# Patient Record
Sex: Female | Born: 1970 | Race: White | Hispanic: No | Marital: Married | Smoking: Former smoker
Health system: Southern US, Community
[De-identification: ages and names within clinical notes are randomized; demographics above are authoritative.]

## PROBLEM LIST (undated history)

## (undated) DIAGNOSIS — F419 Anxiety disorder, unspecified: Secondary | ICD-10-CM

## (undated) DIAGNOSIS — F32A Depression, unspecified: Secondary | ICD-10-CM

## (undated) DIAGNOSIS — I10 Essential (primary) hypertension: Secondary | ICD-10-CM

## (undated) DIAGNOSIS — D649 Anemia, unspecified: Secondary | ICD-10-CM

## (undated) DIAGNOSIS — F329 Major depressive disorder, single episode, unspecified: Secondary | ICD-10-CM

## (undated) DIAGNOSIS — F1010 Alcohol abuse, uncomplicated: Secondary | ICD-10-CM

## (undated) DIAGNOSIS — J45909 Unspecified asthma, uncomplicated: Secondary | ICD-10-CM

## (undated) DIAGNOSIS — B159 Hepatitis A without hepatic coma: Secondary | ICD-10-CM

## (undated) HISTORY — DX: Essential (primary) hypertension: I10

## (undated) HISTORY — DX: Depression, unspecified: F32.A

## (undated) HISTORY — DX: Anxiety disorder, unspecified: F41.9

## (undated) HISTORY — PX: UPPER GASTROINTESTINAL ENDOSCOPY: SHX188

## (undated) HISTORY — PX: TONSILLECTOMY AND ADENOIDECTOMY: SUR1326

## (undated) HISTORY — DX: Major depressive disorder, single episode, unspecified: F32.9

## (undated) HISTORY — PX: BREAST LUMPECTOMY: SHX2

## (undated) HISTORY — DX: Alcohol abuse, uncomplicated: F10.10

---

## 2004-12-10 ENCOUNTER — Other Ambulatory Visit: Admission: RE | Admit: 2004-12-10 | Discharge: 2004-12-10 | Payer: Self-pay | Admitting: Obstetrics and Gynecology

## 2005-11-10 ENCOUNTER — Encounter: Admission: RE | Admit: 2005-11-10 | Discharge: 2005-11-10 | Payer: Self-pay | Admitting: Obstetrics and Gynecology

## 2005-11-23 ENCOUNTER — Encounter: Admission: RE | Admit: 2005-11-23 | Discharge: 2005-11-23 | Payer: Self-pay | Admitting: General Surgery

## 2005-11-23 ENCOUNTER — Encounter (INDEPENDENT_AMBULATORY_CARE_PROVIDER_SITE_OTHER): Payer: Self-pay | Admitting: *Deleted

## 2005-12-21 ENCOUNTER — Other Ambulatory Visit: Admission: RE | Admit: 2005-12-21 | Discharge: 2005-12-21 | Payer: Self-pay | Admitting: Obstetrics and Gynecology

## 2006-01-07 ENCOUNTER — Ambulatory Visit: Payer: Self-pay | Admitting: Internal Medicine

## 2006-01-14 ENCOUNTER — Ambulatory Visit (HOSPITAL_BASED_OUTPATIENT_CLINIC_OR_DEPARTMENT_OTHER): Admission: RE | Admit: 2006-01-14 | Discharge: 2006-01-14 | Payer: Self-pay | Admitting: General Surgery

## 2006-01-14 ENCOUNTER — Encounter: Admission: RE | Admit: 2006-01-14 | Discharge: 2006-01-14 | Payer: Self-pay | Admitting: General Surgery

## 2006-01-14 ENCOUNTER — Encounter (INDEPENDENT_AMBULATORY_CARE_PROVIDER_SITE_OTHER): Payer: Self-pay | Admitting: Specialist

## 2006-02-19 ENCOUNTER — Ambulatory Visit: Payer: Self-pay | Admitting: Internal Medicine

## 2007-06-28 ENCOUNTER — Encounter: Payer: Self-pay | Admitting: *Deleted

## 2007-06-28 DIAGNOSIS — I10 Essential (primary) hypertension: Secondary | ICD-10-CM | POA: Insufficient documentation

## 2008-05-22 ENCOUNTER — Emergency Department (HOSPITAL_COMMUNITY): Admission: EM | Admit: 2008-05-22 | Discharge: 2008-05-22 | Payer: Self-pay | Admitting: Emergency Medicine

## 2009-06-20 ENCOUNTER — Encounter: Payer: Self-pay | Admitting: Family Medicine

## 2009-06-26 ENCOUNTER — Ambulatory Visit: Payer: Self-pay | Admitting: Family Medicine

## 2009-06-26 DIAGNOSIS — J309 Allergic rhinitis, unspecified: Secondary | ICD-10-CM | POA: Insufficient documentation

## 2009-06-26 DIAGNOSIS — F1010 Alcohol abuse, uncomplicated: Secondary | ICD-10-CM | POA: Insufficient documentation

## 2009-06-26 DIAGNOSIS — R5383 Other fatigue: Secondary | ICD-10-CM | POA: Insufficient documentation

## 2009-07-03 ENCOUNTER — Encounter: Payer: Self-pay | Admitting: Family Medicine

## 2009-07-25 ENCOUNTER — Ambulatory Visit: Payer: Self-pay | Admitting: Family Medicine

## 2009-07-25 DIAGNOSIS — R319 Hematuria, unspecified: Secondary | ICD-10-CM | POA: Insufficient documentation

## 2009-07-25 LAB — CONVERTED CEMR LAB
Bilirubin Urine: NEGATIVE
Glucose, Urine, Semiquant: NEGATIVE
Ketones, urine, test strip: NEGATIVE
Nitrite: NEGATIVE
Specific Gravity, Urine: <1.005
Urobilinogen, UA: 0.2
pH: 7

## 2009-07-26 ENCOUNTER — Encounter: Payer: Self-pay | Admitting: Family Medicine

## 2009-08-20 ENCOUNTER — Encounter (INDEPENDENT_AMBULATORY_CARE_PROVIDER_SITE_OTHER): Payer: Self-pay | Admitting: *Deleted

## 2009-08-23 ENCOUNTER — Encounter (INDEPENDENT_AMBULATORY_CARE_PROVIDER_SITE_OTHER): Payer: Self-pay | Admitting: *Deleted

## 2009-08-27 ENCOUNTER — Telehealth: Payer: Self-pay | Admitting: Family Medicine

## 2010-01-01 ENCOUNTER — Ambulatory Visit: Payer: Self-pay | Admitting: Family Medicine

## 2010-01-01 DIAGNOSIS — R6883 Chills (without fever): Secondary | ICD-10-CM | POA: Insufficient documentation

## 2010-01-01 LAB — CONVERTED CEMR LAB
Bilirubin Urine: NEGATIVE
Blood in Urine, dipstick: NEGATIVE
Glucose, Urine, Semiquant: NEGATIVE
Nitrite: NEGATIVE
Specific Gravity, Urine: 1.01
Urobilinogen, UA: 0.2
WBC Urine, dipstick: NEGATIVE
pH: 8.5

## 2010-01-23 ENCOUNTER — Encounter: Admission: RE | Admit: 2010-01-23 | Discharge: 2010-01-23 | Payer: Self-pay | Admitting: Obstetrics and Gynecology

## 2010-01-30 ENCOUNTER — Encounter: Admission: RE | Admit: 2010-01-30 | Discharge: 2010-01-30 | Payer: Self-pay | Admitting: Obstetrics and Gynecology

## 2010-04-05 LAB — HM MAMMOGRAPHY: HM Mammogram: NEGATIVE

## 2010-05-12 ENCOUNTER — Telehealth: Payer: Self-pay | Admitting: Family Medicine

## 2010-09-05 LAB — HM PAP SMEAR: HM Pap smear: NEGATIVE

## 2010-11-04 NOTE — Assessment & Plan Note (Signed)
Summary: BLOOD IN URINE//SLM   Vital Signs:  Patient profile:   40 year old female Menstrual status:  regular Temp:     97.5 degrees F oral BP sitting:   150 / 90  (left arm) Cuff size:   regular  Vitals Entered By: Sid Falcon LPN (July 25, 2009 10:07 AM) CC: Blood in urine today   History of Present Illness: Chief complaint is gross hematuria which she noticed couple days ago. She's had some nonspecific symptoms of achiness and malaise. Similar symptoms in the past with urinary infection. She has not had any urine frequency or burning with urination and denies any nausea, vomiting, fever, or back pain. No suprapubic pain. No history of kidney stones. No history of bruising or other bleeding complications.  Recently started on several allergy medications. These are reviewed. History of allergy to sulfa and Macrobid per patient.  Allergies: 1)  ! Sulfa  Past History:  Past Medical History: Last updated: 06/26/2009 Anemia Alcohol/Drug problem Hay fever, allergies Hepatitis/Jaundice  (infant)  Review of Systems      See HPI  Physical Exam  General:  Well-developed,well-nourished,in no acute distress; alert,appropriate and cooperative throughout examination Mouth:  Oral mucosa and oropharynx without lesions or exudates.  Teeth in good repair. Lungs:  Normal respiratory effort, chest expands symmetrically. Lungs are clear to auscultation, no crackles or wheezes. Heart:  Normal rate and regular rhythm. S1 and S2 normal without gallop, murmur, click, rub or other extra sounds. Msk:  no CVA tenderness   Impression & Recommendations:  Problem # 1:  HEMATURIA UNSPECIFIED (ICD-599.70)  obtain urine culture. Empirically start antibiotic though not clear that this is urinary infection related. If culture negative will need further urologic workup. Her updated medication list for this problem includes:    Metronidazole 500 Mg Tabs (Metronidazole) .Marland Kitchen..Marland Kitchen Two times a day  Ciprofloxacin Hcl 250 Mg Tabs (Ciprofloxacin hcl) ..... One by mouth two times a day for 5 days  Orders: UA Dipstick w/o Micro (manual) (16109) T-Urine Culture (Spectrum Order) [id number])  Complete Medication List: 1)  Benazepril-hydrochlorothiazide 20-25 Mg Tabs (Benazepril-hydrochlorothiazide) .... Once daily 2)  Lexapro 10 Mg Tabs (Escitalopram oxalate) .... 1/2  as needed 3)  Clonazepam 1 Mg Tabs (Clonazepam) .Marland Kitchen.. 1 to 1/2 as needed 4)  Metronidazole 500 Mg Tabs (Metronidazole) .... Two times a day 5)  Advair Hfa 115-21 Mcg/act Aero (Fluticasone-salmeterol) .... 2 sprays to each nostril daily 6)  Mucinex 600 Mg Xr12h-tab (Guaifenesin) .... As needed 7)  Claritin 10 Mg Tabs (Loratadine) .... Once daily 8)  Fluticasone Propionate 50 Mcg/act Susp (Fluticasone propionate) .... As needed 9)  Ciprofloxacin Hcl 250 Mg Tabs (Ciprofloxacin hcl) .... One by mouth two times a day for 5 days  Patient Instructions: 1)  Drink lots of fluids. 2)  We will call regarding urine culture results by Monday. If culture negative we will consider urology referral. Prescriptions: CIPROFLOXACIN HCL 250 MG TABS (CIPROFLOXACIN HCL) one by mouth two times a day for 5 days  #10 x 0   Entered and Authorized by:   Evelena Peat MD   Signed by:   Evelena Peat MD on 07/25/2009   Method used:   [facility] [address] [phone]* (retail)       [address] [place] [address]       [place], [place]  [address]       Ph: [phone] or [phone]  Fax: [phone]   RxID:   [id number]   Laboratory Results   Urine Tests    Routine Urinalysis   Color: yellow Appearance: Clear Glucose: negative   (Normal Range: Negative) Bilirubin: negative   (Normal Range: Negative) Ketone: negative   (Normal Range: Negative) Spec. Gravity: <1.005   (Normal Range: 1.003-1.035) Blood: large   (Normal Range: Negative) pH: 7.0   (Normal Range: 5.0-8.0) Protein: trace   (Normal  Range: Negative) Urobilinogen: 0.2   (Normal Range: 0-1) Nitrite: negative   (Normal Range: Negative) Leukocyte Esterace: trace   (Normal Range: Negative)

## 2010-11-04 NOTE — Assessment & Plan Note (Signed)
Summary: NEW PT/REFERRED/RCD   Vital Signs:  Patient profile:   40 year old female Menstrual status:  regular LMP:     06/05/2009 Height:      65.5 inches Weight:      136 pounds BMI:     22.37 Temp:     98.6 degrees F oral Pulse rate:   100 / minute Pulse rhythm:   regular Resp:     12 per minute BP sitting:   120 / 70  (left arm) Cuff size:   regular  Vitals Entered By: Sid Falcon LPN (June 26, 2009 3:52 PM) CC: Referral from Dr Huntley Dec, STD questions, not feeling well X 2 years LMP (date): 06/05/2009     Menstrual Status regular Enter LMP: 06/05/2009   History of Present Illness: New patient to establish care. Previously seen a local urgent care. Gives a two-year history of multiple progressive symptoms including watery itchy eyes, sore throat, mostly nonproductive cough, nasal congestion, fatigue, and recurrent sinus infections. 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 She suspects she may be allergic to something in the building. She has never had any allergy testing. She has taken multiple antihistamines and nasal steroids without much improvement.  Other medical problems include history of hypertension, excessive alcohol use, remote history of anemia, history of mild depression. She had lumpectomy 2007 for benign mass. Allergy to sulfa and Macrobid.  Patient smokes one pack of cigarettes per day. Excessive alcohol use with frequently consuming wine, beer, and bourbon in the same day. She desires to scale back and hopefully quit.  Preventive Screening-Counseling & Management  Alcohol-Tobacco     Smoking Status: current  Allergies: 1)  ! Sulfa  Past History:  Family History: Last updated: 06/26/2009 Family History Depression Family History of Alcoholism/Addiction Family History of Arthritis Family History of Stroke M 1st degree relative <50 Family History Emotional/Mental Illness  Social History: Last updated:  06/26/2009 Occupation:  Paralegal Married Current Smoker Alcohol use-yes  Risk Factors: Smoking Status: current (06/26/2009)  Past Medical History: Anemia Alcohol/Drug problem Hay fever, allergies Hepatitis/Jaundice  (infant)  Past Surgical History: Lumpectomy 2007  Family History: Family History Depression Family History of Alcoholism/Addiction Family History of Arthritis Family History of Stroke M 1st degree relative <50 Family History Emotional/Mental Illness  Social History: Occupation:  IT consultant Married Current Smoker Alcohol use-yes Occupation:  employed  Review of Systems  The patient denies anorexia, fever, weight loss, weight gain, hoarseness, chest pain, syncope, peripheral edema, hemoptysis, abdominal pain, melena, and hematochezia.    Physical Exam  General:  Well-developed,well-nourished,in no acute distress; alert,appropriate and cooperative throughout examination Eyes:  No corneal or conjunctival inflammation noted. EOMI. Perrla. Funduscopic exam benign, without hemorrhages, exudates or papilledema. Vision grossly normal. Ears:  External ear exam shows no significant lesions or deformities.  Otoscopic examination reveals clear canals, tympanic membranes are intact bilaterally without bulging, retraction, inflammation or discharge. Hearing is grossly normal bilaterally. Mouth:  poor dentition.  Nicotine staining of teeth.  Prominant papillae tongue from tobacco use but no lesions. Neck:  No deformities, masses, or tenderness noted. Lungs:  Normal respiratory effort, chest expands symmetrically. Lungs are clear to auscultation, no crackles or wheezes. Heart:  Normal rate and regular rhythm. S1 and S2 normal without gallop, murmur, click, rub or other extra sounds. Abdomen:  soft, non-tender, no distention, no masses, no guarding, no rebound tenderness, no hepatomegaly, and no splenomegaly.   Extremities:  no edema. Neurologic:  alert & oriented  X3, cranial  nerves II-XII intact, strength normal in all extremities, and gait normal.   Psych:  Oriented X3, normally interactive, not depressed appearing, and not agitated.     Impression & Recommendations:  Problem # 1:  ALLERGIC RHINITIS (ICD-477.9) possible occupational exposure. Recommend referral to allergist to help sort out possible allergens. She has multiple ongoing symptoms in spite of medication. Orders: Allergy Referral  (Allergy)  Her updated medication list for this problem includes:    Claritin 10 Mg Tabs (Loratadine) ..... Once daily    Fluticasone Propionate 50 Mcg/act Susp (Fluticasone propionate) .Marland Kitchen... As needed  Problem # 2:  FATIGUE (ICD-780.79) Per patient recent lab work done through urgent care all with chest x-ray and EKG we will send for those old records.  Problem # 3:  ALCOHOL ABUSE (ICD-305.00) long discussion with the husband and patient regarding adverse effects of excessive alcohol consumption. She is aware that she must discontinue use altogether. We've offered any support.  Complete Medication List: 1)  Benazepril-hydrochlorothiazide 20-25 Mg Tabs (Benazepril-hydrochlorothiazide) .... Once daily 2)  Lexapro 10 Mg Tabs (Escitalopram oxalate) .... 1/2  as needed 3)  Clonazepam 1 Mg Tabs (Clonazepam) .Marland Kitchen.. 1 to 1/2 as needed 4)  Metronidazole 500 Mg Tabs (Metronidazole) .... Two times a day 5)  Advair Hfa 115-21 Mcg/act Aero (Fluticasone-salmeterol) .... 2 sprays to each nostril daily 6)  Mucinex 600 Mg Xr12h-tab (Guaifenesin) .... As needed 7)  Claritin 10 Mg Tabs (Loratadine) .... Once daily 8)  Fluticasone Propionate 50 Mcg/act Susp (Fluticasone propionate) .... As needed  Patient Instructions: 1)  You need to stop alcohol consumption altogether. 2)  Please schedule a follow-up appointment in 1 month.   Preventive Care Screening     Mammogram 2007

## 2010-11-04 NOTE — Assessment & Plan Note (Signed)
Summary: UTI/dm   Vital Signs:  Patient profile:   40 year old female Menstrual status:  regular Temp:     97.7 degrees F oral BP sitting:   130 / 90  (left arm) Cuff size:   regular  Vitals Entered By: Sid Falcon LPN (January 01, 2010 2:00 PM) CC: UTI symptoms X 2 days   History of Present Illness: Patient seen with nonspecific symptoms of possible chills and low-grade fever last night. Generalized malaise. Concern for possible UTI though she has not had any urinary symptoms of frequency or burning. Denies any sore throat, sinus congestion, earache, abdominal pain, rash, nausea, vomiting, or diarrhea. Rare cough. No dyspnea.  Scalling back on nicotine.  Has greatly reduced ETOH recently but still drinking 2 alcohol beverages/day.  Allergies: 1)  ! Sulfa 2)  Macrobid (Nitrofurantoin Monohyd Macro)  Past History:  Past Medical History: Last updated: 06/26/2009 Anemia Alcohol/Drug problem Hay fever, allergies Hepatitis/Jaundice  (infant)  Review of Systems  The patient denies anorexia, weight loss, weight gain, prolonged cough, headaches, hemoptysis, and abdominal pain.    Physical Exam  General:  Well-developed,well-nourished,in no acute distress; alert,appropriate and cooperative throughout examination Ears:  External ear exam shows no significant lesions or deformities.  Otoscopic examination reveals clear canals, tympanic membranes are intact bilaterally without bulging, retraction, inflammation or discharge. Hearing is grossly normal bilaterally. Nose:  External nasal examination shows no deformity or inflammation. Nasal mucosa are pink and moist without lesions or exudates. Mouth:  Oral mucosa and oropharynx without lesions or exudates.  Teeth in good repair. Neck:  No deformities, masses, or tenderness noted. Lungs:  Normal respiratory effort, chest expands symmetrically. Lungs are clear to auscultation, no crackles or wheezes. Heart:  Normal rate and regular  rhythm. S1 and S2 normal without gallop, murmur, click, rub or other extra sounds. Abdomen:  soft, non-tender, normal bowel sounds, no distention, no masses, no guarding, no rigidity, no hepatomegaly, and no splenomegaly.   Skin:  Intact without suspicious lesions or rashes Cervical Nodes:  No lymphadenopathy noted Psych:  slightly anxious.     Impression & Recommendations:  Problem # 1:  CHILLS WITHOUT FEVER (ICD-780.64)  no evidence for urinary tract infection. No obvious respiratory symptoms. Observe for now.  Monitor temps and prompt f/u if any fever or worsening symptoms.  Orders: UA Dipstick w/o Micro (manual) (09811)  Problem # 2:  ALCOHOL ABUSE (ICD-305.00) discussed importance of total abstinence if she has difficulty with control and she expresses understanding.  Complete Medication List: 1)  Benazepril-hydrochlorothiazide 20-25 Mg Tabs (Benazepril-hydrochlorothiazide) .... Once daily 2)  Lexapro 10 Mg Tabs (Escitalopram oxalate) .... 1/2  tab once daily 3)  Clonazepam 1 Mg Tabs (Clonazepam) .Marland Kitchen.. 1 to 1/2 as needed 4)  Metronidazole 500 Mg Tabs (Metronidazole) .... Two times a day 5)  Advair Hfa 115-21 Mcg/act Aero (Fluticasone-salmeterol) .... 2 sprays to each nostril daily 6)  Mucinex 600 Mg Xr12h-tab (Guaifenesin) .... As needed 7)  Zyrtec Allergy 10 Mg Caps (Cetirizine hcl) .... Once daily 8)  Fluticasone Propionate 50 Mcg/act Susp (Fluticasone propionate) .... As needed 9)  Ciprofloxacin Hcl 250 Mg Tabs (Ciprofloxacin hcl) .... One by mouth two times a day for 5 days  Patient Instructions: 1)  Continue to scale back alcohol use. 2)  Monitor temperature over the next few days and followup promptly if you develop fever of 101 or higher or if worsening symptoms  Laboratory Results   Urine Tests    Routine Urinalysis   Color:  yellow Appearance: Clear Glucose: negative   (Normal Range: Negative) Bilirubin: negative   (Normal Range: Negative) Ketone: trace (5)    (Normal Range: Negative) Spec. Gravity: 1.010   (Normal Range: 1.003-1.035) Blood: negative   (Normal Range: Negative) pH: 8.5   (Normal Range: 5.0-8.0) Protein: 1+   (Normal Range: Negative) Urobilinogen: 0.2   (Normal Range: 0-1) Nitrite: negative   (Normal Range: Negative) Leukocyte Esterace: negative   (Normal Range: Negative)    Comments: Rita Ohara  January 01, 2010 1:58 PM

## 2010-11-04 NOTE — Letter (Signed)
Summary: Out of Work  Adult nurse at [employer]  [address]   [place], [place] [address]   Phone: [phone]  Fax: [phone]    January 01, 2010   Employee:  ODA PLACKE    To Whom It May Concern:   For Medical reasons, please excuse the above named employee from work for the following dates:  Start:   01-01-10  End:   01-03-10  If you need additional information, please feel free to contact our office.         Sincerely,    Evelena Peat MD

## 2010-11-04 NOTE — Letter (Signed)
Summary: [facility]  [facility]   Imported By: Maryln Gottron 07/25/2009 17:06:00  _____________________________________________________________________  External Attachment:    Type:   Image     Comment:   External Document

## 2010-11-04 NOTE — Letter (Signed)
Summary: Out of Work  Adult nurse at [employer]  [address]   [place], [place] [address]   Phone: [phone]  Fax: [phone]    July 25, 2009   Employee:  Natalie Morrison    To Whom It May Concern:   For Medical reasons, please excuse the above named employee from work for the following dates:  Start:   07/25/09  End:   07/25/09  If you need additional information, please feel free to contact our office.         Sincerely,        Evelena Peat, MD

## 2010-11-04 NOTE — Progress Notes (Signed)
Summary: Pt complete out of Benazepril HCTZ. Pls call in  Phone Note Refill Request   Refills Requested: Medication #1:  BENAZEPRIL-HYDROCHLOROTHIAZIDE 20-25 MG TABS once daily   Dosage confirmed as above?Dosage Confirmed [facility]   Method Requested: Telephone to Pharmacy Initial call taken by: Lucy Antigua,  May 12, 2010 3:03 PM    Prescriptions: BENAZEPRIL-HYDROCHLOROTHIAZIDE 20-25 MG TABS (BENAZEPRIL-HYDROCHLOROTHIAZIDE) once daily  #30 x 3   Entered by:   Sid Falcon LPN   Authorized by:   Evelena Peat MD   Signed by:   Sid Falcon LPN on 16/07/9603   Method used:   [facility]ronically [facility]        [facility] [address] [phone]* (retail)       [address]       [place], [place]  [address]       Ph: [phone] or [phone]       Fax: [phone]   RxID:   [id number]

## 2010-11-04 NOTE — Letter (Signed)
Summary: [facility]  [facility] Tyler Cubit: This Alvetta Hidrogo was preselected by the workflow.  Signature: The signature status of this document was preset by the workflow  Processed by [employer] Local Indexer Client @ Wednesday, August 28, 2009 2:25:21 PM using version:2010.1.2.11(2.4)   Manually Indexed By: [id number]  idlBatchDetail: [id number]   _____________________________________________________________________  External Attachment:    Type:   Image     Comment:   External Document

## 2010-11-04 NOTE — Consult Note (Signed)
Summary: [facility]  [facility] Provider: This provider was preselected by the workflow.  Signature: The signature status of this document was preset by the workflow  Processed by [employer] Local Indexer Client @ Monday, August 26, 2009 1:05:58 PM using version:2010.1.2.11(2.4)   Manually Indexed By: [id number]  idlBatchDetail: [id number]   _____________________________________________________________________  External Attachment:    Type:   Image     Comment:   External Document

## 2010-11-04 NOTE — Progress Notes (Signed)
Summary: NEW RX NEEDED Lexapro  Phone Note Call from Patient Call back at Work Phone [phone]   Caller: Patient-LIVE CALL Summary of Call: ON SAMPLES OF LEXAPRO 10MG  1/2QD. PLEASE CALL IN A NEW RX TO [facility]. Initial call taken by: Warnell Forester,  August 27, 2009 1:08 PM  Follow-up for Phone Call        OK  to refill for 6 months. Follow-up by: Evelena Peat MD,  August 27, 2009 1:13 PM  Additional Follow-up for Phone Call Additional follow up Details #1::        Pt informed Additional Follow-up by: Sid Falcon LPN,  August 28, 2009 8:35 AM    New/Updated Medications: LEXAPRO 10 MG TABS (ESCITALOPRAM OXALATE) 1/2  tab once daily Prescriptions: LEXAPRO 10 MG TABS (ESCITALOPRAM OXALATE) 1/2  tab once daily  #30 x 6   Entered by:   Sid Falcon LPN   Authorized by:   Evelena Peat MD   Signed by:   Sid Falcon LPN on 09/81/1914   Method used:   Electronically to        [facility] [address] [phone]* (retail)       [address]       [place], [place]  [address]       Ph: [phone] or [phone]       Fax: [phone]   RxID:   [id number]

## 2010-11-25 ENCOUNTER — Encounter: Payer: Self-pay | Admitting: Family Medicine

## 2010-11-25 ENCOUNTER — Ambulatory Visit (INDEPENDENT_AMBULATORY_CARE_PROVIDER_SITE_OTHER): Payer: Self-pay | Admitting: Family Medicine

## 2010-11-25 VITALS — BP 140/90 | Temp 98.0°F | Ht 66.0 in | Wt 141.0 lb

## 2010-11-25 DIAGNOSIS — M7060 Trochanteric bursitis, unspecified hip: Secondary | ICD-10-CM

## 2010-11-25 DIAGNOSIS — R300 Dysuria: Secondary | ICD-10-CM

## 2010-11-25 DIAGNOSIS — M549 Dorsalgia, unspecified: Secondary | ICD-10-CM

## 2010-11-25 DIAGNOSIS — M76899 Other specified enthesopathies of unspecified lower limb, excluding foot: Secondary | ICD-10-CM

## 2010-11-25 LAB — POCT URINALYSIS DIPSTICK
Bilirubin, UA: NEGATIVE
Glucose, UA: NEGATIVE
Ketones, UA: NEGATIVE
Leukocytes, UA: NEGATIVE
Nitrite, UA: NEGATIVE
Protein, UA: NEGATIVE
Spec Grav, UA: 1.01
Urobilinogen, UA: 0.2
pH, UA: 6

## 2010-11-25 NOTE — Patient Instructions (Signed)
Ice R lateral hip 20-30 minutes two to three times daily.

## 2010-11-25 NOTE — Progress Notes (Signed)
  Subjective:    Patient ID: Natalie Morrison, female    DOB: 27-Apr-1971, 40 y.o.   MRN: [id number]  HPI  Patient seen with onset Sunday about 3 days ago of some right lateral hip pain. No low back pain. No injury. Pain has worsened since onset. Pain is deep achy quality located over the greater trochanteric region. No visible ecchymosis or erythema. Patient denies any fever but has felt somewhat warm at times and possibly some mild chills.  Decrease frequency of urination but no burning with urination. Denies any vomiting or diarrhea. Prior history of gross hematuria fully evaluated by  Urology with no clear etiology. Consideration that she might have passed a kidney stone previously.   Review of Systems As above.  Denies any abdominal pain, chest pain, cough.    Objective:   Physical Exam     Patient is alert and in no distress. She is afebrile Oropharynx is moist and clear Neck supple no adenopathy Chest clear to auscultation Heart regular rhythm and rate Abdomen soft nontender  Back no CVA tenderness Extremities full range of motion right hip with internal and external rotation. No leg edema. Tender over greater trochanteric bursa region. No erythema or warmth.  Neuro exam strength is full right lower extremity. Deep tendon reflexes are symmetric.    Assessment & Plan:  R greater trochanteric bursitis.  Discussed options.  Not a good candidate for NSAID use secondary to ETOH use. Discussed risks and benefits of steroid injection into bursa and pt consented.  Prepped skin with betadine and using sterile technique, injected 2 cc plain xylocaine and 40 mg depomedrol into bursa sack (greater trochanteric bursa).  Pt tolerated well.

## 2010-12-18 ENCOUNTER — Other Ambulatory Visit: Payer: Self-pay | Admitting: Family Medicine

## 2011-01-03 ENCOUNTER — Inpatient Hospital Stay (INDEPENDENT_AMBULATORY_CARE_PROVIDER_SITE_OTHER)
Admission: RE | Admit: 2011-01-03 | Discharge: 2011-01-03 | Disposition: A | Discharge status: Home / Self Care | Payer: Self-pay | Source: Ambulatory Visit | Attending: Emergency Medicine | Admitting: Emergency Medicine

## 2011-01-03 DIAGNOSIS — M545 Low back pain, unspecified: Secondary | ICD-10-CM

## 2011-02-16 ENCOUNTER — Other Ambulatory Visit: Payer: Self-pay | Admitting: Obstetrics and Gynecology

## 2011-02-20 NOTE — Op Note (Signed)
Natalie Morrison, Natalie Morrison                ACCOUNT NO.:  [id number]   MEDICAL RECORD NO.:  [id number]          PATIENT TYPE:  AMB   LOCATION:  [facility]                          FACILITY:  [facility]   PHYSICIAN:  Ollen Gross. Vernell Morgans, M.D. DATE OF BIRTH:  [birth year]   DATE OF PROCEDURE:  01/14/2006  DATE OF DISCHARGE:                                 OPERATIVE REPORT   PREOPERATIVE DIAGNOSIS:  Right breast mass.   POSTOPERATIVE DIAGNOSIS:  Right breast mass.   PROCEDURE:  Right breast needle-localized lumpectomy.   SURGEON:  Ollen Gross. Carolynne Edouard, M.D.   ANESTHESIA:  General via LMA.   PROCEDURE:  After informed consent was obtained, the patient was brought to  the operating and placed in the supine position on the operating table.  After adequate induction of general anesthesia, the patient's right breast  was prepped with Betadine and draped in the usual sterile manner.  The  patient, earlier in the day, had undergone a needle localization procedure.  The mass was in the lateral aspect of the breast and the needle was entering  the breast inferiorly.  The skin overlying the mass was marked with an X.  A  transverse incision was made with a 15 blade knife overlying this area on  the lateral aspect of the breast.  This incision was carried down through  the skin and subcutaneous tissue sharply with the electrocautery.  The area  around the end of the wire was excised sharply in a circumferential manner  with electrocautery. Once this was done circumferentially, then the mass  with the wire was removed from the patient.  The specimen was sent to  radiology where an x-ray showed the mass to be within the specimen.  The  wound was irrigated with copious amounts of saline.  Hemostasis was achieved  using the Bovie electrocautery.  The wound was then closed with a deep layer  of interrupted 3-0 Vicryl stitches and the skin was closed a running 4-0  Monocryl subcuticular stitch.  Benzoin, Steri-Strips, and sterile  dressings  were applied.  The patient tolerated well.  At the end of the case, all  needle, sponge, and instrument counts were correct.  The patient was then  awakened and taken to the recovery room in stable condition.      Ollen Gross. Vernell Morgans, M.D.  Electronically Signed     PST/MEDQ  D:  01/15/2006  T:  01/15/2006  Job:  [id number]

## 2011-03-15 ENCOUNTER — Other Ambulatory Visit: Payer: Self-pay | Admitting: Family Medicine

## 2011-04-07 ENCOUNTER — Other Ambulatory Visit (HOSPITAL_COMMUNITY): Payer: Self-pay | Admitting: Obstetrics and Gynecology

## 2011-04-07 ENCOUNTER — Ambulatory Visit (INDEPENDENT_AMBULATORY_CARE_PROVIDER_SITE_OTHER): Payer: Self-pay | Admitting: Internal Medicine

## 2011-04-07 ENCOUNTER — Encounter: Payer: Self-pay | Admitting: Internal Medicine

## 2011-04-07 DIAGNOSIS — J45909 Unspecified asthma, uncomplicated: Secondary | ICD-10-CM

## 2011-04-07 DIAGNOSIS — Z1231 Encounter for screening mammogram for malignant neoplasm of breast: Secondary | ICD-10-CM

## 2011-04-07 DIAGNOSIS — J45901 Unspecified asthma with (acute) exacerbation: Secondary | ICD-10-CM | POA: Insufficient documentation

## 2011-04-07 MED ORDER — METHYLPREDNISOLONE ACETATE 40 MG/ML IJ SUSP
40.0000 mg | Freq: Once | INTRAMUSCULAR | Status: AC
  Administered 2011-04-07: 40 mg via INTRAMUSCULAR

## 2011-04-07 MED ORDER — ALBUTEROL SULFATE (2.5 MG/3ML) 0.083% IN NEBU: 2.5000 mg | INHALATION_SOLUTION | RESPIRATORY_TRACT | Status: DC

## 2011-04-07 MED ORDER — AMOXICILLIN 875 MG PO TABS: 875.0000 mg | ORAL_TABLET | Freq: Two times a day (BID) | ORAL | Status: AC

## 2011-04-07 NOTE — Assessment & Plan Note (Signed)
Likely contribution from URI. Begin amoxicillin x 7days. Given depomedrol IM injxn and albuterol neb x one. Continue albuterol mdi prn. Work note provided 7/3-7/5. Followup if no improvement or worsening.

## 2011-04-07 NOTE — Progress Notes (Signed)
  Subjective:    Patient ID: Natalie Morrison, female    DOB: 1971-02-02, 40 y.o.   MRN: [id number]  HPI Pt presents to clinic for evaluation of cough and possible asthma flare. Notes 3 day h/o productive cough without hemoptysis, chills without fever and mild dyspnea/wheezing. Has used albuterol mdi ~3-4 times a day for the last two days. Notes + sick exposure. No other exacerbating or alleviating factors. No other complaints.  Reviewed pmh, medications and allergies.    Review of Systems see hpi     Objective:   Physical Exam  Nursing note and vitals reviewed. Constitutional: She appears well-developed and well-nourished. No distress.  HENT:  Head: Normocephalic and atraumatic.  Right Ear: Tympanic membrane, external ear and ear canal normal.  Left Ear: Tympanic membrane, external ear and ear canal normal.  Nose: Nose normal.  Mouth/Throat: Oropharynx is clear and moist. No oropharyngeal exudate.  Eyes: Conjunctivae are normal. No scleral icterus.  Neck: Neck supple. No JVD present.  Pulmonary/Chest: Effort normal. No accessory muscle usage. Not tachypneic. No respiratory distress. She has wheezes. She has no rales.       Slight transient bilateral mid lung field wheeze  Lymphadenopathy:    She has no cervical adenopathy.  Neurological: She is alert.  Skin: Skin is warm and dry. No rash noted. She is not diaphoretic. No erythema.  Psychiatric: She has a normal mood and affect.          Assessment & Plan:

## 2011-04-10 ENCOUNTER — Telehealth: Payer: Self-pay | Admitting: *Deleted

## 2011-04-10 NOTE — Telephone Encounter (Signed)
Was placed on 7d course of amox.

## 2011-04-10 NOTE — Telephone Encounter (Signed)
Per Dr. Rodena Medin, pt should give amox more time to take effect.She does note that she feels better today. Advised pt that interaction check shows a high interaction between z-pak and pt's rx'ed celexa, therefore Dr. Rodena Medin refuses to rx z-pak for pt. Pt states that she will monitor progress over the weekend and call Monday if she doesn't feel better

## 2011-04-10 NOTE — Telephone Encounter (Signed)
Pt seen on Tuesday for uri, taking antibiotic and not feeling any better.  Was told to call if not any better.  Pt states she normally gets a zpac and is requesting a rx for it.

## 2011-04-30 ENCOUNTER — Ambulatory Visit (HOSPITAL_COMMUNITY)
Admission: RE | Admit: 2011-04-30 | Discharge: 2011-04-30 | Disposition: A | Discharge status: Home / Self Care | Payer: Self-pay | Source: Ambulatory Visit | Attending: Obstetrics and Gynecology | Admitting: Obstetrics and Gynecology

## 2011-04-30 DIAGNOSIS — Z1231 Encounter for screening mammogram for malignant neoplasm of breast: Secondary | ICD-10-CM

## 2011-07-20 ENCOUNTER — Other Ambulatory Visit: Payer: Self-pay | Admitting: Family Medicine

## 2011-09-15 DIAGNOSIS — R296 Repeated falls: Secondary | ICD-10-CM | POA: Insufficient documentation

## 2011-09-15 DIAGNOSIS — S0990XA Unspecified injury of head, initial encounter: Secondary | ICD-10-CM | POA: Insufficient documentation

## 2011-09-16 ENCOUNTER — Emergency Department (HOSPITAL_COMMUNITY)
Admission: EM | Admit: 2011-09-16 | Discharge: 2011-09-16 | Discharge status: Against Medical Advice | Payer: Self-pay | Attending: Emergency Medicine | Admitting: Emergency Medicine

## 2011-09-16 NOTE — ED Notes (Signed)
PATIENT LEFT AMA 

## 2011-10-21 ENCOUNTER — Other Ambulatory Visit: Payer: Self-pay | Admitting: Family Medicine

## 2011-11-29 ENCOUNTER — Other Ambulatory Visit: Payer: Self-pay | Admitting: Family Medicine

## 2012-01-02 ENCOUNTER — Other Ambulatory Visit: Payer: Self-pay | Admitting: Family Medicine

## 2012-05-04 ENCOUNTER — Other Ambulatory Visit: Payer: Self-pay | Admitting: Family Medicine

## 2012-05-18 ENCOUNTER — Other Ambulatory Visit: Payer: Self-pay | Admitting: Family Medicine

## 2012-06-14 ENCOUNTER — Other Ambulatory Visit: Payer: Self-pay | Admitting: Family Medicine

## 2012-06-16 ENCOUNTER — Encounter (HOSPITAL_COMMUNITY): Payer: Self-pay | Admitting: Emergency Medicine

## 2012-06-16 ENCOUNTER — Emergency Department (INDEPENDENT_AMBULATORY_CARE_PROVIDER_SITE_OTHER)
Admission: EM | Admit: 2012-06-16 | Discharge: 2012-06-16 | Disposition: A | Discharge status: Home / Self Care | Payer: Self-pay | Source: Home / Self Care | Attending: Family Medicine | Admitting: Family Medicine

## 2012-06-16 DIAGNOSIS — N390 Urinary tract infection, site not specified: Secondary | ICD-10-CM

## 2012-06-16 DIAGNOSIS — E860 Dehydration: Secondary | ICD-10-CM

## 2012-06-16 DIAGNOSIS — H81399 Other peripheral vertigo, unspecified ear: Secondary | ICD-10-CM

## 2012-06-16 HISTORY — DX: Unspecified asthma, uncomplicated: J45.909

## 2012-06-16 LAB — POCT URINALYSIS DIP (DEVICE)
Bilirubin Urine: NEGATIVE
Glucose, UA: NEGATIVE mg/dL
Hgb urine dipstick: NEGATIVE
Ketones, ur: 80 mg/dL — AB
Nitrite: NEGATIVE
Protein, ur: NEGATIVE mg/dL
Specific Gravity, Urine: 1.015 (ref 1.005–1.030)
Urobilinogen, UA: 0.2 mg/dL (ref 0.0–1.0)
pH: 7 (ref 5.0–8.0)

## 2012-06-16 LAB — POCT I-STAT, CHEM 8
BUN: 5 mg/dL — ABNORMAL LOW (ref 6–23)
Calcium, Ion: 1.14 mmol/L (ref 1.12–1.23)
Chloride: 91 mEq/L — ABNORMAL LOW (ref 96–112)
Creatinine, Ser: 0.9 mg/dL (ref 0.50–1.10)
Glucose, Bld: 126 mg/dL — ABNORMAL HIGH (ref 70–99)
HCT: 35 % — ABNORMAL LOW (ref 36.0–46.0)
Hemoglobin: 11.9 g/dL — ABNORMAL LOW (ref 12.0–15.0)
Potassium: 3.5 mEq/L (ref 3.5–5.1)
Sodium: 129 mEq/L — ABNORMAL LOW (ref 135–145)
TCO2: 24 mmol/L (ref 0–100)

## 2012-06-16 LAB — CBC WITH DIFFERENTIAL/PLATELET
Basophils Absolute: 0 10*3/uL (ref 0.0–0.1)
Basophils Relative: 0 % (ref 0–1)
Eosinophils Absolute: 0 10*3/uL (ref 0.0–0.7)
Eosinophils Relative: 0 % (ref 0–5)
HCT: 31.8 % — ABNORMAL LOW (ref 36.0–46.0)
Hemoglobin: 11.5 g/dL — ABNORMAL LOW (ref 12.0–15.0)
Lymphocytes Relative: 15 % (ref 12–46)
Lymphs Abs: 1.5 10*3/uL (ref 0.7–4.0)
MCH: 36.4 pg — ABNORMAL HIGH (ref 26.0–34.0)
MCHC: 36.2 g/dL — ABNORMAL HIGH (ref 30.0–36.0)
MCV: 100.6 fL — ABNORMAL HIGH (ref 78.0–100.0)
Monocytes Absolute: 1.4 10*3/uL — ABNORMAL HIGH (ref 0.1–1.0)
Monocytes Relative: 14 % — ABNORMAL HIGH (ref 3–12)
Neutro Abs: 7.4 10*3/uL (ref 1.7–7.7)
Neutrophils Relative %: 72 % (ref 43–77)
Platelets: 273 10*3/uL (ref 150–400)
RBC: 3.16 MIL/uL — ABNORMAL LOW (ref 3.87–5.11)
RDW: 12.4 % (ref 11.5–15.5)
WBC: 10.4 10*3/uL (ref 4.0–10.5)

## 2012-06-16 LAB — POCT PREGNANCY, URINE: Preg Test, Ur: NEGATIVE

## 2012-06-16 MED ORDER — ONDANSETRON HCL 4 MG PO TABS: 4.0000 mg | ORAL_TABLET | Freq: Four times a day (QID) | ORAL | Status: AC

## 2012-06-16 MED ORDER — ONDANSETRON 4 MG PO TBDP
ORAL_TABLET | ORAL | Status: AC
  Filled 2012-06-16: qty 2

## 2012-06-16 MED ORDER — PREDNISONE 20 MG PO TABS: ORAL_TABLET | ORAL | Status: AC

## 2012-06-16 MED ORDER — CEPHALEXIN 500 MG PO CAPS: 500.0000 mg | ORAL_CAPSULE | Freq: Four times a day (QID) | ORAL | Status: AC

## 2012-06-16 MED ORDER — ONDANSETRON 4 MG PO TBDP
8.0000 mg | ORAL_TABLET | Freq: Once | ORAL | Status: AC
  Administered 2012-06-16: 8 mg via ORAL

## 2012-06-16 NOTE — ED Notes (Signed)
Pt tolerating fluid trial ginger ale 8oz without n/v.also reports of Zofran 8mg  ODT effective

## 2012-06-16 NOTE — ED Notes (Signed)
Here with suden onset of nausea that started at 330pm while at work with x6 emesis.states she started a new job with poss viral.denies fever,chills,diarrhea or pain.pt also vomited with ambulating to restroom.

## 2012-06-20 LAB — URINE CULTURE: Colony Count: >=100000

## 2012-06-21 ENCOUNTER — Telehealth (HOSPITAL_COMMUNITY): Payer: Self-pay | Admitting: *Deleted

## 2012-06-21 MED ORDER — CIPROFLOXACIN HCL 500 MG PO TABS: 500.0000 mg | ORAL_TABLET | Freq: Two times a day (BID) | ORAL | Status: DC

## 2012-06-21 NOTE — ED Notes (Signed)
Urine culture: >100,000 colonies Enterobacter Aerogenes.  Pt. treated with Keflex- resistant.  Order changed by Dr. Ladon Applebaum to Cipro 500 mg. 1 tab po BID x 7 days. I called pt. and left a message to call.

## 2012-06-21 NOTE — ED Provider Notes (Signed)
History     CSN: [id number]  Arrival date & time 06/16/12  1627   First MD Initiated Contact with Patient 06/16/12 1628      Chief Complaint  Patient presents with  . Nausea  . Emesis    (Consider location/radiation/quality/duration/timing/severity/associated sxs/prior treatment) HPI Comments: 41 y/o female h/o asthma. Here c/o nausea and dizziness that started about 3 hours ago while she was at work. States she has had about 6 episodes of small foot content emesis. Reports she has had urinary frequency but denies burning on urination. Also reports h/o chronic rhinitis. States she has experienced a "sound" on her right ear for several days and was treated recently for a right side ear infection with ear drops as per her report. Was also treated in July for URI with amoxicillin as per reccords. Denies headache, fever or chills. Denies abdominal pain, or diarrhea. No cough or congestion.     Past Medical History  Diagnosis Date  . Asthma     History reviewed. No pertinent past surgical history.  No family history on file.  History  Substance Use Topics  . Smoking status: Current Every Day Smoker -- 0.5 packs/day for 25 years    Types: Cigarettes  . Smokeless tobacco: Not on file  . Alcohol Use: Not on file    OB History    Grav Para Term Preterm Abortions TAB SAB Ect Mult Living                  Review of Systems  Constitutional: Negative for fever, chills and diaphoresis.  HENT: Positive for tinnitus. Negative for sore throat.   Respiratory: Negative for cough and shortness of breath.   Gastrointestinal: Positive for nausea and vomiting. Negative for diarrhea.  Genitourinary: Positive for frequency. Negative for dysuria, hematuria, flank pain, vaginal bleeding, vaginal discharge and pelvic pain.  Musculoskeletal: Negative for back pain.  Skin: Negative for rash.  Neurological: Positive for dizziness. Negative for headaches.    Allergies  Nitrofurantoin and  Sulfonamide derivatives  Home Medications   Current Outpatient Rx  Name Route Sig Dispense Refill  . ALBUTEROL 90 MCG/ACT IN AERS Inhalation Inhale 2 puffs into the lungs every 6 (six) hours as needed.      Marland Kitchen BENAZEPRIL-HYDROCHLOROTHIAZIDE 20-25 MG PO TABS  TAKE 1 TABLET EVERY DAY 30 tablet 0    Pt will need return OV before any more refills, pl ...  . CEPHALEXIN 500 MG PO CAPS Oral Take 1 capsule (500 mg total) by mouth 4 (four) times daily. 20 capsule 0  . CETIRIZINE HCL 10 MG PO CHEW Oral Chew 10 mg by mouth daily.      Marland Kitchen CIPROFLOXACIN HCL 500 MG PO TABS Oral Take 1 tablet (500 mg total) by mouth every 12 (twelve) hours. 10 tablet 0  . CITALOPRAM HYDROBROMIDE 20 MG PO TABS  TAKE 1/2 TABLET BY MOUTH EVERY DAY 30 tablet 2  . ESCITALOPRAM OXALATE 10 MG PO TABS Oral Take 10 mg by mouth. 1/2 tab once daily     . FLUTICASONE PROPIONATE 50 MCG/ACT NA SUSP Nasal 1 spray by Nasal route daily. As needed     . ONDANSETRON HCL 4 MG PO TABS Oral Take 1 tablet (4 mg total) by mouth every 6 (six) hours. 12 tablet 0  . PREDNISONE 20 MG PO TABS  2 tab po daily for 5 days 10 tablet 0    BP 111/73  Pulse 84  Temp 98.4 F (36.9 C) (Oral)  Resp 18  SpO2 99%  Physical Exam  Nursing note and vitals reviewed. Constitutional: She is oriented to person, place, and time. She appears well-developed and well-nourished. No distress.  HENT:  Head: Normocephalic and atraumatic.  Right Ear: External ear normal.  Left Ear: External ear normal.  Mouth/Throat: No oropharyngeal exudate.       Dry lips. Nasal Congestion with erythema and swelling of nasal turbinates. No pharyngeal erythema no exudates. No uvula deviation. No trismus. TM's right TM with some dullness. Otherwise no significant erythema or swelling.  Left TM normal. Bilateral normal external ear canal.   Eyes: Conjunctivae normal and EOM are normal. Pupils are equal, round, and reactive to light. Right eye exhibits no discharge. Left eye exhibits  no discharge. No scleral icterus.  Neck: Neck supple. No thyromegaly present.  Cardiovascular: Normal rate, regular rhythm and normal heart sounds.   Pulmonary/Chest: Effort normal and breath sounds normal. She has no wheezes. She has no rales.  Abdominal: Soft. Bowel sounds are normal. She exhibits no distension and no mass. There is no tenderness. There is no rebound and no guarding.       No CVT  Lymphadenopathy:    She has no cervical adenopathy.  Neurological: She is alert and oriented to person, place, and time.  Skin: No rash noted.    ED Course  Procedures (including critical care time)  Labs Reviewed  POCT URINALYSIS DIP (DEVICE) - Abnormal; Notable for the following:    Ketones, ur 80 (*)     Leukocytes, UA TRACE (*)  Biochemical Testing Only. Please order routine urinalysis from main lab if confirmatory testing is needed.   All other components within normal limits  CBC WITH DIFFERENTIAL - Abnormal; Notable for the following:    RBC 3.16 (*)     Hemoglobin 11.5 (*)     HCT 31.8 (*)     MCV 100.6 (*)     MCH 36.4 (*)     MCHC 36.2 (*)     Monocytes Relative 14 (*)     Monocytes Absolute 1.4 (*)     All other components within normal limits  POCT I-STAT, CHEM 8 - Abnormal; Notable for the following:    Sodium 129 (*)     Chloride 91 (*)     BUN 5 (*)     Glucose, Bld 126 (*)     Hemoglobin 11.9 (*)     HCT 35.0 (*)     All other components within normal limits  POCT PREGNANCY, URINE  URINE CULTURE  LAB REPORT - SCANNED   No results found.   1. Dehydration   2. Peripheral vertigo   3. UTI (lower urinary tract infection)       MDM  Impress periferal vertigo possible labyrinthitis as per the presence of tinnitus vs right otitis media (falopian tub dysfunction) although no significant findings on ear exam. Urinary frequency and trace LE concerning for UTI although no other urinary symptoms like dysuria, fever or chills. Decided to treat empirically with  keflex to cover for respiratory and urinary patogens.  Urine culture pending. Patient with mild to moderate dehydration with ketones in the urine and low sodium and chloride from vomiting, was given ondansetron SL 8 mg here and was tolerating oral fluids well. Afebrile. Normal white cell count. Reported felling much better prior discharge. Asked to follow up with PCP or return to the ED if recurrent vomiting despite following treatment.  Sharin Grave, MD 06/21/12 1727

## 2012-06-21 NOTE — ED Notes (Signed)
Pt. called back @ 1458.  Pt. verified x 2 and given results.  Pt. told to stop the Keflex and start Cipro twice/day for 7 days. Pt. wants Rx. called to [facility] on [address].  1642  Rx. called to pharmacist @ [phone].

## 2012-09-05 ENCOUNTER — Ambulatory Visit (INDEPENDENT_AMBULATORY_CARE_PROVIDER_SITE_OTHER): Payer: BC Managed Care – PPO | Admitting: Family Medicine

## 2012-09-05 ENCOUNTER — Encounter: Payer: Self-pay | Admitting: Family Medicine

## 2012-09-05 VITALS — BP 110/84 | HR 124 | Temp 98.6°F | Resp 12 | Ht 65.0 in | Wt 131.0 lb

## 2012-09-05 DIAGNOSIS — Z23 Encounter for immunization: Secondary | ICD-10-CM

## 2012-09-05 DIAGNOSIS — Z0000 Encounter for general adult medical examination without abnormal findings: Secondary | ICD-10-CM

## 2012-09-05 LAB — LIPID PANEL
Cholesterol: 283 mg/dL — ABNORMAL HIGH (ref 0–200)
HDL: 82.4 mg/dL (ref >39.00)
Total CHOL/HDL Ratio: 3
Triglycerides: 155 mg/dL — ABNORMAL HIGH (ref 0.0–149.0)
VLDL: 31 mg/dL (ref 0.0–40.0)

## 2012-09-05 LAB — POCT URINALYSIS DIPSTICK
Bilirubin, UA: NEGATIVE
Glucose, UA: NEGATIVE
Leukocytes, UA: NEGATIVE
Nitrite, UA: NEGATIVE
Spec Grav, UA: >=1.03
Urobilinogen, UA: 1
pH, UA: 5.5

## 2012-09-05 LAB — CBC WITH DIFFERENTIAL/PLATELET
Basophils Absolute: 0 10*3/uL (ref 0.0–0.1)
Basophils Relative: 0.3 % (ref 0.0–3.0)
Eosinophils Absolute: 0.1 10*3/uL (ref 0.0–0.7)
Eosinophils Relative: 0.9 % (ref 0.0–5.0)
HCT: 45.4 % (ref 36.0–46.0)
Hemoglobin: 15.6 g/dL — ABNORMAL HIGH (ref 12.0–15.0)
Lymphocytes Relative: 24.1 % (ref 12.0–46.0)
Lymphs Abs: 2.3 10*3/uL (ref 0.7–4.0)
MCHC: 34.2 g/dL (ref 30.0–36.0)
MCV: 107.3 fl — ABNORMAL HIGH (ref 78.0–100.0)
Monocytes Absolute: 1.7 10*3/uL — ABNORMAL HIGH (ref 0.1–1.0)
Monocytes Relative: 17.8 % — ABNORMAL HIGH (ref 3.0–12.0)
Neutro Abs: 5.4 10*3/uL (ref 1.4–7.7)
Neutrophils Relative %: 56.9 % (ref 43.0–77.0)
Platelets: 221 10*3/uL (ref 150.0–400.0)
RBC: 4.23 Mil/uL (ref 3.87–5.11)
RDW: 12.4 % (ref 11.5–14.6)
WBC: 9.5 10*3/uL (ref 4.5–10.5)

## 2012-09-05 LAB — HEPATIC FUNCTION PANEL
ALT: 111 U/L — ABNORMAL HIGH (ref 0–35)
AST: 119 U/L — ABNORMAL HIGH (ref 0–37)
Albumin: 5.2 g/dL (ref 3.5–5.2)
Alkaline Phosphatase: 41 U/L (ref 39–117)
Bilirubin, Direct: 0.2 mg/dL (ref 0.0–0.3)
Total Bilirubin: 1.7 mg/dL — ABNORMAL HIGH (ref 0.3–1.2)
Total Protein: 8.3 g/dL (ref 6.0–8.3)

## 2012-09-05 LAB — BASIC METABOLIC PANEL
BUN: 18 mg/dL (ref 6–23)
CO2: 22 mEq/L (ref 19–32)
Calcium: 10.5 mg/dL (ref 8.4–10.5)
Chloride: 92 mEq/L — ABNORMAL LOW (ref 96–112)
Creatinine, Ser: 0.8 mg/dL (ref 0.4–1.2)
GFR: 82.17 mL/min (ref >60.00)
Glucose, Bld: 105 mg/dL — ABNORMAL HIGH (ref 70–99)
Potassium: 3.8 mEq/L (ref 3.5–5.1)
Sodium: 133 mEq/L — ABNORMAL LOW (ref 135–145)

## 2012-09-05 LAB — TSH: TSH: 2.39 u[IU]/mL (ref 0.35–5.50)

## 2012-09-05 LAB — LDL CHOLESTEROL, DIRECT: Direct LDL: 183 mg/dL

## 2012-09-05 MED ORDER — TETANUS-DIPHTH-ACELL PERTUSSIS 5-2.5-18.5 LF-MCG/0.5 IM SUSP: 0.5000 mL | Freq: Once | INTRAMUSCULAR | Status: DC

## 2012-09-05 NOTE — Progress Notes (Signed)
  Subjective:    Patient ID: Natalie Morrison, female    DOB: Apr 06, 1971, 41 y.o.   MRN: [id number]  HPI  Patient seen for complete physical. She plans to schedule with gynecologist soon for breast exam and GYN checkup. Past medical history significant for hypertension, asthma, history of alcohol abuse, allergic rhinitis. Last tetanus unknown but probably over 10 years ago. No history of Pneumovax. She has scaled back her smoking greatly. Still drinks wine about 16 ounces about 4-5 times per week. No recent labs. She had mammogram July 2012. No consistent exercise.  Past Medical History  Diagnosis Date  . Asthma    No past surgical history on file.  reports that she has been smoking Cigarettes.  She has a 12.5 pack-year smoking history. She does not have any smokeless tobacco history on file. Her alcohol and drug histories not on file. family history is not on file. Allergies  Allergen Reactions  . Nitrofurantoin     REACTION: hives  . Sulfonamide Derivatives      Review of Systems  Constitutional: Negative for fever, activity change, appetite change, fatigue and unexpected weight change.  HENT: Negative for hearing loss, ear pain, sore throat and trouble swallowing.   Eyes: Negative for visual disturbance.  Respiratory: Positive for cough (Occasional). Negative for shortness of breath.   Cardiovascular: Negative for chest pain and palpitations.  Gastrointestinal: Negative for abdominal pain, diarrhea, constipation and blood in stool.  Genitourinary: Negative for dysuria and hematuria.  Musculoskeletal: Negative for myalgias, back pain and arthralgias.  Skin: Negative for rash.  Neurological: Negative for dizziness, syncope and headaches.  Hematological: Negative for adenopathy.  Psychiatric/Behavioral: Negative for confusion and dysphoric mood.       Objective:   Physical Exam  Constitutional: She is oriented to person, place, and time. She appears well-developed and  well-nourished.  HENT:  Head: Normocephalic and atraumatic.  Eyes: EOM are normal. Pupils are equal, round, and reactive to light.  Neck: Normal range of motion. Neck supple. No thyromegaly present.  Cardiovascular: Normal rate, regular rhythm and normal heart sounds.   No murmur heard. Pulmonary/Chest: Breath sounds normal. No respiratory distress. She has no wheezes. She has no rales.  Abdominal: Soft. Bowel sounds are normal. She exhibits no distension and no mass. There is no tenderness. There is no rebound and no guarding.  Musculoskeletal: Normal range of motion. She exhibits no edema.  Lymphadenopathy:    She has no cervical adenopathy.  Neurological: She is alert and oriented to person, place, and time. She displays normal reflexes. No cranial nerve deficit.  Skin: No rash noted.  Psychiatric: She has a normal mood and affect. Her behavior is normal. Judgment and thought content normal.          Assessment & Plan:  Complete physical. Patient declines Pap smear. She plans to set up with GYN soon. Already had flu vaccine. Pneumovax and tetanus boosters given. Obtain screening lab work. We again discussed importance of scaling back and discontinuing alcohol altogether if difficulties controlling amount. She is encouraged to stop smoking altogether-her motivation is low.

## 2012-09-05 NOTE — Patient Instructions (Addendum)
Smoking Cessation Quitting smoking is important to your health and has many advantages. However, it is not always easy to quit since nicotine is a very addictive drug. Often times, people try 3 times or more before being able to quit. This document explains the best ways for you to prepare to quit smoking. Quitting takes hard work and a lot of effort, but you can do it. ADVANTAGES OF QUITTING SMOKING  You will live longer, feel better, and live better.  Your body will feel the impact of quitting smoking almost immediately.  Within 20 minutes, blood pressure decreases. Your pulse returns to its normal level.  After 8 hours, carbon monoxide levels in the blood return to normal. Your oxygen level increases.  After 24 hours, the chance of having a heart attack starts to decrease. Your breath, hair, and body stop smelling like smoke.  After 48 hours, damaged nerve endings begin to recover. Your sense of taste and smell improve.  After 72 hours, the body is virtually free of nicotine. Your bronchial tubes relax and breathing becomes easier.  After 2 to 12 weeks, lungs can hold more air. Exercise becomes easier and circulation improves.  The risk of having a heart attack, stroke, cancer, or lung disease is greatly reduced.  After 1 year, the risk of coronary heart disease is cut in half.  After 5 years, the risk of stroke falls to the same as a nonsmoker.  After 10 years, the risk of lung cancer is cut in half and the risk of other cancers decreases significantly.  After 15 years, the risk of coronary heart disease drops, usually to the level of a nonsmoker.  If you are pregnant, quitting smoking will improve your chances of having a healthy baby.  The people you live with, especially any children, will be healthier.  You will have extra money to spend on things other than cigarettes. QUESTIONS TO THINK ABOUT BEFORE ATTEMPTING TO QUIT You may want to talk about your answers with your  caregiver.  Why do you want to quit?  If you tried to quit in the past, what helped and what did not?  What will be the most difficult situations for you after you quit? How will you plan to handle them?  Who can help you through the tough times? Your family? Friends? A caregiver?  What pleasures do you get from smoking? What ways can you still get pleasure if you quit? Here are some questions to ask your caregiver:  How can you help me to be successful at quitting?  What medicine do you think would be best for me and how should I take it?  What should I do if I need more help?  What is smoking withdrawal like? How can I get information on withdrawal? GET READY  Set a quit date.  Change your environment by getting rid of all cigarettes, ashtrays, matches, and lighters in your home, car, or work. Do not let people smoke in your home.  Review your past attempts to quit. Think about what worked and what did not. GET SUPPORT AND ENCOURAGEMENT You have a better chance of being successful if you have help. You can get support in many ways.  Tell your family, friends, and co-workers that you are going to quit and need their support. Ask them not to smoke around you.  Get individual, group, or telephone counseling and support. Programs are available at local hospitals and health centers. Call your local health department for   information about programs in your area.  Spiritual beliefs and practices may help some smokers quit.  Download a "quit meter" on your computer to keep track of quit statistics, such as how long you have gone without smoking, cigarettes not smoked, and money saved.  Get a self-help book about quitting smoking and staying off of tobacco. LEARN NEW SKILLS AND BEHAVIORS  Distract yourself from urges to smoke. Talk to someone, go for a walk, or occupy your time with a task.  Change your normal routine. Take a different route to work. Drink tea instead of coffee.  Eat breakfast in a different place.  Reduce your stress. Take a hot bath, exercise, or read a book.  Plan something enjoyable to do every day. Reward yourself for not smoking.  Explore interactive web-based programs that specialize in helping you quit. GET MEDICINE AND USE IT CORRECTLY Medicines can help you stop smoking and decrease the urge to smoke. Combining medicine with the above behavioral methods and support can greatly increase your chances of successfully quitting smoking.  Nicotine replacement therapy helps deliver nicotine to your body without the negative effects and risks of smoking. Nicotine replacement therapy includes nicotine gum, lozenges, inhalers, nasal sprays, and skin patches. Some may be available over-the-counter and others require a prescription.  Antidepressant medicine helps people abstain from smoking, but how this works is unknown. This medicine is available by prescription.  Nicotinic receptor partial agonist medicine simulates the effect of nicotine in your brain. This medicine is available by prescription. Ask your caregiver for advice about which medicines to use and how to use them based on your health history. Your caregiver will tell you what side effects to look out for if you choose to be on a medicine or therapy. Carefully read the information on the package. Do not use any other product containing nicotine while using a nicotine replacement product.  RELAPSE OR DIFFICULT SITUATIONS Most relapses occur within the first 3 months after quitting. Do not be discouraged if you start smoking again. Remember, most people try several times before finally quitting. You may have symptoms of withdrawal because your body is used to nicotine. You may crave cigarettes, be irritable, feel very hungry, cough often, get headaches, or have difficulty concentrating. The withdrawal symptoms are only temporary. They are strongest when you first quit, but they will go away within  10 14 days. To reduce the chances of relapse, try to:  Avoid drinking alcohol. Drinking lowers your chances of successfully quitting.  Reduce the amount of caffeine you consume. Once you quit smoking, the amount of caffeine in your body increases and can give you symptoms, such as a rapid heartbeat, sweating, and anxiety.  Avoid smokers because they can make you want to smoke.  Do not let weight gain distract you. Many smokers will gain weight when they quit, usually less than 10 pounds. Eat a healthy diet and stay active. You can always lose the weight gained after you quit.  Find ways to improve your mood other than smoking. FOR MORE INFORMATION  www.smokefree.gov  Document Released: 09/15/2001 Document Revised: 03/22/2012 Document Reviewed: 12/31/2011 [facility] Patient Information 2013 [place], [place].  Schedule to see gyn soon.

## 2012-09-09 ENCOUNTER — Other Ambulatory Visit: Payer: Self-pay | Admitting: Family Medicine

## 2012-09-13 ENCOUNTER — Other Ambulatory Visit (INDEPENDENT_AMBULATORY_CARE_PROVIDER_SITE_OTHER): Payer: BC Managed Care – PPO

## 2012-09-13 DIAGNOSIS — R319 Hematuria, unspecified: Secondary | ICD-10-CM

## 2012-09-13 LAB — POCT URINALYSIS DIPSTICK
Bilirubin, UA: NEGATIVE
Blood, UA: NEGATIVE
Glucose, UA: NEGATIVE
Leukocytes, UA: NEGATIVE
Nitrite, UA: NEGATIVE
Protein, UA: NEGATIVE
Spec Grav, UA: 1.02
Urobilinogen, UA: 0.2
pH, UA: 7

## 2012-09-14 NOTE — Progress Notes (Signed)
Quick Note:  Pt informed ______ 

## 2012-10-31 ENCOUNTER — Other Ambulatory Visit: Payer: Self-pay | Admitting: Family Medicine

## 2012-11-23 ENCOUNTER — Other Ambulatory Visit: Payer: Self-pay | Admitting: Family Medicine

## 2013-05-06 ENCOUNTER — Encounter (HOSPITAL_COMMUNITY): Payer: Self-pay | Admitting: Emergency Medicine

## 2013-05-06 ENCOUNTER — Emergency Department (HOSPITAL_COMMUNITY)
Admission: EM | Admit: 2013-05-06 | Discharge: 2013-05-06 | Disposition: A | Discharge status: Home / Self Care | Payer: BC Managed Care – PPO | Source: Home / Self Care | Attending: Emergency Medicine | Admitting: Emergency Medicine

## 2013-05-06 DIAGNOSIS — N390 Urinary tract infection, site not specified: Secondary | ICD-10-CM

## 2013-05-06 LAB — POCT URINALYSIS DIP (DEVICE)
Bilirubin Urine: NEGATIVE
Glucose, UA: NEGATIVE mg/dL
Ketones, ur: NEGATIVE mg/dL
Leukocytes, UA: NEGATIVE
Nitrite: NEGATIVE
Protein, ur: NEGATIVE mg/dL
Specific Gravity, Urine: 1.025 (ref 1.005–1.030)
Urobilinogen, UA: 0.2 mg/dL (ref 0.0–1.0)
pH: 6 (ref 5.0–8.0)

## 2013-05-06 LAB — POCT PREGNANCY, URINE: Preg Test, Ur: NEGATIVE

## 2013-05-06 MED ORDER — CIPROFLOXACIN HCL 500 MG PO TABS: 500.0000 mg | ORAL_TABLET | Freq: Two times a day (BID) | ORAL | Status: DC

## 2013-05-06 NOTE — ED Provider Notes (Signed)
Chief Complaint:   Chief Complaint  Patient presents with  . Urinary Tract Infection    History of Present Illness:   Natalie Morrison is a 42 year old female who 6 days ago had some fever and vomiting. This resolved, but for the past 5 days she's noted blood in her urine. She denies any dysuria, frequency, urgency, or malodorous urine. She has slight suprapubic pain. She denies any lower back pain or GYN symptoms. She's had no fever, chills, or further episodes of nausea or vomiting. She has had urinary tract infections before, but her last one was about a year ago. She cannot think of anything that might trigger this one.  Review of Systems:  Other than noted above, the patient denies any of the following symptoms: General:  No fevers, chills, sweats, aches, or fatigue. GI:  No abdominal pain, back pain, nausea, vomiting, diarrhea, or constipation. GU:  No dysuria, frequency, urgency, hematuria, or incontinence. GYN:  No discharge, itching, vulvar pain or lesions, pelvic pain, or abnormal vaginal bleeding.  PMFSH:  Past medical history, family history, social history, meds, and allergies were reviewed.    Physical Exam:   Vital signs:  BP 130/85  Pulse 85  Temp(Src) 98.4 F (36.9 C) (Oral)  Resp 16  SpO2 100%  LMP 04/18/2013 Gen:  Alert, oriented, in no distress. Lungs:  Clear to auscultation, no wheezes, rales or rhonchi. Heart:  Regular rhythm, no gallop or murmer. Abdomen:  Flat and soft. There was slight suprapubic pain to palpation.  No guarding, or rebound.  No hepato-splenomegaly or mass.  Bowel sounds were normally active.  No hernia. Back:  No CVA tenderness.  Skin:  Clear, warm and dry.  Labs:    Results for orders placed during the hospital encounter of 05/06/13  POCT URINALYSIS DIP (DEVICE)      Result Value Range   Glucose, UA NEGATIVE  NEGATIVE mg/dL   Bilirubin Urine NEGATIVE  NEGATIVE   Ketones, ur NEGATIVE  NEGATIVE mg/dL   Specific Gravity, Urine 1.025  1.005 -  1.030   Hgb urine dipstick LARGE (*) NEGATIVE   pH 6.0  5.0 - 8.0   Protein, ur NEGATIVE  NEGATIVE mg/dL   Urobilinogen, UA 0.2  0.0 - 1.0 mg/dL   Nitrite NEGATIVE  NEGATIVE   Leukocytes, UA NEGATIVE  NEGATIVE  POCT PREGNANCY, URINE      Result Value Range   Preg Test, Ur NEGATIVE  NEGATIVE     A urine culture was obtained.  Results are pending at this time and we will call about any positive results.  Assessment: The encounter diagnosis was UTI (lower urinary tract infection).   No evidence of pyelonephritis.  Plan:   1.  The following meds were prescribed:   Discharge Medication List as of 05/06/2013  3:12 PM    START taking these medications   Details  ciprofloxacin (CIPRO) 500 MG tablet Take 1 tablet (500 mg total) by mouth every 12 (twelve) hours., Starting 05/06/2013, Until Discontinued, Normal       2.  The patient was instructed in symptomatic care and handouts were given. 3.  The patient was told to return if becoming worse in any way, if no better in 3 or 4 days, and given some red flag symptoms such as fever, vomiting, or worsening pain that would indicate earlier return. 4.  The patient was told to avoid intercourse for 10 days, get extra fluids, and return for a follow up with her primary care  doctor at the completion of treatment for a repeat UA and culture.     Reuben Likes, MD 05/06/13 2122

## 2013-05-06 NOTE — ED Notes (Signed)
States she has blood in her urine and abd. Pain with tenderness since Thursday.  No medications taken.  Denies urine odor and frequency.

## 2013-05-07 LAB — URINE CULTURE
Colony Count: 3000
Special Requests: NORMAL

## 2013-07-17 ENCOUNTER — Other Ambulatory Visit: Payer: Self-pay | Admitting: Allergy and Immunology

## 2013-07-17 ENCOUNTER — Ambulatory Visit
Admission: RE | Admit: 2013-07-17 | Discharge: 2013-07-17 | Disposition: A | Discharge status: Home / Self Care | Payer: BC Managed Care – PPO | Source: Ambulatory Visit | Attending: Allergy and Immunology | Admitting: Allergy and Immunology

## 2013-07-17 DIAGNOSIS — J019 Acute sinusitis, unspecified: Secondary | ICD-10-CM

## 2013-07-17 DIAGNOSIS — J45909 Unspecified asthma, uncomplicated: Secondary | ICD-10-CM

## 2013-12-01 ENCOUNTER — Emergency Department (HOSPITAL_COMMUNITY)
Admission: EM | Admit: 2013-12-01 | Discharge: 2013-12-01 | Disposition: A | Discharge status: Home / Self Care | Payer: BC Managed Care – PPO | Source: Home / Self Care | Attending: Emergency Medicine | Admitting: Emergency Medicine

## 2013-12-01 ENCOUNTER — Emergency Department (INDEPENDENT_AMBULATORY_CARE_PROVIDER_SITE_OTHER): Payer: BC Managed Care – PPO

## 2013-12-01 ENCOUNTER — Encounter (HOSPITAL_COMMUNITY): Payer: Self-pay | Admitting: Emergency Medicine

## 2013-12-01 ENCOUNTER — Emergency Department (HOSPITAL_COMMUNITY): Payer: BC Managed Care – PPO

## 2013-12-01 DIAGNOSIS — J069 Acute upper respiratory infection, unspecified: Secondary | ICD-10-CM

## 2013-12-01 MED ORDER — BENZONATATE 100 MG PO CAPS: 100.0000 mg | ORAL_CAPSULE | Freq: Three times a day (TID) | ORAL | Status: DC | PRN

## 2013-12-01 NOTE — ED Notes (Signed)
Patient reports onset of symptoms one week ago (2/22).  Patient reports she vomited last Saturday.  Since then has had headache, sinus congestion, chest congestion, blood tinged secretions being blown from nose, otherwise clear phlegm.  Hot and cold episodes

## 2013-12-01 NOTE — Discharge Instructions (Signed)
Your blood pressure was elevated at today's visit. I would recommend having this re-checked by your primary care physician in 7-10 days. Your chest xray was normal. Exam does not indication need for antibiotics. Will provide Rx for cough (tessalon) and advise patient discontinue smoking. Oral hydration and tylenol or ibuprofen as directed on packaging for myalgias. If no improvement over the next 3-5 days, will advise follow up with your PCP. Antibiotic Nonuse  Your caregiver felt that the infection or problem was not one that would be helped with an antibiotic. Infections may be caused by viruses or bacteria. Only a caregiver can tell which one of these is the likely cause of an illness. A cold is the most common cause of infection in both adults and children. A cold is a virus. Antibiotic treatment will have no effect on a viral infection. Viruses can lead to many lost days of work caring for sick children and many missed days of school. Children may catch as many as 10 "colds" or "flus" per year during which they can be tearful, cranky, and uncomfortable. The goal of treating a virus is aimed at keeping the ill person comfortable. Antibiotics are medications used to help the body fight bacterial infections. There are relatively few types of bacteria that cause infections but there are hundreds of viruses. While both viruses and bacteria cause infection they are very different types of germs. A viral infection will typically go away by itself within 7 to 10 days. Bacterial infections may spread or get worse without antibiotic treatment. Examples of bacterial infections are:  Sore throats (like strep throat or tonsillitis).  Infection in the lung (pneumonia).  Ear and skin infections. Examples of viral infections are:  Colds or flus.  Most coughs and bronchitis.  Sore throats not caused by Strep.  Runny noses. It is often best not to take an antibiotic when a viral infection is the cause of the  problem. Antibiotics can kill off the helpful bacteria that we have inside our body and allow harmful bacteria to start growing. Antibiotics can cause side effects such as allergies, nausea, and diarrhea without helping to improve the symptoms of the viral infection. Additionally, repeated uses of antibiotics can cause bacteria inside of our body to become resistant. That resistance can be passed onto harmful bacterial. The next time you have an infection it may be harder to treat if antibiotics are used when they are not needed. Not treating with antibiotics allows our own immune system to develop and take care of infections more efficiently. Also, antibiotics will work better for [place] when they are prescribed for bacterial infections. Treatments for a child that is ill may include:  Give extra fluids throughout the day to stay hydrated.  Get plenty of rest.  Only give your child over-the-counter or prescription medicines for pain, discomfort, or fever as directed by your caregiver.  The use of a cool mist humidifier may help stuffy noses.  Cold medications if suggested by your caregiver. Your caregiver may decide to start you on an antibiotic if:  The problem you were seen for today continues for a longer length of time than expected.  You develop a secondary bacterial infection. SEEK MEDICAL CARE IF:  Fever lasts longer than 5 days.  Symptoms continue to get worse after 5 to 7 days or become severe.  Difficulty in breathing develops.  Signs of dehydration develop (poor drinking, rare urinating, dark colored urine).  Changes in behavior or worsening tiredness (listlessness or  lethargy). Document Released: 11/30/2001 Document Revised: 12/14/2011 Document Reviewed: 05/29/2009 [facility] Patient Information 2014 [place], [place].

## 2013-12-01 NOTE — ED Provider Notes (Signed)
Medical screening examination/treatment/procedure(s) were performed by non-physician practitioner and as supervising physician I was immediately available for consultation/collaboration.  Philipp Deputy, M.D.   Harden Mo, MD 12/01/13 [phone]

## 2013-12-01 NOTE — ED Provider Notes (Signed)
CSN: [id number]     Arrival date & time 12/01/13  1301 History   First MD Initiated Contact with Patient 12/01/13 1354     Chief Complaint  Patient presents with  . URI   (Consider location/radiation/quality/duration/timing/severity/associated sxs/prior Treatment) HPI Comments: Did receive 2014-2015 flu shot  Patient is a 43 y.o. female presenting with URI. The history is provided by the patient.  URI Presenting symptoms: congestion, cough, fatigue, fever and rhinorrhea   Presenting symptoms: no sore throat   Severity:  Moderate Onset quality:  Gradual Duration:  6 days Timing:  Constant Progression:  Unchanged Chronicity:  New Associated symptoms: myalgias   Risk factors comment:  +chronic smoker   Past Medical History  Diagnosis Date  . Asthma    Past Surgical History  Procedure Laterality Date  . Breast lumpectomy     No family history on file. History  Substance Use Topics  . Smoking status: Current Every Day Smoker -- 0.50 packs/day for 25 years    Types: Cigarettes  . Smokeless tobacco: Not on file  . Alcohol Use: 1.8 oz/week    3 Shots of liquor per week   OB History   Grav Para Term Preterm Abortions TAB SAB Ect Mult Living                 Review of Systems  Constitutional: Positive for fever and fatigue.  HENT: Positive for congestion, rhinorrhea and sinus pressure. Negative for sore throat.   Eyes: Negative.   Respiratory: Positive for cough.   Cardiovascular: Negative.   Gastrointestinal: Negative.   Genitourinary: Negative.   Musculoskeletal: Positive for myalgias.  Skin: Negative.     Allergies  Nitrofurantoin and Sulfonamide derivatives  Home Medications   Current Outpatient Rx  Name  Route  Sig  Dispense  Refill  . aspirin EC 81 MG tablet   Oral   Take 81 mg by mouth daily.         . Chlorphen-Pseudoephed-APAP (THERAFLU FLU/COLD PO)   Oral   Take by mouth.         Marland Kitchen albuterol (PROVENTIL,VENTOLIN) 90 MCG/ACT inhaler  Inhalation   Inhale 2 puffs into the lungs every 6 (six) hours as needed. Per Dr Velora Heckler, rescue inhaler         . benazepril-hydrochlorthiazide (LOTENSIN HCT) 20-25 MG per tablet      TAKE 1 TABLET EVERY DAY   90 tablet   3   . benzonatate (TESSALON) 100 MG capsule   Oral   Take 1 capsule (100 mg total) by mouth 3 (three) times daily as needed for cough.   21 capsule   0   . cetirizine (ZYRTEC) 10 MG chewable tablet   Oral   Chew 10 mg by mouth daily.           . ciclesonide (ALVESCO) 80 MCG/ACT inhaler   Inhalation   Inhale 1 puff into the lungs 2 (two) times daily. Per Dr Velora Heckler, Allergy doctor         . ciprofloxacin (CIPRO) 500 MG tablet   Oral   Take 1 tablet (500 mg total) by mouth every 12 (twelve) hours.   20 tablet   0   . citalopram (CELEXA) 20 MG tablet      TAKE 1/2 TABLET BY MOUTH EVERY DAY   30 tablet   11   . citalopram (CELEXA) 20 MG tablet      TAKE 1/2 TABLET BY MOUTH EVERY DAY   45 tablet  3    BP 161/97  Pulse 81  Temp(Src) 99.1 F (37.3 C) (Oral)  Resp 16  SpO2 97%  LMP 11/17/2013 Physical Exam  Nursing note and vitals reviewed. Constitutional: She is oriented to person, place, and time. She appears well-developed and well-nourished. No distress.  HENT:  Head: Normocephalic and atraumatic.  Right Ear: Hearing, tympanic membrane, external ear and ear canal normal.  Left Ear: Hearing, tympanic membrane, external ear and ear canal normal.  Nose: Nose normal.  Mouth/Throat: Uvula is midline, oropharynx is clear and moist and mucous membranes are normal.  Eyes: Conjunctivae are normal.  Neck: Normal range of motion. Neck supple.  Cardiovascular: Normal rate, regular rhythm and normal heart sounds.   Pulmonary/Chest: Effort normal and breath sounds normal. No respiratory distress. She has no wheezes.  Abdominal: Soft. Bowel sounds are normal. She exhibits no distension. There is no tenderness.  Musculoskeletal: Normal range of  motion. She exhibits no edema and no tenderness.  Lymphadenopathy:    She has no cervical adenopathy.  Neurological: She is alert and oriented to person, place, and time.  Skin: Skin is warm and dry. No rash noted.  Psychiatric: She has a normal mood and affect. Her behavior is normal.    ED Course  Procedures (including critical care time) Labs Review Labs Reviewed - No data to display Imaging Review Dg Chest 2 View  12/01/2013   CLINICAL DATA:  Low grade fever.  EXAM: CHEST  2 VIEW  COMPARISON:  PA and lateral chest 07/17/2013.  FINDINGS: The lungs are clear. Heart size is normal. No pneumothorax or pleural effusion. No focal bony abnormality is identified.  IMPRESSION: No acute disease.   Electronically Signed   By: Inge Rise M.D.   On: 12/01/2013 14:33     MDM   1. URI (upper respiratory infection)    URI: CXR without acute finding. Resolving URI. Exam does not indicate need for antibiotics. Will provide Rx for cough (tessalon) and advise patient discontinue smoking. No increased work of breathing or wheezing on exam to indicate bronchitis or need for oral steroids for pulmonary inflammation. Oral hydration and tylenol or ibuprofen as directed on packaging for myalgias. If no improvement over the next 3-5 days, will advise follow up with her PCP. Will not provide narcotic cough suppressant as patient has a history of alcohol abuse.    [place], [place] 12/01/13 [phone]

## 2014-01-25 ENCOUNTER — Ambulatory Visit (INDEPENDENT_AMBULATORY_CARE_PROVIDER_SITE_OTHER): Payer: BC Managed Care – PPO | Admitting: Family Medicine

## 2014-01-25 ENCOUNTER — Encounter: Payer: Self-pay | Admitting: Family Medicine

## 2014-01-25 VITALS — BP 134/84 | HR 110 | Temp 98.2°F | Wt 134.0 lb

## 2014-01-25 DIAGNOSIS — J019 Acute sinusitis, unspecified: Secondary | ICD-10-CM

## 2014-01-25 NOTE — Progress Notes (Signed)
   Subjective:    Patient ID: Natalie Morrison, female    DOB: 03/03/1971, 43 y.o.   MRN: [id number]  Sinusitis Associated symptoms include congestion, coughing, headaches and sinus pressure. Pertinent negatives include no chills.   Patient is seen for acute visit. Sinus congestion for the past week. She's had some left facial pain and bloody nasal discharge off and on. Went to work clinic. This past Monday she was placed on Augmentin. She feels slightly better today. She'll some cough which is mostly nonproductive. Possible fever at home but she has not taken her temperature. No nausea or vomiting. Patient does smoke but has scaled back recently.  Past Medical History  Diagnosis Date  . Asthma    Past Surgical History  Procedure Laterality Date  . Breast lumpectomy      reports that she has been smoking Cigarettes.  She has a 12.5 pack-year smoking history. She does not have any smokeless tobacco history on file. She reports that she drinks about 1.8 ounces of alcohol per week. She reports that she does not use illicit drugs. family history is not on file. Allergies  Allergen Reactions  . Nitrofurantoin     REACTION: hives  . Sulfonamide Derivatives       Review of Systems  Constitutional: Negative for fever, chills, appetite change and unexpected weight change.  HENT: Positive for congestion and sinus pressure.   Respiratory: Positive for cough. Negative for wheezing.   Neurological: Positive for headaches.       Objective:   Physical Exam  Constitutional: She appears well-developed and well-nourished. No distress.  HENT:  Right Ear: External ear normal.  Left Ear: External ear normal.  Mouth/Throat: Oropharynx is clear and moist.  Neck: Neck supple.  Cardiovascular: Normal rate.   Pulmonary/Chest: Effort normal and breath sounds normal. No respiratory distress. She has no wheezes. She has no rales.  Lymphadenopathy:    She has no cervical adenopathy.            Assessment & Plan:  Acute sinusitis/bronchitis. Finish out Augmentin. We do not see need to change antibiotic at this time. Nonfocal exam. She is encouraged to stop smoking. Followup when necessary.

## 2014-01-25 NOTE — Progress Notes (Signed)
Pre visit review using our clinic review tool, if applicable. No additional management support is needed unless otherwise documented below in the visit note. 

## 2014-01-25 NOTE — Patient Instructions (Signed)

## 2014-01-26 ENCOUNTER — Telehealth: Payer: Self-pay | Admitting: Family Medicine

## 2014-01-26 NOTE — Telephone Encounter (Signed)
Relevant patient education mailed to patient.  

## 2014-05-16 ENCOUNTER — Telehealth: Payer: Self-pay | Admitting: Family Medicine

## 2014-05-16 MED ORDER — BENAZEPRIL-HYDROCHLOROTHIAZIDE 20-25 MG PO TABS: ORAL_TABLET | ORAL | Status: DC

## 2014-05-16 NOTE — Telephone Encounter (Signed)
[id number] - [place], [place] [address] is requesting re-fill on benazepril-hydrochlorthiazide (LOTENSIN HCT) 20-25 MG per tablet

## 2014-05-16 NOTE — Telephone Encounter (Signed)
Rx sent to pharmacy   

## 2014-06-06 ENCOUNTER — Other Ambulatory Visit (INDEPENDENT_AMBULATORY_CARE_PROVIDER_SITE_OTHER): Payer: Self-pay | Admitting: Otolaryngology

## 2014-06-06 DIAGNOSIS — J328 Other chronic sinusitis: Secondary | ICD-10-CM

## 2015-03-14 ENCOUNTER — Encounter (HOSPITAL_COMMUNITY): Payer: Self-pay | Admitting: Emergency Medicine

## 2015-03-14 ENCOUNTER — Emergency Department (HOSPITAL_COMMUNITY)
Admission: EM | Admit: 2015-03-14 | Discharge: 2015-03-14 | Disposition: A | Discharge status: Home / Self Care | Payer: BLUE CROSS/BLUE SHIELD | Source: Home / Self Care | Attending: Family Medicine | Admitting: Family Medicine

## 2015-03-14 DIAGNOSIS — A084 Viral intestinal infection, unspecified: Secondary | ICD-10-CM

## 2015-03-14 DIAGNOSIS — E860 Dehydration: Secondary | ICD-10-CM

## 2015-03-14 DIAGNOSIS — R011 Cardiac murmur, unspecified: Secondary | ICD-10-CM

## 2015-03-14 MED ORDER — ONDANSETRON 4 MG PO TBDP
8.0000 mg | ORAL_TABLET | Freq: Once | ORAL | Status: AC
  Administered 2015-03-14: 8 mg via ORAL

## 2015-03-14 MED ORDER — SODIUM CHLORIDE 0.9 % IV BOLUS (SEPSIS)
1000.0000 mL | Freq: Once | INTRAVENOUS | Status: AC
  Administered 2015-03-14: 1000 mL via INTRAVENOUS

## 2015-03-14 MED ORDER — ONDANSETRON HCL 8 MG PO TABS: 8.0000 mg | ORAL_TABLET | Freq: Three times a day (TID) | ORAL | Status: DC | PRN

## 2015-03-14 MED ORDER — ONDANSETRON 4 MG PO TBDP
ORAL_TABLET | ORAL | Status: AC
  Filled 2015-03-14: qty 2

## 2015-03-14 MED ORDER — ONDANSETRON HCL 4 MG/2ML IJ SOLN
4.0000 mg | Freq: Once | INTRAMUSCULAR | Status: DC
Start: 2015-03-14 — End: 2015-03-14

## 2015-03-14 NOTE — Discharge Instructions (Signed)
Thank you for coming in today. If your belly pain worsens, or you have high fever, bad vomiting, blood in your stool or black tarry stool go to the Emergency Room.  Follow up with your doctor.  Viral Gastroenteritis Viral gastroenteritis is also known as stomach flu. This condition affects the stomach and intestinal tract. It can cause sudden diarrhea and vomiting. The illness typically lasts 3 to 8 days. Most people develop an immune response that eventually gets rid of the virus. While this natural response develops, the virus can make you quite ill. CAUSES  Many different viruses can cause gastroenteritis, such as rotavirus or noroviruses. You can catch one of these viruses by consuming contaminated food or water. You may also catch a virus by sharing utensils or other personal items with an infected person or by touching a contaminated surface. SYMPTOMS  The most common symptoms are diarrhea and vomiting. These problems can cause a severe loss of body fluids (dehydration) and a body salt (electrolyte) imbalance. Other symptoms may include:  Fever.  Headache.  Fatigue.  Abdominal pain. DIAGNOSIS  Your caregiver can usually diagnose viral gastroenteritis based on your symptoms and a physical exam. A stool sample may also be taken to test for the presence of viruses or other infections. TREATMENT  This illness typically goes away on its own. Treatments are aimed at rehydration. The most serious cases of viral gastroenteritis involve vomiting so severely that you are not able to keep fluids down. In these cases, fluids must be given through an intravenous line (IV). HOME CARE INSTRUCTIONS   Drink enough fluids to keep your urine clear or pale yellow. Drink small amounts of fluids frequently and increase the amounts as tolerated.  Ask your caregiver for specific rehydration instructions.  Avoid:  Foods high in sugar.  Alcohol.  Carbonated drinks.  Tobacco.  Juice.  Caffeine  drinks.  Extremely hot or cold fluids.  Fatty, greasy foods.  Too much intake of anything at one time.  Dairy products until 24 to 48 hours after diarrhea stops.  You may consume probiotics. Probiotics are active cultures of beneficial bacteria. They may lessen the amount and number of diarrheal stools in adults. Probiotics can be found in yogurt with active cultures and in supplements.  Wash your hands well to avoid spreading the virus.  Only take over-the-counter or prescription medicines for pain, discomfort, or fever as directed by your caregiver. Do not give aspirin to children. Antidiarrheal medicines are not recommended.  Ask your caregiver if you should continue to take your regular prescribed and over-the-counter medicines.  Keep all follow-up appointments as directed by your caregiver. SEEK IMMEDIATE MEDICAL CARE IF:   You are unable to keep fluids down.  You do not urinate at least once every 6 to 8 hours.  You develop shortness of breath.  You notice blood in your stool or vomit. This may look like coffee grounds.  You have abdominal pain that increases or is concentrated in one small area (localized).  You have persistent vomiting or diarrhea.  You have a fever.  The patient is a child younger than 3 months, and he or she has a fever.  The patient is a child older than 3 months, and he or she has a fever and persistent symptoms.  The patient is a child older than 3 months, and he or she has a fever and symptoms suddenly get worse.  The patient is a baby, and he or she has no tears when  crying. MAKE SURE YOU:   Understand these instructions.  Will watch your condition.  Will get help right away if you are not doing well or get worse. Document Released: 09/21/2005 Document Revised: 12/14/2011 Document Reviewed: 07/08/2011 [facility] Patient Information 2015 [place], [place]. This information is not intended to replace advice given to you by your health care  provider. Make sure you discuss any questions you have with your health care provider.   Heart Murmur A heart murmur is an extra sound heard by your health care provider when listening to your heart with a device called a stethoscope. The sound comes from turbulence when blood flows through the heart and may be a "hum" or "whoosh" sound heard when the heart beats. There are two types of heart murmurs:  Innocent murmurs. Most people with this type of heart murmur do not have a heart problem. Many children have innocent heart murmurs. Your health care provider may suggest some basic testing to know whether your murmur is an innocent murmur. If an innocent heart murmur is found, there is no need for further tests or treatment and no need to restrict activities or stop playing sports.  Abnormal murmurs. These types of murmurs can occur in children and adults. In children, abnormal heart murmurs are typically caused from heart defects that are present at birth (congenital). In adults, abnormal murmurs are usually from heart valve problems caused by disease, infection, or aging. CAUSES  All heart murmurs are a result of an issue with your heart valves. Normally, these valves open to let blood flow through or out of your heart and then shut to keep it from flowing backward. If they do not work properly, you could have:  Regurgitation--When blood leaks back through the valve in the wrong direction.  Mitral valve prolapse--When the mitral valve of the heart has a loose flap and does not close tightly.  Stenosis--When the valve does not open enough and blocks blood flow. SIGNS AND SYMPTOMS  Innocent murmurs do not cause symptoms, and many people with abnormal murmurs may or may not have symptoms. If symptoms do develop, they may include:  Shortness of breath.  Blue coloring of the skin, especially on the fingertips.  Chest pain.  Palpitations, or feeling a fluttering or skipped  heartbeat.  Fainting.  Persistent cough.  Getting tired much faster than expected. DIAGNOSIS  A heart murmur might be heard during a sports physical or during any type of examination. When a murmur is heard, it may suggest a possible problem. When this happens, your health care provider may ask you to see a heart specialist (cardiologist). You may also be asked to have one or more heart tests. In these cases, testing may vary depending on what your health care provider heard. Tests for a heart murmur may include:  Electrocardiogram.  Echocardiogram.  MRI. For children and adults who have an abnormal heart murmur and want to play sports, it is important to complete testing, review test results, and receive recommendations from your health care provider. If heart disease is present, it may not be safe to play. TREATMENT  Innocent murmurs require no treatment or activity restriction. If an abnormal murmur represents a problem with the heart, treatment will depend on the exact nature of the problem. In these cases, medicine or surgery may be needed to treat the problem. HOME CARE INSTRUCTIONS If you want to participate in sports or other types of strenuous physical activity, it is important to discuss this first with  your health care provider. If the murmur represents a problem with the heart and you choose to participate in sports, there is a small chance that a serious problem (including sudden death) could result.  SEEK MEDICAL CARE IF:   You feel that your symptoms are slowly worsening.  You develop any new symptoms that cause concern.  You feel that you are having side effects from any medicines prescribed. SEEK IMMEDIATE MEDICAL CARE IF:   You develop chest pain.  You have shortness of breath.  You notice that your heart beats irregularly often enough to cause you to worry.  You have fainting spells.  Your symptoms suddenly get worse. Document Released: 10/29/2004 Document  Revised: 09/26/2013 Document Reviewed: 05/29/2013 [facility] Patient Information 2015 [place], [place]. This information is not intended to replace advice given to you by your health care provider. Make sure you discuss any questions you have with your health care provider.

## 2015-03-14 NOTE — ED Provider Notes (Signed)
Natalie Morrison is a 44 y.o. female who presents to Urgent Care today for nausea and vomiting present for the last 2 days. Patient feels slightly lightheaded. No abdominal pain or blood in the vomiting or stool. No fevers or chills. She's tried some leftover antinausea medicines in the past which helped a little. She feels well otherwise.   Past Medical History  Diagnosis Date  . Asthma    Past Surgical History  Procedure Laterality Date  . Breast lumpectomy     History  Substance Use Topics  . Smoking status: Current Every Day Smoker -- 0.50 packs/day for 25 years    Types: Cigarettes  . Smokeless tobacco: Not on file  . Alcohol Use: 1.8 oz/week    3 Shots of liquor per week   ROS as above Medications: Current Facility-Administered Medications  Medication Dose Route Frequency Provider Last Rate Last Dose  . albuterol (PROVENTIL) (2.5 MG/3ML) 0.083% nebulizer solution 2.5 mg  2.5 mg Nebulization 1 day or 1 dose Burnice Logan, MD      . ondansetron [facility]) injection 4 mg  4 mg Intravenous Once Gregor Hams, MD      . sodium chloride 0.9 % bolus 1,000 mL  1,000 mL Intravenous Once Gregor Hams, MD       Current Outpatient Prescriptions  Medication Sig Dispense Refill  . albuterol (PROVENTIL,VENTOLIN) 90 MCG/ACT inhaler Inhale 2 puffs into the lungs every 6 (six) hours as needed. Per Dr Velora Heckler, rescue inhaler    . aspirin EC 81 MG tablet Take 81 mg by mouth daily.    . benazepril-hydrochlorthiazide (LOTENSIN HCT) 20-25 MG per tablet TAKE 1 TABLET EVERY DAY 90 tablet 1  . cetirizine (ZYRTEC) 10 MG chewable tablet Chew 10 mg by mouth daily.      . ciclesonide (ALVESCO) 80 MCG/ACT inhaler Inhale 1 puff into the lungs 2 (two) times daily. Per Dr Velora Heckler, Allergy doctor    . citalopram (CELEXA) 20 MG tablet TAKE 1/2 TABLET BY MOUTH EVERY DAY 30 tablet 11   Allergies  Allergen Reactions  . Nitrofurantoin     REACTION: hives  . Sulfonamide Derivatives      Exam:  BP 117/86 mmHg   Pulse 118  Temp(Src) 98.7 F (37.1 C) (Oral)  Resp 16  SpO2 99% Gen: Well NAD, nontoxic appearing HEENT: EOMI,  MMM Lungs: Normal work of breathing. CTABL Heart: Tachycardia present. Loud systolic murmur at apex. Does not radiate.  Abd: NABS, Soft. Nondistended, Nontender Exts: Brisk capillary refill, warm and well perfused.   Patient was given 1 L normal saline bolus, and 8 mg of ODT Zofran. She felt much better and her heart rate decreased to 80 bpm  No results found for this or any previous visit (from the past 24 hour(s)). No results found.  Assessment and Plan: 44 y.o. female with  1) viral gastroenteritis. Treat with Zofran 2) dehydration improved with IV fluids. Recommend oral hydration at home.  3) murmur. Follow-up with PCP  Discussed warning signs or symptoms. Please see discharge instructions. Patient expresses understanding.     Gregor Hams, MD 03/14/15 [phone]

## 2015-03-14 NOTE — ED Notes (Signed)
C/o feeling nauseas associated w/vomiting; also reports left ear pain Alert, no signs of acute distress.

## 2015-06-14 ENCOUNTER — Encounter: Payer: Self-pay | Admitting: Family Medicine

## 2015-06-14 ENCOUNTER — Ambulatory Visit (INDEPENDENT_AMBULATORY_CARE_PROVIDER_SITE_OTHER): Payer: BLUE CROSS/BLUE SHIELD | Admitting: Family Medicine

## 2015-06-14 VITALS — BP 110/78 | HR 103 | Temp 98.6°F | Wt 125.0 lb

## 2015-06-14 DIAGNOSIS — Z8659 Personal history of other mental and behavioral disorders: Secondary | ICD-10-CM | POA: Diagnosis not present

## 2015-06-14 DIAGNOSIS — F1010 Alcohol abuse, uncomplicated: Secondary | ICD-10-CM | POA: Diagnosis not present

## 2015-06-14 DIAGNOSIS — F418 Other specified anxiety disorders: Secondary | ICD-10-CM | POA: Diagnosis not present

## 2015-06-14 DIAGNOSIS — F32A Depression, unspecified: Secondary | ICD-10-CM

## 2015-06-14 DIAGNOSIS — F329 Major depressive disorder, single episode, unspecified: Secondary | ICD-10-CM

## 2015-06-14 DIAGNOSIS — I10 Essential (primary) hypertension: Secondary | ICD-10-CM

## 2015-06-14 DIAGNOSIS — F419 Anxiety disorder, unspecified: Secondary | ICD-10-CM

## 2015-06-14 LAB — CBC WITH DIFFERENTIAL/PLATELET
Basophils Absolute: 0 10*3/uL (ref 0.0–0.1)
Basophils Relative: 0.5 % (ref 0.0–3.0)
Eosinophils Absolute: 0.1 10*3/uL (ref 0.0–0.7)
Eosinophils Relative: 1 % (ref 0.0–5.0)
HCT: 40.8 % (ref 36.0–46.0)
Hemoglobin: 13.9 g/dL (ref 12.0–15.0)
Lymphocytes Relative: 26.8 % (ref 12.0–46.0)
Lymphs Abs: 2.5 10*3/uL (ref 0.7–4.0)
MCHC: 34 g/dL (ref 30.0–36.0)
MCV: 112.2 fl — ABNORMAL HIGH (ref 78.0–100.0)
Monocytes Absolute: 1.2 10*3/uL — ABNORMAL HIGH (ref 0.1–1.0)
Monocytes Relative: 13.2 % — ABNORMAL HIGH (ref 3.0–12.0)
Neutro Abs: 5.5 10*3/uL (ref 1.4–7.7)
Neutrophils Relative %: 58.5 % (ref 43.0–77.0)
Platelets: 215 10*3/uL (ref 150.0–400.0)
RBC: 3.64 Mil/uL — ABNORMAL LOW (ref 3.87–5.11)
RDW: 13.8 % (ref 11.5–15.5)
WBC: 9.3 10*3/uL (ref 4.0–10.5)

## 2015-06-14 LAB — HEPATIC FUNCTION PANEL
ALT: 45 U/L — ABNORMAL HIGH (ref 0–35)
AST: 47 U/L — ABNORMAL HIGH (ref 0–37)
Albumin: 4.7 g/dL (ref 3.5–5.2)
Alkaline Phosphatase: 38 U/L — ABNORMAL LOW (ref 39–117)
Bilirubin, Direct: 0.2 mg/dL (ref 0.0–0.3)
Total Bilirubin: 0.5 mg/dL (ref 0.2–1.2)
Total Protein: 7.3 g/dL (ref 6.0–8.3)

## 2015-06-14 LAB — BASIC METABOLIC PANEL
BUN: 19 mg/dL (ref 6–23)
CO2: 30 mEq/L (ref 19–32)
Calcium: 10.4 mg/dL (ref 8.4–10.5)
Chloride: 96 mEq/L (ref 96–112)
Creatinine, Ser: 1.08 mg/dL (ref 0.40–1.20)
GFR: 58.21 mL/min — ABNORMAL LOW (ref >60.00)
Glucose, Bld: 104 mg/dL — ABNORMAL HIGH (ref 70–99)
Potassium: 4.8 mEq/L (ref 3.5–5.1)
Sodium: 137 mEq/L (ref 135–145)

## 2015-06-14 LAB — LIPID PANEL
Cholesterol: 228 mg/dL — ABNORMAL HIGH (ref 0–200)
HDL: 78 mg/dL (ref >39.00)
LDL Cholesterol: 129 mg/dL — ABNORMAL HIGH (ref 0–99)
NonHDL: 150.34
Total CHOL/HDL Ratio: 3
Triglycerides: 109 mg/dL (ref 0.0–149.0)
VLDL: 21.8 mg/dL (ref 0.0–40.0)

## 2015-06-14 MED ORDER — CITALOPRAM HYDROBROMIDE 20 MG PO TABS: 10.0000 mg | ORAL_TABLET | Freq: Every day | ORAL | Status: DC

## 2015-06-14 MED ORDER — BENAZEPRIL-HYDROCHLOROTHIAZIDE 20-25 MG PO TABS: ORAL_TABLET | ORAL | Status: DC

## 2015-06-14 NOTE — Progress Notes (Signed)
Pre visit review using our clinic review tool, if applicable. No additional management support is needed unless otherwise documented below in the visit note. 

## 2015-06-14 NOTE — Progress Notes (Signed)
   Subjective:    Patient ID: Natalie Morrison, female    DOB: [birth year], 44 y.o.   MRN: [id number]  HPI Patient here for follow-up regarding several items  She went to emergency department - or actually, urgent care back in June with gastroneuritis. She was dehydrated and improved following IV fluids. It was mentioned to her at that time that she had a heart murmur though she was not aware of any previous. She's not had any dyspnea or chest pains or dizziness. She does have history of alcohol abuse and she has recently reduced this greatly.  She has history of depression and anxiety and previously was on citalopram low dosage 10 mg daily and requesting going back on this. She is on benazepril HCTZ one half tablet daily for hypertension and blood pressures have been stable. She continues to see allergist and is on steroid inhaler. She plans to get flu vaccine through work.  Past Medical History  Diagnosis Date  . Asthma    Past Surgical History  Procedure Laterality Date  . Breast lumpectomy      reports that she has been smoking Cigarettes.  She has a 12.5 pack-year smoking history. She does not have any smokeless tobacco history on file. She reports that she drinks about 1.8 oz of alcohol per week. She reports that she does not use illicit drugs. family history is not on file. Allergies  Allergen Reactions  . Nitrofurantoin     REACTION: hives  . Sulfonamide Derivatives       Review of Systems  Constitutional: Negative for fatigue.  Eyes: Negative for visual disturbance.  Respiratory: Negative for cough, chest tightness, shortness of breath and wheezing.   Cardiovascular: Negative for chest pain, palpitations and leg swelling.  Neurological: Negative for dizziness, seizures, syncope, weakness, light-headedness and headaches.  Psychiatric/Behavioral: Positive for dysphoric mood. Negative for suicidal ideas and agitation. The patient is nervous/anxious.        Objective:   Physical Exam  Constitutional: She is oriented to person, place, and time. She appears well-developed and well-nourished.  Neck: Neck supple. No thyromegaly present.  Cardiovascular: Normal rate and regular rhythm.  Exam reveals no gallop.   No murmur heard. Pulmonary/Chest: Effort normal and breath sounds normal. No respiratory distress. She has no wheezes. She has no rales.  Musculoskeletal: She exhibits no edema.  Neurological: She is alert and oriented to person, place, and time. No cranial nerve deficit.  Psychiatric: Her behavior is normal. Thought content normal.  Slightly anxious but appropriate affect otherwise          Assessment & Plan:  #1 recent reported heart murmur. None noted today on exam whatsoever. Reassurance 2 hypertension stable and at goal. Continue current medication. Check basic metabolic panel  #3 alcohol abuse history. She is strongly advised to abstain from alcohol altogether. Current motivation appears to be low. Check further labs with CBC, hepatic panel, chemistries Continue daily multivitamin #4 history of depression and anxiety. Refill citalopram 20 mg one half tablet daily and after 2-3 weeks consider further titration to 20 mg daily but she previously noted benefit with 10 mg

## 2015-06-18 ENCOUNTER — Other Ambulatory Visit: Payer: Self-pay | Admitting: Family Medicine

## 2015-06-18 DIAGNOSIS — I10 Essential (primary) hypertension: Secondary | ICD-10-CM

## 2015-08-11 ENCOUNTER — Emergency Department (HOSPITAL_COMMUNITY)
Admission: EM | Admit: 2015-08-11 | Discharge: 2015-08-11 | Disposition: A | Discharge status: Home / Self Care | Payer: BLUE CROSS/BLUE SHIELD | Source: Home / Self Care

## 2015-08-11 ENCOUNTER — Emergency Department (INDEPENDENT_AMBULATORY_CARE_PROVIDER_SITE_OTHER): Payer: BLUE CROSS/BLUE SHIELD

## 2015-08-11 ENCOUNTER — Encounter (HOSPITAL_COMMUNITY): Payer: Self-pay | Admitting: Emergency Medicine

## 2015-08-11 DIAGNOSIS — J45909 Unspecified asthma, uncomplicated: Secondary | ICD-10-CM

## 2015-08-11 MED ORDER — IPRATROPIUM-ALBUTEROL 0.5-2.5 (3) MG/3ML IN SOLN
3.0000 mL | Freq: Once | RESPIRATORY_TRACT | Status: AC
  Administered 2015-08-11: 3 mL via RESPIRATORY_TRACT

## 2015-08-11 MED ORDER — ALBUTEROL SULFATE (2.5 MG/3ML) 0.083% IN NEBU
INHALATION_SOLUTION | RESPIRATORY_TRACT | Status: AC
  Filled 2015-08-11: qty 3

## 2015-08-11 MED ORDER — IPRATROPIUM-ALBUTEROL 0.5-2.5 (3) MG/3ML IN SOLN
RESPIRATORY_TRACT | Status: AC
  Filled 2015-08-11: qty 3

## 2015-08-11 MED ORDER — ALBUTEROL SULFATE (2.5 MG/3ML) 0.083% IN NEBU
2.5000 mg | INHALATION_SOLUTION | Freq: Once | RESPIRATORY_TRACT | Status: AC
Start: 2015-08-11 — End: 2015-08-11
  Administered 2015-08-11: 2.5 mg via RESPIRATORY_TRACT

## 2015-08-11 MED ORDER — PREDNISONE 10 MG PO TABS: ORAL_TABLET | ORAL | Status: DC

## 2015-08-11 NOTE — ED Provider Notes (Signed)
CSN: [id number]     Arrival date & time 08/11/15  1553 History   None    Chief Complaint  Patient presents with  . Bronchitis  . Asthma   (Consider location/radiation/quality/duration/timing/severity/associated sxs/prior Treatment) HPI History obtained from patient:   LOCATION:chest SEVERITY: DURATION:over 1 week CONTEXT:diagnosed with bronchitis by PCP, steroids, antibx, albuterol. Not helping. Also recent trip to [place] QUALITY: MODIFYING FACTORS:as above ASSOCIATED SYMPTOMS:more congestion, wheezing, fever TIMING:constant  Past Medical History  Diagnosis Date  . Asthma    Past Surgical History  Procedure Laterality Date  . Breast lumpectomy     History reviewed. No pertinent family history. Social History  Substance Use Topics  . Smoking status: Current Every Day Smoker -- 0.50 packs/day for 25 years    Types: Cigarettes  . Smokeless tobacco: None  . Alcohol Use: 1.8 oz/week    3 Shots of liquor per week   OB History    No data available     Review of Systems ROS +'ve cough wheezing  Denies: HEADACHE, NAUSEA, ABDOMINAL PAIN, CHEST PAIN, CONGESTION, DYSURIA,    Allergies  Nitrofurantoin and Sulfonamide derivatives  Home Medications   Prior to Admission medications   Medication Sig Start Date End Date Taking? Authorizing Provider  albuterol (PROVENTIL,VENTOLIN) 90 MCG/ACT inhaler Inhale 2 puffs into the lungs every 6 (six) hours as needed. Per Dr Velora Heckler, rescue inhaler    Historical Provider, MD  aspirin EC 81 MG tablet Take 81 mg by mouth daily.    Historical Provider, MD  beclomethasone (QVAR) 40 MCG/ACT inhaler Inhale 2 puffs into the lungs 2 (two) times daily.    Historical Provider, MD  benazepril-hydrochlorthiazide (LOTENSIN HCT) 20-25 MG per tablet TAKE 1 TABLET EVERY DAY 06/14/15   Eulas Post, MD  cetirizine (ZYRTEC) 10 MG chewable tablet Chew 10 mg by mouth daily.      Historical Provider, MD  citalopram (CELEXA) 20 MG tablet Take 0.5  tablets (10 mg total) by mouth daily. 06/14/15   Eulas Post, MD   Meds Ordered and Administered this Visit   Medications  albuterol (PROVENTIL) (2.5 MG/3ML) 0.083% nebulizer solution 2.5 mg (not administered)  ipratropium-albuterol (DUONEB) 0.5-2.5 (3) MG/3ML nebulizer solution 3 mL (not administered)    BP 162/110 mmHg  Pulse 67  Temp(Src) 99.4 F (37.4 C) (Oral)  Resp 18  SpO2 97% No data found.   Physical Exam  Constitutional: She is oriented to person, place, and time. She appears well-developed and well-nourished. No distress.  HENT:  Head: Normocephalic and atraumatic.  Eyes: Conjunctivae are normal.  Neck: Normal range of motion. Neck supple.  Cardiovascular: Normal rate, regular rhythm and normal heart sounds.   Pulmonary/Chest: Effort normal. She has wheezes. She has no rales. She exhibits no tenderness.  Abdominal: Soft.  Musculoskeletal: Normal range of motion.  Neurological: She is alert and oriented to person, place, and time.  Skin: Skin is warm and dry.  Psychiatric: She has a normal mood and affect. Her behavior is normal. Judgment and thought content normal.    ED Course  Procedures (including critical care time)  Labs Review Labs Reviewed - No data to display  Imaging Review Dg Chest 2 View  08/11/2015  CLINICAL DATA:  Wheezing. EXAM: CHEST  2 VIEW COMPARISON:  12/01/2013 FINDINGS: The heart size and mediastinal contours are within normal limits. Both lungs are clear. The visualized skeletal structures are unremarkable. Extensive gas in the colon. IMPRESSION: No active cardiopulmonary disease. Electronically Signed   By:  Lorriane Shire M.D.   On: 08/11/2015 17:15     Visual Acuity Review  Right Eye Distance:   Left Eye Distance:   Bilateral Distance:    Right Eye Near:   Left Eye Near:    Bilateral Near:         MDM  No diagnosis found. Pt is feeling better after duo neb. States in the past she has been on longer course of  steroids I have advised that at this time no new antibx needed, as she just completed zpak. She has plenty of albuterol at home Request return to work note for Tuesday Blood pressure rechecked prior to discharge and is improved    Konrad Felix, [place] 08/11/15 1835

## 2015-08-11 NOTE — Discharge Instructions (Signed)

## 2015-08-11 NOTE — ED Notes (Signed)
The patient presented to the [facility] with a complaint of a possible URI. She stated that 7 days ago she had an astham attack and went to see her regualr DR who prescribed a z-pack and steroids. The patient stated that she has not gotten any better.

## 2015-09-16 ENCOUNTER — Encounter: Payer: Self-pay | Admitting: Family Medicine

## 2015-09-16 ENCOUNTER — Ambulatory Visit (INDEPENDENT_AMBULATORY_CARE_PROVIDER_SITE_OTHER): Payer: BLUE CROSS/BLUE SHIELD | Admitting: Family Medicine

## 2015-09-16 VITALS — BP 90/60 | HR 104 | Temp 98.3°F | Resp 16 | Ht 65.0 in | Wt 123.0 lb

## 2015-09-16 DIAGNOSIS — Z111 Encounter for screening for respiratory tuberculosis: Secondary | ICD-10-CM | POA: Diagnosis not present

## 2015-09-16 DIAGNOSIS — F1010 Alcohol abuse, uncomplicated: Secondary | ICD-10-CM | POA: Diagnosis not present

## 2015-09-16 DIAGNOSIS — R053 Chronic cough: Secondary | ICD-10-CM

## 2015-09-16 DIAGNOSIS — R05 Cough: Secondary | ICD-10-CM

## 2015-09-16 MED ORDER — LEVOFLOXACIN 500 MG PO TABS: 500.0000 mg | ORAL_TABLET | Freq: Every day | ORAL | Status: DC

## 2015-09-16 NOTE — Progress Notes (Signed)
   Subjective:    Patient ID: Natalie Morrison, female    DOB: 10/10/1970, 44 y.o.   MRN: [id number]  HPI Persistent cough. Patient states she's had at least 6-8 weeks of cough. She was in the [place] back in October. When she returned she developed some wheezing Saw her allergist and received "cortisone shot" and Zithromax She did not feel much better and on November 6 went to urgent care and had chest x-ray which was unremarkable. Was given more steroids.  Went back to allergist and received antibiotic last week She thinks this was clarithromycin. She has bilateral earache left greater than right. Question low-grade fever past few weeks but not documented. Smokes about 5 cigarettes per day. Cough productive of clear sputum. Minimal postnasal drip. No GERD. Does take lisinopril for hypertension. Denies any hemoptysis or dyspnea. She does complain of some persistent pressure left maxillary sinus region.  Hx of ETOH abuse.  Still drinks Bourbon- about 6 ounces per day.  Past Medical History  Diagnosis Date  . Asthma    Past Surgical History  Procedure Laterality Date  . Breast lumpectomy      reports that she has been smoking Cigarettes.  She has a 12.5 pack-year smoking history. She does not have any smokeless tobacco history on file. She reports that she drinks about 1.8 oz of alcohol per week. She reports that she does not use illicit drugs. family history is not on file. Allergies  Allergen Reactions  . Nitrofurantoin     REACTION: hives  . Sulfonamide Derivatives       Review of Systems  Constitutional: Positive for fever. Negative for chills.  HENT: Positive for ear pain. Negative for congestion and hearing loss.   Respiratory: Positive for cough. Negative for shortness of breath and wheezing.   Cardiovascular: Negative for chest pain, palpitations and leg swelling.  Gastrointestinal: Negative for abdominal pain.  Genitourinary: Negative for dysuria.    Neurological: Negative for dizziness and seizures.  Hematological: Negative for adenopathy.       Objective:   Physical Exam  Constitutional: She is oriented to person, place, and time. She appears well-developed and well-nourished.  HENT:  Right Ear: External ear normal.  Left Ear: External ear normal.  Poor dentition.  Neck: Neck supple.  Cardiovascular: Normal rate and regular rhythm.   Pulmonary/Chest: Effort normal and breath sounds normal. No respiratory distress. She has no wheezes. She has no rales.  Musculoskeletal: She exhibits no edema.  Lymphadenopathy:    She has no cervical adenopathy.  Neurological: She is alert and oriented to person, place, and time.  Psychiatric: She has a normal mood and affect. Her behavior is normal.          Assessment & Plan:  Persistent/chronic cough in a long-term smoker. She has history of perennial allergies and maintained on Zyrtec. No recent GERD symptoms. She reports question of low-grade fever. Recent chest x-ray unremarkable. Differential is ACE inhibitor related cough, silent GERD, persistent sinusitis, post viral cough.  Check PPD-with recent travels Stop clarithromycin. Start Levaquin 500 milligrams once daily for 7 days for better H influenza coverage.  Continue Zyrtec Stop lisinopril. Recheck here 2 weeks. Consider chest x-ray repeat and move up pulmonary referral if not improved at that time  ETOH abuse.  She is strongly advised to quit.  We have offered support programs in past and motivation is low.

## 2015-09-16 NOTE — Progress Notes (Signed)
Pre visit review using our clinic review tool, if applicable. No additional management support is needed unless otherwise documented below in the visit note. 

## 2015-09-16 NOTE — Patient Instructions (Signed)
HOLD Lisinopril for now- this could be worsening your cough Hold Clarithromycin Start the Levaquin. Return in 2-3 days for PPD to be read.

## 2015-09-18 LAB — TB SKIN TEST
Induration: 48 mm
TB Skin Test: NEGATIVE

## 2015-09-21 ENCOUNTER — Encounter (HOSPITAL_COMMUNITY): Payer: Self-pay | Admitting: Emergency Medicine

## 2015-09-21 ENCOUNTER — Inpatient Hospital Stay (HOSPITAL_COMMUNITY)
Admission: EM | Admit: 2015-09-21 | Discharge: 2015-09-24 | DRG: 871 | Disposition: A | Discharge status: Home / Self Care | Payer: BLUE CROSS/BLUE SHIELD | Attending: Internal Medicine | Admitting: Internal Medicine

## 2015-09-21 ENCOUNTER — Emergency Department (HOSPITAL_COMMUNITY): Payer: BLUE CROSS/BLUE SHIELD

## 2015-09-21 ENCOUNTER — Inpatient Hospital Stay (HOSPITAL_COMMUNITY): Payer: BLUE CROSS/BLUE SHIELD

## 2015-09-21 DIAGNOSIS — E162 Hypoglycemia, unspecified: Secondary | ICD-10-CM | POA: Diagnosis present

## 2015-09-21 DIAGNOSIS — A419 Sepsis, unspecified organism: Principal | ICD-10-CM | POA: Diagnosis present

## 2015-09-21 DIAGNOSIS — Z6820 Body mass index (BMI) 20.0-20.9, adult: Secondary | ICD-10-CM | POA: Diagnosis not present

## 2015-09-21 DIAGNOSIS — E876 Hypokalemia: Secondary | ICD-10-CM | POA: Diagnosis present

## 2015-09-21 DIAGNOSIS — F419 Anxiety disorder, unspecified: Secondary | ICD-10-CM | POA: Diagnosis present

## 2015-09-21 DIAGNOSIS — E872 Acidosis: Secondary | ICD-10-CM | POA: Diagnosis present

## 2015-09-21 DIAGNOSIS — Z9181 History of falling: Secondary | ICD-10-CM | POA: Diagnosis not present

## 2015-09-21 DIAGNOSIS — K7200 Acute and subacute hepatic failure without coma: Secondary | ICD-10-CM | POA: Diagnosis present

## 2015-09-21 DIAGNOSIS — D6959 Other secondary thrombocytopenia: Secondary | ICD-10-CM | POA: Diagnosis present

## 2015-09-21 DIAGNOSIS — R6521 Severe sepsis with septic shock: Secondary | ICD-10-CM | POA: Diagnosis present

## 2015-09-21 DIAGNOSIS — F1010 Alcohol abuse, uncomplicated: Secondary | ICD-10-CM | POA: Diagnosis present

## 2015-09-21 DIAGNOSIS — F17210 Nicotine dependence, cigarettes, uncomplicated: Secondary | ICD-10-CM | POA: Diagnosis present

## 2015-09-21 DIAGNOSIS — J45909 Unspecified asthma, uncomplicated: Secondary | ICD-10-CM | POA: Diagnosis present

## 2015-09-21 DIAGNOSIS — N179 Acute kidney failure, unspecified: Secondary | ICD-10-CM | POA: Diagnosis present

## 2015-09-21 DIAGNOSIS — Z882 Allergy status to sulfonamides status: Secondary | ICD-10-CM | POA: Diagnosis not present

## 2015-09-21 DIAGNOSIS — T68XXXA Hypothermia, initial encounter: Secondary | ICD-10-CM | POA: Diagnosis not present

## 2015-09-21 DIAGNOSIS — Z888 Allergy status to other drugs, medicaments and biological substances status: Secondary | ICD-10-CM | POA: Diagnosis not present

## 2015-09-21 DIAGNOSIS — I4581 Long QT syndrome: Secondary | ICD-10-CM | POA: Diagnosis present

## 2015-09-21 DIAGNOSIS — E46 Unspecified protein-calorie malnutrition: Secondary | ICD-10-CM | POA: Diagnosis present

## 2015-09-21 DIAGNOSIS — J45901 Unspecified asthma with (acute) exacerbation: Secondary | ICD-10-CM | POA: Diagnosis not present

## 2015-09-21 DIAGNOSIS — I959 Hypotension, unspecified: Secondary | ICD-10-CM | POA: Diagnosis present

## 2015-09-21 DIAGNOSIS — R579 Shock, unspecified: Secondary | ICD-10-CM | POA: Diagnosis not present

## 2015-09-21 LAB — CBC WITH DIFFERENTIAL/PLATELET
Basophils Absolute: 0 10*3/uL (ref 0.0–0.1)
Basophils Relative: 0 %
Eosinophils Absolute: 0 10*3/uL (ref 0.0–0.7)
Eosinophils Relative: 0 %
HCT: 32.7 % — ABNORMAL LOW (ref 36.0–46.0)
Hemoglobin: 11.2 g/dL — ABNORMAL LOW (ref 12.0–15.0)
Lymphocytes Relative: 16 %
Lymphs Abs: 1.8 10*3/uL (ref 0.7–4.0)
MCH: 38.2 pg — ABNORMAL HIGH (ref 26.0–34.0)
MCHC: 34.3 g/dL (ref 30.0–36.0)
MCV: 111.6 fL — ABNORMAL HIGH (ref 78.0–100.0)
Monocytes Absolute: 1.7 10*3/uL — ABNORMAL HIGH (ref 0.1–1.0)
Monocytes Relative: 15 %
Neutro Abs: 7.8 10*3/uL — ABNORMAL HIGH (ref 1.7–7.7)
Neutrophils Relative %: 69 %
Platelets: 203 10*3/uL (ref 150–400)
RBC: 2.93 MIL/uL — ABNORMAL LOW (ref 3.87–5.11)
RDW: 13.2 % (ref 11.5–15.5)
WBC: 11.4 10*3/uL — ABNORMAL HIGH (ref 4.0–10.5)

## 2015-09-21 LAB — URINE MICROSCOPIC-ADD ON

## 2015-09-21 LAB — OSMOLALITY, URINE: Osmolality, Ur: 338 mOsm/kg (ref 300–900)

## 2015-09-21 LAB — COMPREHENSIVE METABOLIC PANEL
ALT: 63 U/L — ABNORMAL HIGH (ref 14–54)
AST: 58 U/L — ABNORMAL HIGH (ref 15–41)
Albumin: 3.9 g/dL (ref 3.5–5.0)
Alkaline Phosphatase: 36 U/L — ABNORMAL LOW (ref 38–126)
Anion gap: 27 — ABNORMAL HIGH (ref 5–15)
BUN: 26 mg/dL — ABNORMAL HIGH (ref 6–20)
CO2: 14 mmol/L — ABNORMAL LOW (ref 22–32)
Calcium: 8.6 mg/dL — ABNORMAL LOW (ref 8.9–10.3)
Chloride: 95 mmol/L — ABNORMAL LOW (ref 101–111)
Creatinine, Ser: 3.12 mg/dL — ABNORMAL HIGH (ref 0.44–1.00)
GFR calc Af Amer: 20 mL/min — ABNORMAL LOW (ref >60)
GFR calc non Af Amer: 17 mL/min — ABNORMAL LOW (ref >60)
Glucose, Bld: 49 mg/dL — ABNORMAL LOW (ref 65–99)
Potassium: 2.8 mmol/L — ABNORMAL LOW (ref 3.5–5.1)
Sodium: 136 mmol/L (ref 135–145)
Total Bilirubin: 1.2 mg/dL (ref 0.3–1.2)
Total Protein: 6.3 g/dL — ABNORMAL LOW (ref 6.5–8.1)

## 2015-09-21 LAB — URINALYSIS, ROUTINE W REFLEX MICROSCOPIC
Bilirubin Urine: NEGATIVE
Glucose, UA: NEGATIVE mg/dL
Ketones, ur: 15 mg/dL — AB
Leukocytes, UA: NEGATIVE
Nitrite: NEGATIVE
Protein, ur: 30 mg/dL — AB
Specific Gravity, Urine: 1.012 (ref 1.005–1.030)
pH: 5.5 (ref 5.0–8.0)

## 2015-09-21 LAB — I-STAT ARTERIAL BLOOD GAS, ED
Acid-base deficit: 19 mmol/L — ABNORMAL HIGH (ref 0.0–2.0)
Bicarbonate: 8.6 mEq/L — ABNORMAL LOW (ref 20.0–24.0)
O2 Saturation: 97 %
Patient temperature: 92.9
TCO2: 9 mmol/L (ref 0–100)
pCO2 arterial: 22.3 mmHg — ABNORMAL LOW (ref 35.0–45.0)
pH, Arterial: 7.176 — CL (ref 7.350–7.450)
pO2, Arterial: 105 mmHg — ABNORMAL HIGH (ref 80.0–100.0)

## 2015-09-21 LAB — I-STAT CG4 LACTIC ACID, ED
Lactic Acid, Venous: 2.64 mmol/L (ref 0.5–2.0)
Lactic Acid, Venous: 1.87 mmol/L (ref 0.5–2.0)

## 2015-09-21 LAB — CBG MONITORING, ED
Glucose-Capillary: 37 mg/dL — CL (ref 65–99)
Glucose-Capillary: 146 mg/dL — ABNORMAL HIGH (ref 65–99)

## 2015-09-21 LAB — LACTIC ACID, PLASMA: Lactic Acid, Venous: 1.1 mmol/L (ref 0.5–2.0)

## 2015-09-21 LAB — TROPONIN I
Troponin I: 0.07 ng/mL — ABNORMAL HIGH (ref <0.031)
Troponin I: 0.24 ng/mL — ABNORMAL HIGH (ref <0.031)

## 2015-09-21 LAB — ACETAMINOPHEN LEVEL: Acetaminophen (Tylenol), Serum: <10 ug/mL — ABNORMAL LOW (ref 10–30)

## 2015-09-21 LAB — PROTIME-INR
INR: 1.09 (ref 0.00–1.49)
Prothrombin Time: 14.3 seconds (ref 11.6–15.2)

## 2015-09-21 LAB — MRSA PCR SCREENING: MRSA by PCR: NEGATIVE

## 2015-09-21 LAB — CORTISOL: Cortisol, Plasma: 14.3 ug/dL

## 2015-09-21 LAB — RAPID URINE DRUG SCREEN, HOSP PERFORMED
Amphetamines: NOT DETECTED
Barbiturates: NOT DETECTED
Benzodiazepines: NOT DETECTED
Cocaine: NOT DETECTED
Opiates: NOT DETECTED
Tetrahydrocannabinol: NOT DETECTED

## 2015-09-21 LAB — TYPE AND SCREEN
ABO/RH(D): O POS
Antibody Screen: NEGATIVE

## 2015-09-21 LAB — SALICYLATE LEVEL: Salicylate Lvl: <4 mg/dL (ref 2.8–30.0)

## 2015-09-21 LAB — OSMOLALITY: Osmolality: 309 mOsm/kg — ABNORMAL HIGH (ref 275–295)

## 2015-09-21 LAB — I-STAT BETA HCG BLOOD, ED (MC, WL, AP ONLY): I-stat hCG, quantitative: <5 m[IU]/mL (ref <5)

## 2015-09-21 LAB — APTT: aPTT: 29 seconds (ref 24–37)

## 2015-09-21 LAB — PROCALCITONIN: Procalcitonin: 0.22 ng/mL

## 2015-09-21 MED ORDER — NOREPINEPHRINE BITARTRATE 1 MG/ML IV SOLN
2.0000 ug/min | INTRAVENOUS | Status: DC
  Administered 2015-09-21: 18 ug/min via INTRAVENOUS
  Administered 2015-09-21: 2 ug/min via INTRAVENOUS
  Filled 2015-09-21 (×2): qty 4

## 2015-09-21 MED ORDER — HYDROCORTISONE NA SUCCINATE PF 100 MG IJ SOLR
50.0000 mg | Freq: Four times a day (QID) | INTRAMUSCULAR | Status: DC
  Administered 2015-09-21 – 2015-09-22 (×3): 50 mg via INTRAVENOUS
  Filled 2015-09-21 (×3): qty 1
  Filled 2015-09-21 (×2): qty 2
  Filled 2015-09-21: qty 1

## 2015-09-21 MED ORDER — PIPERACILLIN-TAZOBACTAM IN DEX 2-0.25 GM/50ML IV SOLN
2.2500 g | Freq: Four times a day (QID) | INTRAVENOUS | Status: DC
  Filled 2015-09-21: qty 50

## 2015-09-21 MED ORDER — PIPERACILLIN-TAZOBACTAM 3.375 G IVPB 30 MIN
3.3750 g | Freq: Once | INTRAVENOUS | Status: AC
  Administered 2015-09-21: 3.375 g via INTRAVENOUS
  Filled 2015-09-21: qty 50

## 2015-09-21 MED ORDER — SODIUM CHLORIDE 0.9 % IV BOLUS (SEPSIS): 1000.0000 mL | Freq: Once | INTRAVENOUS | Status: DC

## 2015-09-21 MED ORDER — CITALOPRAM HYDROBROMIDE 20 MG PO TABS
10.0000 mg | ORAL_TABLET | Freq: Every day | ORAL | Status: DC
  Administered 2015-09-21 – 2015-09-23 (×3): 10 mg via ORAL
  Filled 2015-09-21 (×3): qty 1

## 2015-09-21 MED ORDER — VANCOMYCIN HCL 10 G IV SOLR
1250.0000 mg | Freq: Once | INTRAVENOUS | Status: AC
  Administered 2015-09-21: 1250 mg via INTRAVENOUS
  Filled 2015-09-21: qty 1250

## 2015-09-21 MED ORDER — DEXTROSE-NACL 5-0.9 % IV SOLN
INTRAVENOUS | Status: DC
  Administered 2015-09-21: 22:00:00 via INTRAVENOUS

## 2015-09-21 MED ORDER — DEXTROSE 50 % IV SOLN
50.0000 mL | Freq: Once | INTRAVENOUS | Status: AC
Start: 2015-09-21 — End: 2015-09-21
  Administered 2015-09-21: 50 mL via INTRAVENOUS
  Filled 2015-09-21: qty 50

## 2015-09-21 MED ORDER — ONDANSETRON HCL 4 MG/2ML IJ SOLN
4.0000 mg | Freq: Once | INTRAMUSCULAR | Status: AC
  Administered 2015-09-21: 4 mg via INTRAVENOUS
  Filled 2015-09-21: qty 2

## 2015-09-21 MED ORDER — DEXTROSE-NACL 5-0.9 % IV SOLN: INTRAVENOUS | Status: DC

## 2015-09-21 MED ORDER — ONDANSETRON HCL 4 MG/2ML IJ SOLN
4.0000 mg | Freq: Once | INTRAMUSCULAR | Status: AC
Start: 2015-09-21 — End: 2015-09-21
  Administered 2015-09-21: 4 mg via INTRAVENOUS
  Filled 2015-09-21: qty 2

## 2015-09-21 MED ORDER — HYDROCORTISONE NA SUCCINATE PF 100 MG IJ SOLR
100.0000 mg | Freq: Once | INTRAMUSCULAR | Status: AC
  Administered 2015-09-21: 100 mg via INTRAVENOUS
  Filled 2015-09-21: qty 2

## 2015-09-21 MED ORDER — PANTOPRAZOLE SODIUM 40 MG IV SOLR: 40.0000 mg | INTRAVENOUS | Status: DC

## 2015-09-21 MED ORDER — ALBUTEROL SULFATE (2.5 MG/3ML) 0.083% IN NEBU: 2.5000 mg | INHALATION_SOLUTION | RESPIRATORY_TRACT | Status: DC | PRN

## 2015-09-21 MED ORDER — PANTOPRAZOLE SODIUM 40 MG IV SOLR
40.0000 mg | Freq: Every day | INTRAVENOUS | Status: DC
  Administered 2015-09-21: 40 mg via INTRAVENOUS
  Filled 2015-09-21 (×2): qty 40

## 2015-09-21 MED ORDER — BUDESONIDE 0.25 MG/2ML IN SUSP
0.2500 mg | Freq: Two times a day (BID) | RESPIRATORY_TRACT | Status: DC
  Administered 2015-09-21 – 2015-09-24 (×6): 0.25 mg via RESPIRATORY_TRACT
  Filled 2015-09-21 (×8): qty 2

## 2015-09-21 MED ORDER — HEPARIN SODIUM (PORCINE) 5000 UNIT/ML IJ SOLN
5000.0000 [IU] | Freq: Three times a day (TID) | INTRAMUSCULAR | Status: DC
  Administered 2015-09-21 – 2015-09-24 (×7): 5000 [IU] via SUBCUTANEOUS
  Filled 2015-09-21 (×9): qty 1

## 2015-09-21 MED ORDER — ARFORMOTEROL TARTRATE 15 MCG/2ML IN NEBU
15.0000 ug | INHALATION_SOLUTION | Freq: Two times a day (BID) | RESPIRATORY_TRACT | Status: DC
  Administered 2015-09-21 – 2015-09-24 (×6): 15 ug via RESPIRATORY_TRACT
  Filled 2015-09-21 (×8): qty 2

## 2015-09-21 MED ORDER — DEXTROSE 5 % IV SOLN
5.0000 ug/min | INTRAVENOUS | Status: DC
  Filled 2015-09-21: qty 4

## 2015-09-21 MED ORDER — ACETAMINOPHEN 500 MG PO TABS
500.0000 mg | ORAL_TABLET | Freq: Four times a day (QID) | ORAL | Status: DC | PRN
  Administered 2015-09-21: 500 mg via ORAL

## 2015-09-21 MED ORDER — SODIUM CHLORIDE 0.9 % IV SOLN: 250.0000 mL | INTRAVENOUS | Status: DC | PRN

## 2015-09-21 MED ORDER — SODIUM CHLORIDE 0.9 % IV BOLUS (SEPSIS)
2000.0000 mL | Freq: Once | INTRAVENOUS | Status: AC
  Administered 2015-09-21: 2000 mL via INTRAVENOUS

## 2015-09-21 MED ORDER — INSULIN ASPART 100 UNIT/ML ~~LOC~~ SOLN
0.0000 [IU] | SUBCUTANEOUS | Status: DC
  Administered 2015-09-22: 1 [IU] via SUBCUTANEOUS

## 2015-09-21 MED ORDER — SODIUM CHLORIDE 0.9 % IV BOLUS (SEPSIS)
1000.0000 mL | INTRAVENOUS | Status: AC
  Administered 2015-09-21 (×2): 1000 mL via INTRAVENOUS

## 2015-09-21 NOTE — ED Provider Notes (Signed)
5:08 PM Care assumed  from outgoing team.  currently the patient has persistent hypotension, 60/40, hypoglycemia, with a blood glucose of less than 50, and is actively vomiting. Patient is receiving IV fluids,  vancomycin, antiemetics and has a passive warming device in place. Patient has already received Zosyn. Patient is awaiting levo fed from pharmacy.   Patient states that she was in the [place] about 6 weeks ago, prior to the onset of this illness. She acknowledges drinking about 3 drinks daily, and continues to smoke cigarettes. She denies other recent environmental or medical changes.  BP slightly improved 70/40  5:38 PM I discussed patient's case with critical care again. We discussed additional evaluation, including serum osmolarity, toxic alcohols given the patient's anion gap. On repeat exam the patient remains awake and alert, no ongoing encephalopathy. She and her husband are aware of all results thus far.   6:32 PM BP improving. Again we discussed the patient's case with critical care. Given the persistent hypotension, after appropriate fluid resuscitation, patient is having a central line for initiation of vasopressor.  arterial blood gas notable for acidosis, 7.1.   Carmin Muskrat, MD 09/21/15 [phone]

## 2015-09-21 NOTE — ED Notes (Addendum)
CBG 37. RN AND DR FLOYD/LOCKWOOD NOTIFIED.

## 2015-09-21 NOTE — Progress Notes (Addendum)
[facility] Progress Note Patient Name: SHOSHANAH AMOAH DOB: 08-Jan-1971 MRN: [id number]   Date of Service  09/21/2015  HPI/Events of Note  Very confusing clinical picture. Profound metabolic acidosis with elevated anion gap. Etiology not entirely clear. Calculated Osm = 284 and measured serum osm = 309 yielding an osmolar gap = 25. However, no ETOH level was drawn at any time and the measured osmolarity and BMP from which the osmolarity was calculated were from different times. ASA is normal. The last lactic acid level = 1.87.  Ethylene Glycol, ETOH, Methanol, Ketones and Isopropyl alcohol levels are all pending. The patient is currently awake and alert and will follow commands at this time. She is oriented X 3.   eICU Interventions  Will await toxic alcohol levels, monitor clinical status and re-check BMP to determine if acidosis is getting better or worse.      Intervention Category Major Interventions: Acid-Base disturbance - evaluation and management  Kemiyah Tarazon Eugene 09/21/2015, 9:42 PM

## 2015-09-21 NOTE — ED Notes (Signed)
CBG 148. Dr. Ree Kida.

## 2015-09-21 NOTE — ED Notes (Signed)
Dr. Floyd at bedside. 

## 2015-09-21 NOTE — ED Notes (Signed)
Pt notified of need for urine. ?

## 2015-09-21 NOTE — Progress Notes (Signed)
ANTIBIOTIC CONSULT NOTE - INITIAL  Pharmacy Consult for zosyn/vancomycin Indication: sepsis  Allergies  Allergen Reactions  . Nitrofurantoin Hives  . Other Other (See Comments)    Allergies to crab meat, mold, dust per allergy test  . Sulfonamide Derivatives Other (See Comments)    Unknown allergic reaction per husband     Patient Measurements: Height: 5\' 5"  (165.1 cm) Weight: 134 lb (60.782 kg) IBW/kg (Calculated) : 57  Vital Signs: Temp: 94.8 F (34.9 C) (12/17 1813) Temp Source: Rectal (12/17 1813) BP: 115/74 mmHg (12/17 2145) Pulse Rate: 90 (12/17 2145) Intake/Output from previous day:   Intake/Output from this shift: Total I/O In: 2509.1 [I.V.:259.1; IV Piggyback:2250] Out: 60 [Urine:60]  Labs:  Recent Labs  09/21/15 1534  WBC 11.4*  HGB 11.2*  PLT 203  CREATININE 3.12*   Estimated Creatinine Clearance: 20.5 mL/min (by C-G formula based on Cr of 3.12). No results for input(s): VANCOTROUGH, VANCOPEAK, VANCORANDOM, GENTTROUGH, GENTPEAK, GENTRANDOM, TOBRATROUGH, TOBRAPEAK, TOBRARND, AMIKACINPEAK, AMIKACINTROU, AMIKACIN in the last 72 hours.   Microbiology: No results found for this or any previous visit (from the past 720 hour(s)).  Medical History: Past Medical History  Diagnosis Date  . Asthma     Assessment: 44 yo female here with fall w/ syncope and and also noted hypothermix. Pharmacy consulted to dose zosyn and vancomycin for r/o sepsis. In ED, Zosyn 3.375gm given at ~ 1630 and Vancomycin 1250mg  IV given at ~ 1700 -WBC= 11.4, SCr= 3.12 (SCr= 1.08 in 06/2015) , CrCl ~ 20, LA 2.64>1.87  12/17 zosyn 12/17 vanc  12/17 blood x2 12/17 urine  Goal of Therapy:  Vancomycin trough level 15-20 mcg/ml  Plan:  -Zosyn 2.25gm IV q6h -Will follow renal trend in am for further vancomycin dosing -Will follow renal function, cultures and clinical progress  Hildred Laser, Pharm D 09/21/2015 10:05 PM

## 2015-09-21 NOTE — ED Notes (Signed)
Critical Care MD at bedside placing Central line

## 2015-09-21 NOTE — ED Notes (Signed)
Spoke with lab regarding blood draws for Ethylene glycol, volatiles, & osmolality

## 2015-09-21 NOTE — H&P (Signed)
PULMONARY / CRITICAL CARE MEDICINE   Name: Natalie Morrison MRN: [id number] DOB: 1971/10/01    ADMISSION DATE:  09/21/2015 CONSULTATION DATE:  09/21/15  REFERRING MD:  Dr Tyrone Nine, ED  CHIEF COMPLAINT:  Fall, weakness  HISTORY OF PRESENT ILLNESS:   44 yo woman, hx tobacco use, EtOH abuse, asthma. Has been dealing with persistent resp sx including wheeze and dyspnea, cough for about 2 months. Treated for suspected bronchitis, AE-asthma in October and November. She has had a low grade fever as well, ? Viral; syndrome over the last several days. Was started on levofloxacin on 12/12. She has been progressive nauseated x 2 days, poor PO intake, weakness. She fell and almost had syncope when standing today. Was brought to ED for eval, found to be hypothermic, hypotensive.  Labs show new acute renal failure. CXR clear.   PAST MEDICAL HISTORY :  She  has a past medical history of Asthma.  PAST SURGICAL HISTORY: She  has past surgical history that includes Breast lumpectomy.  Allergies  Allergen Reactions  . Nitrofurantoin     REACTION: hives  . Sulfonamide Derivatives     No current facility-administered medications on file prior to encounter.   Current Outpatient Prescriptions on File Prior to Encounter  Medication Sig  . albuterol (PROVENTIL,VENTOLIN) 90 MCG/ACT inhaler Inhale 2 puffs into the lungs every 6 (six) hours as needed. Per Dr Velora Heckler, rescue inhaler  . aspirin EC 81 MG tablet Take 81 mg by mouth daily.  . cetirizine (ZYRTEC) 10 MG chewable tablet Chew 10 mg by mouth daily.    . citalopram (CELEXA) 20 MG tablet Take 0.5 tablets (10 mg total) by mouth daily.  . Fluticasone Furoate-Vilanterol (BREO ELLIPTA) 100-25 MCG/INH AEPB Inhale into the lungs. ONE PUFF ONCE PER DAY  . ibuprofen (ADVIL,MOTRIN) 800 MG tablet   . levofloxacin (LEVAQUIN) 500 MG tablet Take 1 tablet (500 mg total) by mouth daily.    FAMILY HISTORY:  Her has no family status information on file.   SOCIAL  HISTORY: She  reports that she has been smoking Cigarettes.  She has a 12.5 pack-year smoking history. She does not have any smokeless tobacco history on file. She reports that she drinks about 1.8 oz of alcohol per week. She reports that she does not use illicit drugs.  REVIEW OF SYSTEMS:   Cough, dyspnea, nausea without emesis, dizziness, near-syncope on standing, possible low grade fevers Denies CP, congestion, HA, myalgias, wheezing.   SUBJECTIVE:  Feels weak, nauseated  VITAL SIGNS: BP 63/38 mmHg  Pulse 84  Temp(Src) 92.5 F (33.6 C) (Rectal)  Resp 17  Ht 5\' 5"  (1.651 m)  Wt 56.7 kg (125 lb)  BMI 20.80 kg/m2  SpO2 97%  LMP 07/22/2015  HEMODYNAMICS:    VENTILATOR SETTINGS:    INTAKE / OUTPUT:    PHYSICAL EXAMINATION: General:  Ill appearing woman, no resp distress, but appears weak Neuro:  Awake and fully oriented, non-focal HEENT:  Hirsutism, PERRL, OP dry Cardiovascular:  Tachy, regular,  Lungs:  Clear B  Abdomen:  Soft, NT, + BS Musculoskeletal:  No deformities, no edema Skin:  No rash  LABS:  BMET  Recent Labs Lab 09/21/15 1534  NA 136  K 2.8*  CL 95*  CO2 14*  BUN 26*  CREATININE 3.12*  GLUCOSE 49*    Electrolytes  Recent Labs Lab 09/21/15 1534  CALCIUM 8.6*    CBC  Recent Labs Lab 09/21/15 1534  WBC 11.4*  HGB 11.2*  HCT 32.7*  PLT 203    Coag's No results for input(s): APTT, INR in the last 168 hours.  Sepsis Markers  Recent Labs Lab 09/21/15 1552  LATICACIDVEN 2.64*    ABG No results for input(s): PHART, PCO2ART, PO2ART in the last 168 hours.  Liver Enzymes  Recent Labs Lab 09/21/15 1534  AST 58*  ALT 63*  ALKPHOS 36*  BILITOT 1.2  ALBUMIN 3.9    Cardiac Enzymes No results for input(s): TROPONINI, PROBNP in the last 168 hours.  Glucose No results for input(s): GLUCAP in the last 168 hours.  Imaging Dg Chest Port 1 View  09/21/2015  CLINICAL DATA:  Weakness, body aches, dizziness, emesis, asthma,  smoker EXAM: PORTABLE CHEST 1 VIEW COMPARISON:  Portable exam 1609 hours compared to 08/11/2015 FINDINGS: Slight rotation to the RIGHT. Normal heart size, mediastinal contours and pulmonary vascularity. Lungs clear. No pleural effusion or pneumothorax. Gaseous distention of colon under diaphragm again noted. IMPRESSION: No acute cardiopulmonary abnormalities. Electronically Signed   By: Lavonia Dana M.D.   On: 09/21/2015 16:44     STUDIES:  CXR 12/17 >> clear  CULTURES: Blood 12/17 >>  Urine 12/17 >>    ANTIBIOTICS: levaquin 12/12 >> 12/17 vanco 12/17 >>  Pip/tazo 12/17 >>   SIGNIFICANT EVENTS: 12/17 near syncope  LINES/TUBES:  DISCUSSION: 44 yo woman, hx tobacco and EtOH abuse, asthma. Presents with shock and new renal failure, suspect hypovolemic but must consider also septic shock, cardiogenic component.   ASSESSMENT / PLAN:  PULMONARY A: Hx asthma, no evidence exacerbation P:   - prn albuterol - Brovana + pulmicort, hold home breo  CARDIOVASCULAR A:  Shock, etiology unclear but suspect hypovolemia due to poor intake vs septic. Consider also cardiogenic but no current evidence to support P:  - follow lactate - IVF resuscitation - empiric abx as below -  cx's drawn - TTE to eval LV fxn - empiric stress dose steroids - follow troponin  RENAL A:   Acute renal failure AG metabolic acidosis, non lactate. ? Related to renal failure vs volatile alcohols P:   - volume resuscitation - follow BMP and UOP (currently oliguric), may need foley - check volatile alcohols.   GASTROINTESTINAL A:   Nausea  Mild transaminitis P:   - PPI for stress prophylaxis  HEMATOLOGIC A:   Mild leukocytosis  P:  - follow CBC  INFECTIOUS A:   No clear source of infxn, but presentation consistent w sepsis P:   - empiric vanco and pip/tazo - cx's drawn, will narrow based on data - PCT drawn  ENDOCRINE A:   Hypoglycemia   P:   - change maintenance fluids to D5NS -  follow CBGs  NEUROLOGIC A:   Lethargy Hx EtOH abuse at some risk for withdrawal P:   RASS goal: 0 - follow MS   FAMILY  - Updates: updated patient and husband in full at bedside  - Inter-disciplinary family meet or Palliative Care meeting due by:  12/24   Independent CC time 80 minutes excluding procedure time.    Baltazar Apo, MD, PhD 09/21/2015, 6:52 PM [facility] [phone] or if no answer [phone]

## 2015-09-21 NOTE — ED Notes (Signed)
Critical Care @ bedside °

## 2015-09-21 NOTE — ED Notes (Signed)
Husband stated, shes been sick for 5 weeks, she has seen 5 different doctors and said she has bronchio-asthma.  She has ear ache which they say is normal, . Very little appetite.  Pt. Stated, right now Im dizzy, achy, very cold .  Husband stated, she went to bathroom and just got weak and fell.  No LOC.

## 2015-09-21 NOTE — ED Notes (Signed)
Pt. Vomited x2.

## 2015-09-21 NOTE — ED Provider Notes (Signed)
CSN: [id number]     Arrival date & time 09/21/15  1454 History   First MD Initiated Contact with Patient 09/21/15 1540     Chief Complaint  Patient presents with  . Dizziness     (Consider location/radiation/quality/duration/timing/severity/associated sxs/prior Treatment) Patient is a 44 y.o. female presenting with dizziness and general illness. The history is provided by the patient and the spouse.  Dizziness Associated symptoms: nausea and shortness of breath   Associated symptoms: no chest pain, no headaches, no palpitations and no vomiting   Illness Severity:  Severe Onset quality:  Gradual Duration:  2 weeks Timing:  Constant Progression:  Worsening Chronicity:  New Associated symptoms: cough, loss of consciousness (near syncope), nausea and shortness of breath   Associated symptoms: no chest pain, no congestion, no fever, no headaches, no myalgias, no rhinorrhea, no vomiting and no wheezing     44 yo F with a chief complaint of near-syncope. Patient has had an upper respiratory infection going on for the past 2 months. Has seen her PCP multiple times for the same. Was recently started on Levaquin about 3 or 4 days ago. States she's been feeling nauseated for the past couple days and has had decreased oral intake. Patient almost passed out today when she stood up to go to the bathroom.  Past Medical History  Diagnosis Date  . Asthma    Past Surgical History  Procedure Laterality Date  . Breast lumpectomy     No family history on file. Social History  Substance Use Topics  . Smoking status: Current Every Day Smoker -- 0.50 packs/day for 25 years    Types: Cigarettes  . Smokeless tobacco: None  . Alcohol Use: 1.8 oz/week    3 Shots of liquor per week   OB History    No data available     Review of Systems  Constitutional: Negative for fever and chills.  HENT: Negative for congestion and rhinorrhea.   Eyes: Negative for redness and visual disturbance.   Respiratory: Positive for cough and shortness of breath. Negative for wheezing.   Cardiovascular: Negative for chest pain and palpitations.  Gastrointestinal: Positive for nausea. Negative for vomiting.  Genitourinary: Negative for dysuria and urgency.  Musculoskeletal: Negative for myalgias and arthralgias.  Skin: Negative for pallor and wound.  Neurological: Positive for dizziness, loss of consciousness (near syncope) and syncope (near). Negative for headaches.      Allergies  Nitrofurantoin and Sulfonamide derivatives  Home Medications   Prior to Admission medications   Medication Sig Start Date End Date Taking? Authorizing Provider  albuterol (PROVENTIL,VENTOLIN) 90 MCG/ACT inhaler Inhale 2 puffs into the lungs every 6 (six) hours as needed. Per Dr Velora Heckler, rescue inhaler    Historical Provider, MD  aspirin EC 81 MG tablet Take 81 mg by mouth daily.    Historical Provider, MD  cetirizine (ZYRTEC) 10 MG chewable tablet Chew 10 mg by mouth daily.      Historical Provider, MD  citalopram (CELEXA) 20 MG tablet Take 0.5 tablets (10 mg total) by mouth daily. 06/14/15   Eulas Post, MD  Fluticasone Furoate-Vilanterol (BREO ELLIPTA) 100-25 MCG/INH AEPB Inhale into the lungs. ONE PUFF ONCE PER DAY    Historical Provider, MD  ibuprofen (ADVIL,MOTRIN) 800 MG tablet  09/12/15   Historical Provider, MD  levofloxacin (LEVAQUIN) 500 MG tablet Take 1 tablet (500 mg total) by mouth daily. 09/16/15   Eulas Post, MD   BP 63/38 mmHg  Pulse 84  Temp(Src) 92.5  F (33.6 C) (Rectal)  Resp 17  Ht 5\' 5"  (1.651 m)  Wt 125 lb (56.7 kg)  BMI 20.80 kg/m2  SpO2 97%  LMP 07/22/2015 Physical Exam  Constitutional: She is oriented to person, place, and time. She appears well-developed and well-nourished. No distress.  HENT:  Head: Normocephalic and atraumatic.  Facial hair  Eyes: EOM are normal. Pupils are equal, round, and reactive to light.  Neck: Normal range of motion. Neck supple.   Cardiovascular: Normal rate, regular rhythm and intact distal pulses.  Exam reveals no gallop and no friction rub.   Murmur (best heard in the pulmonic pole) heard. Pulmonary/Chest: Effort normal. She has no wheezes. She has rhonchi in the left lower field. She has no rales.  Abdominal: Soft. She exhibits distension (mild). There is no tenderness. There is no rebound and no guarding.  Musculoskeletal: She exhibits no edema or tenderness.  Neurological: She is alert and oriented to person, place, and time.  Skin: Skin is warm and dry. She is not diaphoretic.  Psychiatric: She has a normal mood and affect. Her behavior is normal.  Nursing note and vitals reviewed.   ED Course  Procedures (including critical care time) Labs Review Labs Reviewed  COMPREHENSIVE METABOLIC PANEL - Abnormal; Notable for the following:    Potassium 2.8 (*)    Chloride 95 (*)    CO2 14 (*)    Glucose, Bld 49 (*)    BUN 26 (*)    Creatinine, Ser 3.12 (*)    Calcium 8.6 (*)    Total Protein 6.3 (*)    AST 58 (*)    ALT 63 (*)    Alkaline Phosphatase 36 (*)    GFR calc non Af Amer 17 (*)    GFR calc Af Amer 20 (*)    Anion gap 27 (*)    All other components within normal limits  CBC WITH DIFFERENTIAL/PLATELET - Abnormal; Notable for the following:    WBC 11.4 (*)    RBC 2.93 (*)    Hemoglobin 11.2 (*)    HCT 32.7 (*)    MCV 111.6 (*)    MCH 38.2 (*)    Neutro Abs 7.8 (*)    Monocytes Absolute 1.7 (*)    All other components within normal limits  I-STAT CG4 LACTIC ACID, ED - Abnormal; Notable for the following:    Lactic Acid, Venous 2.64 (*)    All other components within normal limits  CULTURE, BLOOD (ROUTINE X 2)  CULTURE, BLOOD (ROUTINE X 2)  URINE CULTURE  URINALYSIS, ROUTINE W REFLEX MICROSCOPIC (NOT AT [facility])  SALICYLATE LEVEL  ACETAMINOPHEN LEVEL  CORTISOL  I-STAT BETA HCG BLOOD, ED ([facility], [facility], [facility] ONLY)    Imaging Review Dg Chest Port 1 View  09/21/2015  CLINICAL DATA:  Weakness, body  aches, dizziness, emesis, asthma, smoker EXAM: PORTABLE CHEST 1 VIEW COMPARISON:  Portable exam 1609 hours compared to 08/11/2015 FINDINGS: Slight rotation to the RIGHT. Normal heart size, mediastinal contours and pulmonary vascularity. Lungs clear. No pleural effusion or pneumothorax. Gaseous distention of colon under diaphragm again noted. IMPRESSION: No acute cardiopulmonary abnormalities. Electronically Signed   By: Lavonia Dana M.D.   On: 09/21/2015 16:44   I have personally reviewed and evaluated these images and lab results as part of my medical decision-making.   EKG Interpretation   Date/Time:  Saturday September 21 2015 15:48:02 EST Ventricular Rate:  73 PR Interval:  155 QRS Duration: 133 QT Interval:  468 QTC  Calculation: 516 R Axis:   26 Text Interpretation:  Sinus rhythm Nonspecific intraventricular conduction  delay No significant change since last tracing Confirmed by Nikeya Maxim MD,  DANIEL [phone]) on 09/21/2015 3:52:32 PM       EMERGENCY DEPARTMENT [place] CARDIAC EXAM "Study: Limited Ultrasound of the heart and pericardium"  INDICATIONS:Hypotension Multiple views of the heart and pericardium are obtained with a multi-frequency probe.  PERFORMED [id number]  IMAGES ARCHIVED?: Yes  FINDINGS: No pericardial effusion, Hyperdynamic contractility, IVC flat and Tamponade physiology absent  LIMITATIONS:  Body habitus  VIEWS USED: Parasternal long axis and Inferior Vena Cava  INTERPRETATION: Cardiac activity present, Pericardial effusioin absent, Cardiac tamponade absent, Probable low CVP and Increased contractility  COMMENT:  Done with hypotension post 30cc/kg of IV fluid, patient with IVC collapse, will give 2 more L of IV fluids  MDM   Final diagnoses:  None    44 yo F with a chief complaints of chronic cough and near-syncope. Patient found to be profoundly hypotensive on exam with pressure in the 70 systolic with maps in the 40s. Patient appears to be chronically  ill-appearing denies IV drug abuse denies suicidal ideation. Blunted affect. We'll assume possible sepsis give 30 mL/kg of IV fluids. Obtain chest x-ray urine blood culture.    CRITICAL CARE Performed by: Cecilio Asper   Total critical care time: 76 minutes  Critical care time was exclusive of separately billable procedures and treating other patients.  Critical care was necessary to treat or prevent imminent or life-threatening deterioration.  Critical care was time spent personally by me on the following activities: development of treatment plan with patient and/or surrogate as well as nursing, discussions with consultants, evaluation of patient's response to treatment, examination of patient, obtaining history from patient or surrogate, ordering and performing treatments and interventions, ordering and review of laboratory studies, ordering and review of radiographic studies, pulse oximetry and re-evaluation of patient's condition.  Discussed case with critical care will come and eval the patient. Care turned over to Dr. Vanita Panda.     Deno Etienne, DO 09/21/15 [phone]

## 2015-09-21 NOTE — ED Notes (Signed)
Temperature would not read. Abdomen extended. Pt. Is on Levaquin

## 2015-09-22 ENCOUNTER — Inpatient Hospital Stay (HOSPITAL_COMMUNITY): Payer: BLUE CROSS/BLUE SHIELD

## 2015-09-22 DIAGNOSIS — R579 Shock, unspecified: Secondary | ICD-10-CM

## 2015-09-22 DIAGNOSIS — F1010 Alcohol abuse, uncomplicated: Secondary | ICD-10-CM

## 2015-09-22 LAB — VOLATILES,BLD-ACETONE,ETHANOL,ISOPROP,METHANOL
Acetone, blood: 0.01 % (ref 0.000–0.010)
Ethanol, blood: 0.056 % — ABNORMAL HIGH (ref 0.000–0.010)
Isopropanol, blood: NOT DETECTED % (ref 0.000–0.010)
Methanol, blood: NOT DETECTED % (ref 0.000–0.010)

## 2015-09-22 LAB — PROCALCITONIN: Procalcitonin: 0.27 ng/mL

## 2015-09-22 LAB — BASIC METABOLIC PANEL
Anion gap: 18 — ABNORMAL HIGH (ref 5–15)
BUN: 23 mg/dL — ABNORMAL HIGH (ref 6–20)
CO2: 14 mmol/L — ABNORMAL LOW (ref 22–32)
Calcium: 6.9 mg/dL — ABNORMAL LOW (ref 8.9–10.3)
Chloride: 103 mmol/L (ref 101–111)
Creatinine, Ser: 2.39 mg/dL — ABNORMAL HIGH (ref 0.44–1.00)
GFR calc Af Amer: 27 mL/min — ABNORMAL LOW (ref >60)
GFR calc non Af Amer: 23 mL/min — ABNORMAL LOW (ref >60)
Glucose, Bld: 161 mg/dL — ABNORMAL HIGH (ref 65–99)
Potassium: 3.1 mmol/L — ABNORMAL LOW (ref 3.5–5.1)
Sodium: 135 mmol/L (ref 135–145)

## 2015-09-22 LAB — COMPREHENSIVE METABOLIC PANEL
ALT: 47 U/L (ref 14–54)
AST: 39 U/L (ref 15–41)
Albumin: 3 g/dL — ABNORMAL LOW (ref 3.5–5.0)
Alkaline Phosphatase: 26 U/L — ABNORMAL LOW (ref 38–126)
Anion gap: 15 (ref 5–15)
BUN: 21 mg/dL — ABNORMAL HIGH (ref 6–20)
CO2: 16 mmol/L — ABNORMAL LOW (ref 22–32)
Calcium: 6.9 mg/dL — ABNORMAL LOW (ref 8.9–10.3)
Chloride: 104 mmol/L (ref 101–111)
Creatinine, Ser: 2.23 mg/dL — ABNORMAL HIGH (ref 0.44–1.00)
GFR calc Af Amer: 29 mL/min — ABNORMAL LOW (ref >60)
GFR calc non Af Amer: 25 mL/min — ABNORMAL LOW (ref >60)
Glucose, Bld: 146 mg/dL — ABNORMAL HIGH (ref 65–99)
Potassium: 2.9 mmol/L — ABNORMAL LOW (ref 3.5–5.1)
Sodium: 135 mmol/L (ref 135–145)
Total Bilirubin: 1.4 mg/dL — ABNORMAL HIGH (ref 0.3–1.2)
Total Protein: 4.9 g/dL — ABNORMAL LOW (ref 6.5–8.1)

## 2015-09-22 LAB — CBC
HCT: 28.4 % — ABNORMAL LOW (ref 36.0–46.0)
Hemoglobin: 10.2 g/dL — ABNORMAL LOW (ref 12.0–15.0)
MCH: 39.1 pg — ABNORMAL HIGH (ref 26.0–34.0)
MCHC: 35.9 g/dL (ref 30.0–36.0)
MCV: 108.8 fL — ABNORMAL HIGH (ref 78.0–100.0)
Platelets: 110 10*3/uL — ABNORMAL LOW (ref 150–400)
RBC: 2.61 MIL/uL — ABNORMAL LOW (ref 3.87–5.11)
RDW: 13 % (ref 11.5–15.5)
WBC: 11 10*3/uL — ABNORMAL HIGH (ref 4.0–10.5)

## 2015-09-22 LAB — BLOOD GAS, ARTERIAL
Acid-base deficit: 9.4 mmol/L — ABNORMAL HIGH (ref 0.0–2.0)
Bicarbonate: 14.7 mEq/L — ABNORMAL LOW (ref 20.0–24.0)
Drawn by: 36277
FIO2: 0.21
O2 Saturation: 97.2 %
Patient temperature: 98.6
TCO2: 15.5 mmol/L (ref 0–100)
pCO2 arterial: 25.4 mmHg — ABNORMAL LOW (ref 35.0–45.0)
pH, Arterial: 7.38 (ref 7.350–7.450)
pO2, Arterial: 90.8 mmHg (ref 80.0–100.0)

## 2015-09-22 LAB — LACTIC ACID, PLASMA
Lactic Acid, Venous: 1.1 mmol/L (ref 0.5–2.0)
Lactic Acid, Venous: 1.3 mmol/L (ref 0.5–2.0)

## 2015-09-22 LAB — ETHYLENE GLYCOL: Ethylene Glycol Lvl: NOT DETECTED mg/dL

## 2015-09-22 LAB — GLUCOSE, CAPILLARY
Glucose-Capillary: 122 mg/dL — ABNORMAL HIGH (ref 65–99)
Glucose-Capillary: 144 mg/dL — ABNORMAL HIGH (ref 65–99)
Glucose-Capillary: 177 mg/dL — ABNORMAL HIGH (ref 65–99)
Glucose-Capillary: 98 mg/dL (ref 65–99)

## 2015-09-22 LAB — TROPONIN I
Troponin I: 0.31 ng/mL — ABNORMAL HIGH (ref <0.031)
Troponin I: 0.32 ng/mL — ABNORMAL HIGH (ref <0.031)

## 2015-09-22 LAB — MAGNESIUM: Magnesium: 1.2 mg/dL — ABNORMAL LOW (ref 1.7–2.4)

## 2015-09-22 LAB — ABO/RH: ABO/RH(D): O POS

## 2015-09-22 LAB — CORTISOL: Cortisol, Plasma: 38.1 ug/dL

## 2015-09-22 LAB — PHOSPHORUS: Phosphorus: 3.8 mg/dL (ref 2.5–4.6)

## 2015-09-22 MED ORDER — ONDANSETRON HCL 4 MG/2ML IJ SOLN
4.0000 mg | Freq: Once | INTRAMUSCULAR | Status: AC
  Administered 2015-09-22: 4 mg via INTRAVENOUS
  Filled 2015-09-22: qty 2

## 2015-09-22 MED ORDER — INFLUENZA VAC SPLIT QUAD 0.5 ML IM SUSY
0.5000 mL | PREFILLED_SYRINGE | INTRAMUSCULAR | Status: DC
  Filled 2015-09-22: qty 0.5

## 2015-09-22 MED ORDER — PIPERACILLIN-TAZOBACTAM 3.375 G IVPB
3.3750 g | Freq: Three times a day (TID) | INTRAVENOUS | Status: DC
  Administered 2015-09-22 – 2015-09-24 (×6): 3.375 g via INTRAVENOUS
  Filled 2015-09-22 (×9): qty 50

## 2015-09-22 MED ORDER — SODIUM CHLORIDE 0.9 % IV SOLN
INTRAVENOUS | Status: DC
  Administered 2015-09-22 – 2015-09-24 (×3): via INTRAVENOUS

## 2015-09-22 MED ORDER — MAGNESIUM OXIDE 400 (241.3 MG) MG PO TABS
400.0000 mg | ORAL_TABLET | Freq: Every day | ORAL | Status: AC
  Administered 2015-09-22 – 2015-09-23 (×2): 400 mg via ORAL
  Filled 2015-09-22 (×2): qty 1

## 2015-09-22 MED ORDER — POTASSIUM CHLORIDE CRYS ER 20 MEQ PO TBCR
40.0000 meq | EXTENDED_RELEASE_TABLET | Freq: Two times a day (BID) | ORAL | Status: AC
  Administered 2015-09-22 (×2): 40 meq via ORAL
  Filled 2015-09-22 (×2): qty 2

## 2015-09-22 MED ORDER — ALPRAZOLAM 0.5 MG PO TABS: 0.5000 mg | ORAL_TABLET | Freq: Two times a day (BID) | ORAL | Status: DC | PRN

## 2015-09-22 MED ORDER — VANCOMYCIN HCL IN DEXTROSE 750-5 MG/150ML-% IV SOLN
750.0000 mg | INTRAVENOUS | Status: DC
  Administered 2015-09-22 – 2015-09-23 (×2): 750 mg via INTRAVENOUS
  Filled 2015-09-22 (×3): qty 150

## 2015-09-22 MED ORDER — ALPRAZOLAM 0.25 MG PO TABS
0.2500 mg | ORAL_TABLET | Freq: Two times a day (BID) | ORAL | Status: DC | PRN
  Administered 2015-09-22 – 2015-09-23 (×2): 0.25 mg via ORAL
  Filled 2015-09-22 (×2): qty 1

## 2015-09-22 NOTE — Progress Notes (Signed)
[place] for zosyn/vancomycin Indication: sepsis  Allergies  Allergen Reactions  . Nitrofurantoin Hives  . Other Other (See Comments)    Allergies to crab meat, mold, dust per allergy test  . Sulfonamide Derivatives Other (See Comments)    Unknown allergic reaction per husband     Patient Measurements: Height: 5\' 5"  (165.1 cm) Weight: 134 lb 4.2 oz (60.9 kg) IBW/kg (Calculated) : 57  Vital Signs: Temp: 97.6 F (36.4 C) (12/18 1255) Temp Source: Oral (12/18 1255) BP: 105/63 mmHg (12/18 0700) Pulse Rate: 77 (12/18 0800) Intake/Output from previous day: 12/17 0701 - 12/18 0700 In: 3540.3 [I.V.:1290.3; IV Piggyback:2250] Out: 2110 [Urine:2110] Intake/Output from this shift: Total I/O In: 400 [I.V.:400] Out: 150 [Urine:150]  Labs:  Recent Labs  09/21/15 1534 09/22/15 0055 09/22/15 0335  WBC 11.4*  --  11.0*  HGB 11.2*  --  10.2*  PLT 203  --  110*  CREATININE 3.12* 2.39* 2.23*   Estimated Creatinine Clearance: 28.7 mL/min (by C-G formula based on Cr of 2.23). No results for input(s): VANCOTROUGH, VANCOPEAK, VANCORANDOM, GENTTROUGH, GENTPEAK, GENTRANDOM, TOBRATROUGH, TOBRAPEAK, TOBRARND, AMIKACINPEAK, AMIKACINTROU, AMIKACIN in the last 72 hours.   Microbiology: Recent Results (from the past 720 hour(s))  Blood Culture (routine x 2)     Status: None (Preliminary result)   Collection Time: 09/21/15  3:29 PM  Result Value Ref Range Status   Specimen Description RIGHT ANTECUBITAL  Final   Special Requests BOTTLES DRAWN AEROBIC AND ANAEROBIC 5CC  Final   Culture NO GROWTH < 24 HOURS  Final   Report Status PENDING  Incomplete  Blood Culture (routine x 2)     Status: None (Preliminary result)   Collection Time: 09/21/15  3:31 PM  Result Value Ref Range Status   Specimen Description LEFT ANTECUBITAL  Final   Special Requests BOTTLES DRAWN AEROBIC AND ANAEROBIC 5CC  Final   Culture NO GROWTH < 24 HOURS  Final   Report Status PENDING   Incomplete  Urine culture     Status: None (Preliminary result)   Collection Time: 09/21/15  8:06 PM  Result Value Ref Range Status   Specimen Description URINE, RANDOM  Final   Special Requests NONE  Final   Culture NO GROWTH < 24 HOURS  Final   Report Status PENDING  Incomplete  MRSA PCR Screening     Status: None   Collection Time: 09/21/15  8:58 PM  Result Value Ref Range Status   MRSA by PCR NEGATIVE NEGATIVE Final    Comment:        The GeneXpert MRSA Assay (FDA approved for NASAL specimens only), is one component of a comprehensive MRSA colonization surveillance program. It is not intended to diagnose MRSA infection nor to guide or monitor treatment for MRSA infections.     Medical History: Past Medical History  Diagnosis Date  . Asthma     Assessment: 44 yo female here with fall w/ syncope and and also noted hypothermix. Pharmacy consulted to dose zosyn and vancomycin for r/o sepsis. In ED, Zosyn 3.375gm given at ~ 1630 and Vancomycin 1250mg  IV given at ~ 1700.  Scr now improving, est CrCl ~ 28 ml/min currently.    12/17 zosyn > 12/17 vanc >  12/17 blood x 2 > ngtd 12/17 urine > ngtd Goal of Therapy:  Vancomycin trough level 15-20 mcg/ml  Plan:  -Increase Zosyn to 3.375g IV q 8 hrs (extended interval infusion) -Vancomycin 750 mg IV q 24 hrs  for now, will need close monitoring given changing renal function. -Continue to monitor cultures, clinical course.  Uvaldo Rising, BCPS  Clinical Pharmacist Pager [phone]  09/22/2015 1:18 PM

## 2015-09-22 NOTE — Progress Notes (Signed)
[place] Progress Note Patient Name: SHARAYA PUJOLS DOB: 05/01/1971 MRN: [id number]   Date of Service  09/22/2015  HPI/Events of Note  History of ETOH with mild anxiety and restlessness.    eICU Interventions  Xanax as needed     Intervention Category Minor Interventions: Agitation / anxiety - evaluation and management  Mauri Brooklyn, P 09/22/2015, 10:48 PM

## 2015-09-22 NOTE — Progress Notes (Signed)
  Echocardiogram 2D Echocardiogram has been performed.  Natalie Morrison 09/22/2015, 8:48 AM

## 2015-09-22 NOTE — Progress Notes (Signed)
Patient asking for something for anxiety or to help her rest.  She is slightly tremulous.  MD paged.

## 2015-09-22 NOTE — Progress Notes (Signed)
Utilization Review Completed.Natalie Morrison T12/18/2016  

## 2015-09-22 NOTE — Progress Notes (Signed)
PULMONARY / CRITICAL CARE MEDICINE   Name: Natalie Morrison MRN: [id number] DOB: 1971-06-18    ADMISSION DATE:  09/21/2015 CONSULTATION DATE:  09/21/15  REFERRING MD:  Dr Tyrone Nine, ED  CHIEF COMPLAINT:  Fall, weakness  HISTORY OF PRESENT ILLNESS:   44 yo woman, hx tobacco use, EtOH abuse, asthma. Has been dealing with persistent resp sx including wheeze and dyspnea, cough for about 2 months. Treated for suspected bronchitis, AE-asthma in October and November. She has had a low grade fever as well, ? Viral; syndrome over the last several days. Was started on levofloxacin on 12/12. She has been progressive nauseated x 2 days, poor PO intake, weakness. She fell and almost had syncope when standing today. Was brought to ED for eval, found to be hypothermic, hypotensive.  Labs show new acute renal failure. CXR clear.   SUBJECTIVE:  Significant turn-around overnight > pressors off at midnight Poor appetite Some improvement in renal fxn, making urine  VITAL SIGNS: BP 105/63 mmHg  Pulse 77  Temp(Src) 97.7 F (36.5 C) (Oral)  Resp 19  Ht 5\' 5"  (1.651 m)  Wt 60.9 kg (134 lb 4.2 oz)  BMI 22.34 kg/m2  SpO2 98%  LMP 07/22/2015  HEMODYNAMICS:    VENTILATOR SETTINGS:    INTAKE / OUTPUT: I/O last 3 completed shifts: In: 3540.3 [I.V.:1290.3; IV Piggyback:2250] Out: 2110 [Urine:2110]  PHYSICAL EXAMINATION: General:  Ill appearing woman, no resp distress, stronger  Neuro:  Awake and fully oriented, non-focal HEENT:  Hirsutism, PERRL, OP dry Cardiovascular:  Tachy, regular,  Lungs:  Clear B  Abdomen:  Soft, NT, + BS Musculoskeletal:  No deformities, no edema Skin:  No rash  LABS:  BMET  Recent Labs Lab 09/21/15 1534 09/22/15 0055 09/22/15 0335  NA 136 135 135  K 2.8* 3.1* 2.9*  CL 95* 103 104  CO2 14* 14* 16*  BUN 26* 23* 21*  CREATININE 3.12* 2.39* 2.23*  GLUCOSE 49* 161* 146*    Electrolytes  Recent Labs Lab 09/21/15 1534 09/22/15 0055 09/22/15 0335  CALCIUM  8.6* 6.9* 6.9*  MG  --   --  1.2*  PHOS  --   --  3.8    CBC  Recent Labs Lab 09/21/15 1534 09/22/15 0335  WBC 11.4* 11.0*  HGB 11.2* 10.2*  HCT 32.7* 28.4*  PLT 203 110*    Coag's  Recent Labs Lab 09/21/15 2240  APTT 29  INR 1.09    Sepsis Markers  Recent Labs Lab 09/21/15 2240 09/22/15 0123 09/22/15 0335  LATICACIDVEN 1.1 1.1 1.3  PROCALCITON 0.22  --  0.27    ABG  Recent Labs Lab 09/21/15 1810 09/22/15 0400  PHART 7.176* 7.380  PCO2ART 22.3* 25.4*  PO2ART 105.0* 90.8    Liver Enzymes  Recent Labs Lab 09/21/15 1534 09/22/15 0335  AST 58* 39  ALT 63* 47  ALKPHOS 36* 26*  BILITOT 1.2 1.4*  ALBUMIN 3.9 3.0*    Cardiac Enzymes  Recent Labs Lab 09/21/15 2240 09/22/15 0055 09/22/15 0609  TROPONINI 0.24* 0.31* 0.32*    Glucose  Recent Labs Lab 09/21/15 1703 09/21/15 1812 09/22/15 0008 09/22/15 0355 09/22/15 0832  GLUCAP 37* 146* 177* 144* 98    Imaging Dg Chest Port 1 View  09/22/2015  CLINICAL DATA:  44 year old female with sepsis. EXAM: PORTABLE CHEST 1 VIEW COMPARISON:  09/21/2015 and prior exams FINDINGS: The cardiomediastinal silhouette is unremarkable. A right IJ central venous catheter with tip overlying mid SVC again noted. There is no evidence of  focal airspace disease, pulmonary edema, suspicious pulmonary nodule/mass, pleural effusion, or pneumothorax. No acute bony abnormalities are identified. IMPRESSION: No evidence of acute cardiopulmonary disease. Right central venous catheter again noted. Electronically Signed   By: Margarette Canada M.D.   On: 09/22/2015 08:08   Dg Chest Portable 1 View  09/21/2015  CLINICAL DATA:  Central line placement. EXAM: PORTABLE CHEST 1 VIEW COMPARISON:  Chest x-rays from earlier same day and 08/11/2015. FINDINGS: Cardiomediastinal silhouette remains normal in size and configuration. Right sided internal jugular central line now in place with tip adequately positioned in the upper aspects of the  superior vena cava. No pneumothorax seen. Lungs remain clear (right costophrenic angle excluded). No pleural effusions seen. IMPRESSION: 1. A right internal jugular central line is now in place with tip in the upper aspects of the superior vena cava. No pneumothorax. 2. Lungs remain clear. Electronically Signed   By: Franki Cabot M.D.   On: 09/21/2015 18:56   Dg Chest Port 1 View  09/21/2015  CLINICAL DATA:  Weakness, body aches, dizziness, emesis, asthma, smoker EXAM: PORTABLE CHEST 1 VIEW COMPARISON:  Portable exam 1609 hours compared to 08/11/2015 FINDINGS: Slight rotation to the RIGHT. Normal heart size, mediastinal contours and pulmonary vascularity. Lungs clear. No pleural effusion or pneumothorax. Gaseous distention of colon under diaphragm again noted. IMPRESSION: No acute cardiopulmonary abnormalities. Electronically Signed   By: Lavonia Dana M.D.   On: 09/21/2015 16:44     STUDIES:  CXR 12/17 >> clear  CULTURES: Blood 12/17 >>  Urine 12/17 >>    ANTIBIOTICS: levaquin 12/12 >> 12/17 vanco 12/17 >>  Pip/tazo 12/17 >>   SIGNIFICANT EVENTS: 12/17 near syncope  LINES/TUBES: R IJ CVC 12/17 >> 12/18  DISCUSSION: 44 yo woman, hx tobacco and EtOH abuse, asthma. Presented with shock and new renal failure, suspect hypovolemic but must consider also septic shock, cardiogenic component.  Entire presentation could be explained by dehydration, starvation ketosis adn EtOH use / abuse.   ASSESSMENT / PLAN:  PULMONARY A: Hx asthma, no evidence exacerbation P:   - prn albuterol - Brovana + pulmicort, hold home breo  CARDIOVASCULAR A:  Shock, etiology unclear but suspect hypovolemia due to poor intake vs septic. Consider also cardiogenic but no current evidence to support Stress-NSTEMI P:  - IVF resuscitation, decrease IVF 12/18 and attempt POs - empiric abx as below - TTE to eval LV fxn - empiric stress dose steroids, d/c on 12/18 and follow - follow another trop on 12/19 am,  defer heparin for now as suspect demand related  RENAL A:   Acute renal failure AG metabolic acidosis, non lactate. ? Related to renal failure vs volatile alcohols P:   - volume resuscitation - follow BMP and UOP (currently oliguric), may need foley - volatile alcohols > positive for EtOH only   GASTROINTESTINAL A:   Nausea  Mild transaminitis, resolved likely shock liver P:   - PPI for stress prophylaxis  HEMATOLOGIC A:   Mild leukocytosis  P:  - follow CBC  INFECTIOUS A:   No clear source of infxn, but presentation consistent w sepsis P:   - empiric vanco and pip/tazo - cx's drawn, will narrow based on data  ENDOCRINE  Recent Labs Lab 09/21/15 1703 09/21/15 1812 09/22/15 0008 09/22/15 0355 09/22/15 0832  GLUCAP 37* 146* 177* 144* 98  A:   Hypoglycemia   P:   - change maintenance fluids to NS - follow CBGs  NEUROLOGIC A:   Lethargy, much improved  Hx EtOH abuse at some risk for withdrawal, note EtOH positive on presentation P:   RASS goal: 0 - follow MS - start CIWA scoring if she clinically changes   FAMILY  - Updates: updated patient and husband in full at bedside 12/17  - Inter-disciplinary family meet or Palliative Care meeting due by:  12/24     Baltazar Apo, MD, PhD 09/22/2015, 10:49 AM [facility] [phone] or if no answer [phone]

## 2015-09-23 DIAGNOSIS — I959 Hypotension, unspecified: Secondary | ICD-10-CM | POA: Diagnosis present

## 2015-09-23 DIAGNOSIS — E876 Hypokalemia: Secondary | ICD-10-CM

## 2015-09-23 DIAGNOSIS — N179 Acute kidney failure, unspecified: Secondary | ICD-10-CM | POA: Diagnosis present

## 2015-09-23 LAB — COMPREHENSIVE METABOLIC PANEL
ALT: 36 U/L (ref 14–54)
AST: 38 U/L (ref 15–41)
Albumin: 2.7 g/dL — ABNORMAL LOW (ref 3.5–5.0)
Alkaline Phosphatase: 24 U/L — ABNORMAL LOW (ref 38–126)
Anion gap: 7 (ref 5–15)
BUN: 8 mg/dL (ref 6–20)
CO2: 25 mmol/L (ref 22–32)
Calcium: 7.6 mg/dL — ABNORMAL LOW (ref 8.9–10.3)
Chloride: 109 mmol/L (ref 101–111)
Creatinine, Ser: 0.91 mg/dL (ref 0.44–1.00)
GFR calc Af Amer: >60 mL/min (ref >60)
GFR calc non Af Amer: >60 mL/min (ref >60)
Glucose, Bld: 92 mg/dL (ref 65–99)
Potassium: 2.6 mmol/L — CL (ref 3.5–5.1)
Sodium: 141 mmol/L (ref 135–145)
Total Bilirubin: 0.9 mg/dL (ref 0.3–1.2)
Total Protein: 4.6 g/dL — ABNORMAL LOW (ref 6.5–8.1)

## 2015-09-23 LAB — CBC
HCT: 26.6 % — ABNORMAL LOW (ref 36.0–46.0)
Hemoglobin: 9.6 g/dL — ABNORMAL LOW (ref 12.0–15.0)
MCH: 39.5 pg — ABNORMAL HIGH (ref 26.0–34.0)
MCHC: 36.1 g/dL — ABNORMAL HIGH (ref 30.0–36.0)
MCV: 109.5 fL — ABNORMAL HIGH (ref 78.0–100.0)
Platelets: 68 10*3/uL — ABNORMAL LOW (ref 150–400)
RBC: 2.43 MIL/uL — ABNORMAL LOW (ref 3.87–5.11)
RDW: 13.4 % (ref 11.5–15.5)
WBC: 6.7 10*3/uL (ref 4.0–10.5)

## 2015-09-23 LAB — GLUCOSE, CAPILLARY: Glucose-Capillary: 193 mg/dL — ABNORMAL HIGH (ref 65–99)

## 2015-09-23 LAB — URINE CULTURE: Culture: NO GROWTH

## 2015-09-23 LAB — POTASSIUM: Potassium: 3.5 mmol/L (ref 3.5–5.1)

## 2015-09-23 LAB — PROCALCITONIN: Procalcitonin: 0.18 ng/mL

## 2015-09-23 LAB — MAGNESIUM: Magnesium: 1.4 mg/dL — ABNORMAL LOW (ref 1.7–2.4)

## 2015-09-23 MED ORDER — LORAZEPAM 1 MG PO TABS: 1.0000 mg | ORAL_TABLET | Freq: Four times a day (QID) | ORAL | Status: DC | PRN

## 2015-09-23 MED ORDER — LORAZEPAM 2 MG/ML IJ SOLN: 1.0000 mg | Freq: Four times a day (QID) | INTRAMUSCULAR | Status: DC | PRN

## 2015-09-23 MED ORDER — ADULT MULTIVITAMIN W/MINERALS CH
1.0000 | ORAL_TABLET | Freq: Every day | ORAL | Status: DC
  Administered 2015-09-23 – 2015-09-24 (×2): 1 via ORAL
  Filled 2015-09-23 (×2): qty 1

## 2015-09-23 MED ORDER — THIAMINE HCL 100 MG/ML IJ SOLN: 100.0000 mg | Freq: Every day | INTRAMUSCULAR | Status: DC

## 2015-09-23 MED ORDER — ENSURE ENLIVE PO LIQD
237.0000 mL | Freq: Two times a day (BID) | ORAL | Status: DC
  Administered 2015-09-23: 237 mL via ORAL

## 2015-09-23 MED ORDER — POTASSIUM CHLORIDE CRYS ER 20 MEQ PO TBCR
40.0000 meq | EXTENDED_RELEASE_TABLET | ORAL | Status: AC
  Administered 2015-09-23 (×2): 40 meq via ORAL
  Filled 2015-09-23 (×2): qty 2

## 2015-09-23 MED ORDER — FOLIC ACID 1 MG PO TABS
1.0000 mg | ORAL_TABLET | Freq: Every day | ORAL | Status: DC
  Administered 2015-09-23 – 2015-09-24 (×2): 1 mg via ORAL
  Filled 2015-09-23 (×2): qty 1

## 2015-09-23 MED ORDER — POTASSIUM CHLORIDE 10 MEQ/100ML IV SOLN
10.0000 meq | INTRAVENOUS | Status: AC
  Administered 2015-09-23 (×4): 10 meq via INTRAVENOUS
  Filled 2015-09-23 (×4): qty 100

## 2015-09-23 MED ORDER — VITAMIN B-1 100 MG PO TABS
100.0000 mg | ORAL_TABLET | Freq: Every day | ORAL | Status: DC
  Administered 2015-09-23 – 2015-09-24 (×2): 100 mg via ORAL
  Filled 2015-09-23 (×2): qty 1

## 2015-09-23 NOTE — Progress Notes (Signed)
[facility]                                                                              Patient Demographics  Natalie Morrison, is a 44 y.o. female, DOB - [birth year], GR:[id number]  Admit date - 09/21/2015   Admitting Physician Collene Gobble, MD  Outpatient Primary MD for the patient is Eulas Post, MD  LOS - 2   Chief Complaint  Patient presents with  . Dizziness       Brief HPI   Patient was admitted by CCM on 12/17. Per admit note 44 yo woman, hx tobacco use, EtOH abuse, asthma. Has been dealing with persistent resp sx including wheeze and dyspnea, cough for about 2 months. Treated for suspected bronchitis, AE-asthma in October and November. She has had a low grade fever as well, ? Viral; syndrome over the last several days. Was started on levofloxacin on 12/12. She has been progressive nauseated x 2 days, poor PO intake, weakness. She fell and almost had syncope when standing on the day of admission. Was brought to ED for eval, found to be hypothermic, hypotensive. Labs show new acute renal failure. CXR clear.  Patient was transferred to hospitalist service on 09/23/15.  Assessment & Plan       Septic shock ([place]) unclear etiology, possibly hypovolemic shock due to poor by mouth intake VS septic versus cardiogenic - Patient was placed on aggressive IV fluid hydration, empiric stress dose steroids which were discontinued on 12/18 - Patient currently on empiric broad-spectrum antibiotics - 2-D echo Showed EF of 65-70%, normal wall motion - UA negative for UTI, chest x-ray negative for pneumonia   alcohol abuse:  - Somewhat tremulous today, Concern for acute alcohol withdrawals - Placed on ciwa protocol with ativan, thiamine, MVI, folate   acute kidney injury creatinine 3.12 at the time of admission with potassium 2.8, bicarbonate 14:  - Likely due to poor by mouth intake, also on benazepril/HCTZ, NSAIDs - Patient received aggressive IV fluid  hydration,  creatinine has improved 2.91  Hypokalemia - Obtain magnesium level, on 12/18 was 1.2, patient placed on oral magnesium supplementation - Placed on IV and oral potassium replacement, recheck later today  Protein calorie malnutrition, deconditioning - PT OT evaluation  Mild transaminitis - Likely due to shock liver from #1  Asthma Currently no wheezing or dyspnea, stable  Code Status:Full CODE STATUS  Family Communication: Discussed in detail with the patient, all imaging results, lab results explained to the patient   Disposition Plan:  DC next 24-48 hours  Time Spent in minutes   25 minutes  Procedures  None   Consults   CCM  DVT Prophylaxis  heparin   Medications  Scheduled Meds: . arformoterol  15 mcg Nebulization BID  . budesonide (PULMICORT) nebulizer solution  0.25 mg Nebulization BID  . citalopram  10 mg Oral Daily  . heparin  5,000 Units Subcutaneous 3 times per day  . Influenza vac split quadrivalent PF  0.5 mL Intramuscular Tomorrow-1000  . piperacillin-tazobactam (ZOSYN)  IV  3.375 g Intravenous 3 times per day  . potassium chloride  10 mEq Intravenous Q1  Hr x 4  . potassium chloride  40 mEq Oral Q4H  . vancomycin  750 mg Intravenous Q24H   Continuous Infusions: . sodium chloride 50 mL/hr at 09/23/15 0949   PRN Meds:.sodium chloride, acetaminophen, albuterol, ALPRAZolam   Antibiotics   Anti-infectives    Start     Dose/Rate Route Frequency Ordered Stop   09/22/15 2300  piperacillin-tazobactam (ZOSYN) IVPB 2.25 g  Status:  Discontinued     2.25 g 100 mL/hr over 30 Minutes Intravenous 4 times per day 09/21/15 2209 09/22/15 1316   09/22/15 1330  piperacillin-tazobactam (ZOSYN) IVPB 3.375 g     3.375 g 12.5 mL/hr over 240 Minutes Intravenous 3 times per day 09/22/15 1316     09/22/15 1330  vancomycin (VANCOCIN) IVPB 750 mg/150 ml premix     750 mg 150 mL/hr over 60 Minutes Intravenous Every 24 hours 09/22/15 1316     09/21/15 1700   vancomycin (VANCOCIN) 1,250 mg in sodium chloride 0.9 % 250 mL IVPB     1,250 mg 166.7 mL/hr over 90 Minutes Intravenous  Once 09/21/15 1621 09/21/15 1905   09/21/15 1630  piperacillin-tazobactam (ZOSYN) IVPB 3.375 g     3.375 g 100 mL/hr over 30 Minutes Intravenous  Once 09/21/15 1621 09/21/15 1702        Subjective:   Natalie Morrison was seen and examined today.   somewhat shaky and anxious today. BP still soft.  Patient denies dizziness, chest pain, shortness of breath, abdominal pain, N/V/D/C, new weakness, numbess, tingling. No acute events overnight.    Objective:   Blood pressure 99/57, pulse 66, temperature 97.7 F (36.5 C), temperature source Oral, resp. rate 16, height 5\' 5"  (1.651 m), weight 61.5 kg (135 lb 9.3 oz), last menstrual period 07/22/2015, SpO2 100 %.  Wt Readings from Last 3 Encounters:  09/23/15 61.5 kg (135 lb 9.3 oz)  09/16/15 55.792 kg (123 lb)  06/14/15 56.7 kg (125 lb)     Intake/Output Summary (Last 24 hours) at 09/23/15 1246 Last data filed at 09/23/15 0429  Gross per 24 hour  Intake    730 ml  Output   2100 ml  Net  -1370 ml    Exam  General: Alert and oriented x 3, NAD  HEENT:  PERRLA, EOMI, Anicteric Sclera, mucous membranes moist.   Neck: Supple, no JVD, no masses  CVS: S1 S2 auscultated, no rubs, murmurs or gallops. Regular rate and rhythm.  Respiratory: Clear to auscultation bilaterally, no wheezing, rales or rhonchi  Abdomen: Soft, nontender, nondistended, + bowel sounds  Ext: no cyanosis clubbing or edema, tremulous  Neuro: AAOx3, Cr N's II- XII. Strength 5/5 upper and lower extremities bilaterally  Skin: No rashes  Psych: Normal affect and demeanor, alert and oriented x3    Data Review   Micro Results Recent Results (from the past 240 hour(s))  Blood Culture (routine x 2)     Status: None (Preliminary result)   Collection Time: 09/21/15  3:29 PM  Result Value Ref Range Status   Specimen Description BLOOD RIGHT  ANTECUBITAL  Final   Special Requests BOTTLES DRAWN AEROBIC AND ANAEROBIC 5CC  Final   Culture NO GROWTH 2 DAYS  Final   Report Status PENDING  Incomplete  Blood Culture (routine x 2)     Status: None (Preliminary result)   Collection Time: 09/21/15  3:31 PM  Result Value Ref Range Status   Specimen Description BLOOD LEFT ANTECUBITAL  Final   Special Requests BOTTLES DRAWN AEROBIC AND  ANAEROBIC 5CC  Final   Culture NO GROWTH 2 DAYS  Final   Report Status PENDING  Incomplete  Urine culture     Status: None   Collection Time: 09/21/15  8:06 PM  Result Value Ref Range Status   Specimen Description URINE, RANDOM  Final   Special Requests NONE  Final   Culture NO GROWTH 2 DAYS  Final   Report Status 09/23/2015 FINAL  Final  MRSA PCR Screening     Status: None   Collection Time: 09/21/15  8:58 PM  Result Value Ref Range Status   MRSA by PCR NEGATIVE NEGATIVE Final    Comment:        The GeneXpert MRSA Assay (FDA approved for NASAL specimens only), is one component of a comprehensive MRSA colonization surveillance program. It is not intended to diagnose MRSA infection nor to guide or monitor treatment for MRSA infections.     Radiology Reports Dg Chest Port 1 View  09/22/2015  CLINICAL DATA:  44 year old female with sepsis. EXAM: PORTABLE CHEST 1 VIEW COMPARISON:  09/21/2015 and prior exams FINDINGS: The cardiomediastinal silhouette is unremarkable. A right IJ central venous catheter with tip overlying mid SVC again noted. There is no evidence of focal airspace disease, pulmonary edema, suspicious pulmonary nodule/mass, pleural effusion, or pneumothorax. No acute bony abnormalities are identified. IMPRESSION: No evidence of acute cardiopulmonary disease. Right central venous catheter again noted. Electronically Signed   By: Margarette Canada M.D.   On: 09/22/2015 08:08   Dg Chest Portable 1 View  09/21/2015  CLINICAL DATA:  Central line placement. EXAM: PORTABLE CHEST 1 VIEW COMPARISON:   Chest x-rays from earlier same day and 08/11/2015. FINDINGS: Cardiomediastinal silhouette remains normal in size and configuration. Right sided internal jugular central line now in place with tip adequately positioned in the upper aspects of the superior vena cava. No pneumothorax seen. Lungs remain clear (right costophrenic angle excluded). No pleural effusions seen. IMPRESSION: 1. A right internal jugular central line is now in place with tip in the upper aspects of the superior vena cava. No pneumothorax. 2. Lungs remain clear. Electronically Signed   By: Franki Cabot M.D.   On: 09/21/2015 18:56   Dg Chest Port 1 View  09/21/2015  CLINICAL DATA:  Weakness, body aches, dizziness, emesis, asthma, smoker EXAM: PORTABLE CHEST 1 VIEW COMPARISON:  Portable exam 1609 hours compared to 08/11/2015 FINDINGS: Slight rotation to the RIGHT. Normal heart size, mediastinal contours and pulmonary vascularity. Lungs clear. No pleural effusion or pneumothorax. Gaseous distention of colon under diaphragm again noted. IMPRESSION: No acute cardiopulmonary abnormalities. Electronically Signed   By: Lavonia Dana M.D.   On: 09/21/2015 16:44    CBC  Recent Labs Lab 09/21/15 1534 09/22/15 0335 09/23/15 1130  WBC 11.4* 11.0* 6.7  HGB 11.2* 10.2* 9.6*  HCT 32.7* 28.4* 26.6*  PLT 203 110* 68*  MCV 111.6* 108.8* 109.5*  MCH 38.2* 39.1* 39.5*  MCHC 34.3 35.9 36.1*  RDW 13.2 13.0 13.4  LYMPHSABS 1.8  --   --   MONOABS 1.7*  --   --   EOSABS 0.0  --   --   BASOSABS 0.0  --   --     Chemistries   Recent Labs Lab 09/21/15 1534 09/22/15 0055 09/22/15 0335 09/23/15 1130  NA 136 135 135 141  K 2.8* 3.1* 2.9* 2.6*  CL 95* 103 104 109  CO2 14* 14* 16* 25  GLUCOSE 49* 161* 146* 92  BUN 26* 23* 21*  8  CREATININE 3.12* 2.39* 2.23* 0.91  CALCIUM 8.6* 6.9* 6.9* 7.6*  MG  --   --  1.2*  --   AST 58*  --  39 38  ALT 63*  --  47 36  ALKPHOS 36*  --  26* 24*  BILITOT 1.2  --  1.4* 0.9    ------------------------------------------------------------------------------------------------------------------ estimated creatinine clearance is 70.2 mL/min (by C-G formula based on Cr of 0.91). ------------------------------------------------------------------------------------------------------------------ No results for input(s): HGBA1C in the last 72 hours. ------------------------------------------------------------------------------------------------------------------ No results for input(s): CHOL, HDL, LDLCALC, TRIG, CHOLHDL, LDLDIRECT in the last 72 hours. ------------------------------------------------------------------------------------------------------------------ No results for input(s): TSH, T4TOTAL, T3FREE, THYROIDAB in the last 72 hours.  Invalid input(s): FREET3 ------------------------------------------------------------------------------------------------------------------ No results for input(s): VITAMINB12, FOLATE, FERRITIN, TIBC, IRON, RETICCTPCT in the last 72 hours.  Coagulation profile  Recent Labs Lab 09/21/15 2240  INR 1.09    No results for input(s): DDIMER in the last 72 hours.  Cardiac Enzymes  Recent Labs Lab 09/21/15 2240 09/22/15 0055 09/22/15 0609  TROPONINI 0.24* 0.31* 0.32*   ------------------------------------------------------------------------------------------------------------------ Invalid input(s): POCBNP   Recent Labs  09/21/15 1812 09/21/15 2040 09/22/15 0008 09/22/15 0355 09/22/15 0832 09/22/15 Virgil M.D. [facility] 09/23/2015, 12:46 PM  Pager: [phone] Between 7am to 7pm - call Pager - [phone]  After 7pm go to [email or web address] - password [email or web address]  Call night coverage person covering after 7pm

## 2015-09-23 NOTE — Progress Notes (Signed)
CRITICAL VALUE ALERT  Critical value received:  Potassium 2.6  Date of notification:  09/23/15  Time of notification:  1222  Critical value read back: yes  Nurse who received alert:  Malva Limes  MD notified (1st page):  Dr. Tana Coast Time of first page:  1224  MD notified (2nd page):  Time of second page:  Responding MD:   Time MD responded:

## 2015-09-23 NOTE — Progress Notes (Signed)
Initial Nutrition Assessment  DOCUMENTATION CODES:   Non-severe (moderate) malnutrition in context of acute illness/injury  INTERVENTION:  -Ensure Enlive po BID, each supplement provides 350 kcal and 20 grams of protein   NUTRITION DIAGNOSIS:   Inadequate oral intake related to poor appetite, nausea, vomiting as evidenced by per patient/family report.  GOAL:   Patient will meet greater than or equal to 90% of their needs  MONITOR:   PO intake, Labs, I & O's, Supplement acceptance  REASON FOR ASSESSMENT:   Malnutrition Screening Tool    ASSESSMENT:   44 yo woman, hx tobacco use, EtOH abuse, asthma. Has been dealing with persistent resp sx including wheeze and dyspnea, cough for about 2 months. Treated for suspected bronchitis, AE-asthma in October and November. She has had a low grade fever as well, ? Viral; syndrome over the last several days. Was started on levofloxacin on 12/12. She has been progressive nauseated x 2 days, poor PO intake, weakness. She fell and almost had syncope when standing today. Was brought to ED for eval, found to be hypothermic, hypotensive. Labs show new acute renal failure. CXR clear.   Spoke with pt and husband at bedside. Pt reports having poor po for quite a few weeks. She has had multiple chest infections over the course of 6 weeks.   Reports good appetite when on corticosteroids for infections, but it would wane after coming off of them.  Nutrition-Focused physical exam completed. Findings are moderate fat depletion, mild muscle depletion, and no edema.   Pt would like Ensure Enlive BID. Currently on CLD. Will provide when advanced to full liquids.  Labs and Medications reviewed   Diet Order:  Diet clear liquid Room service appropriate?: Yes; Fluid consistency:: Thin  Skin:  Reviewed, no issues  Last BM:  09/19/2015  Height:   Ht Readings from Last 1 Encounters:  09/21/15 5\' 5"  (1.651 m)    Weight:   Wt Readings from Last 1  Encounters:  09/23/15 135 lb 9.3 oz (61.5 kg)    Ideal Body Weight:  56.81 kg  BMI:  Body mass index is 22.56 kg/(m^2).  Estimated Nutritional Needs:   Kcal:  1500-1850 calories  Protein:  60-70 grams  Fluid:  >/= 1.5L  EDUCATION NEEDS:   No education needs identified at this time  Satira Anis. Khristen Cheyney, MS, RD LDN After Hours/Weekend Pager [phone]

## 2015-09-24 ENCOUNTER — Telehealth: Payer: Self-pay | Admitting: Emergency Medicine

## 2015-09-24 DIAGNOSIS — I959 Hypotension, unspecified: Secondary | ICD-10-CM

## 2015-09-24 DIAGNOSIS — J45901 Unspecified asthma with (acute) exacerbation: Secondary | ICD-10-CM

## 2015-09-24 LAB — CBC
HCT: 27 % — ABNORMAL LOW (ref 36.0–46.0)
Hemoglobin: 9.6 g/dL — ABNORMAL LOW (ref 12.0–15.0)
MCH: 39.3 pg — ABNORMAL HIGH (ref 26.0–34.0)
MCHC: 35.6 g/dL (ref 30.0–36.0)
MCV: 110.7 fL — ABNORMAL HIGH (ref 78.0–100.0)
Platelets: 73 10*3/uL — ABNORMAL LOW (ref 150–400)
RBC: 2.44 MIL/uL — ABNORMAL LOW (ref 3.87–5.11)
RDW: 13.5 % (ref 11.5–15.5)
WBC: 6.5 10*3/uL (ref 4.0–10.5)

## 2015-09-24 LAB — COMPREHENSIVE METABOLIC PANEL
ALT: 49 U/L (ref 14–54)
AST: 64 U/L — ABNORMAL HIGH (ref 15–41)
Albumin: 2.6 g/dL — ABNORMAL LOW (ref 3.5–5.0)
Alkaline Phosphatase: 31 U/L — ABNORMAL LOW (ref 38–126)
Anion gap: 11 (ref 5–15)
BUN: 5 mg/dL — ABNORMAL LOW (ref 6–20)
CO2: 20 mmol/L — ABNORMAL LOW (ref 22–32)
Calcium: 8.1 mg/dL — ABNORMAL LOW (ref 8.9–10.3)
Chloride: 111 mmol/L (ref 101–111)
Creatinine, Ser: 0.66 mg/dL (ref 0.44–1.00)
GFR calc Af Amer: >60 mL/min (ref >60)
GFR calc non Af Amer: >60 mL/min (ref >60)
Glucose, Bld: 84 mg/dL (ref 65–99)
Potassium: 3.3 mmol/L — ABNORMAL LOW (ref 3.5–5.1)
Sodium: 142 mmol/L (ref 135–145)
Total Bilirubin: 0.6 mg/dL (ref 0.3–1.2)
Total Protein: 4.6 g/dL — ABNORMAL LOW (ref 6.5–8.1)

## 2015-09-24 MED ORDER — POTASSIUM CHLORIDE CRYS ER 20 MEQ PO TBCR: 20.0000 meq | EXTENDED_RELEASE_TABLET | Freq: Every day | ORAL | Status: DC

## 2015-09-24 MED ORDER — BUDESONIDE 0.25 MG/2ML IN SUSP: 0.2500 mg | Freq: Two times a day (BID) | RESPIRATORY_TRACT | Status: DC

## 2015-09-24 MED ORDER — PROMETHAZINE HCL 12.5 MG PO TABS: 12.5000 mg | ORAL_TABLET | Freq: Four times a day (QID) | ORAL | Status: DC | PRN

## 2015-09-24 MED ORDER — POTASSIUM CHLORIDE CRYS ER 20 MEQ PO TBCR: 40.0000 meq | EXTENDED_RELEASE_TABLET | Freq: Once | ORAL | Status: DC

## 2015-09-24 MED ORDER — MAGNESIUM SULFATE 50 % IJ SOLN
3.0000 g | Freq: Once | INTRAMUSCULAR | Status: AC
  Administered 2015-09-24: 3 g via INTRAVENOUS
  Filled 2015-09-24: qty 6

## 2015-09-24 MED ORDER — UNABLE TO FIND: Status: DC

## 2015-09-24 MED ORDER — THIAMINE HCL 100 MG PO TABS: 100.0000 mg | ORAL_TABLET | Freq: Every day | ORAL | Status: DC

## 2015-09-24 MED ORDER — POTASSIUM CHLORIDE CRYS ER 20 MEQ PO TBCR
40.0000 meq | EXTENDED_RELEASE_TABLET | Freq: Every day | ORAL | Status: DC
  Administered 2015-09-24: 40 meq via ORAL
  Filled 2015-09-24: qty 2

## 2015-09-24 MED ORDER — DEXTROSE 5 % IV SOLN: 3.0000 g | Freq: Once | INTRAVENOUS | Status: DC

## 2015-09-24 MED ORDER — AMOXICILLIN-POT CLAVULANATE 875-125 MG PO TABS: 1.0000 | ORAL_TABLET | Freq: Two times a day (BID) | ORAL | Status: DC

## 2015-09-24 MED ORDER — AMOXICILLIN-POT CLAVULANATE 875-125 MG PO TABS
1.0000 | ORAL_TABLET | Freq: Two times a day (BID) | ORAL | Status: DC
  Administered 2015-09-24: 1 via ORAL
  Filled 2015-09-24: qty 1

## 2015-09-24 MED ORDER — ARFORMOTEROL TARTRATE 15 MCG/2ML IN NEBU: 15.0000 ug | INHALATION_SOLUTION | Freq: Two times a day (BID) | RESPIRATORY_TRACT | Status: DC

## 2015-09-24 MED ORDER — ALPRAZOLAM 0.25 MG PO TABS: 0.2500 mg | ORAL_TABLET | Freq: Every day | ORAL | Status: DC | PRN

## 2015-09-24 NOTE — Discharge Summary (Signed)
Physician Discharge Summary   Patient ID: SHERRYE COLEGROVE MRN: [id number] DOB/AGE: Jan 30, 1971 44 y.o.  Admit date: 09/21/2015 Discharge date: 09/24/2015  Primary Care Physician:  Eulas Post, MD  Discharge Diagnoses:    . Septic shock ([place]) . Alcohol abuse . Asthma exacerbation . Hypotension . Acute kidney injury ([place])   Generalized debility   Anxiety    Prolonged QTC  Consults: Patient was admitted by critical care service   Recommendations for Outpatient Follow-up:  1. Outpatient PT was arranged 2. Please repeat CBC/BMET at next visit 3. Please follow QTC, Celexa was discontinued    DIET: Regular diet   Allergies:   Allergies  Allergen Reactions  . Nitrofurantoin Hives  . Other Other (See Comments)    Allergies to crab meat, mold, dust per allergy test  . Sulfonamide Derivatives Other (See Comments)    Unknown allergic reaction per husband      DISCHARGE MEDICATIONS: Current Discharge Medication List    START taking these medications   Details  ALPRAZolam (XANAX) 0.25 MG tablet Take 1 tablet (0.25 mg total) by mouth daily as needed for anxiety. Qty: 30 tablet, Refills: 0    amoxicillin-clavulanate (AUGMENTIN) 875-125 MG tablet Take 1 tablet by mouth 2 (two) times daily. X 7days Qty: 14 tablet, Refills: 0    arformoterol (BROVANA) 15 MCG/2ML NEBU Take 2 mLs (15 mcg total) by nebulization 2 (two) times daily. Qty: 120 mL, Refills: 6    budesonide (PULMICORT) 0.25 MG/2ML nebulizer solution Take 2 mLs (0.25 mg total) by nebulization 2 (two) times daily. Qty: 60 mL, Refills: 12    potassium chloride SA (K-DUR,KLOR-CON) 20 MEQ tablet Take 1 tablet (20 mEq total) by mouth daily. Qty: 7 tablet, Refills: 0    promethazine (PHENERGAN) 12.5 MG tablet Take 1 tablet (12.5 mg total) by mouth every 6 (six) hours as needed for nausea or vomiting. Qty: 30 tablet, Refills: 0    thiamine 100 MG tablet Take 1 tablet (100 mg total) by mouth daily. Qty: 30  tablet, Refills: 3    UNABLE TO FIND Outpatient physical therapy  Dx: generalized debility Qty: 1 Mutually Defined, Refills: 0      CONTINUE these medications which have NOT CHANGED   Details  albuterol (PROVENTIL,VENTOLIN) 90 MCG/ACT inhaler Inhale 2 puffs into the lungs every 6 (six) hours as needed for wheezing or shortness of breath. Per Dr Velora Heckler, rescue inhaler    cetirizine (ZYRTEC) 10 MG chewable tablet Chew 10 mg by mouth daily.      Cyanocobalamin (VITAMIN B-12 PO) Take 1 tablet by mouth daily.    GLUCOSAMINE-CHONDROITIN PO Take 1 tablet by mouth daily.    Multiple Vitamin (MULTIVITAMIN WITH MINERALS) TABS tablet Take 1 tablet by mouth daily. One a Day Women's    Multiple Vitamins-Minerals (AIRBORNE PO) Take 1 tablet by mouth daily.      STOP taking these medications     benazepril-hydrochlorthiazide (LOTENSIN HCT) 20-25 MG tablet      citalopram (CELEXA) 20 MG tablet      Fluticasone Furoate-Vilanterol (BREO ELLIPTA) 200-25 MCG/INH AEPB      ibuprofen (ADVIL,MOTRIN) 800 MG tablet      levofloxacin (LEVAQUIN) 500 MG tablet          Brief H and P: For complete details please refer to admission H and P, but in brief Patient was admitted by CCM on 12/17. Per admit note 44 yo woman, hx tobacco use, EtOH abuse, asthma. Has been dealing with persistent resp sx  including wheeze and dyspnea, cough for about 2 months. Treated for suspected bronchitis, AE-asthma in October and November. She has had a low grade fever as well, ? Viral; syndrome over the last several days. Was started on levofloxacin on 12/12. She has been progressive nauseated x 2 days, poor PO intake, weakness. She fell and almost had syncope when standing on the day of admission. Was brought to ED for eval, found to be hypothermic, hypotensive. Labs show new acute renal failure. CXR clear.  Patient was transferred to hospitalist service on 09/23/15.  Hospital Course:   Septic shock ([place]) unclear  etiology, possibly hypovolemic shock due to poor by mouth intake VS septic versus cardiogenic - Patient was placed on aggressive IV fluid hydration, and admitted to intensive care unit by critical care service. She was also placed on empiric stress dose steroids which were discontinued on 12/18 - Patient was placed on IV empiric broad-spectrum antibiotics, source unclear, transitioned to oral Augmentin for 7 days.  - 2-D echo Showed EF of 65-70%, normal wall motion - UA negative for UTI, chest x-ray negative for pneumonia however patient did have coughing noticed during the encounter.  Prolonged QTC Patient had recently finished Levaquin course, also on Celexa. Celexa was discontinued. His follow QTC outpatient.  alcohol abuse:  - Patient was placed on ciwa protocol with ativan, thiamine, MVI, folate. Currently stable and ambulating with physical therapy.  acute kidney injury creatinine 3.12 at the time of admission with potassium 2.8, bicarbonate 14:  - Likely due to poor by mouth intake, also on benazepril/HCTZ, NSAIDs. Patient received aggressive IV fluid hydration, creatinine has improved to 0.06 at the time of discharge.  Hypokalemia - Magnesium 1.4, patient was placed on oral and IV magnesium replacement. She was also given prescription for oral potassium, 7 days please check BMET at the time of follow-up appointment.   Protein calorie malnutrition, deconditioning - PT OT evaluation  Mild transaminitis - Likely due to shock liver from #1  Asthma Currently no wheezing or dyspnea, stable. However patient was recommended to stop Breo inhaler and was placed on Brovana and Pulmicort by pulmonology. I called the office to make a follow-up appointment however I was notified that patient will be called for appointment sooner.  Thrombocytopenia Likely due to alcohol abuse and septic shock, please follow CBC closely   Day of Discharge BP 111/77 mmHg  Pulse 69  Temp(Src) 97.7 F  (36.5 C) (Oral)  Resp 18  Ht 5\' 5"  (1.651 m)  Wt 63.1 kg (139 lb 1.8 oz)  BMI 23.15 kg/m2  SpO2 94%  LMP 07/22/2015  Physical Exam: General: Alert and awake oriented x3 not in any acute distress. HEENT: anicteric sclera, pupils reactive to light and accommodation CVS: S1-S2 clear no murmur rubs or gallops Chest: clear to auscultation bilaterally, no wheezing rales or rhonchi Abdomen: soft nontender, nondistended, normal bowel sounds Extremities: no cyanosis, clubbing or edema noted bilaterally Neuro: Cranial nerves II-XII intact, no focal neurological deficits   The results of significant diagnostics from this hospitalization (including imaging, microbiology, ancillary and laboratory) are listed below for reference.    LAB RESULTS: Basic Metabolic Panel:  Recent Labs Lab 09/22/15 0335 09/23/15 1130 09/23/15 1422 09/23/15 1819 09/24/15 0529  NA 135 141  --   --  142  K 2.9* 2.6*  --  3.5 3.3*  CL 104 109  --   --  111  CO2 16* 25  --   --  20*  GLUCOSE 146* 92  --   --  84  BUN 21* 8  --   --  5*  CREATININE 2.23* 0.91  --   --  0.66  CALCIUM 6.9* 7.6*  --   --  8.1*  MG 1.2*  --  1.4*  --   --   PHOS 3.8  --   --   --   --    Liver Function Tests:  Recent Labs Lab 09/23/15 1130 09/24/15 0529  AST 38 64*  ALT 36 49  ALKPHOS 24* 31*  BILITOT 0.9 0.6  PROT 4.6* 4.6*  ALBUMIN 2.7* 2.6*   No results for input(s): LIPASE, AMYLASE in the last 168 hours. No results for input(s): AMMONIA in the last 168 hours. CBC:  Recent Labs Lab 09/21/15 1534  09/23/15 1130 09/24/15 0529  WBC 11.4*  < > 6.7 6.5  NEUTROABS 7.8*  --   --   --   HGB 11.2*  < > 9.6* 9.6*  HCT 32.7*  < > 26.6* 27.0*  MCV 111.6*  < > 109.5* 110.7*  PLT 203  < > 68* 73*  < > = values in this interval not displayed. Cardiac Enzymes:  Recent Labs Lab 09/22/15 0055 09/22/15 0609  TROPONINI 0.31* 0.32*   BNP: Invalid input(s): POCBNP CBG:  Recent Labs Lab 09/22/15 0832  09/22/15 1256  GLUCAP 98 122*    Significant Diagnostic Studies:  Dg Chest Port 1 View  09/22/2015  CLINICAL DATA:  44 year old female with sepsis. EXAM: PORTABLE CHEST 1 VIEW COMPARISON:  09/21/2015 and prior exams FINDINGS: The cardiomediastinal silhouette is unremarkable. A right IJ central venous catheter with tip overlying mid SVC again noted. There is no evidence of focal airspace disease, pulmonary edema, suspicious pulmonary nodule/mass, pleural effusion, or pneumothorax. No acute bony abnormalities are identified. IMPRESSION: No evidence of acute cardiopulmonary disease. Right central venous catheter again noted. Electronically Signed   By: Margarette Canada M.D.   On: 09/22/2015 08:08   Dg Chest Portable 1 View  09/21/2015  CLINICAL DATA:  Central line placement. EXAM: PORTABLE CHEST 1 VIEW COMPARISON:  Chest x-rays from earlier same day and 08/11/2015. FINDINGS: Cardiomediastinal silhouette remains normal in size and configuration. Right sided internal jugular central line now in place with tip adequately positioned in the upper aspects of the superior vena cava. No pneumothorax seen. Lungs remain clear (right costophrenic angle excluded). No pleural effusions seen. IMPRESSION: 1. A right internal jugular central line is now in place with tip in the upper aspects of the superior vena cava. No pneumothorax. 2. Lungs remain clear. Electronically Signed   By: Franki Cabot M.D.   On: 09/21/2015 18:56   Dg Chest Port 1 View  09/21/2015  CLINICAL DATA:  Weakness, body aches, dizziness, emesis, asthma, smoker EXAM: PORTABLE CHEST 1 VIEW COMPARISON:  Portable exam 1609 hours compared to 08/11/2015 FINDINGS: Slight rotation to the RIGHT. Normal heart size, mediastinal contours and pulmonary vascularity. Lungs clear. No pleural effusion or pneumothorax. Gaseous distention of colon under diaphragm again noted. IMPRESSION: No acute cardiopulmonary abnormalities. Electronically Signed   By: Lavonia Dana M.D.    On: 09/21/2015 16:44    2D ECHO:   Disposition and Follow-up: Discharge Instructions    Ambulatory referral to Physical Therapy    Complete by:  As directed      Diet - low sodium heart healthy    Complete by:  As directed      Discharge instructions    Complete by:  As directed   Please STOP  celexa, can cause heart arrhthymias  Please use the new nebulizers recommended by the pulmonologist and follow outpatient. Use albuterol inhaler as rescue inhaler. HOLD your inhaler, Breo while on the new nebulizers.     Increase activity slowly    Complete by:  As directed             DISPOSITION: Home with outpatient physical therapy  DISCHARGE FOLLOW-UP Follow-up Information    Follow up with [facility], MD. Schedule an appointment as soon as possible for a visit in 10 days.   Specialty:  Pulmonary Disease   Why:  for hospital follow-up. Office will call you to work-in sooner appointment for hospital follow-up    Contact information:   [place] [address] [phone]       Follow up with Eulas Post, MD. Schedule an appointment as soon as possible for a visit in 10 days.   Specialty:  Family Medicine   Why:  for hospital follow-up   Contact information:   [place] [id number] [phone]       Follow up with [facility]   Specialty:  Rehabilitation   Why:  call if you do not hear them in 2 business days    Contact information:   [address] [id number] [place]       Time spent on Discharge: 40 minutes  Signed:   Roann Merk M.D. [facility] 09/24/2015, 10:13 AM Pager: [phone]

## 2015-09-24 NOTE — Progress Notes (Signed)
Patient discharged to home with instructions, sent nebulizer machine and tubings as ordered.

## 2015-09-24 NOTE — Care Management Note (Signed)
Case Management Note  Patient Details  Name: Natalie Morrison MRN: [id number] Date of Birth: [birth year]  Subjective/Objective:                    Action/Plan:  Referral for OP PT entered in EPIC  Expected Discharge Date:                  Expected Discharge Plan:  Home/Self Care  In-House Referral:     Discharge planning Services  CM Consult  Post Acute Care Choice:  Durable Medical Equipment Choice offered to:     DME Arranged:  Albertina Parr machine DME Agency:  [place]:    [facility] Agency:     Status of Service:  Completed, signed off  Medicare Important Message Given:    Date Medicare IM Given:    Medicare IM give by:    Date Additional Medicare IM Given:    Additional Medicare Important Message give by:     If discussed at [place] of Stay Meetings, dates discussed:    Additional Comments:  Marilu Favre, RN 09/24/2015, 9:35 AM

## 2015-09-24 NOTE — Evaluation (Signed)
Physical Therapy Evaluation Patient Details Name: Natalie Morrison MRN: [id number] DOB: 1971-09-08 Today's Date: 09/24/2015   History of Present Illness  Pt admitted with sepsis  Clinical Impression  Pt pleasant and moving well. Pt demonstrates 5/5 bil LE strength in all myotomes but with gait has wide based gait with impaired balance. Pt will benefit from acute therapy to maximize gait and balance for increased independence and decreased fall risk. Pt educated for HEp with encouragement to continue as well as daily ambulation.     Follow Up Recommendations Outpatient PT    Equipment Recommendations  None recommended by PT    Recommendations for Other Services       Precautions / Restrictions Precautions Precautions: Fall      Mobility  Bed Mobility               General bed mobility comments: standing in room on arrival  Transfers Overall transfer level: Modified independent                  Ambulation/Gait Ambulation/Gait assistance: Supervision Ambulation Distance (Feet): 500 Feet Assistive device: None Gait Pattern/deviations: Wide base of support;Shuffle   Gait velocity interpretation: Below normal speed for age/gender General Gait Details: pt with wide based unsteady gait. Able to maintain balance with shuffling gait and improved stride and stability when holding IV pole, recommend cane use for home  Stairs Stairs: Yes Stairs assistance: Modified independent (Device/Increase time) Stair Management: One rail Right;Step to pattern;Forwards Number of Stairs: 4    Wheelchair Mobility    Modified Rankin (Stroke Patients Only)       Balance Overall balance assessment: Needs assistance   Sitting balance-Leahy Scale: Normal       Standing balance-Leahy Scale: Good                               Pertinent Vitals/Pain Pain Assessment: No/denies pain    Home Living Family/patient expects to be discharged to:: Private  residence Living Arrangements: Spouse/significant other Available Help at Discharge: Available PRN/intermittently Type of Home: House Home Access: Stairs to enter Entrance Stairs-Rails: Right;Left;Can reach both Technical brewer of Steps: 4 Home Layout: One level Home Equipment: None      Prior Function Level of Independence: Independent               Hand Dominance        Extremity/Trunk Assessment   Upper Extremity Assessment: Overall WFL for tasks assessed           Lower Extremity Assessment: Overall WFL for tasks assessed      Cervical / Trunk Assessment: Normal  Communication   Communication: No difficulties  Cognition Arousal/Alertness: Awake/alert Behavior During Therapy: Flat affect Overall Cognitive Status: Within Functional Limits for tasks assessed                      General Comments      Exercises General Exercises - Lower Extremity Long Arc Quad: AROM;Seated;Both;10 reps Hip Flexion/Marching: AROM;Seated;Both;10 reps Toe Raises: AROM;Seated;Both;10 reps      Assessment/Plan    PT Assessment Patient needs continued PT services  PT Diagnosis Abnormality of gait   PT Problem List Decreased activity tolerance;Decreased balance  PT Treatment Interventions Gait training;Functional mobility training;Therapeutic activities;Balance training;Patient/family education   PT Goals (Current goals can be found in the Care Plan section) Acute Rehab PT Goals Patient Stated Goal: return to work PT Goal  Formulation: With patient Time For Goal Achievement: 10/01/15 Potential to Achieve Goals: Good    Frequency Min 3X/week   Barriers to discharge Decreased caregiver support      Co-evaluation               End of Session   Activity Tolerance: Patient tolerated treatment well Patient left: in chair;with call bell/phone within reach Nurse Communication: Mobility status         Time: [id number] PT Time Calculation (min)  (ACUTE ONLY): 16 min   Charges:   PT Evaluation $Initial PT Evaluation Tier I: 1 Procedure     PT G CodesMelford Aase 09/24/2015, 9:28 AM  Elwyn Reach, [place]

## 2015-09-24 NOTE — Telephone Encounter (Signed)
RB saw pt in the hospital on 09/22/15.  Called # provided. VM full and NA to leave [facility]

## 2015-09-25 NOTE — Telephone Encounter (Signed)
Spoke with pt. HFU has been scheduled for 10/18/15 at 9:45am. Nothing further was needed.

## 2015-09-25 NOTE — Telephone Encounter (Signed)
Spoke with patient - aware that she is needing a HFU scheduled for 10-14 days post discharge (between 12/30-1/3) There are no openings with any physician in this time frame. Pt seen by RB in hospital 09/21/15. Please advise Tammy is able to see this patient for HFU the first week of Jan 2017. Thanks.

## 2015-09-25 NOTE — Telephone Encounter (Signed)
Per TP Okay to place on my schedule for HFU the first week of 10/2015

## 2015-09-26 LAB — CULTURE, BLOOD (ROUTINE X 2)
Culture: NO GROWTH
Culture: NO GROWTH

## 2015-10-01 ENCOUNTER — Ambulatory Visit: Payer: BLUE CROSS/BLUE SHIELD | Admitting: Family Medicine

## 2015-10-03 ENCOUNTER — Ambulatory Visit: Payer: BLUE CROSS/BLUE SHIELD | Attending: Internal Medicine | Admitting: Physical Therapy

## 2015-10-03 DIAGNOSIS — R2681 Unsteadiness on feet: Secondary | ICD-10-CM

## 2015-10-03 DIAGNOSIS — R2689 Other abnormalities of gait and mobility: Secondary | ICD-10-CM | POA: Insufficient documentation

## 2015-10-03 DIAGNOSIS — R269 Unspecified abnormalities of gait and mobility: Secondary | ICD-10-CM

## 2015-10-03 NOTE — Therapy (Signed)
[facility] [address] [place], [place], [address] Phone: [phone]   Fax:  [phone]  Physical Therapy Evaluation  Patient Details  Name: Natalie Morrison MRN: [id number] Date of Birth: [birth year] Referring Provider: Tana Coast (Family physician-Burchette)  Encounter Date: 10/03/2015      PT End of Session - 10/03/15 1527    Visit Number 1   Number of Visits 17   Date for PT Re-Evaluation 12/02/15   Authorization Type [employer]   PT Start Time 1320   PT Stop Time 1404   PT Time Calculation (min) 44 min   Equipment Utilized During Treatment Gait belt   Activity Tolerance Patient tolerated treatment well   Behavior During Therapy [facility] for tasks assessed/performed      Past Medical History  Diagnosis Date  . Asthma     Past Surgical History  Procedure Laterality Date  . Breast lumpectomy      There were no vitals filed for this visit.  Visit Diagnosis:  Abnormality of gait  Unsteadiness  Abnormality of gait due to impairment of balance      Subjective Assessment - 10/03/15 1325    Subjective Pt is a 44 year old female who presents to OP PT with recent history of walking problems, sepsis.  Pt was hospitalized for 4 days for sepsis early December 2016.  Pt has cane, which she has been using intermittently, but she does not have at eval.   Pt has had one fall, which was the day prior to admission to hospital.  She reports several bouts of lung infection, asthma issues.     Patient is accompained by: Family member  Husband   Pertinent History Pt c/o dizziness, which she reports as unsteadiness of balance, which started in early November.   Patient Stated Goals Pt's goal for therapy is to use cane better.  Per husband-to get better with strength and endurance.   Currently in Pain? Yes   Pain Score 6    Pain Location Buttocks   Pain Orientation Mid   Pain Descriptors / Indicators --  Bone pain   Pain Type Acute  pain   Pain Onset Yesterday   Pain Frequency Intermittent   Aggravating Factors  sitting on it aggravates   Pain Relieving Factors sititng on pillow alleviates            [facility] PT Assessment - 10/03/15 1331    Assessment   Medical Diagnosis sepsis-gait abnormality   Referring Provider Rai  Family physician-Burchette   Onset Date/Surgical Date 09/21/15   Precautions   Precautions Fall   Balance Screen   Has the patient fallen in the past 6 months Yes   How many times? 1   Has the patient had a decrease in activity level because of a fear of falling?  No   Is the patient reluctant to leave their home because of a fear of falling?  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   Leisure Used exercise bike and lifted weights    Observation/Other Assessments   Focus on Therapeutic Outcomes (FOTO)  Functional Status Intake survey:  61; ABC scale score:  61.9%   ROM / Strength   AROM / PROM / Strength AROM;Strength   AROM   Overall AROM  Within functional limits for tasks performed  lower extremities   Strength   Overall Strength Comments Grossly tested at least 4/5 bilateral lower extremities   Transfers   Transfers Sit to Stand;Stand to Sit   Sit to Stand 6: Modified independent (Device/Increase time);With upper extremity assist;With  armrests;From chair/3-in-1   Stand to Sit 6: Modified independent (Device/Increase time);With upper extremity assist;With armrests;To chair/3-in-1   Ambulation/Gait   Ambulation/Gait Yes   Ambulation/Gait Assistance 5: Supervision   Ambulation/Gait Assistance Details Pt ambulates with wide BOS, with shorter, smaller steps with head turns, head nods, obstacle negotiation, turns with increased number of small steps with decr. foot clearance.   Ambulation Distance (Feet) 20 Feet   Assistive device None   Gait Pattern Decreased arm swing - right;Decreased arm swing - left;Decreased step length - right;Decreased step length - left;Shuffle;Decreased trunk rotation;Wide base of support;Poor foot clearance - left;Poor foot clearance - right   Ambulation Surface Level;Indoor   Gait velocity 14.34   Standardized Balance Assessment   Standardized Balance Assessment Timed Up and Go Test;Dynamic Gait Index   Dynamic Gait Index   Level Surface Moderate Impairment   Change in Gait Speed Moderate Impairment   Gait with Horizontal Head Turns Moderate Impairment   Gait with Vertical Head Turns Moderate Impairment   Gait and Pivot Turn Moderate Impairment   Step Over Obstacle Moderate Impairment   Step Around Obstacles Moderate Impairment   Steps Mild Impairment   Total Score 9   DGI comment: Scores <19/24 indicates increased fall risk.   Timed Up and Go Test   Normal TUG (seconds) 18.08            Self Care:  Discussed components of balance system, including vestibular, somatosensory, and vision.  Discussed results of balance testing, with noted decreased balance and fall risk.  Pt reports desire to return to work, but anxiety being able to walk the distance into the building and balance upon getting there.  Discussed pt's follow up with physician tomorrow and ask him return to work related questions, but did again discuss pt's fall risk and decreased balance with dynamic gait activities.  Per pt  request about return to use of exercise bike at home, PT doe recommend slow return to use of exercise bike, 3-5 minutes 2-3 times per day to start.  If she does walking at home, advised either use of cane or husband supervision.                 PT Short Term Goals - 10/03/15 1537    PT SHORT TERM GOAL #1   Title Pt will be independent with HEP for improved balance, functional strength and gait.  TARGET 11/02/15   Time 4   Period Weeks   Status New   PT SHORT TERM GOAL #2   Title Pt will improve TUG score to less than or equal to 13.5 seconds for decreased fall risk.   Time 4   Period Weeks   Status New   PT SHORT TERM GOAL #  3   Title Pt will improve Dynamic Gait Index score to at least 12/24 for decreased fall risk.   Time 4   Period Weeks   Status New   PT SHORT TERM GOAL #4   Title Pt will ambulate at least 500 ft, indoor/outdoor surfaces, modified independetly, for improved transition to long distance walking to workplace.   Time 4   Period Weeks   Status New   PT SHORT TERM GOAL #5   Title Pt will verbalize understanding of fall prevention in the home environment.   Time 4   Period Weeks   Status New           PT Long Term Goals - 10/03/15 1539    PT LONG TERM GOAL #1   Title Pt will improve ABC scale score by 5 points for improved balance confidence.  TARGET 12/01/15   Time 8   Period Weeks   Status New   PT LONG TERM GOAL #2   Title Pt will improve gait velocity to at least 2.62 ft/sec for improved gait efficiency and safety.   Time 8   Period Weeks   Status New   PT LONG TERM GOAL #3   Title Pt will improve Dynamic Gait Index score to at least 17/24 for decreased fall risk.   Time 8   Period Weeks   Status New   PT LONG TERM GOAL #4   Title Sensory Organization test to be performed-goal to be written as appropriate.   Time 8   Period Weeks   Status New               Plan - 10/03/15 1529    Clinical Impression Statement Pt is a 44  year old female who presents to OP PT status post 4 day hospitalization for septic shock, with gait abnormality.  Pt reports one fall, which was day of ED admission.  Pt presents with decreased balance, abnormality of gait, increased fall risk.  Pt reports decreased balance confidence with ABC scale score of 61.9%,and is at increased fall risk per TUG and Dynamic Gait Index.  Pt is limited with safe long distance gait due to decreased balance, gait velocity.  Pt is concerned about return to work.  Pt will benefit from skilled PT to address the above stated deficits for improved functional mobility, decreased fall risk.   Pt will benefit from skilled therapeutic intervention in order to improve on the following deficits Abnormal gait;Decreased balance;Decreased mobility;Decreased strength;Difficulty walking   Rehab Potential Good   PT Frequency 2x / week   PT Duration 8 weeks  plus eval   PT Treatment/Interventions ADLs/Self Care Home Management;Therapeutic exercise;Therapeutic activities;Functional mobility training;Gait training;DME Instruction;Balance training;Neuromuscular re-education;Patient/family education   PT Next Visit Plan Sensory Organization test (PT to set goal), initiate HEP for corner balance/vestibular exercises   Consulted and Agree with Plan of Care Patient;Family member/caregiver   Family Member Consulted Husband         Problem List Patient Active Problem List   Diagnosis Date Noted  . Hypotension 09/23/2015  . Acute kidney injury ([place]) 09/23/2015  . Septic shock ([place]) 09/21/2015  . History of depression 06/14/2015  . Asthma exacerbation 04/07/2011  . HEMATURIA UNSPECIFIED 07/25/2009  . Alcohol abuse 06/26/2009  . ALLERGIC RHINITIS 06/26/2009  . FATIGUE 06/26/2009  . Essential hypertension 06/28/2007    MARRIOTT,AMY W. 10/03/2015, 3:45 PM [facility] [address] [place],  [place], [address]  Phone: [phone]   Fax:  [phone]  Name: Natalie Morrison MRN: [id number] Date of Birth: 30-Nov-1970

## 2015-10-04 ENCOUNTER — Encounter: Payer: Self-pay | Admitting: Family Medicine

## 2015-10-04 ENCOUNTER — Ambulatory Visit (INDEPENDENT_AMBULATORY_CARE_PROVIDER_SITE_OTHER): Payer: BLUE CROSS/BLUE SHIELD | Admitting: Family Medicine

## 2015-10-04 VITALS — BP 112/80 | HR 140 | Temp 98.7°F | Resp 16 | Ht 65.0 in | Wt 122.5 lb

## 2015-10-04 DIAGNOSIS — D696 Thrombocytopenia, unspecified: Secondary | ICD-10-CM

## 2015-10-04 DIAGNOSIS — N179 Acute kidney failure, unspecified: Secondary | ICD-10-CM

## 2015-10-04 DIAGNOSIS — R7401 Elevation of levels of liver transaminase levels: Secondary | ICD-10-CM

## 2015-10-04 DIAGNOSIS — E876 Hypokalemia: Secondary | ICD-10-CM | POA: Diagnosis not present

## 2015-10-04 DIAGNOSIS — Z8709 Personal history of other diseases of the respiratory system: Secondary | ICD-10-CM

## 2015-10-04 DIAGNOSIS — I959 Hypotension, unspecified: Secondary | ICD-10-CM

## 2015-10-04 DIAGNOSIS — F1010 Alcohol abuse, uncomplicated: Secondary | ICD-10-CM | POA: Diagnosis not present

## 2015-10-04 DIAGNOSIS — R740 Nonspecific elevation of levels of transaminase and lactic acid dehydrogenase [LDH]: Secondary | ICD-10-CM

## 2015-10-04 LAB — COMPREHENSIVE METABOLIC PANEL
ALT: 151 U/L — ABNORMAL HIGH (ref 0–35)
AST: 244 U/L — ABNORMAL HIGH (ref 0–37)
Albumin: 4.1 g/dL (ref 3.5–5.2)
Alkaline Phosphatase: 198 U/L — ABNORMAL HIGH (ref 39–117)
BUN: 9 mg/dL (ref 6–23)
CO2: 18 mEq/L — ABNORMAL LOW (ref 19–32)
Calcium: 9.2 mg/dL (ref 8.4–10.5)
Chloride: 101 mEq/L (ref 96–112)
Creatinine, Ser: 0.64 mg/dL (ref 0.40–1.20)
GFR: 106.32 mL/min (ref >60.00)
Glucose, Bld: 76 mg/dL (ref 70–99)
Potassium: 3.3 mEq/L — ABNORMAL LOW (ref 3.5–5.1)
Sodium: 138 mEq/L (ref 135–145)
Total Bilirubin: 0.4 mg/dL (ref 0.2–1.2)
Total Protein: 6.5 g/dL (ref 6.0–8.3)

## 2015-10-04 LAB — CBC WITH DIFFERENTIAL/PLATELET
Basophils Absolute: 0 10*3/uL (ref 0.0–0.1)
Basophils Relative: 0.4 % (ref 0.0–3.0)
Eosinophils Absolute: 0.2 10*3/uL (ref 0.0–0.7)
Eosinophils Relative: 2.5 % (ref 0.0–5.0)
HCT: 34.4 % — ABNORMAL LOW (ref 36.0–46.0)
Hemoglobin: 11.6 g/dL — ABNORMAL LOW (ref 12.0–15.0)
Lymphocytes Relative: 21.6 % (ref 12.0–46.0)
Lymphs Abs: 2.1 10*3/uL (ref 0.7–4.0)
MCHC: 33.8 g/dL (ref 30.0–36.0)
MCV: 115 fl — ABNORMAL HIGH (ref 78.0–100.0)
Monocytes Absolute: 0.8 10*3/uL (ref 0.1–1.0)
Monocytes Relative: 8.2 % (ref 3.0–12.0)
Neutro Abs: 6.5 10*3/uL (ref 1.4–7.7)
Neutrophils Relative %: 67.3 % (ref 43.0–77.0)
Platelets: 374 10*3/uL (ref 150.0–400.0)
RBC: 2.99 Mil/uL — ABNORMAL LOW (ref 3.87–5.11)
RDW: 15.5 % (ref 11.5–15.5)
WBC: 9.7 10*3/uL (ref 4.0–10.5)

## 2015-10-04 MED ORDER — FLUCONAZOLE 150 MG PO TABS: 150.0000 mg | ORAL_TABLET | Freq: Once | ORAL | Status: DC

## 2015-10-04 NOTE — Progress Notes (Signed)
Pre visit review using our clinic review tool, if applicable. No additional management support is needed unless otherwise documented below in the visit note. 

## 2015-10-04 NOTE — Progress Notes (Signed)
Subjective:    Patient ID: Natalie Morrison, female    DOB: Aug 21, 1971, 44 y.o.   MRN: [id number]  HPI Patient seen for hospital follow-up. She has long-standing history of alcohol abuse, asthma, allergies. She has had prolonged cough for couple of months. Had seen allergist on multiple occasions and in C multiple courses of antibiotics. She was seen in this clinic 5 days prior to admission and received Levaquin and we'll place PPD and have her discontinue lisinopril.  She continued to feel poorly and couple days prior to admission had some nausea, decreased oral intake and progressive weakness. She had near syncopal episode they have admission and when evaluated in ER was found to be hypothermic and hypotensive. Labs showed acute renal failure. Chest x-ray unremarkable.  Patient was admitted for probable septic shock and hypovolemia. Aggressive fluid hydration and renal function improved. Covered with broad-spectrum IV antibiotics. Cultures negative. Echocardiogram normal ejection fraction. No evidence for UTI. Chest x-ray unremarkable.  Acute kidney injury with creatinine 3.12 on admission with potassium 2.8.  ACE inhibitor held at discharge. Patient's potassium improved and she also received magnesium replacement.  Seen in consultation by pulmonary and they may change in her pulmonary medications.  Thrombocytopenia likely related to chronic alcohol abuse. Patient was discharged on multivitamin and thiamine.  She feels better overall but still somewhat weak. She states her appetite is slightly improved. She still had a couple of glasses of wine since discharge. Denies any strange drinking discharge  Past Medical History  Diagnosis Date  . Asthma    Past Surgical History  Procedure Laterality Date  . Breast lumpectomy      reports that she has been smoking Cigarettes.  She has a 12.5 pack-year smoking history. She does not have any smokeless tobacco history on file. She reports that she  drinks about 1.8 oz of alcohol per week. She reports that she does not use illicit drugs. family history is not on file. Allergies  Allergen Reactions  . Nitrofurantoin Hives  . Other Other (See Comments)    Allergies to crab meat, mold, dust per allergy test  . Sulfonamide Derivatives Other (See Comments)    Unknown allergic reaction per husband       Review of Systems  Constitutional: Negative for fever, chills, appetite change and unexpected weight change.  Eyes: Negative for visual disturbance.  Respiratory: Negative for shortness of breath.   Cardiovascular: Negative for chest pain, palpitations and leg swelling.  Gastrointestinal: Negative for nausea, vomiting and abdominal pain.  Genitourinary: Negative for dysuria.  Neurological: Negative for dizziness and syncope.  Psychiatric/Behavioral: Negative for confusion.       Objective:   Physical Exam  Constitutional: She is oriented to person, place, and time. She appears well-developed and well-nourished.  HENT:  Right Ear: External ear normal.  Left Ear: External ear normal.  Mouth/Throat: Oropharynx is clear and moist.  Neck: Neck supple. No thyromegaly present.  Cardiovascular: Normal rate and regular rhythm.   Pulmonary/Chest: Effort normal and breath sounds normal. No respiratory distress. She has no wheezes. She has no rales.  Abdominal: Soft. She exhibits no mass. There is no tenderness. There is no rebound and no guarding.  Musculoskeletal: She exhibits no edema.  Lymphadenopathy:    She has no cervical adenopathy.  Neurological: She is alert and oriented to person, place, and time. No cranial nerve deficit.  Psychiatric: She has a normal mood and affect. Her behavior is normal. Judgment and thought content normal.  Assessment & Plan:  #1 alcohol abuse. She has a long-standing history alcohol abuse and questionable motivation to quit. We had a long talk with her and her husband regarding the  seriousness of this. We have strongly advised outpatient support program which she will consider.  #2 recent hypotension. Question of septic shock. Patient currently stable and has finished outpatient course with Augmentin. She did not have any clear infectious source and cultures and chest x-ray unremarkable.  #3 acute renal failure -likely prerenal. She also taken some nonsteroidals. She had been on benazepril but not taken this for several days prior to admission. Repeat basic metabolic panel. Continue to hold ACE inhibitor  #4 recent hypokalemia likely related to poor intake. Recheck basic metabolic panel. Increase potassium rich foods  #5 history of asthma. Ongoing nicotine use. Continue current inhalers as per pulmonary  #6 thrombocytopenia very likely related to her chronic alcohol abuse. Recheck CBC

## 2015-10-04 NOTE — Patient Instructions (Addendum)
Potassium Content of Foods Potassium is a mineral found in many foods and drinks. It helps keep fluids and minerals balanced in your body and affects how steadily your heart beats. Potassium also helps control your blood pressure and keep your muscles and nervous system healthy. Certain health conditions and medicines may change the balance of potassium in your body. When this happens, you can help balance your level of potassium through the foods that you do or do not eat. Your health care provider or dietitian may recommend an amount of potassium that you should have each day. The following lists of foods provide the amount of potassium (in parentheses) per serving in each item. HIGH IN POTASSIUM  The following foods and beverages have 200 mg or more of potassium per serving:  Apricots, 2 raw or 5 dry (200 mg).  Artichoke, 1 medium (345 mg).  Avocado, raw,  each (245 mg).  Banana, 1 medium (425 mg).  Beans, lima, or baked beans, canned,  cup (280 mg).  Beans, white, canned,  cup (595 mg).  Beef roast, 3 oz (320 mg).  Beef, ground, 3 oz (270 mg).  Beets, raw or cooked,  cup (260 mg).  Bran muffin, 2 oz (300 mg).  Broccoli,  cup (230 mg).  Brussels sprouts,  cup (250 mg).  Cantaloupe,  cup (215 mg).  Cereal, 100% bran,  cup (200-400 mg).  Cheeseburger, single, fast food, 1 each (225-400 mg).  Chicken, 3 oz (220 mg).  Clams, canned, 3 oz (535 mg).  Crab, 3 oz (225 mg).  Dates, 5 each (270 mg).  Dried beans and peas,  cup (300-475 mg).  Figs, dried, 2 each (260 mg).  Fish: halibut, tuna, cod, snapper, 3 oz (480 mg).  Fish: salmon, haddock, swordfish, perch, 3 oz (300 mg).  Fish, tuna, canned 3 oz (200 mg).  French fries, fast food, 3 oz (470 mg).  Granola with fruit and nuts,  cup (200 mg).  Grapefruit juice,  cup (200 mg).  Greens, beet,  cup (655 mg).  Honeydew melon,  cup (200 mg).  Kale, raw, 1 cup (300 mg).  Kiwi, 1 medium (240  mg).  Kohlrabi, rutabaga, parsnips,  cup (280 mg).  Lentils,  cup (365 mg).  Mango, 1 each (325 mg).  Milk, chocolate, 1 cup (420 mg).  Milk: nonfat, low-fat, whole, buttermilk, 1 cup (350-380 mg).  Molasses, 1 Tbsp (295 mg).  Mushrooms,  cup (280) mg.  Nectarine, 1 each (275 mg).  Nuts: almonds, peanuts, hazelnuts, [place], cashew, mixed, 1 oz (200 mg).  Nuts, pistachios, 1 oz (295 mg).  Orange, 1 each (240 mg).  Orange juice,  cup (235 mg).  Papaya, medium,  fruit (390 mg).  Peanut butter, chunky, 2 Tbsp (240 mg).  Peanut butter, smooth, 2 Tbsp (210 mg).  Pear, 1 medium (200 mg).  Pomegranate, 1 whole (400 mg).  Pomegranate juice,  cup (215 mg).  Pork, 3 oz (350 mg).  Potato chips, salted, 1 oz (465 mg).  Potato, baked with skin, 1 medium (925 mg).  Potatoes, boiled,  cup (255 mg).  Potatoes, mashed,  cup (330 mg).  Prune juice,  cup (370 mg).  Prunes, 5 each (305 mg).  Pudding, chocolate,  cup (230 mg).  Pumpkin, canned,  cup (250 mg).  Raisins, seedless,  cup (270 mg).  Seeds, sunflower or pumpkin, 1 oz (240 mg).  Soy milk, 1 cup (300 mg).  Spinach,  cup (420 mg).  Spinach, canned,  cup (370 mg).    Sweet potato, baked with skin, 1 medium (450 mg).  Swiss chard,  cup (480 mg).  Tomato or vegetable juice,  cup (275 mg).  Tomato sauce or puree,  cup (400-550 mg).  Tomato, raw, 1 medium (290 mg).  Tomatoes, canned,  cup (200-300 mg).  Turkey, 3 oz (250 mg).  Wheat germ, 1 oz (250 mg).  Winter squash,  cup (250 mg).  Yogurt, plain or fruited, 6 oz (260-435 mg).  Zucchini,  cup (220 mg). MODERATE IN POTASSIUM The following foods and beverages have 50-200 mg of potassium per serving:  Apple, 1 each (150 mg).  Apple juice,  cup (150 mg).  Applesauce,  cup (90 mg).  Apricot nectar,  cup (140 mg).  Asparagus, small spears,  cup or 6 spears (155 mg).  Bagel, cinnamon raisin, 1 each (130 mg).  Bagel,  egg or plain, 4 in., 1 each (70 mg).  Beans, green,  cup (90 mg).  Beans, yellow,  cup (190 mg).  Beer, regular, 12 oz (100 mg).  Beets, canned,  cup (125 mg).  Blackberries,  cup (115 mg).  Blueberries,  cup (60 mg).  Bread, whole wheat, 1 slice (70 mg).  Broccoli, raw,  cup (145 mg).  Cabbage,  cup (150 mg).  Carrots, cooked or raw,  cup (180 mg).  Cauliflower, raw,  cup (150 mg).  Celery, raw,  cup (155 mg).  Cereal, bran flakes, cup (120-150 mg).  Cheese, cottage,  cup (110 mg).  Cherries, 10 each (150 mg).  Chocolate, 1 oz bar (165 mg).  Coffee, brewed 6 oz (90 mg).  Corn,  cup or 1 ear (195 mg).  Cucumbers,  cup (80 mg).  Egg, large, 1 each (60 mg).  Eggplant,  cup (60 mg).  Endive, raw, cup (80 mg).  English muffin, 1 each (65 mg).  Fish, orange roughy, 3 oz (150 mg).  Frankfurter, beef or pork, 1 each (75 mg).  Fruit cocktail,  cup (115 mg).  Grape juice,  cup (170 mg).  Grapefruit,  fruit (175 mg).  Grapes,  cup (155 mg).  Greens: kale, turnip, collard,  cup (110-150 mg).  Ice cream or frozen yogurt, chocolate,  cup (175 mg).  Ice cream or frozen yogurt, vanilla,  cup (120-150 mg).  Lemons, limes, 1 each (80 mg).  Lettuce, all types, 1 cup (100 mg).  Mixed vegetables,  cup (150 mg).  Mushrooms, raw,  cup (110 mg).  Nuts: walnuts, pecans, or macadamia, 1 oz (125 mg).  Oatmeal,  cup (80 mg).  Okra,  cup (110 mg).  Onions, raw,  cup (120 mg).  Peach, 1 each (185 mg).  Peaches, canned,  cup (120 mg).  Pears, canned,  cup (120 mg).  Peas, green, frozen,  cup (90 mg).  Peppers, green,  cup (130 mg).  Peppers, red,  cup (160 mg).  Pineapple juice,  cup (165 mg).  Pineapple, fresh or canned,  cup (100 mg).  Plums, 1 each (105 mg).  Pudding, vanilla,  cup (150 mg).  Raspberries,  cup (90 mg).  Rhubarb,  cup (115 mg).  Rice, wild,  cup (80 mg).  Shrimp, 3 oz (155  mg).  Spinach, raw, 1 cup (170 mg).  Strawberries,  cup (125 mg).  Summer squash  cup (175-200 mg).  Swiss chard, raw, 1 cup (135 mg).  Tangerines, 1 each (140 mg).  Tea, brewed, 6 oz (65 mg).  Turnips,  cup (140 mg).  Watermelon,  cup (85 mg).  Wine, red, table,   5 oz (180 mg).  Wine, white, table, 5 oz (100 mg). LOW IN POTASSIUM The following foods and beverages have less than 50 mg of potassium per serving.  Bread, white, 1 slice (30 mg).  Carbonated beverages, 12 oz (less than 5 mg).  Cheese, 1 oz (20-30 mg).  Cranberries,  cup (45 mg).  Cranberry juice cocktail,  cup (20 mg).  Fats and oils, 1 Tbsp (less than 5 mg).  Hummus, 1 Tbsp (32 mg).  Nectar: papaya, mango, or pear,  cup (35 mg).  Rice, white or brown,  cup (50 mg).  Spaghetti or macaroni,  cup cooked (30 mg).  Tortilla, flour or corn, 1 each (50 mg).  Waffle, 4 in., 1 each (50 mg).  Water chestnuts,  cup (40 mg).   This information is not intended to replace advice given to you by your health care provider. Make sure you discuss any questions you have with your health care provider.   Document Released: 05/05/2005 Document Revised: 09/26/2013 Document Reviewed: 08/18/2013 Elsevier Interactive Patient Education 2016 [employer]. Alcohol Abuse and Nutrition Alcohol abuse is any pattern of alcohol consumption that harms your health, relationships, or work. Alcohol abuse can affect how your body breaks down and absorbs nutrients from food by causing your liver to work abnormally. Additionally, many people who abuse alcohol do not eat enough carbohydrates, protein, fat, vitamins, and minerals. This can cause poor nutrition (malnutrition) and a lack of nutrients (nutrient deficiencies), which can lead to further complications. Nutrients that are commonly lacking (deficient) among people who abuse alcohol include:  Vitamins.  Vitamin A. This is stored in your liver. It is important for your  vision, metabolism, and ability to fight off infections (immunity).  B vitamins. These include vitamins such as folate, thiamin, and niacin. These are important in new cell growth and maintenance.  Vitamin C. This plays an important role in iron absorption, wound healing, and immunity.  Vitamin D. This is produced by your liver, but you can also get vitamin D from food. Vitamin D is necessary for your body to absorb and use calcium.  Minerals.  Calcium. This is important for your bones and your heart and blood vessel (cardiovascular) function.  Iron. This is important for blood, muscle, and nervous system functioning.  Magnesium. This plays an important role in muscle and nerve function, and it helps to control blood sugar and blood pressure.  Zinc. This is important for the normal function of your nervous system and digestive system (gastrointestinal tract). Nutrition is an essential component of therapy for alcohol abuse. Your health care provider or dietitian will work with you to design a plan that can help restore nutrients to your body and prevent potential complications. WHAT IS MY PLAN? Your dietitian may develop a specific diet plan that is based on your condition and any other complications you may have. A diet plan will commonly include:  A balanced diet.  Grains: 6-8 oz per day.  Vegetables: 2-3 cups per day.  Fruits: 1-2 cups per day.  Meat and other protein: 5-6 oz per day.  Dairy: 2-3 cups per day.  Vitamin and mineral supplements. WHAT DO I NEED TO KNOW ABOUT ALCOHOL AND NUTRITION?  Consume foods that are high in antioxidants, such as grapes, berries, nuts, green tea, and dark green and orange vegetables. This can help to counteract some of the stress that is placed on your liver by consuming alcohol.  Avoid food and drinks that are high in fat and  sugar. Foods such as sugared soft drinks, salty snack foods, and candy contain empty calories. This means that they  lack important nutrients such as protein, fiber, and vitamins.  Eat frequent meals and snacks. Try to eat 5-6 small meals each day.  Eat a variety of fresh fruits and vegetables each day. This will help you get plenty of water, fiber, and vitamins in your diet.  Drink plenty of water and other clear fluids. Try to drink at least 48-64 oz (1.5-2 L) of water per day.  If you are a vegetarian, eat a variety of protein-rich foods. Pair whole grains with plant-based proteins at meals and snacks to obtain the greatest nutrient benefit from your food. For example, eat rice with beans, put peanut butter on whole-grain toast, or eat oatmeal with sunflower seeds.  Soak beans and whole grains overnight before cooking. This can help your body to absorb the nutrients more easily.  Include foods fortified with vitamins and minerals in your diet. Commonly fortified foods include milk, orange juice, cereal, and bread.  If you are malnourished, your dietitian may recommend a high-protein, high-calorie diet. This may include:  2,000-3,000 calories (kilocalories) per day.  70-100 grams of protein per day.  Your health care provider may recommend a complete nutritional supplement beverage. This can help to restore calories, protein, and vitamins to your body. Depending on your condition, you may be advised to consume this instead of or in addition to meals.  Limit your intake of caffeine. Replace drinks like coffee and black tea with decaffeinated coffee and herbal tea.  Eat a variety of foods that are high in omega fatty acids. These include fish, nuts and seeds, and soybeans. These foods may help your liver to recover and may also stabilize your mood.  Certain medicines may cause changes in your appetite, taste, and weight. Work with your health care provider and dietitian to make any adjustments to your medicines and diet plan.  Include other healthy lifestyle choices in your daily routine.  Be  physically active.  Get enough sleep.  Spend time doing activities that you enjoy.  If you are unable to take in enough food and calories by mouth, your health care provider may recommend a feeding tube. This is a tube that passes through your nose and throat, directly into your stomach. Nutritional supplement beverages can be given to you through the feeding tube to help you get the nutrients you need.  Take vitamin or mineral supplements as recommended by your health care provider. WHAT FOODS CAN I EAT? Grains Enriched pasta. Enriched rice. Fortified whole-grain bread. Fortified whole-grain cereal. Barley. Brown rice. Quinoa. [place]. Vegetables All fresh, frozen, and canned vegetables. Spinach. Kale. Artichoke. Carrots. Winter squash and pumpkin. Sweet potatoes. Broccoli. Cabbage. Cucumbers. Tomatoes. Sweet peppers. Green beans. Peas. Corn. Fruits All fresh and frozen fruits. Berries. Grapes. Mango. Papaya. Guava. Cherries. Apples. Bananas. Peaches. Plums. Pineapple. Watermelon. Cantaloupe. Oranges. Avocado. Meats and Other Protein Sources Beef liver. Lean beef. Pork. Fresh and canned chicken. Fresh fish. Oysters. Sardines. Canned tuna. Shrimp. Eggs with yolks. Nuts and seeds. Peanut butter. Beans and lentils. Soybeans. Tofu. Dairy Whole, low-fat, and nonfat milk. Whole, low-fat, and nonfat yogurt. Cottage cheese. Sour cream. Hard and soft cheeses. Beverages Water. Herbal tea. Decaffeinated coffee. Decaffeinated green tea. 100% fruit juice. 100% vegetable juice. Instant breakfast shakes. Condiments Ketchup. Mayonnaise. Mustard. Salad dressing. Barbecue sauce. Sweets and Desserts Sugar-free ice cream. Sugar-free pudding. Sugar-free gelatin. Fats and Oils Butter. Vegetable oil, flaxseed oil, olive  oil, and walnut oil. Other Complete nutrition shakes. Protein bars. Sugar-free gum. The items listed above may not be a complete list of recommended foods or beverages. Contact your dietitian  for more options. WHAT FOODS ARE NOT RECOMMENDED? Grains Sugar-sweetened breakfast cereals. Flavored instant oatmeal. Fried breads. Vegetables Breaded or deep-fried vegetables. Fruits Dried fruit with added sugar. Candied fruit. Canned fruit in syrup. Meats and Other Protein Sources Breaded or deep-fried meats. Dairy Flavored milks. Fried cheese curds or fried cheese sticks. Beverages Alcohol. Sugar-sweetened soft drinks. Sugar-sweetened tea. Caffeinated coffee and tea. Condiments Sugar. Honey. Agave nectar. Molasses. Sweets and Desserts Chocolate. Cake. Cookies. Candy. Other Potato chips. Pretzels. Salted nuts. Candied nuts. The items listed above may not be a complete list of foods and beverages to avoid. Contact your dietitian for more information.   This information is not intended to replace advice given to you by your health care provider. Make sure you discuss any questions you have with your health care provider.   Document Released: 07/16/2005 Document Revised: 10/12/2014 Document Reviewed: 04/24/2014 Elsevier Interactive Patient Education [employer].

## 2015-10-08 ENCOUNTER — Ambulatory Visit: Payer: BLUE CROSS/BLUE SHIELD | Attending: Internal Medicine | Admitting: Physical Therapy

## 2015-10-08 DIAGNOSIS — R29898 Other symptoms and signs involving the musculoskeletal system: Secondary | ICD-10-CM | POA: Insufficient documentation

## 2015-10-08 DIAGNOSIS — R269 Unspecified abnormalities of gait and mobility: Secondary | ICD-10-CM | POA: Diagnosis not present

## 2015-10-08 DIAGNOSIS — R2681 Unsteadiness on feet: Secondary | ICD-10-CM | POA: Insufficient documentation

## 2015-10-08 NOTE — Therapy (Signed)
[facility] [address] [place], [place], [address] Phone: [phone]   Fax:  [phone]  Physical Therapy Treatment  Patient Details  Name: Natalie Morrison MRN: [id number] Date of Birth: 10-05-1971 Referring Provider: Tana Coast (Family physician-[facility])  Encounter Date: 10/08/2015      PT End of Session - 10/08/15 1250    Visit Number 2   Number of Visits 17   Date for PT Re-Evaluation 12/02/15   Authorization Type [employer]   PT Start Time [phone]  pt arrived 11 minutes late for appointment   PT Stop Time 0851   PT Time Calculation (min) 40 min   Equipment Utilized During Treatment Gait belt   Activity Tolerance Patient tolerated treatment well   Behavior During Therapy [facility] for tasks assessed/performed      Past Medical History  Diagnosis Date  . Asthma     Past Surgical History  Procedure Laterality Date  . Breast lumpectomy      There were no vitals filed for this visit.  Visit Diagnosis:  Abnormality of gait      Subjective Assessment - 10/08/15 0814    Subjective Pt was thinking about doing some yoga and asking about a gym membership.  Is now on disability (per MD order) so not sure when returning to work.  Has not been using the cane much.   Patient Stated Goals Pt's goal for therapy is to use cane better.  Per husband-to get better with strength and endurance.   Currently in Pain? No/denies     Neuro Reed-standing at counter for SLS x 10 seconds x 2, tandem stance x 10 seconds x 2, tandem walking x 8'x6-all with intermittent UE support. Corner balance activities with eyes open and eyes closed with normal and narrow BOS, on compliant and non-compliant surface.  Increased difficulty with compliant surface and decreased BOS.  Provided as HEP.  Scifit level 2.0 x 11 minutes for strengthening and endurance.  Discussed not returning to gym until endurance improves with therapy sessions.  Discussed increasing  time on exercise bike at home to 10 minutes.          PT Education - 10/08/15 1249    Education provided Yes   Education Details HEP, waiting on gym membership and yoga until endurance increases with therapy, increasing time on home exercise bike to 10 minutes.   Person(s) Educated Patient   Methods Explanation;Demonstration;Handout   Comprehension Verbalized understanding          PT Short Term Goals - 10/03/15 1537    PT SHORT TERM GOAL #1   Title Pt will be independent with HEP for improved balance, functional strength and gait.  TARGET 11/02/15   Time 4   Period Weeks   Status New   PT SHORT TERM GOAL #2   Title Pt will improve TUG score to less than or equal to 13.5 seconds for decreased fall risk.   Time 4   Period Weeks   Status New   PT SHORT TERM GOAL #3   Title Pt will improve Dynamic Gait Index score to at least 12/24 for decreased fall risk.   Time 4   Period Weeks   Status New   PT SHORT TERM GOAL #4   Title Pt will ambulate at least 500 ft, indoor/outdoor surfaces, modified independetly, for improved transition to long distance walking to workplace.   Time 4   Period Weeks   Status New   PT SHORT TERM  GOAL #5   Title Pt will verbalize understanding of fall prevention in the home environment.   Time 4   Period Weeks   Status New           PT Long Term Goals - 10/03/15 1539    PT LONG TERM GOAL #1   Title Pt will improve ABC scale score by 5 points for improved balance confidence.  TARGET 12/01/15   Time 8   Period Weeks   Status New   PT LONG TERM GOAL #2   Title Pt will improve gait velocity to at least 2.62 ft/sec for improved gait efficiency and safety.   Time 8   Period Weeks   Status New   PT LONG TERM GOAL #3   Title Pt will improve Dynamic Gait Index score to at least 17/24 for decreased fall risk.   Time 8   Period Weeks   Status New   PT LONG TERM GOAL #4   Title Sensory Organization test to be performed-goal to be written as  appropriate.   Time 8   Period Weeks   Status New               Plan - 10/08/15 1251    Clinical Impression Statement Pt continues with gait with wide BOS and fearful of falling.  Decreased stability on compliant surface.  Unable to check SOT today secondary to another therapist using machine.  Continue PT per POC.   Pt will benefit from skilled therapeutic intervention in order to improve on the following deficits Abnormal gait;Decreased balance;Decreased mobility;Decreased strength;Difficulty walking   Rehab Potential Good   PT Frequency 2x / week   PT Duration 8 weeks  plus eval   PT Treatment/Interventions ADLs/Self Care Home Management;Therapeutic exercise;Therapeutic activities;Functional mobility training;Gait training;DME Instruction;Balance training;Neuromuscular re-education;Patient/family education   PT Next Visit Plan Sensory Organization test (PT to set goal), review HEP for corner balance and revise as needed, add strengthening program   Consulted and Agree with Plan of Care Patient        Problem List Patient Active Problem List   Diagnosis Date Noted  . Hypotension 09/23/2015  . Acute kidney injury ([place]) 09/23/2015  . Septic shock ([place]) 09/21/2015  . History of depression 06/14/2015  . Asthma exacerbation 04/07/2011  . HEMATURIA UNSPECIFIED 07/25/2009  . Alcohol abuse 06/26/2009  . ALLERGIC RHINITIS 06/26/2009  . FATIGUE 06/26/2009  . Essential hypertension 06/28/2007    Narda Bonds 10/08/2015, 12:54 PM  [facility] [address] [place], [place], [address] Phone: [phone]   Fax:  [phone]  Name: Natalie Morrison MRN: [id number] Date of Birth: May 04, 1971    Narda Bonds, [place] 10/08/2015 12:54 PM Phone: [phone] Fax: [phone]

## 2015-10-08 NOTE — Patient Instructions (Signed)
Do in a corner with chair in front of you for balance    Feet Apart, Head Motion - Eyes Open    With eyes open, feet apart, move head slowly: up and down. Repeat 10 times per session. Do 3 sessions per day.  Copyright  VHI. All rights reserved.   Feet Apart (Compliant Surface) Head Motion - Eyes Open    With eyes open, standing on compliant surface: pillow, feet shoulder width apart, move head slowly: up and down. Repeat 10 times per session. Repeat with head turns side to side.  Do 3 sessions per day.  Copyright  VHI. All rights reserved.   Feet Together, Head Motion - Eyes Open    With eyes open, feet together, move head slowly: up and down.   Repeat 10 times per session.   Repeat with head turns side to side.  Do 3 sessions per day.  Copyright  VHI. All rights reserved.    Feet Together (Compliant Surface) Head Motion - Eyes Closed    Stand on compliant surface: pillow with feet together. Close eyes and move head slowly, up and down. Repeat 10 times per session. Repeat side to side.  Do 3 sessions per day.  Copyright  VHI. All rights reserved.

## 2015-10-09 ENCOUNTER — Ambulatory Visit: Payer: BLUE CROSS/BLUE SHIELD | Admitting: Physical Therapy

## 2015-10-10 ENCOUNTER — Ambulatory Visit: Payer: BLUE CROSS/BLUE SHIELD | Admitting: Physical Therapy

## 2015-10-10 ENCOUNTER — Encounter: Payer: Self-pay | Admitting: Physical Therapy

## 2015-10-10 DIAGNOSIS — R269 Unspecified abnormalities of gait and mobility: Secondary | ICD-10-CM | POA: Diagnosis not present

## 2015-10-10 DIAGNOSIS — R2681 Unsteadiness on feet: Secondary | ICD-10-CM

## 2015-10-10 DIAGNOSIS — R29898 Other symptoms and signs involving the musculoskeletal system: Secondary | ICD-10-CM

## 2015-10-10 NOTE — Therapy (Signed)
[facility] [address] [place], [place], [address] Phone: [phone]   Fax:  [phone]  Physical Therapy Treatment  Patient Details  Name: Natalie Morrison MRN: [id number] Date of Birth: 01-12-1971 Referring Provider: Tana Coast (Family physician-Burchette)  Encounter Date: 10/10/2015    Past Medical History  Diagnosis Date  . Asthma     Past Surgical History  Procedure Laterality Date  . Breast lumpectomy      There were no vitals filed for this visit.  Visit Diagnosis:  Abnormality of gait  Unsteadiness  Leg weakness, bilateral      Subjective Assessment - 10/13/15 1612    Subjective Pt states main problems are decreased strength in legs and decr. balance; states she plans on going back to work next month - is taking Jan. to work on recovering and getting stronger    Patient Stated Goals Pt's goal for therapy is to use cane better.  Per husband-to get better with strength and endurance.   Currently in Pain? No/denies     SOT completed - score 72/100 which is WNL's Decr. Somatosensory at 79/100 with N= 90/100  Normal visual and vestibular inputs per SOT despite difficulty pt had completing test - legs were tremoring with pt reporting anxiety during testing  Falls occurred on rep #2 of condition 5 and on rep #1 condition 6  Pt performed heel raises x 10 reps; squats on AirEx x 10 reps with UE support prn inside bars  SLS 10 secs each leg; alternate tap ups to 6" step with UE support prn x 10 reps each leg  TherEx: Leg press 40# 2 sets 10 reps - bil. LE's; see heel raise and squat exercise listed above  Pt reported fatigue at end of session                  [facility] Adult PT Treatment/Exercise - 10/13/15 0001    Knee/Hip Exercises: Aerobic   Stationary Bike SciFit level 1.5 x 5" with UE's and LE's   Knee/Hip Exercises: Machines for Strengthening   Cybex Leg Press bil. 40# 3 sets 10 reps   Knee/Hip Exercises: Standing   Heel Raises Both;1 set;10 reps   Forward Step Up Both;1 set;10 reps                  PT Short Term Goals - 10/03/15 1537    PT SHORT TERM GOAL #1   Title Pt will be independent with HEP for improved balance, functional strength and gait.  TARGET 11/02/15   Time 4   Period Weeks   Status New   PT SHORT TERM GOAL #2   Title Pt will improve TUG score to less than or equal to 13.5 seconds for decreased fall risk.   Time 4   Period Weeks   Status New   PT SHORT TERM GOAL #3   Title Pt will improve Dynamic Gait Index score to at least 12/24 for decreased fall risk.   Time 4   Period Weeks   Status New   PT SHORT TERM GOAL #4   Title Pt will ambulate at least 500 ft, indoor/outdoor surfaces, modified independetly, for improved transition to long distance walking to workplace.   Time 4   Period Weeks   Status New   PT SHORT TERM GOAL #5   Title Pt will verbalize understanding of fall prevention in the home environment.   Time 4   Period Weeks   Status New  PT Long Term Goals - 10/10/15 1648    PT LONG TERM GOAL #1   Title Pt will improve ABC scale score by 5 points for improved balance confidence.  TARGET 12/01/15   PT LONG TERM GOAL #2   Title Pt will improve gait velocity to at least 2.62 ft/sec for improved gait efficiency and safety.   PT LONG TERM GOAL #3   Title Pt will improve Dynamic Gait Index score to at least 17/24 for decreased fall risk.   PT LONG TERM GOAL #4   Title Sensory Organization test to be performed-goal to be written as appropriate.   Baseline completed test on 10-10-15 -- score 72/100 (WNL's) despite difficulty completing test - LTG written   PT LONG TERM GOAL #5   Title Pt will have normal scores on all trials of conditions 5 and 6 on SOT to demo improved balance/vestibular function.  (established by SD)   Time 8   Period Weeks   Status New               Problem List Patient Active  Problem List   Diagnosis Date Noted  . Hypotension 09/23/2015  . Acute kidney injury ([facility]) 09/23/2015  . Septic shock ([facility] [place]) 09/21/2015  . History of depression 06/14/2015  . Asthma exacerbation 04/07/2011  . HEMATURIA UNSPECIFIED 07/25/2009  . Alcohol abuse 06/26/2009  . ALLERGIC RHINITIS 06/26/2009  . FATIGUE 06/26/2009  . Essential hypertension 06/28/2007    Alda Lea, PT 10/13/2015, 4:18 PM  [facility] [address] [place], [place], [address] Phone: [phone]   Fax:  [phone]  Name: AASHA QUILLEN MRN: [id number] Date of Birth: Nov 14, 1970

## 2015-10-10 NOTE — Patient Instructions (Signed)
SINGLE LIMB STANCE    Stance: single leg on floor. Raise leg. Hold _10__ seconds. Repeat with other leg. _2__ reps per set, __2-3 sets per day, _5Heel Raises    Stand with support. Tighten pelvic floor and hold. With knees straight, raise heels off ground. Hold _3_ seconds. Relax for 2-3__ seconds. Repeat _10__ times. Do _3_ times a day.  Copyright  VHI. All rights reserved.   DO SQUATS - 10 reps  Anterior Step-Up    Stand with _6_ inch step placed in A direction. Step onto step with right foot without pushing off with the ground foot. Finish with ground foot in touch balance on step and return __10_ times. __1-2_ sets _2__ times per day.  http://gglj.exer.us/161   Copyright  VHI. All rights reserved.

## 2015-10-13 ENCOUNTER — Encounter: Payer: Self-pay | Admitting: Physical Therapy

## 2015-10-15 ENCOUNTER — Encounter: Payer: Self-pay | Admitting: Physical Therapy

## 2015-10-15 ENCOUNTER — Ambulatory Visit: Payer: BLUE CROSS/BLUE SHIELD | Admitting: Physical Therapy

## 2015-10-15 DIAGNOSIS — R29898 Other symptoms and signs involving the musculoskeletal system: Secondary | ICD-10-CM

## 2015-10-15 DIAGNOSIS — R269 Unspecified abnormalities of gait and mobility: Secondary | ICD-10-CM | POA: Diagnosis not present

## 2015-10-15 NOTE — Therapy (Signed)
[facility] [address] [place], [place], [address] Phone: [phone]   Fax:  [phone]  Physical Therapy Treatment  Patient Details  Name: Natalie Morrison MRN: [id number] Date of Birth: January 27, 1971 Referring Provider: Tana Coast (Family physician-Burchette)  Encounter Date: 10/15/2015      PT End of Session - 10/15/15 1915    Visit Number 4   Number of Visits 17   Date for PT Re-Evaluation 12/02/15   Authorization Type BCBS   PT Start Time [id number]   PT Stop Time 1533   PT Time Calculation (min) 42 min      Past Medical History  Diagnosis Date  . Asthma     Past Surgical History  Procedure Laterality Date  . Breast lumpectomy      There were no vitals filed for this visit.  Visit Diagnosis:  Abnormality of gait  Leg weakness, bilateral      Subjective Assessment - 10/15/15 1904    Subjective Pt states she is doing exercises at home - she is now up to 21" on her stationary bike; feels balance is improving   Patient Stated Goals Pt's goal for therapy is to use cane better.  Per husband-to get better with strength and endurance.   Currently in Pain? No/denies                         [facility] Adult PT Treatment/Exercise - 10/15/15 1510    Transfers   Transfers Sit to Stand   Sit to Stand 5: Supervision  on foam   Knee/Hip Exercises: Machines for Strengthening   Cybex Leg Press bil. LE's 45# 2 sets 10 reps; 40# 10 reps   Knee/Hip Exercises: Standing   Heel Raises Both;1 set;10 reps   Forward Step Up Both;1 set;10 reps;Step Height: 6"   Functional Squat 1 set;10 reps  on BOSU inside bars - with UE support   Knee/Hip Exercises: Seated   Hamstring Curl Strengthening;Both;1 set;10 reps  with green theraband     NeuroRe-ed; weight shifts anterior/posteriorly on BOSU; sit to stand on foam without UE support for improved standing balance                       Tandem walking inside bars without  UE support - with SBA 10' x 4 reps   TherEx:   LAQ bil. LE's with green theraband x 10 reps each 3 sec hold; standing bil. Hip flexion, extension, abduction with green theraband                  X 10 reps each          PT Education - 10/15/15 1914    Education provided Yes   Education Details pt was given green theraband for hip flexion, abduction and extension and for UE strengthening (shoulder flexion)   Person(s) Educated Patient   Methods Explanation;Demonstration   Comprehension Verbalized understanding;Returned demonstration          PT Short Term Goals - 10/03/15 1537    PT SHORT TERM GOAL #1   Title Pt will be independent with HEP for improved balance, functional strength and gait.  TARGET 11/02/15   Time 4   Period Weeks   Status New   PT SHORT TERM GOAL #2   Title Pt will improve TUG score to less than or equal to 13.5 seconds for decreased fall risk.   Time 4  Period Weeks   Status New   PT SHORT TERM GOAL #3   Title Pt will improve Dynamic Gait Index score to at least 12/24 for decreased fall risk.   Time 4   Period Weeks   Status New   PT SHORT TERM GOAL #4   Title Pt will ambulate at least 500 ft, indoor/outdoor surfaces, modified independetly, for improved transition to long distance walking to workplace.   Time 4   Period Weeks   Status New   PT SHORT TERM GOAL #5   Title Pt will verbalize understanding of fall prevention in the home environment.   Time 4   Period Weeks   Status New           PT Long Term Goals - 10/10/15 1648    PT LONG TERM GOAL #1   Title Pt will improve ABC scale score by 5 points for improved balance confidence.  TARGET 12/01/15   PT LONG TERM GOAL #2   Title Pt will improve gait velocity to at least 2.62 ft/sec for improved gait efficiency and safety.   PT LONG TERM GOAL #3   Title Pt will improve Dynamic Gait Index score to at least 17/24 for decreased fall risk.   PT LONG TERM GOAL #4   Title Sensory Organization  test to be performed-goal to be written as appropriate.   Baseline completed test on 10-10-15 -- score 72/100 (WNL's) despite difficulty completing test - LTG written   PT LONG TERM GOAL #5   Title Pt will have normal scores on all trials of conditions 5 and 6 on SOT to demo improved balance/vestibular function.  (established by SD)   Time 8   Period Weeks   Status New               Plan - 10/15/15 1915    Clinical Impression Statement Pt improving with balance and LE strength; continues to have tremors in LE's during challenging activities, single limb stance, etc.   Pt will benefit from skilled therapeutic intervention in order to improve on the following deficits Abnormal gait;Decreased balance;Decreased mobility;Decreased strength;Difficulty walking   Rehab Potential Good   PT Frequency 2x / week   PT Duration 8 weeks   PT Treatment/Interventions ADLs/Self Care Home Management;Therapeutic exercise;Therapeutic activities;Functional mobility training;Gait training;DME Instruction;Balance training;Neuromuscular re-education;Patient/family education   PT Next Visit Plan give pics for hip abduction, flexion, extension with use of theraband - cont LE strengthening and balance   PT Home Exercise Plan added heel raises, squats, SLS, and step ups to HEP   Consulted and Agree with Plan of Care Patient        Problem List Patient Active Problem List   Diagnosis Date Noted  . Hypotension 09/23/2015  . Acute kidney injury ([facility]) 09/23/2015  . Septic shock ([facility]) 09/21/2015  . History of depression 06/14/2015  . Asthma exacerbation 04/07/2011  . HEMATURIA UNSPECIFIED 07/25/2009  . Alcohol abuse 06/26/2009  . ALLERGIC RHINITIS 06/26/2009  . FATIGUE 06/26/2009  . Essential hypertension 06/28/2007    Alda Lea, PT 10/15/2015, 7:20 PM  [facility] [address] [place], [place], [address] Phone: [phone]    Fax:  [phone]  Name: Natalie Morrison MRN: [id number] Date of Birth: 10-20-1970

## 2015-10-16 ENCOUNTER — Ambulatory Visit: Payer: BLUE CROSS/BLUE SHIELD | Admitting: Physical Therapy

## 2015-10-16 DIAGNOSIS — R269 Unspecified abnormalities of gait and mobility: Secondary | ICD-10-CM | POA: Diagnosis not present

## 2015-10-16 DIAGNOSIS — R29898 Other symptoms and signs involving the musculoskeletal system: Secondary | ICD-10-CM

## 2015-10-16 NOTE — Patient Instructions (Signed)
EXTENSION: Standing - Resistance Band (Active)    Stand, both feet flat. Against green resistance band, draw right leg behind body as far as possible. Complete 2 sets of 10 repetitions. Perform 1 sessions per day.  http://gtsc.exer.us/83   Copyright  VHI. All rights reserved.   FLEXION: Standing - Resistance Band (Active)    Stand, both feet flat. Against green resistance band, lift right knee toward ceiling. Complete 2 sets of 10 repetitions. Perform 1 sessions per day.  Copyright  VHI. All rights reserved.   ABDUCTION: Standing - Resistance Band (Active)    Stand, feet flat. Against yellow resistance band, lift right leg out to side. Complete 2 sets of 10 repetitions. Perform 1 sessions per day.  http://gtsc.exer.us/117   Copyright  VHI. All rights reserved.

## 2015-10-17 NOTE — Therapy (Signed)
[facility] [address] [place], [place], [address] Phone: [phone]   Fax:  [phone]  Physical Therapy Treatment  Patient Details  Name: Natalie Morrison MRN: [id number] Date of Birth: [birth year] Referring Provider: Tana Coast (Family physician-Burchette)  Encounter Date: 10/16/2015      PT End of Session - 10/16/15 1312    Visit Number 5   Number of Visits 17   Date for PT Re-Evaluation 12/02/15   Authorization Type BCBS   PT Start Time 1105   PT Stop Time 1146   PT Time Calculation (min) 41 min   Equipment Utilized During Treatment Gait belt   Activity Tolerance Patient tolerated treatment well   Behavior During Therapy [facility] for tasks assessed/performed      Past Medical History  Diagnosis Date  . Asthma     Past Surgical History  Procedure Laterality Date  . Breast lumpectomy      There were no vitals filed for this visit.  Visit Diagnosis:  Abnormality of gait  Leg weakness, bilateral      Subjective Assessment - 10/16/15 1112    Subjective "She killed me yesterday." after PT.  Reports being sore but feels like balance is getting better.   Pertinent History Pt c/o dizziness, which she reports as unsteadiness of balance, which started in early November.   Patient Stated Goals Pt's goal for therapy is to use cane better.  Per husband-to get better with strength and endurance.   Currently in Pain? No/denies       Gait 220' x 2 and 120'x 2 working on armswing, decreased BOS and cadence.  Pt tends to have very wide BOS and keep UE's extended as if ready to catch herself.  Gait improves with cues and no LOB noted.  Bil LE press x 45# x 3 sets of 10 reps and 50# of 10 reps         Balance Exercises - 10/17/15 0918    Balance Exercises: Standing   Rockerboard Anterior/posterior;Lateral;Head turns;EO;10 reps;UE support   Tandem Gait Forward;Retro;Upper extremity support   Sidestepping  Foam/compliant support;Upper extremity support;5 reps   Other Standing Exercises Standing on foam using zoom ball x 2 minutes and foam with ball toss/catch x 20 reps, standing on black platform of BOSU with intermittent UE support           PT Education - 10/16/15 1311    Education provided Yes   Education Details HEP   Person(s) Educated Patient   Methods Explanation;Handout   Comprehension Verbalized understanding          PT Short Term Goals - 10/03/15 1537    PT SHORT TERM GOAL #1   Title Pt will be independent with HEP for improved balance, functional strength and gait.  TARGET 11/02/15   Time 4   Period Weeks   Status New   PT SHORT TERM GOAL #2   Title Pt will improve TUG score to less than or equal to 13.5 seconds for decreased fall risk.   Time 4   Period Weeks   Status New   PT SHORT TERM GOAL #3   Title Pt will improve Dynamic Gait Index score to at least 12/24 for decreased fall risk.   Time 4   Period Weeks   Status New   PT SHORT TERM GOAL #4   Title Pt will ambulate at least 500 ft, indoor/outdoor surfaces, modified independetly, for improved transition to long distance walking to workplace.  Time 4   Period Weeks   Status New   PT SHORT TERM GOAL #5   Title Pt will verbalize understanding of fall prevention in the home environment.   Time 4   Period Weeks   Status New           PT Long Term Goals - 10/10/15 1648    PT LONG TERM GOAL #1   Title Pt will improve ABC scale score by 5 points for improved balance confidence.  TARGET 12/01/15   PT LONG TERM GOAL #2   Title Pt will improve gait velocity to at least 2.62 ft/sec for improved gait efficiency and safety.   PT LONG TERM GOAL #3   Title Pt will improve Dynamic Gait Index score to at least 17/24 for decreased fall risk.   PT LONG TERM GOAL #4   Title Sensory Organization test to be performed-goal to be written as appropriate.   Baseline completed test on 10-10-15 -- score 72/100 (WNL's)  despite difficulty completing test - LTG written   PT LONG TERM GOAL #5   Title Pt will have normal scores on all trials of conditions 5 and 6 on SOT to demo improved balance/vestibular function.  (established by SD)   Time 8   Period Weeks   Status New               Plan - 10/17/15 0915    Clinical Impression Statement Continues with improvement in balance and strength yet continues with very guarded gait and wide BOS.  Continue PT per POC.   Pt will benefit from skilled therapeutic intervention in order to improve on the following deficits Abnormal gait;Decreased balance;Decreased mobility;Decreased strength;Difficulty walking   Rehab Potential Good   PT Frequency 2x / week   PT Duration 8 weeks   PT Treatment/Interventions ADLs/Self Care Home Management;Therapeutic exercise;Therapeutic activities;Functional mobility training;Gait training;DME Instruction;Balance training;Neuromuscular re-education;Patient/family education   PT Next Visit Plan cont LE strengthening and balance   PT Home Exercise Plan added heel raises, squats, SLS, and step ups to HEP   Consulted and Agree with Plan of Care Patient        Problem List Patient Active Problem List   Diagnosis Date Noted  . Hypotension 09/23/2015  . Acute kidney injury ([place]) 09/23/2015  . Septic shock ([place]) 09/21/2015  . History of depression 06/14/2015  . Asthma exacerbation 04/07/2011  . HEMATURIA UNSPECIFIED 07/25/2009  . Alcohol abuse 06/26/2009  . ALLERGIC RHINITIS 06/26/2009  . FATIGUE 06/26/2009  . Essential hypertension 06/28/2007    Narda Bonds 10/17/2015, 9:20 AM  [facility] [address] [place], [place], [address] Phone: [phone]   Fax:  [phone]  Name: Natalie Morrison MRN: [id number] Date of Birth: 1970-11-14    Narda Bonds, [place] 10/17/2015 9:20 AM Phone:  [phone] Fax: [phone]

## 2015-10-18 ENCOUNTER — Encounter: Payer: Self-pay | Admitting: Adult Health

## 2015-10-18 ENCOUNTER — Ambulatory Visit (INDEPENDENT_AMBULATORY_CARE_PROVIDER_SITE_OTHER): Payer: BLUE CROSS/BLUE SHIELD | Admitting: Adult Health

## 2015-10-18 VITALS — BP 110/80 | HR 86 | Temp 97.8°F | Ht 65.0 in | Wt 129.0 lb

## 2015-10-18 DIAGNOSIS — J45901 Unspecified asthma with (acute) exacerbation: Secondary | ICD-10-CM | POA: Diagnosis not present

## 2015-10-18 DIAGNOSIS — A419 Sepsis, unspecified organism: Secondary | ICD-10-CM | POA: Diagnosis not present

## 2015-10-18 DIAGNOSIS — R6521 Severe sepsis with septic shock: Secondary | ICD-10-CM | POA: Diagnosis not present

## 2015-10-18 NOTE — Assessment & Plan Note (Signed)
Resolved  Advised on good hydration and food intake  Cont to monitor

## 2015-10-18 NOTE — Addendum Note (Signed)
Addended by: Osa Craver on: 10/18/2015 10:54 AM   Modules accepted: Orders

## 2015-10-18 NOTE — Assessment & Plan Note (Signed)
Resolved, suspect with heavy hx of smoking may have component of COPD  For now stay on nebs and return for PFT .  May benefit from inhaler as she returns to work .   Plan  Continue on current regimen  Keep working on quitting smoking  Keep dental check up.  Work on cutting back on alcohol use.  follow up Dr. Lamonte Sakai  In 6 weeks with PFT  Please contact office for sooner follow up if symptoms do not improve or worsen or seek emergency care

## 2015-10-18 NOTE — Progress Notes (Signed)
Subjective:    Patient ID: Natalie Morrison, female    DOB: [birth year], 45 y.o.   MRN: [id number]  HPI 45 yo female smoker with asthma and etoh abuse seen for PCCM consult for hypovolemic shock  And acute renal failure   10/18/2015 [facility] follow up  Patient presents for a post hospital follow-up. She was admitted December 17 through 09/24/2015 for hypovolemic shock, acute renal failure possibly secondary to dehydration w/ poor intake from possible viral syndrome +/- etoh abuse .  She improved with fluid resusciatation and initial pressor support. She was treated with empiric abx , stress dose steroids for possible sepsis component however no infection identified (cxr neg , Ua neg). Pan cx neg.  Scr cr improved prior to discharge back to nml .  She is a smoker , smoked since age 74, with 1.5 PPD. Has cut back to 1/2 PPD since discharge .  Has cut back on etoh to 3 drinks daily . Discussed smoking and etoh cessation .  Has seen PCP with recs to seek OP tx program.  She is feeling better, feels she is getting stronger. Trying to exercise. Going to PT .  She is on Brovana and Pulmicort neb Twice daily  .    Past Medical History  Diagnosis Date  . Asthma    Current Outpatient Prescriptions on File Prior to Visit  Medication Sig Dispense Refill  . albuterol (PROVENTIL,VENTOLIN) 90 MCG/ACT inhaler Inhale 2 puffs into the lungs every 6 (six) hours as needed for wheezing or shortness of breath. Per Dr Velora Heckler, rescue inhaler    . ALPRAZolam (XANAX) 0.25 MG tablet Take 1 tablet (0.25 mg total) by mouth daily as needed for anxiety. 30 tablet 0  . arformoterol (BROVANA) 15 MCG/2ML NEBU Take 2 mLs (15 mcg total) by nebulization 2 (two) times daily. 120 mL 6  . budesonide (PULMICORT) 0.25 MG/2ML nebulizer solution Take 2 mLs (0.25 mg total) by nebulization 2 (two) times daily. 60 mL 12  . cetirizine (ZYRTEC) 10 MG chewable tablet Chew 10 mg by mouth daily.      . Cyanocobalamin (VITAMIN B-12  PO) Take 1 tablet by mouth daily.    Marland Kitchen GLUCOSAMINE-CHONDROITIN PO Take 1 tablet by mouth daily.    . Multiple Vitamin (MULTIVITAMIN WITH MINERALS) TABS tablet Take 1 tablet by mouth daily. One a Day Women's    . Multiple Vitamins-Minerals (AIRBORNE PO) Take 1 tablet by mouth daily.    . promethazine (PHENERGAN) 12.5 MG tablet Take 1 tablet (12.5 mg total) by mouth every 6 (six) hours as needed for nausea or vomiting. 30 tablet 0  . thiamine 100 MG tablet Take 1 tablet (100 mg total) by mouth daily. 30 tablet 3  . potassium chloride SA (K-DUR,KLOR-CON) 20 MEQ tablet Take 1 tablet (20 mEq total) by mouth daily. (Patient not taking: Reported on 10/18/2015) 7 tablet 0   No current facility-administered medications on file prior to visit.    Review of Systems  Constitutional:   No  weight loss, night sweats,  Fevers, chills, fatigue, or  lassitude.  HEENT:   No headaches,  Difficulty swallowing,  Tooth/dental problems, or  Sore throat,                No sneezing, itching, ear ache, nasal congestion, post nasal drip,   CV:  No chest pain,  Orthopnea, PND, swelling in lower extremities, anasarca, dizziness, palpitations, syncope.   GI  No heartburn, indigestion, abdominal pain, nausea, vomiting, diarrhea,  change in bowel habits, loss of appetite, bloody stools.   Resp:  .  No chest wall deformity  Skin: no rash or lesions.  GU: no dysuria, change in color of urine, no urgency or frequency.  No flank pain, no hematuria   MS:  No joint pain or swelling.  No decreased range of motion.  No back pain.  Psych:  No change in mood or affect. No depression or anxiety.  No memory loss.         Objective:   Physical Exam Filed Vitals:   10/18/15 1002  BP: 110/80  Pulse: 86  Temp: 97.8 F (36.6 C)  TempSrc: Oral  Height: 5\' 5"  (1.651 m)  Weight: 129 lb (58.514 kg)  SpO2: 95%   Body mass index is 21.47 kg/(m^2).  GEN: A/Ox3; pleasant , NAD, thin anxious   HEENT:  [place]/AT,  EACs-clear,  TMs-wnl, NOSE-clear, THROAT-clear, no lesions, no postnasal drip or exudate noted. Poor dentition   NECK:  Supple w/ fair ROM; no JVD; normal carotid impulses w/o bruits; no thyromegaly or nodules palpated; no lymphadenopathy.  RESP  Clear  P & A; w/o, wheezes/ rales/ or rhonchi.no accessory muscle use, no dullness to percussion  CARD:  RRR, no m/r/g  , no peripheral edema, pulses intact, no cyanosis or clubbing.  GI:   Soft & nt; nml bowel sounds; no organomegaly or masses detected.  Musco: Warm bil, no deformities or joint swelling noted.   Neuro: alert, no focal deficits noted.    Skin: Warm, no lesions or rashes         Assessment & Plan:

## 2015-10-18 NOTE — Patient Instructions (Signed)
Continue on current regimen  Keep working on quitting smoking  Keep dental check up.  Work on cutting back on alcohol use.  follow up Dr. Lamonte Sakai  In 6 weeks with PFT  Please contact office for sooner follow up if symptoms do not improve or worsen or seek emergency care

## 2015-10-22 ENCOUNTER — Other Ambulatory Visit (INDEPENDENT_AMBULATORY_CARE_PROVIDER_SITE_OTHER): Payer: BLUE CROSS/BLUE SHIELD

## 2015-10-22 ENCOUNTER — Other Ambulatory Visit: Payer: BLUE CROSS/BLUE SHIELD

## 2015-10-22 ENCOUNTER — Ambulatory Visit: Payer: BLUE CROSS/BLUE SHIELD | Admitting: Physical Therapy

## 2015-10-22 DIAGNOSIS — R269 Unspecified abnormalities of gait and mobility: Secondary | ICD-10-CM

## 2015-10-22 DIAGNOSIS — I10 Essential (primary) hypertension: Secondary | ICD-10-CM | POA: Diagnosis not present

## 2015-10-22 LAB — BASIC METABOLIC PANEL
BUN: 8 mg/dL (ref 6–23)
CO2: 23 mEq/L (ref 19–32)
Calcium: 9.4 mg/dL (ref 8.4–10.5)
Chloride: 106 mEq/L (ref 96–112)
Creatinine, Ser: 0.71 mg/dL (ref 0.40–1.20)
GFR: 94.3 mL/min (ref >60.00)
Glucose, Bld: 92 mg/dL (ref 70–99)
Potassium: 3.4 mEq/L — ABNORMAL LOW (ref 3.5–5.1)
Sodium: 144 mEq/L (ref 135–145)

## 2015-10-22 LAB — VITAMIN B12: Vitamin B-12: 1171 pg/mL — ABNORMAL HIGH (ref 211–911)

## 2015-10-22 LAB — TSH: TSH: 2.73 u[IU]/mL (ref 0.35–4.50)

## 2015-10-22 NOTE — Therapy (Signed)
[facility] [address] [place], [place], [address] Phone: [phone]   Fax:  [phone]  Physical Therapy Treatment  Patient Details  Name: Natalie Morrison MRN: [id number] Date of Birth: 1971/07/19 Referring Provider: Tana Coast (Family physician-[place])  Encounter Date: 10/22/2015      PT End of Session - 10/22/15 1525    Visit Number 6   Number of Visits 17   Date for PT Re-Evaluation 12/02/15   Authorization Type [employer]   PT Start Time 1319   PT Stop Time 1400   PT Time Calculation (min) 41 min   Equipment Utilized During Treatment Gait belt   Activity Tolerance Patient tolerated treatment well   Behavior During Therapy [facility] for tasks assessed/performed      Past Medical History  Diagnosis Date  . Asthma     Past Surgical History  Procedure Laterality Date  . Breast lumpectomy      There were no vitals filed for this visit.  Visit Diagnosis:  Abnormality of gait      Subjective Assessment - 10/22/15 1321    Subjective Denies falls or changes.  Feels like she is improving.  Still has difficulty with standing on pillow eyes closed.   Pertinent History Pt c/o dizziness, which she reports as unsteadiness of balance, which started in early November.   Patient Stated Goals Pt's goal for therapy is to use cane better.  Per husband-to get better with strength and endurance.   Currently in Pain? No/denies             [facility] Adult PT Treatment/Exercise - 10/22/15 1323    Transfers   Transfers Sit to Stand   Sit to Stand 6: Modified independent (Device/Increase time)   Stand to Sit 6: Modified independent (Device/Increase time);With upper extremity assist;With armrests;To chair/3-in-1   Ambulation/Gait   Ambulation/Gait Yes   Ambulation/Gait Assistance 5: Supervision   Ambulation Distance (Feet) 1000 Feet  plus treadmill for 9 minutes   Assistive device None   Gait Pattern Decreased arm swing -  right;Decreased arm swing - left;Decreased step length - right;Decreased step length - left;Shuffle;Decreased trunk rotation;Wide base of support;Poor foot clearance - left;Poor foot clearance - right   Ambulation Surface Level;Indoor   Gait Comments on treadmill with gait trainer and without working on increasing mph and step length.  1.9 mph maximum.  Pt needing bil UE support and constant cues for step length and heel strike.  Also worked on decreasing speed and kicking blue therapy ball for 1 minute.   Knee/Hip Exercises: Aerobic   Elliptical x 2 minutes level 1.0 with min guard assist   Knee/Hip Exercises: Machines for Strengthening   Cybex Leg Press bil LE's #50 x 10 reps x 3 sets                PT Education - 10/22/15 1524    Education provided Yes   Education Details treadmill gait training to increase step length and cadence, arm swing with gait and decreasing wide BOS   Person(s) Educated Patient   Methods Explanation;Demonstration;Tactile cues;Verbal cues   Comprehension Verbalized understanding          PT Short Term Goals - 10/03/15 1537    PT SHORT TERM GOAL #1   Title Pt will be independent with HEP for improved balance, functional strength and gait.  TARGET 11/02/15   Time 4   Period Weeks   Status New   PT SHORT TERM GOAL #2  Title Pt will improve TUG score to less than or equal to 13.5 seconds for decreased fall risk.   Time 4   Period Weeks   Status New   PT SHORT TERM GOAL #3   Title Pt will improve Dynamic Gait Index score to at least 12/24 for decreased fall risk.   Time 4   Period Weeks   Status New   PT SHORT TERM GOAL #4   Title Pt will ambulate at least 500 ft, indoor/outdoor surfaces, modified independetly, for improved transition to long distance walking to workplace.   Time 4   Period Weeks   Status New   PT SHORT TERM GOAL #5   Title Pt will verbalize understanding of fall prevention in the home environment.   Time 4   Period Weeks    Status New           PT Long Term Goals - 10/10/15 1648    PT LONG TERM GOAL #1   Title Pt will improve ABC scale score by 5 points for improved balance confidence.  TARGET 12/01/15   PT LONG TERM GOAL #2   Title Pt will improve gait velocity to at least 2.62 ft/sec for improved gait efficiency and safety.   PT LONG TERM GOAL #3   Title Pt will improve Dynamic Gait Index score to at least 17/24 for decreased fall risk.   PT LONG TERM GOAL #4   Title Sensory Organization test to be performed-goal to be written as appropriate.   Baseline completed test on 10-10-15 -- score 72/100 (WNL's) despite difficulty completing test - LTG written   PT LONG TERM GOAL #5   Title Pt will have normal scores on all trials of conditions 5 and 6 on SOT to demo improved balance/vestibular function.  (established by SD)   Time 8   Period Weeks   Status New               Plan - 10/22/15 1525    Clinical Impression Statement Gait improved after end of session focusing on increasing cadence and decreasing wide BOS.  Pt needs rest breaks during sessions but works well during session.  Continue PT per POC.   Pt will benefit from skilled therapeutic intervention in order to improve on the following deficits Abnormal gait;Decreased balance;Decreased mobility;Decreased strength;Difficulty walking   Rehab Potential Good   PT Frequency 2x / week   PT Duration 8 weeks   PT Treatment/Interventions ADLs/Self Care Home Management;Therapeutic exercise;Therapeutic activities;Functional mobility training;Gait training;DME Instruction;Balance training;Neuromuscular re-education;Patient/family education   PT Next Visit Plan cont LE strengthening and balance   PT Home Exercise Plan added heel raises, squats, SLS, and step ups to HEP   Consulted and Agree with Plan of Care Patient        Problem List Patient Active Problem List   Diagnosis Date Noted  . Hypotension 09/23/2015  . Acute kidney injury ([place])  09/23/2015  . Septic shock ([place]) 09/21/2015  . History of depression 06/14/2015  . Asthma exacerbation 04/07/2011  . HEMATURIA UNSPECIFIED 07/25/2009  . Alcohol abuse 06/26/2009  . ALLERGIC RHINITIS 06/26/2009  . FATIGUE 06/26/2009  . Essential hypertension 06/28/2007    Narda Bonds 10/22/2015, 3:27 PM  [facility] [address] [place], [place], [address] Phone: [phone]   Fax:  [phone]  Name: Natalie Morrison MRN: [id number] Date of Birth: 29-Nov-1970    Narda Bonds, [place] 10/22/2015 3:27 PM  Phone: [phone] Fax: [phone]

## 2015-10-23 ENCOUNTER — Telehealth: Payer: Self-pay | Admitting: Family Medicine

## 2015-10-23 NOTE — Telephone Encounter (Signed)
Patient returned Autumn's telephone call.  Please call her at [phone]

## 2015-10-23 NOTE — Telephone Encounter (Signed)
Pt is aware of lab results.

## 2015-10-24 ENCOUNTER — Ambulatory Visit: Payer: BLUE CROSS/BLUE SHIELD | Admitting: Physical Therapy

## 2015-10-28 ENCOUNTER — Emergency Department (HOSPITAL_COMMUNITY): Payer: BLUE CROSS/BLUE SHIELD

## 2015-10-28 ENCOUNTER — Ambulatory Visit: Payer: BLUE CROSS/BLUE SHIELD | Admitting: Physical Therapy

## 2015-10-28 ENCOUNTER — Emergency Department (HOSPITAL_COMMUNITY)
Admission: EM | Admit: 2015-10-28 | Discharge: 2015-10-28 | Disposition: A | Discharge status: Home / Self Care | Payer: BLUE CROSS/BLUE SHIELD | Attending: Emergency Medicine | Admitting: Emergency Medicine

## 2015-10-28 ENCOUNTER — Encounter (HOSPITAL_COMMUNITY): Payer: Self-pay

## 2015-10-28 DIAGNOSIS — K2970 Gastritis, unspecified, without bleeding: Secondary | ICD-10-CM | POA: Diagnosis not present

## 2015-10-28 DIAGNOSIS — J45909 Unspecified asthma, uncomplicated: Secondary | ICD-10-CM | POA: Insufficient documentation

## 2015-10-28 DIAGNOSIS — Z79899 Other long term (current) drug therapy: Secondary | ICD-10-CM | POA: Insufficient documentation

## 2015-10-28 DIAGNOSIS — F17210 Nicotine dependence, cigarettes, uncomplicated: Secondary | ICD-10-CM | POA: Insufficient documentation

## 2015-10-28 DIAGNOSIS — F1020 Alcohol dependence, uncomplicated: Secondary | ICD-10-CM | POA: Diagnosis not present

## 2015-10-28 DIAGNOSIS — R1084 Generalized abdominal pain: Secondary | ICD-10-CM

## 2015-10-28 DIAGNOSIS — Z7951 Long term (current) use of inhaled steroids: Secondary | ICD-10-CM | POA: Insufficient documentation

## 2015-10-28 DIAGNOSIS — R1110 Vomiting, unspecified: Secondary | ICD-10-CM | POA: Diagnosis present

## 2015-10-28 LAB — COMPREHENSIVE METABOLIC PANEL
ALT: 102 U/L — ABNORMAL HIGH (ref 14–54)
AST: 192 U/L — ABNORMAL HIGH (ref 15–41)
Albumin: 4 g/dL (ref 3.5–5.0)
Alkaline Phosphatase: 76 U/L (ref 38–126)
Anion gap: 21 — ABNORMAL HIGH (ref 5–15)
BUN: <5 mg/dL — ABNORMAL LOW (ref 6–20)
CO2: 19 mmol/L — ABNORMAL LOW (ref 22–32)
Calcium: 8.9 mg/dL (ref 8.9–10.3)
Chloride: 100 mmol/L — ABNORMAL LOW (ref 101–111)
Creatinine, Ser: 0.59 mg/dL (ref 0.44–1.00)
GFR calc Af Amer: >60 mL/min (ref >60)
GFR calc non Af Amer: >60 mL/min (ref >60)
Glucose, Bld: 72 mg/dL (ref 65–99)
Potassium: 3.3 mmol/L — ABNORMAL LOW (ref 3.5–5.1)
Sodium: 140 mmol/L (ref 135–145)
Total Bilirubin: 0.8 mg/dL (ref 0.3–1.2)
Total Protein: 6.3 g/dL — ABNORMAL LOW (ref 6.5–8.1)

## 2015-10-28 LAB — CBC WITH DIFFERENTIAL/PLATELET
Basophils Absolute: 0 10*3/uL (ref 0.0–0.1)
Basophils Relative: 0 %
Eosinophils Absolute: 0.2 10*3/uL (ref 0.0–0.7)
Eosinophils Relative: 3 %
HCT: 35.6 % — ABNORMAL LOW (ref 36.0–46.0)
Hemoglobin: 12.6 g/dL (ref 12.0–15.0)
Lymphocytes Relative: 48 %
Lymphs Abs: 3.4 10*3/uL (ref 0.7–4.0)
MCH: 39.4 pg — ABNORMAL HIGH (ref 26.0–34.0)
MCHC: 35.4 g/dL (ref 30.0–36.0)
MCV: 111.3 fL — ABNORMAL HIGH (ref 78.0–100.0)
Monocytes Absolute: 0.8 10*3/uL (ref 0.1–1.0)
Monocytes Relative: 11 %
Neutro Abs: 2.7 10*3/uL (ref 1.7–7.7)
Neutrophils Relative %: 38 %
Platelets: 171 10*3/uL (ref 150–400)
RBC: 3.2 MIL/uL — ABNORMAL LOW (ref 3.87–5.11)
RDW: 13.9 % (ref 11.5–15.5)
WBC: 7.1 10*3/uL (ref 4.0–10.5)

## 2015-10-28 LAB — I-STAT CG4 LACTIC ACID, ED
Lactic Acid, Venous: 1.69 mmol/L (ref 0.5–2.0)
Lactic Acid, Venous: 1.92 mmol/L (ref 0.5–2.0)

## 2015-10-28 LAB — URINALYSIS, ROUTINE W REFLEX MICROSCOPIC
Bilirubin Urine: NEGATIVE
Glucose, UA: NEGATIVE mg/dL
Hgb urine dipstick: NEGATIVE
Ketones, ur: 40 mg/dL — AB
Leukocytes, UA: NEGATIVE
Nitrite: NEGATIVE
Protein, ur: 30 mg/dL — AB
Specific Gravity, Urine: 1.012 (ref 1.005–1.030)
pH: 6 (ref 5.0–8.0)

## 2015-10-28 LAB — URINE MICROSCOPIC-ADD ON: RBC / HPF: NONE SEEN RBC/hpf (ref 0–5)

## 2015-10-28 MED ORDER — OMEPRAZOLE 20 MG PO CPDR: 20.0000 mg | DELAYED_RELEASE_CAPSULE | Freq: Two times a day (BID) | ORAL | Status: DC

## 2015-10-28 MED ORDER — PANTOPRAZOLE SODIUM 40 MG IV SOLR
40.0000 mg | Freq: Once | INTRAVENOUS | Status: AC
  Administered 2015-10-28: 40 mg via INTRAVENOUS
  Filled 2015-10-28: qty 40

## 2015-10-28 MED ORDER — SODIUM CHLORIDE 0.9 % IV BOLUS (SEPSIS)
1000.0000 mL | Freq: Once | INTRAVENOUS | Status: AC
  Administered 2015-10-28: 1000 mL via INTRAVENOUS

## 2015-10-28 MED ORDER — ONDANSETRON HCL 4 MG/2ML IJ SOLN
4.0000 mg | Freq: Once | INTRAMUSCULAR | Status: AC
  Administered 2015-10-28: 4 mg via INTRAVENOUS
  Filled 2015-10-28: qty 2

## 2015-10-28 NOTE — ED Provider Notes (Signed)
CSN: [id number]     Arrival date & time 10/28/15  1305 History   First MD Initiated Contact with Patient 10/28/15 1804     Chief Complaint  Patient presents with  . Emesis  . Weakness      HPI  Emergency valuation of weakness and poor appetite. Admitted with an episode of sepsis in December. No etiology found for her sepsis. Has history of alcoholism. His heavy smoker. Is down to half pack procedures per day and she states "3 whiskeys". Vomiting last night. Nauseated today. Presents here. Has not been febrile at home. Abdominal pain. No diarrhea. Last bowel movement 2 days ago.  Past Medical History  Diagnosis Date  . Asthma    Past Surgical History  Procedure Laterality Date  . Breast lumpectomy     History reviewed. No pertinent family history. Social History  Substance Use Topics  . Smoking status: Current Every Day Smoker -- 0.50 packs/day for 25 years    Types: Cigarettes  . Smokeless tobacco: None  . Alcohol Use: 1.8 oz/week    3 Shots of liquor per week   OB History    No data available     Review of Systems  Constitutional: Negative for fever, chills, diaphoresis, appetite change and fatigue.  HENT: Negative for mouth sores, sore throat and trouble swallowing.   Eyes: Negative for visual disturbance.  Respiratory: Negative for cough, chest tightness, shortness of breath and wheezing.   Cardiovascular: Negative for chest pain.  Gastrointestinal: Positive for nausea and vomiting. Negative for abdominal pain, diarrhea and abdominal distention.  Endocrine: Negative for polydipsia, polyphagia and polyuria.  Genitourinary: Negative for dysuria, frequency and hematuria.  Musculoskeletal: Negative for gait problem.  Skin: Negative for color change, pallor and rash.  Neurological: Positive for weakness. Negative for dizziness, syncope, light-headedness and headaches.  Hematological: Does not bruise/bleed easily.  Psychiatric/Behavioral: Negative for behavioral problems  and confusion.      Allergies  Nitrofurantoin; Other; and Sulfonamide derivatives  Home Medications   Prior to Admission medications   Medication Sig Start Date End Date Taking? Authorizing Provider  albuterol (PROVENTIL,VENTOLIN) 90 MCG/ACT inhaler Inhale 2 puffs into the lungs every 6 (six) hours as needed for wheezing or shortness of breath. Per Dr Velora Heckler, rescue inhaler   Yes Historical Provider, MD  arformoterol (BROVANA) 15 MCG/2ML NEBU Take 2 mLs (15 mcg total) by nebulization 2 (two) times daily. 09/24/15  Yes Ripudeep K Rai, MD  budesonide (PULMICORT) 0.25 MG/2ML nebulizer solution Take 2 mLs (0.25 mg total) by nebulization 2 (two) times daily. 09/24/15  Yes Ripudeep Krystal Eaton, MD  cetirizine (ZYRTEC) 10 MG chewable tablet Chew 10 mg by mouth at bedtime.    Yes Historical Provider, MD  Cyanocobalamin (VITAMIN B-12 PO) Take 1 tablet by mouth daily.   Yes Historical Provider, MD  fluticasone (FLONASE) 50 MCG/ACT nasal spray Place 1 spray into both nostrils daily.   Yes Historical Provider, MD  GLUCOSAMINE-CHONDROITIN PO Take 1 tablet by mouth daily.   Yes Historical Provider, MD  Multiple Vitamin (MULTIVITAMIN WITH MINERALS) TABS tablet Take 1 tablet by mouth daily. One a Day Women's   Yes Historical Provider, MD  promethazine (PHENERGAN) 12.5 MG tablet Take 1 tablet (12.5 mg total) by mouth every 6 (six) hours as needed for nausea or vomiting. 09/24/15  Yes Ripudeep Krystal Eaton, MD  thiamine 100 MG tablet Take 1 tablet (100 mg total) by mouth daily. 09/24/15  Yes Ripudeep Krystal Eaton, MD  ALPRAZolam Duanne Moron) 0.25 MG  tablet Take 1 tablet (0.25 mg total) by mouth daily as needed for anxiety. Patient not taking: Reported on 10/28/2015 09/24/15   Ripudeep Krystal Eaton, MD  omeprazole (PRILOSEC) 20 MG capsule Take 1 capsule (20 mg total) by mouth 2 (two) times daily. 10/28/15   Tanna Furry, MD  potassium chloride SA (K-DUR,KLOR-CON) 20 MEQ tablet Take 1 tablet (20 mEq total) by mouth daily. Patient not taking:  Reported on 10/18/2015 09/24/15   Ripudeep K Rai, MD   BP 117/79 mmHg  Pulse 93  Temp(Src) 97.8 F (36.6 C) (Oral)  Resp 16  Ht 5\' 6"  (1.676 m)  Wt 125 lb (56.7 kg)  BMI 20.19 kg/m2  SpO2 96% Physical Exam  Constitutional: She is oriented to person, place, and time. She appears well-developed and well-nourished. No distress.  Awake and alert. Oriented. Is not jaundiced. No tremor. Oriented lucid.  HENT:  Head: Normocephalic.  Eyes: Conjunctivae are normal. Pupils are equal, round, and reactive to light. No scleral icterus.  Neck: Normal range of motion. Neck supple. No thyromegaly present.  Cardiovascular: Normal rate and regular rhythm.  Exam reveals no gallop and no friction rub.   No murmur heard. Pulmonary/Chest: Effort normal and breath sounds normal. No respiratory distress. She has no wheezes. She has no rales.  Abdominal: Soft. Bowel sounds are normal. She exhibits no distension. There is no tenderness. There is no rebound.    Musculoskeletal: Normal range of motion.  Neurological: She is alert and oriented to person, place, and time.  Skin: Skin is warm and dry. No rash noted.  Psychiatric: She has a normal mood and affect. Her behavior is normal.    ED Course  Procedures (including critical care time) Labs Review Labs Reviewed  COMPREHENSIVE METABOLIC PANEL - Abnormal; Notable for the following:    Potassium 3.3 (*)    Chloride 100 (*)    CO2 19 (*)    BUN <5 (*)    Total Protein 6.3 (*)    AST 192 (*)    ALT 102 (*)    Anion gap 21 (*)    All other components within normal limits  CBC WITH DIFFERENTIAL/PLATELET - Abnormal; Notable for the following:    RBC 3.20 (*)    HCT 35.6 (*)    MCV 111.3 (*)    MCH 39.4 (*)    All other components within normal limits  URINALYSIS, ROUTINE W REFLEX MICROSCOPIC (NOT AT [facility]) - Abnormal; Notable for the following:    APPearance CLOUDY (*)    Ketones, ur 40 (*)    Protein, ur 30 (*)    All other components within  normal limits  URINE MICROSCOPIC-ADD ON - Abnormal; Notable for the following:    Squamous Epithelial / LPF 6-30 (*)    Bacteria, UA RARE (*)    Casts HYALINE CASTS (*)    All other components within normal limits  I-STAT CG4 LACTIC ACID, ED  I-STAT CG4 LACTIC ACID, ED    Imaging Review Dg Chest 2 View  10/28/2015  CLINICAL DATA:  Increased weakness.  Decreased appetite for 4 days. EXAM: CHEST  2 VIEW COMPARISON:  09/22/2015 FINDINGS: The heart size and mediastinal contours are within normal limits. Both lungs are clear. The visualized skeletal structures are unremarkable. IMPRESSION: No active cardiopulmonary disease. Electronically Signed   By: Rolm Baptise M.D.   On: 10/28/2015 13:49   I have personally reviewed and evaluated these images and lab results as part of my medical decision-making.  EKG Interpretation None      MDM   Final diagnoses:  Generalized abdominal pain  Gastritis  Alcoholism ([place])    Cities reassuring. I did talk with her about her liver enzymes. Did discuss liver failure and cirrhosis and other abnormalities that would be calm likely for her with her continued drinking. Her symptoms today are consistent with a gastritis or ulcer. Plan is Prilosec. Encouraged her to stop smoking and all alcohol. GI follow-up. Discussed return precautions including jaundiced, confusion, bleeding, dark or black stools, hypotension or syncope.    Tanna Furry, MD 10/28/15 2124

## 2015-10-28 NOTE — Discharge Instructions (Signed)
Stop smoking and stop drinking, this will help your gastritis and/or ulcer heal. Community resources regarding alcohol treatment programs are listed at the bottom of this handout. Prilosec twice per day until your symptoms improve.     Abdominal Pain, Adult Many things can cause abdominal pain. Usually, abdominal pain is not caused by a disease and will improve without treatment. It can often be observed and treated at home. Your health care provider will do a physical exam and possibly order blood tests and X-rays to help determine the seriousness of your pain. However, in many cases, more time must pass before a clear cause of the pain can be found. Before that point, your health care provider may not know if you need more testing or further treatment. HOME CARE INSTRUCTIONS Monitor your abdominal pain for any changes. The following actions may help to alleviate any discomfort you are experiencing:  Only take over-the-counter or prescription medicines as directed by your health care provider.  Do not take laxatives unless directed to do so by your health care provider.  Try a clear liquid diet (broth, tea, or water) as directed by your health care provider. Slowly move to a bland diet as tolerated. SEEK MEDICAL CARE IF:  You have unexplained abdominal pain.  You have abdominal pain associated with nausea or diarrhea.  You have pain when you urinate or have a bowel movement.  You experience abdominal pain that wakes you in the night.  You have abdominal pain that is worsened or improved by eating food.  You have abdominal pain that is worsened with eating fatty foods.  You have a fever. SEEK IMMEDIATE MEDICAL CARE IF:  Your pain does not go away within 2 hours.  You keep throwing up (vomiting).  Your pain is felt only in portions of the abdomen, such as the right side or the left lower portion of the abdomen.  You pass bloody or black tarry stools. MAKE SURE  YOU:  Understand these instructions.  Will watch your condition.  Will get help right away if you are not doing well or get worse.   This information is not intended to replace advice given to you by your health care provider. Make sure you discuss any questions you have with your health care provider.   Document Released: 07/01/2005 Document Revised: 06/12/2015 Document Reviewed: 05/31/2013 Elsevier Interactive Patient Education 2016 Elsevier Inc.  Gastritis, Adult Gastritis is soreness and puffiness (inflammation) of the lining of the stomach. If you do not get help, gastritis can cause bleeding and sores (ulcers) in the stomach. HOME CARE   Only take medicine as told by your doctor.  If you were given antibiotic medicines, take them as told. Finish the medicines even if you start to feel better.  Drink enough fluids to keep your pee (urine) clear or pale yellow.  Avoid foods and drinks that make your problems worse. Foods you may want to avoid include:  Caffeine or alcohol.  Chocolate.  Mint.  Garlic and onions.  Spicy foods.  Citrus fruits, including oranges, lemons, or limes.  Food containing tomatoes, including sauce, chili, salsa, and pizza.  Fried and fatty foods.  Eat small meals throughout the day instead of large meals. GET HELP RIGHT AWAY IF:   You have black or dark red poop (stools).  You throw up (vomit) blood. It may look like coffee grounds.  You cannot keep fluids down.  Your belly (abdominal) pain gets worse.  You have a fever.  You  do not feel better after 1 week.  You have any other questions or concerns. MAKE SURE YOU:   Understand these instructions.  Will watch your condition.  Will get help right away if you are not doing well or get worse.   This information is not intended to replace advice given to you by your health care provider. Make sure you discuss any questions you have with your health care provider.   Document  Released: 03/09/2008 Document Revised: 12/14/2011 Document Reviewed: 11/04/2011 Elsevier Interactive Patient Education 2016 [employer].  Finding Treatment for Addiction WHAT IS ADDICTION? Addiction is a complex disease of the brain. It causes an uncontrollable (compulsive) need for a substance. You can be addicted to alcohol, illegal drugs, or prescription medicines such as painkillers. Addiction can also be a behavior, like gambling or shopping. The need for the drug or activity can become so strong that you think about it all the time. You can also become physically dependent on a substance. Addiction can change the way your brain works. Because of these changes, getting more of whatever you are addicted to becomes the most important thing to you and feels better than other activities or relationships. Addiction can lead to changes in health, behavior, emotions, relationships, and choices that affect you and everyone around you. HOW DO I KNOW IF I NEED TREATMENT FOR ADDICTION? Addiction is a progressive disease. Without treatment, addiction can get worse. Living with addiction puts you at higher risk for injury, poor health, lost employment, loss of money, and even death. You might need treatment for addiction if:  You have tried to stop or cut down, but you cannot.  Your addiction is causing physical health problems.  You find it annoying that your friends and family are concerned about your alcohol or substance use.  You feel guilty about substance abuse or a compulsive behavior.  You have lied or tried to hide your addiction.  You need a particular substance or activity to start your day or to calm down.  You are getting in trouble at school, work, home, or with the police.  You have done something illegal to support your addiction.  You are running out of money because of your addiction.  You have no time for anything other than your addiction. WHAT TYPES OF TREATMENT ARE  AVAILABLE? The treatment program that is right for you will depend on many factors, including the type of addiction you have. Treatment programs can be outpatient or inpatient. In an outpatient program, you live at home and go to work or school, but you also go to a clinic for treatment. With an inpatient program, you live and sleep at the program facility during treatment. After treatment, you might need a plan for support during recovery. Other treatment options include:   Medicine.  Some addictions may be treated with prescription medicines.  You might also need medicine to treat anxiety or depression.  Counseling and behavior therapy. Therapy can help individuals and families behave in healthier ways and relate more effectively.  Support groups. Confidential group therapy, such as a 12-step program, can help individuals and families during treatment and recovery. No single type of program is right for everyone. Many treatment programs involve a combination of education, counseling, and a 12-step, spiritually-based approach. Some treatment programs are government sponsored. They are geared for patients who do not have private insurance. Treatment programs can vary in many respects, such as:  Cost and types of insurance that are accepted.  Types of on-site medical services that are offered.  Length of stay, setting, and size.  Overall philosophy of treatment. WHAT SHOULD I CONSIDER WHEN SELECTING A TREATMENT PROGRAM? It is important to think about your individual requirements when selecting a treatment program. There are a number of things to consider, such as:  If the program is certified by the appropriate government agency. Even private programs must be certified and employ certified professionals.  If the program is covered by your insurance. If finances are a concern, the first call you should make is to your insurance company, if you have health insurance. Ask for a list of  treatment programs that are in your network, and confirm any copayments and deductibles that you may have to pay.  If you do not have insurance, or if you choose to attend a program that does not accept your insurance, discuss whether a payment plan can be set up.  If treatment is available in languages other than English, if needed.  If the program offers detoxification treatment, if needed.  If 12-step meetings are held at the center or if transport is available for patients to attend meetings at other locations.  If the program is professional, organized, and clean.  If the program meets all of your needs, including physical and cultural needs.  If the facility offers specific treatment for your particular addiction.  If support continues to be offered after you have left the program.  If your treatment plan is continually looked at to make sure you are receiving the right treatment at the right time.  If mental health counseling is part of your treatment.  If medicine is included in treatment, if needed.  If your family is included in your treatment plan and if support is offered to them throughout the treatment process.  How the treatment works to prevent relapse. WHERE ELSE CAN I GET HELP?  Your health care provider. Ask him or her to help you find addiction treatment. These discussions are confidential.  The [employer] on Alcoholism and Drug Dependence (NCADD). This group has information about treatment centers and programs for people who have an addiction and for family members.  The telephone number is 1-800-NCA-CALL (413-265-5238).  The website is https://ncadd.org/about-ncadd/our-affiliates  The Substance Abuse and Mental Health Services Administration [facility]). This group will help you find publicly funded treatment centers, help hotlines, and counseling services near you.  The telephone number is 1-800-662-HELP 325-110-0012).  The website is  www.findtreatment.[employer].com.cy In countries outside of the [place] and [place], look in [employer] for contact information for services in your area.   This information is not intended to replace advice given to you by your health care provider. Make sure you discuss any questions you have with your health care provider.   Document Released: 08/20/2005 Document Revised: 06/12/2015 Document Reviewed: 07/10/2014 Elsevier Interactive Patient Education 2016 Elsevier Inc.  Gastritis, Adult Gastritis is soreness and swelling (inflammation) of the lining of the stomach. Gastritis can develop as a sudden onset (acute) or long-term (chronic) condition. If gastritis is not treated, it can lead to stomach bleeding and ulcers. CAUSES  Gastritis occurs when the stomach lining is weak or damaged. Digestive juices from the stomach then inflame the weakened stomach lining. The stomach lining may be weak or damaged due to viral or bacterial infections. One common bacterial infection is the Helicobacter pylori infection. Gastritis can also result from excessive alcohol consumption, taking certain medicines, or having too much acid in the stomach.  SYMPTOMS  In some cases, there are no symptoms. When symptoms are present, they may include:  Pain or a burning sensation in the upper abdomen.  Nausea.  Vomiting.  An uncomfortable feeling of fullness after eating. DIAGNOSIS  Your caregiver may suspect you have gastritis based on your symptoms and a physical exam. To determine the cause of your gastritis, your caregiver may perform the following:  Blood or stool tests to check for the H pylori bacterium.  Gastroscopy. A thin, flexible tube (endoscope) is passed down the esophagus and into the stomach. The endoscope has a light and camera on the end. Your caregiver uses the endoscope to view the inside of the stomach.  Taking a tissue sample (biopsy) from the stomach to examine under a microscope. TREATMENT   Depending on the cause of your gastritis, medicines may be prescribed. If you have a bacterial infection, such as an H pylori infection, antibiotics may be given. If your gastritis is caused by too much acid in the stomach, H2 blockers or antacids may be given. Your caregiver may recommend that you stop taking aspirin, ibuprofen, or other nonsteroidal anti-inflammatory drugs (NSAIDs). HOME CARE INSTRUCTIONS  Only take over-the-counter or prescription medicines as directed by your caregiver.  If you were given antibiotic medicines, take them as directed. Finish them even if you start to feel better.  Drink enough fluids to keep your urine clear or pale yellow.  Avoid foods and drinks that make your symptoms worse, such as:  Caffeine or alcoholic drinks.  Chocolate.  Peppermint or mint flavorings.  Garlic and onions.  Spicy foods.  Citrus fruits, such as oranges, lemons, or limes.  Tomato-based foods such as sauce, chili, salsa, and pizza.  Fried and fatty foods.  Eat small, frequent meals instead of large meals. SEEK IMMEDIATE MEDICAL CARE IF:   You have black or dark red stools.  You vomit blood or material that looks like coffee grounds.  You are unable to keep fluids down.  Your abdominal pain gets worse.  You have a fever.  You do not feel better after 1 week.  You have any other questions or concerns. MAKE SURE YOU:  Understand these instructions.  Will watch your condition.  Will get help right away if you are not doing well or get worse.   This information is not intended to replace advice given to you by your health care provider. Make sure you discuss any questions you have with your health care provider.    Substance Abuse Resources Organization         Address  Phone  Notes  Alcohol and Drug Services  [phone]   [facility]  [phone]   [facility]   Chinita Pester  [phone]   Residential &  Outpatient Substance Abuse Program  [phone]     Self-Help/Support Groups Organization         Address  Phone             Notes  [facility] [place] - variety of support groups  [place] Call for more information  Narcotics Anonymous (NA), [facility] [address], [employer] [place]  2 meetings at this location   Special educational needs teacher         Address  Phone  Notes  [facility] [place],    [place]  [address] [place], [place], [place]  [place], [place] [phone] Admissions: 8am-3pm M-F  [facility]stance [place] [address]    [place], [place] [phone]   [facility] [address] [place], [place], [place]   [facility] [address]  [place], [place]   [facility] - Intensive Outpatient [address], [place], [place]   [facility] ([place] [address]  [place], [place] [phone] or [phone]   [facility] ([facility]) [address], [place], [place] Accepts Medicaid  [facility] [place] [address],  [place] [phone] Substance Abuse/Addiction Treatment   [facility] Organization         Address  Phone  Notes  [facility]  [phone]   Domenic Schwab, PhD [address]a [place], [place]   [phone] or [phone]   [place] [address] [place], [place] [phone]   [facility] [address], [place], [place] [phone] Insurance/Medicaid/sponsorship through [facility] Families [address] [place]                                    [place], [place] [phone] [place] [address], [place]  [phone]    Dr. Adele Schilder  [phone]   [facility] Dept. 1) [address] [place] 2) [place] 3)  [place], [place] [phone]  [phone]   [place] [phone] or [phone] (After Hours)

## 2015-10-28 NOTE — ED Notes (Signed)
Patient here with increased weakness, and decreased appetite x 4 days, vomited lasty pm, discharged to home from sepsis 2 weeks ago.

## 2015-10-29 ENCOUNTER — Ambulatory Visit: Payer: BLUE CROSS/BLUE SHIELD | Admitting: Physical Therapy

## 2015-10-31 ENCOUNTER — Ambulatory Visit: Payer: BLUE CROSS/BLUE SHIELD | Admitting: Physical Therapy

## 2015-11-05 ENCOUNTER — Encounter: Payer: Self-pay | Admitting: Family Medicine

## 2015-11-05 ENCOUNTER — Ambulatory Visit (INDEPENDENT_AMBULATORY_CARE_PROVIDER_SITE_OTHER): Payer: BLUE CROSS/BLUE SHIELD | Admitting: Family Medicine

## 2015-11-05 DIAGNOSIS — R1084 Generalized abdominal pain: Secondary | ICD-10-CM | POA: Diagnosis not present

## 2015-11-05 DIAGNOSIS — F1010 Alcohol abuse, uncomplicated: Secondary | ICD-10-CM

## 2015-11-05 DIAGNOSIS — K219 Gastro-esophageal reflux disease without esophagitis: Secondary | ICD-10-CM | POA: Diagnosis not present

## 2015-11-05 DIAGNOSIS — R634 Abnormal weight loss: Secondary | ICD-10-CM | POA: Diagnosis not present

## 2015-11-05 LAB — COMPREHENSIVE METABOLIC PANEL
ALT: 71 U/L — ABNORMAL HIGH (ref 0–35)
AST: 87 U/L — ABNORMAL HIGH (ref 0–37)
Albumin: 4.5 g/dL (ref 3.5–5.2)
Alkaline Phosphatase: 65 U/L (ref 39–117)
BUN: 5 mg/dL — ABNORMAL LOW (ref 6–23)
CO2: 19 mEq/L (ref 19–32)
Calcium: 10.3 mg/dL (ref 8.4–10.5)
Chloride: 99 mEq/L (ref 96–112)
Creatinine, Ser: 0.67 mg/dL (ref 0.40–1.20)
GFR: 100.81 mL/min (ref >60.00)
Glucose, Bld: 69 mg/dL — ABNORMAL LOW (ref 70–99)
Potassium: 3.8 mEq/L (ref 3.5–5.1)
Sodium: 140 mEq/L (ref 135–145)
Total Bilirubin: 0.6 mg/dL (ref 0.2–1.2)
Total Protein: 6.8 g/dL (ref 6.0–8.3)

## 2015-11-05 LAB — CBC WITH DIFFERENTIAL/PLATELET
Basophils Absolute: 0 10*3/uL (ref 0.0–0.1)
Basophils Relative: 0.4 % (ref 0.0–3.0)
Eosinophils Absolute: 0.1 10*3/uL (ref 0.0–0.7)
Eosinophils Relative: 1.1 % (ref 0.0–5.0)
HCT: 38.4 % (ref 36.0–46.0)
Hemoglobin: 13 g/dL (ref 12.0–15.0)
Lymphocytes Relative: 19.2 % (ref 12.0–46.0)
Lymphs Abs: 1.6 10*3/uL (ref 0.7–4.0)
MCHC: 33.8 g/dL (ref 30.0–36.0)
MCV: 114.3 fl — ABNORMAL HIGH (ref 78.0–100.0)
Monocytes Absolute: 1.6 10*3/uL — ABNORMAL HIGH (ref 0.1–1.0)
Monocytes Relative: 19.3 % — ABNORMAL HIGH (ref 3.0–12.0)
Neutro Abs: 5.1 10*3/uL (ref 1.4–7.7)
Neutrophils Relative %: 60 % (ref 43.0–77.0)
Platelets: 198 10*3/uL (ref 150.0–400.0)
RBC: 3.36 Mil/uL — ABNORMAL LOW (ref 3.87–5.11)
RDW: 14.6 % (ref 11.5–15.5)
WBC: 8.4 10*3/uL (ref 4.0–10.5)

## 2015-11-05 LAB — LIPASE: Lipase: 722 U/L — ABNORMAL HIGH (ref 11.0–59.0)

## 2015-11-05 MED ORDER — PANTOPRAZOLE SODIUM 40 MG PO TBEC: 40.0000 mg | DELAYED_RELEASE_TABLET | Freq: Two times a day (BID) | ORAL | Status: DC

## 2015-11-05 NOTE — Progress Notes (Signed)
Subjective:    Patient ID: Natalie Morrison, female    DOB: 13-Oct-1970, 45 y.o.   MRN: [id number]  HPI  patient seen for ER follow-up. She was admitted back in December for presumed sepsis though no etiology was found. She has history of alcoholism and was doing reasonably well other than relapsing on her alcohol until around Jan rate 22nd when she developed some nausea and recurrent vomiting. Denied any melena or hematemesis. No fever. Seen in emergency department on 10/28/2015. Vital signs were stable. Labs were significant for potassium 3.3 , AST 192, ALT 102. Urinalysis was basically unremarkable other than slight ketones. No lipase or amylase done. Chest x-ray no acute findings. She was diagnosed with possible gastritis versus ulcer and was sent home on Prilosec 20 mg which is currently taking twice daily.   She complains of some poor appetite, generalized weakness , and diffuse abdominal pain but also epigastric pain. She states she has had some small volume bowel movements but no normal bowel movement for about 8-10 days. She has lost about 5 pounds since she was here in December. She has not described any melena. No history of known peptic ulcer disease. No dysphagia.   She has a very long history of alcohol abuse and we had a long talk last visit about seriousness of this including risk of death from cirrhosis and other complications. She does not have any history of known esophageal varices bleed. Recent INR 1.09. She generally drinks  Several ounces of whiskey per day. She has consider support programs and is interested at this time proceeding with that. She states she has picked a quit date for alcohol for this coming Friday.  Past Medical History  Diagnosis Date  . Asthma    Past Surgical History  Procedure Laterality Date  . Breast lumpectomy      reports that she has been smoking Cigarettes.  She has a 12.5 pack-year smoking history. She does not have any smokeless tobacco history  on file. She reports that she drinks about 1.8 oz of alcohol per week. She reports that she does not use illicit drugs. family history is not on file. Allergies  Allergen Reactions  . Nitrofurantoin Hives  . Other Other (See Comments)    Allergies mold, dust per allergy test  . Sulfonamide Derivatives Other (See Comments)    Unknown allergic reaction per husband       Review of Systems  Constitutional: Positive for fatigue. Negative for fever and chills.  HENT: Negative for trouble swallowing.   Respiratory: Negative for shortness of breath.   Cardiovascular: Negative for chest pain, palpitations and leg swelling.  Gastrointestinal: Positive for abdominal pain and constipation. Negative for vomiting, diarrhea and blood in stool.  Genitourinary: Negative for dysuria.  Neurological: Negative for dizziness.       Objective:   Physical Exam  Constitutional: She is oriented to person, place, and time. She appears well-developed and well-nourished.  HENT:  Mouth/Throat: Oropharynx is clear and moist.  Cardiovascular:  Rhythm is regular and her heart rate on repeat after rest is about 100  Pulmonary/Chest: Effort normal and breath sounds normal. No respiratory distress. She has no wheezes. She has no rales.  Abdominal: Soft. Bowel sounds are normal. She exhibits no mass. There is no rebound and no guarding.  She has normal bowel sounds but does appear to be slightly distended but no localizing tenderness. She has minimal epigastric tenderness but also minimal diffuse tenderness throughout her abdomen. No  guarding or rebound.  Musculoskeletal: She exhibits no edema.  Neurological: She is alert and oriented to person, place, and time.  Psychiatric: She has a normal mood and affect. Her behavior is normal.          Assessment & Plan:  Long-standing history of alcoholism. Patient presents with poorly localized abdominal pain, decreased appetite, weight loss, but no evidence for  active bleeding. No history of known esophageal varices, ascites, or spontaneous bacterial peritonitis. She has never had upper endoscopy. We've recommended the following:  #1 GI referral.  Will probably need EGD to evaluate her symptoms above and rule out varices #2 continue multivitamin one daily #3 Protonix 40 mg twice a day #4 repeat labs with comprehensive metabolic panel, lipase, ammonia level #5 patient was given information for support programs. We discussed both inpatient and outpatient programs. She is interested in the latter. [facility] #6 consider MiraLAX as needed for constipation #7 follow-up immediately for any dizziness, hematemesis, melena, fever, dyspnea, or progressive abdominal pain #8 she'll be Out of work until further evaluation

## 2015-11-05 NOTE — Patient Instructions (Signed)
Stop the OTC Prilosec Start Protonix 40 mg twice daily Continue with Multivitamin one daily We will call you with GI appt Consider Miralax for constipation issues. Set up referral to outpatient support program.

## 2015-11-05 NOTE — Progress Notes (Signed)
Pre visit review using our clinic review tool, if applicable. No additional management support is needed unless otherwise documented below in the visit note. 

## 2015-11-06 ENCOUNTER — Other Ambulatory Visit: Payer: Self-pay | Admitting: Family Medicine

## 2015-11-06 ENCOUNTER — Encounter: Payer: Self-pay | Admitting: Gastroenterology

## 2015-11-06 ENCOUNTER — Telehealth: Payer: Self-pay | Admitting: Family Medicine

## 2015-11-06 ENCOUNTER — Ambulatory Visit (INDEPENDENT_AMBULATORY_CARE_PROVIDER_SITE_OTHER): Payer: BLUE CROSS/BLUE SHIELD | Admitting: Gastroenterology

## 2015-11-06 VITALS — BP 120/86 | HR 68 | Ht 66.0 in | Wt 116.2 lb

## 2015-11-06 DIAGNOSIS — E46 Unspecified protein-calorie malnutrition: Secondary | ICD-10-CM | POA: Diagnosis not present

## 2015-11-06 DIAGNOSIS — F1010 Alcohol abuse, uncomplicated: Secondary | ICD-10-CM | POA: Diagnosis not present

## 2015-11-06 DIAGNOSIS — R1084 Generalized abdominal pain: Secondary | ICD-10-CM

## 2015-11-06 DIAGNOSIS — G8929 Other chronic pain: Secondary | ICD-10-CM

## 2015-11-06 DIAGNOSIS — R1110 Vomiting, unspecified: Secondary | ICD-10-CM | POA: Diagnosis not present

## 2015-11-06 DIAGNOSIS — R1013 Epigastric pain: Secondary | ICD-10-CM | POA: Diagnosis not present

## 2015-11-06 LAB — AMMONIA: Ammonia: 141 umol/L — ABNORMAL HIGH (ref 16–53)

## 2015-11-06 MED ORDER — PROMETHAZINE HCL 12.5 MG PO TABS: 12.5000 mg | ORAL_TABLET | Freq: Three times a day (TID) | ORAL | Status: DC | PRN

## 2015-11-06 NOTE — Telephone Encounter (Signed)
Pt was seen by GI today. They will not perform her EGD until 12/11/2015. Im assuming if GI accessed her then this date should be fine. Any other recommendations?

## 2015-11-06 NOTE — Telephone Encounter (Signed)
Patient returned CMA's telephone call.

## 2015-11-06 NOTE — Patient Instructions (Signed)
Alcohol withdrawal syndrome is a set of symptoms that can occur following a reduction in alcohol use after a period of excessive use. Symptoms typically include anxiety, shakiness, sweating, vomiting, fast heart rate, and a mild fever. More severe symptoms may include seizures, seeing or hearing things that others do not, and delirium tremens (DTs).[1] Symptoms typically begin around six hours following the last drink, are worst at 24 to 72 hours, and improve by seven days.[2][3]  If you experience any of these symptoms from alcohol withdrawal please go to your nearest emergency room  You have been scheduled for an endoscopy. Please follow written instructions given to you at your visit today. If you use inhalers (even only as needed), please bring them with you on the day of your procedure. Your physician has requested that you go to www.startemmi.[email or web address] and enter the access code given to you at your visit today. This web site gives a general overview about your procedure. However, you should still follow specific instructions given to you by our office regarding your preparation for the procedure.

## 2015-11-06 NOTE — Telephone Encounter (Signed)
The Gi folks would have to make that determination.

## 2015-11-06 NOTE — Progress Notes (Signed)
Natalie Morrison    [id number]    [birth year]  [facility] [address], MD  Referring Physician: Eulas Post, MD [place], [place] [address]  Chief complaint:  Abdominal pain, nausea and vomiting  HPI: 45 year old female here with complaints of abdominal pain, nausea and vomiting for the past 2 weeks. She has history of chronic alcohol abuse, drinks about 4-5 Drinks of whiskey per day . She was admitted in December for presumed sepsis  Patient was seen in emergency room last week with similar symptoms. She is taking Phenergan as needed. Reports continued to drink alcohol but has decreased the amount she drinks. She hasn't been able to eat much due to the nausea and vomiting. She also reported making less urine since yesterday, thinks she is drinking enough fluids Her labs are remarkable for Albumin 4.5, AST 87, ALT 71, total bilirubin 0.6 , alkaline phosphatase 65 , platelet count 198, hemoglobin 13.   Outpatient Encounter Prescriptions as of 11/06/2015  Medication Sig  . albuterol (PROVENTIL,VENTOLIN) 90 MCG/ACT inhaler Inhale 2 puffs into the lungs every 6 (six) hours as needed for wheezing or shortness of breath. Per Dr Velora Heckler, rescue inhaler  . ALPRAZolam (XANAX) 0.25 MG tablet Take 1 tablet (0.25 mg total) by mouth daily as needed for anxiety.  Marland Kitchen arformoterol (BROVANA) 15 MCG/2ML NEBU Take 2 mLs (15 mcg total) by nebulization 2 (two) times daily.  . budesonide (PULMICORT) 0.25 MG/2ML nebulizer solution Take 2 mLs (0.25 mg total) by nebulization 2 (two) times daily.  . cetirizine (ZYRTEC) 10 MG chewable tablet Chew 10 mg by mouth at bedtime.   . Cyanocobalamin (VITAMIN B-12 PO) Take 1 tablet by mouth daily.  . fluticasone (FLONASE) 50 MCG/ACT nasal spray Place 1 spray into both nostrils daily.  Marland Kitchen GLUCOSAMINE-CHONDROITIN PO Take 1 tablet by mouth daily.  . Multiple Vitamin (MULTIVITAMIN WITH MINERALS) TABS tablet Take 1 tablet by  mouth daily. One a Day Women's  . pantoprazole (PROTONIX) 40 MG tablet Take 1 tablet (40 mg total) by mouth 2 (two) times daily.  . potassium chloride SA (K-DUR,KLOR-CON) 20 MEQ tablet Take 1 tablet (20 mEq total) by mouth daily.  . promethazine (PHENERGAN) 12.5 MG tablet Take 1 tablet (12.5 mg total) by mouth every 6 (six) hours as needed for nausea or vomiting.  . thiamine 100 MG tablet Take 1 tablet (100 mg total) by mouth daily.   No facility-administered encounter medications on file as of 11/06/2015.    Allergies as of 11/06/2015 - Review Complete 11/05/2015  Allergen Reaction Noted  . Nitrofurantoin Hives 01/01/2010  . Other Other (See Comments) 09/21/2015  . Sulfonamide derivatives Other (See Comments)     Past Medical History  Diagnosis Date  . Asthma     Past Surgical History  Procedure Laterality Date  . Breast lumpectomy      No family history on file.  Social History   Social History  . Marital Status: Married    Spouse Name: N/A  . Number of Children: N/A  . Years of Education: N/A   Occupational History  . Not on file.   Social History Main Topics  . Smoking status: Current Every Day Smoker -- 0.50 packs/day for 25 years    Types: Cigarettes  . Smokeless tobacco: Not on file  . Alcohol Use: 1.8 oz/week    3 Shots of liquor per week  . Drug Use: No  . Sexual Activity:  Not on file   Other Topics Concern  . Not on file   Social History Narrative      Review of systems: Review of Systems  Constitutional: Negative for fever and chills.  HENT: Negative.   Eyes: Negative for blurred vision.  Respiratory: Negative for cough, shortness of breath and wheezing.   Cardiovascular: Negative for chest pain and palpitations.  Gastrointestinal: as per HPI Genitourinary: Negative for dysuria, urgency, frequency and hematuria.  Musculoskeletal: Negative for myalgias, back pain and joint pain.  Skin: Negative for itching and rash.  Neurological: Negative  for dizziness, tremors, focal weakness, seizures and loss of consciousness.  Endo/Heme/Allergies: Negative for environmental allergies.  Psychiatric/Behavioral: Negative for depression, suicidal ideas and hallucinations.  All other systems reviewed and are negative.   Physical Exam: Filed Vitals:   11/06/15 1332  BP: 120/86  Pulse: 68   Gen:    cachectic, no acute distresss HEENT:  EOMI, sclera anicteric Neck:     No masses; no thyromegaly Lungs:    Clear to auscultation bilaterally; normal respiratory effort CV:         Regular rate and rhythm; no murmurs Abd:      + bowel sounds; soft, non-tender; no palpable masses, no distension Ext:    No edema; adequate peripheral perfusion Skin:      Warm and dry; no rash Neuro: alert and oriented x 3,Mild tremors  Psych: normal mood and affect  Data Reviewed:  As per history of present illness  Assessment and Plan/Recommendations: 45 year old female with history of chronic alcohol abuse, here with complaints of epigastric abdominal pain associated with nausea and vomiting for the past 2 weeks Patient appears to be severely malnourished. She probably consumes more alcohol than she admits to. No evidence of cirrhosis based on exam or clinical data We'll schedule for EGD for evaluation of possible gastric ulcer/ gastritis  Continue PPI Phenergan as needed Advised patient to seek medical care if she starts withdrawing from alcohol once she quits Maintain adequate hydration and small frequent meals  K. Denzil Magnuson , MD [phone] Mon-Fri 8a-5p [phone] after 5p, weekends, holidays

## 2015-11-06 NOTE — Telephone Encounter (Signed)
Spoke with patient and she is aware of labs.

## 2015-11-06 NOTE — Addendum Note (Signed)
Addended by: Elio Forget on: 11/06/2015 09:45 AM   Modules accepted: Orders

## 2015-11-06 NOTE — Telephone Encounter (Signed)
Pt said GI doctor can not see her until 12/11/15 She is asking if there is a way to speed this req up.

## 2015-11-07 NOTE — Telephone Encounter (Signed)
Pt is aware and agreeable

## 2015-11-12 ENCOUNTER — Ambulatory Visit
Admission: RE | Admit: 2015-11-12 | Discharge: 2015-11-12 | Disposition: A | Discharge status: Home / Self Care | Payer: BLUE CROSS/BLUE SHIELD | Source: Ambulatory Visit | Attending: Family Medicine | Admitting: Family Medicine

## 2015-11-12 DIAGNOSIS — R1084 Generalized abdominal pain: Secondary | ICD-10-CM

## 2015-11-19 ENCOUNTER — Telehealth: Payer: Self-pay | Admitting: Family Medicine

## 2015-11-19 NOTE — Telephone Encounter (Signed)
FMLA paper done.

## 2015-11-19 NOTE — Telephone Encounter (Signed)
This is in your "to do" folder.

## 2015-11-19 NOTE — Telephone Encounter (Signed)
Patient would like the status of her FMLA papers that were dropped off last Tuesday.

## 2015-11-20 NOTE — Telephone Encounter (Signed)
Pt is aware via voicemail that form is completed and up front for pick up.

## 2015-12-02 ENCOUNTER — Institutional Professional Consult (permissible substitution): Payer: BLUE CROSS/BLUE SHIELD | Admitting: Internal Medicine

## 2015-12-03 ENCOUNTER — Ambulatory Visit (INDEPENDENT_AMBULATORY_CARE_PROVIDER_SITE_OTHER): Payer: BLUE CROSS/BLUE SHIELD | Admitting: Family Medicine

## 2015-12-03 VITALS — BP 100/80 | HR 112 | Temp 97.7°F | Ht 66.0 in | Wt 123.7 lb

## 2015-12-03 DIAGNOSIS — E440 Moderate protein-calorie malnutrition: Secondary | ICD-10-CM

## 2015-12-03 DIAGNOSIS — I10 Essential (primary) hypertension: Secondary | ICD-10-CM

## 2015-12-03 DIAGNOSIS — R1013 Epigastric pain: Secondary | ICD-10-CM

## 2015-12-03 DIAGNOSIS — F1010 Alcohol abuse, uncomplicated: Secondary | ICD-10-CM | POA: Diagnosis not present

## 2015-12-03 NOTE — Progress Notes (Signed)
Pre visit review using our clinic review tool, if applicable. No additional management support is needed unless otherwise documented below in the visit note. 

## 2015-12-03 NOTE — Progress Notes (Signed)
   Subjective:    Patient ID: Natalie Morrison, female    DOB: 30-Jun-1971, 45 y.o.   MRN: [id number]  HPI Patient seen for follow-up regarding alcohol abuse with recent abdominal pain. She has seen GI and has planned endoscopy. She remains on proton pump inhibitor. Abdominal pain improved.  She came off alcohol completely February 3 and did well until brief relapse of one episode of drinking one week ago today but none since then. She's gained 7 pounds body weight and feels much better overall.   We have strongly advised outpatient support program but she has declined that.  She started exercising more with walking and is already walking up to 1-2 loss per day without difficulty.  She remains on multivitamin. Recent labs unremarkable with exception of declining AST and ALTs levels. Albumin 4.5. No exam findings consistent with cirrhosis  Past Medical History  Diagnosis Date  . Asthma   . Alcohol abuse   . Anxiety and depression   . Hypertension    Past Surgical History  Procedure Laterality Date  . Breast lumpectomy      reports that she has been smoking Cigarettes.  She has a 12.5 pack-year smoking history. She has never used smokeless tobacco. She reports that she drinks about 1.8 oz of alcohol per week. She reports that she does not use illicit drugs. family history includes Heart disease in her father and mother; Liver disease in her mother. Allergies  Allergen Reactions  . Nitrofurantoin Hives  . Other Other (See Comments)    Allergies mold, dust per allergy test  . Sulfonamide Derivatives Other (See Comments)    Unknown allergic reaction per husband       Review of Systems  Constitutional: Negative for fatigue.  Eyes: Negative for visual disturbance.  Respiratory: Negative for cough, chest tightness, shortness of breath and wheezing.   Cardiovascular: Negative for chest pain, palpitations and leg swelling.  Endocrine: Negative for polydipsia and polyuria.  Genitourinary:  Negative for dysuria.  Neurological: Negative for dizziness, seizures, syncope, weakness, light-headedness and headaches.       Objective:   Physical Exam  Constitutional: She appears well-developed and well-nourished. No distress.  HENT:  Mouth/Throat: Oropharynx is clear and moist.  Neck: Neck supple. No thyromegaly present.  Cardiovascular: Normal rate and regular rhythm.   Pulmonary/Chest: Breath sounds normal. No respiratory distress. She has no wheezes. She has no rales.  Abdominal: Soft. Bowel sounds are normal. She exhibits no distension and no mass. There is no tenderness. There is no rebound and no guarding.  Musculoskeletal: She exhibits no edema.  Skin: No rash noted.  Psychiatric: She has a normal mood and affect.          Assessment & Plan:   #1 alcoholism. Patient recently quit drinking and with exception of one relapse has been abstinent over the past month. She feels much better overall. We again encouraged her consider outpatient support program but she declines.   #2 recent epigastric abdominal pain. ?gastritis vs Ulcer .  Symptomatically improved. She remains on PPI. She has scheduled EGD pending.  No symptoms to suggest active bleeding.   #3 malnutrition. Weight has increased 7 pounds since she stopped drinking and she is feeling much better overall. Continue multivitamin for now.  #4 smoking hx.  Still smoking cigarettes.  We have not recommended quitting until she is stable off ETOH.

## 2015-12-06 ENCOUNTER — Other Ambulatory Visit: Payer: Self-pay | Admitting: Family Medicine

## 2015-12-10 ENCOUNTER — Ambulatory Visit: Payer: BLUE CROSS/BLUE SHIELD | Admitting: Emergency Medicine

## 2015-12-11 ENCOUNTER — Encounter: Payer: Self-pay | Admitting: Gastroenterology

## 2015-12-11 ENCOUNTER — Ambulatory Visit (AMBULATORY_SURGERY_CENTER): Payer: BLUE CROSS/BLUE SHIELD | Admitting: Gastroenterology

## 2015-12-11 VITALS — BP 118/72 | HR 65 | Temp 98.9°F | Resp 17 | Ht 66.0 in | Wt 116.0 lb

## 2015-12-11 DIAGNOSIS — K2990 Gastroduodenitis, unspecified, without bleeding: Secondary | ICD-10-CM

## 2015-12-11 DIAGNOSIS — R1013 Epigastric pain: Secondary | ICD-10-CM | POA: Diagnosis not present

## 2015-12-11 DIAGNOSIS — K2970 Gastritis, unspecified, without bleeding: Secondary | ICD-10-CM

## 2015-12-11 MED ORDER — SODIUM CHLORIDE 0.9 % IV SOLN: 500.0000 mL | INTRAVENOUS | Status: DC

## 2015-12-11 NOTE — Progress Notes (Signed)
Patient awakening,vss,report to rn 

## 2015-12-11 NOTE — Progress Notes (Signed)
Called to room to assist during endoscopic procedure.  Patient ID and intended procedure confirmed with present staff. Received instructions for my participation in the procedure from the performing physician.  

## 2015-12-11 NOTE — Op Note (Signed)
[facility] Patient Name: Natalie Morrison Procedure Date: 12/11/2015 3:33 PM MRN: [id number] Endoscopist: Mauri Pole , MD Age: 45 Referring MD:  Date of Birth: 16-Nov-1970 Gender: Female Procedure:            Upper GI endoscopy Indications:          Dyspepsia, Heartburn Medicines:            Monitored Anesthesia Care Procedure:            Pre-Anesthesia Assessment:                       - Prior to the procedure, a History and Physical was                        performed, and patient medications and allergies were                        reviewed. The patient's tolerance of previous                        anesthesia was also reviewed. The risks and benefits of                        the procedure and the sedation options and risks were                        discussed with the patient. All questions were                        answered, and informed consent was obtained. Prior                        Anticoagulants: The patient has taken no previous                        anticoagulant or antiplatelet agents. ASA Grade                        Assessment: II - A patient with mild systemic disease.                        After reviewing the risks and benefits, the patient was                        deemed in satisfactory condition to undergo the                        procedure.                       After obtaining informed consent, the endoscope was                        passed under direct vision. Throughout the procedure,                        the patient's blood pressure, pulse, and oxygen                        saturations were monitored continuously. The Model  [id number]) scope was introduced through the                        mouth, and advanced to the second part of duodenum. The                        upper GI endoscopy was accomplished without difficulty.                        The patient tolerated the procedure well. Scope  In: Scope Out: Findings:      The esophagus was normal.      The examined duodenum was normal.      Scattered mild inflammation characterized by erythema was found in the       prepyloric region of the stomach. Biopsies were taken with a cold       forceps for Helicobacter pylori testing. Complications:        No immediate complications. Estimated Blood Loss: Estimated blood loss was minimal. Impression:           - Normal esophagus.                       - Normal examined duodenum.                       - Gastritis. Biopsied. Recommendation:       - Patient has a contact number available for                        emergencies. The signs and symptoms of potential                        delayed complications were discussed with the patient.                        Return to normal activities tomorrow. Written discharge                        instructions were provided to the patient.                       - Resume previous diet.                       - Continue present medications.                       - Await pathology results.                       - No repeat upper endoscopy.                       - Return to GI office PRN. Procedure Code(s):    --- Professional ---                       [id number], Esophagogastroduodenoscopy, flexible, transoral;                        with biopsy, single or multiple CPT copyright 2016 [employer]. All rights reserved. Mauri Pole, MD 12/11/2015 3:51:15 PM This report has been  signed electronically. Number of Addenda: 0

## 2015-12-11 NOTE — Patient Instructions (Signed)
Discharge instructions given. Biopsies taken. Resume previous medications. YOU HAD AN ENDOSCOPIC PROCEDURE TODAY AT THE [facility]:   Refer to the procedure report that was given to you for any specific questions about what was found during the examination.  If the procedure report does not answer your questions, please call your gastroenterologist to clarify.  If you requested that your care partner not be given the details of your procedure findings, then the procedure report has been included in a sealed envelope for you to review at your convenience later.  YOU SHOULD EXPECT: Some feelings of bloating in the abdomen. Passage of more gas than usual.  Walking can help get rid of the air that was put into your GI tract during the procedure and reduce the bloating. If you had a lower endoscopy (such as a colonoscopy or flexible sigmoidoscopy) you may notice spotting of blood in your stool or on the toilet paper. If you underwent a bowel prep for your procedure, you may not have a normal bowel movement for a few days.  Please Note:  You might notice some irritation and congestion in your nose or some drainage.  This is from the oxygen used during your procedure.  There is no need for concern and it should clear up in a day or so.  SYMPTOMS TO REPORT IMMEDIATELY:   Following upper endoscopy (EGD)  Vomiting of blood or coffee ground material  New chest pain or pain under the shoulder blades  Painful or persistently difficult swallowing  New shortness of breath  Fever of 100F or higher  Black, tarry-looking stools  For urgent or emergent issues, a gastroenterologist can be reached at any hour by calling [phone].   DIET: Your first meal following the procedure should be a small meal and then it is ok to progress to your normal diet. Heavy or fried foods are harder to digest and may make you feel nauseous or bloated.  Likewise, meals heavy in dairy and vegetables can increase  bloating.  Drink plenty of fluids but you should avoid alcoholic beverages for 24 hours.  ACTIVITY:  You should plan to take it easy for the rest of today and you should NOT DRIVE or use heavy machinery until tomorrow (because of the sedation medicines used during the test).    FOLLOW UP: Our staff will call the number listed on your records the next business day following your procedure to check on you and address any questions or concerns that you may have regarding the information given to you following your procedure. If we do not reach you, we will leave a message.  However, if you are feeling well and you are not experiencing any problems, there is no need to return our call.  We will assume that you have returned to your regular daily activities without incident.  If any biopsies were taken you will be contacted by phone or by letter within the next 1-3 weeks.  Please call us at [phone] if you have not heard about the biopsies in 3 weeks.    SIGNATURES/CONFIDENTIALITY: You and/or your care partner have signed paperwork which will be entered into your electronic medical record.  These signatures attest to the fact that that the information above on your After Visit Summary has been reviewed and is understood.  Full responsibility of the confidentiality of this discharge information lies with you and/or your care-partner. 

## 2015-12-12 ENCOUNTER — Telehealth: Payer: Self-pay | Admitting: *Deleted

## 2015-12-12 NOTE — Telephone Encounter (Signed)
No answer, message left for the patient. 

## 2015-12-18 ENCOUNTER — Encounter: Payer: Self-pay | Admitting: Gastroenterology

## 2015-12-27 ENCOUNTER — Other Ambulatory Visit: Payer: Self-pay | Admitting: Family Medicine

## 2015-12-27 NOTE — Telephone Encounter (Signed)
Pt needs to be scheduled for a follow up next week to check BP.

## 2015-12-27 NOTE — Telephone Encounter (Signed)
Pt states she is no longer taking her bp medicine. They took her off while in the hospital.  Pt states she feels good, except her feet are swelling/ puffy. Pt would like to know if perhaps she needs to be on the bp med? Pt used to take 1/2 tab benazepril-hydrochlorthiazide (LOTENSIN HCT) 20-25 MG per tablet  Pt would like advice.

## 2015-12-31 NOTE — Telephone Encounter (Signed)
Pt has been scheduled.  °

## 2016-01-02 ENCOUNTER — Ambulatory Visit (INDEPENDENT_AMBULATORY_CARE_PROVIDER_SITE_OTHER): Payer: BLUE CROSS/BLUE SHIELD | Admitting: Family Medicine

## 2016-01-02 VITALS — BP 148/100 | HR 96 | Temp 98.5°F | Ht 66.0 in | Wt 132.0 lb

## 2016-01-02 DIAGNOSIS — I10 Essential (primary) hypertension: Secondary | ICD-10-CM | POA: Diagnosis not present

## 2016-01-02 DIAGNOSIS — R600 Localized edema: Secondary | ICD-10-CM

## 2016-01-02 DIAGNOSIS — F1010 Alcohol abuse, uncomplicated: Secondary | ICD-10-CM | POA: Diagnosis not present

## 2016-01-02 NOTE — Progress Notes (Signed)
Subjective:    Patient ID: Natalie Morrison, female    DOB: [birth year], 45 y.o.   MRN: [id number]  HPI  Patient seen for medical follow-up.  Long-standing history of alcohol abuse.  Recent EGD unremarkable.  Unfortunately, she has relapsed into drinking some but she states this is only about one day per week. She is exercising some and states she is eating much better over the past few weeks   Prior history of hypertension and was taken off blood pressure medications because of hypotension couple months ago. Presents today with blood pressure back elevated and increased peripheral edema past few days. No history of heart failure. Denies dyspnea. No orthopnea. Recent albumin 4.5.   Recent abdominal ultrasound showed fatty liver changes but no evidence for cirrhosis or ascites  Past Medical History  Diagnosis Date  . Asthma   . Alcohol abuse   . Anxiety and depression   . Hypertension    Past Surgical History  Procedure Laterality Date  . Breast lumpectomy    . Tonsillectomy and adenoidectomy Bilateral over 30 years ago    reports that she has been smoking Cigarettes.  She has a 12.5 pack-year smoking history. She has never used smokeless tobacco. She reports that she drinks about 1.8 oz of alcohol per week. She reports that she uses illicit drugs (Marijuana) about once per week. family history includes Heart disease in her father and mother; Liver disease in her mother. Allergies  Allergen Reactions  . Nitrofurantoin Hives  . Other Other (See Comments)    Allergies mold, dust per allergy test  . Sulfonamide Derivatives Other (See Comments)    Unknown allergic reaction per husband       Review of Systems  Constitutional: Negative for fever, chills and fatigue.  Eyes: Negative for visual disturbance.  Respiratory: Negative for cough, chest tightness, shortness of breath and wheezing.   Cardiovascular: Positive for leg swelling. Negative for chest pain and palpitations.    Gastrointestinal: Negative for nausea, vomiting and abdominal pain.  Genitourinary: Negative for dysuria.  Neurological: Negative for dizziness, seizures, syncope, weakness, light-headedness and headaches.       Objective:   Physical Exam  Constitutional: She is oriented to person, place, and time. She appears well-developed and well-nourished.  Neck: Neck supple. No thyromegaly present.  Cardiovascular: Normal rate and regular rhythm.   Pulmonary/Chest: Effort normal. No respiratory distress. She has no wheezes. She has no rales.  Abdominal: Soft. Bowel sounds are normal. She exhibits no distension. There is no tenderness. There is no rebound and no guarding.  Musculoskeletal: She exhibits edema.  Patient has 1+ pitting edema lower legs bilaterally  Neurological: She is alert and oriented to person, place, and time. No cranial nerve deficit.  Psychiatric: She has a normal mood and affect. Her behavior is normal.          Assessment & Plan:   #1 hypertension. Currently not treated. Start back benazepril/HCTZ 20/25 mg one half tablet daily.  Bring back to reassess blood pressure one month   #2 bilateral leg edema. Etiology unclear. She's not taking any medications that would cause edema. Denies any dyspnea. Recent albumin 4.5. Start back HCTZ as above. Elevate legs frequently. Follow-up for any worsening edema or other concerns.   Recent TSH normal.  Hx of normal renal function. No known portal hypertension.   #3 alcohol abuse. She has recently relapsed but states she is only drinking about one day per week. We have strongly advised outpatient  or inpatient support program but she declines at this time.  She is aware of seriousness of ongoing ETOH use.

## 2016-01-02 NOTE — Progress Notes (Signed)
Pre visit review using our clinic review tool, if applicable. No additional management support is needed unless otherwise documented below in the visit note. 

## 2016-01-30 ENCOUNTER — Ambulatory Visit: Payer: BLUE CROSS/BLUE SHIELD | Admitting: Family Medicine

## 2016-01-31 ENCOUNTER — Ambulatory Visit (INDEPENDENT_AMBULATORY_CARE_PROVIDER_SITE_OTHER): Payer: BLUE CROSS/BLUE SHIELD | Admitting: Family Medicine

## 2016-01-31 VITALS — BP 120/90 | HR 105 | Temp 98.0°F | Ht 66.0 in | Wt 129.0 lb

## 2016-01-31 DIAGNOSIS — I10 Essential (primary) hypertension: Secondary | ICD-10-CM

## 2016-01-31 DIAGNOSIS — F1010 Alcohol abuse, uncomplicated: Secondary | ICD-10-CM

## 2016-01-31 NOTE — Progress Notes (Signed)
Pre visit review using our clinic review tool, if applicable. No additional management support is needed unless otherwise documented below in the visit note. 

## 2016-01-31 NOTE — Progress Notes (Signed)
   Subjective:    Patient ID: Natalie Morrison, female    DOB: 04-29-1971, 45 y.o.   MRN: [id number]  HPI  Follow-up hypertension  History of alcohol abuse. She has been taking benazepril- HCTZ half a tablet but had hypotensive episode several months ago. Medication was discontinued. Came in last visit with blood pressure 148/100. She also had some peripheral edema. Started back one half tablet of her blood pressure medication above and her edema has resolved. Weight is down 3 pounds.   She is exercising a few days a week and trying to do combination of walking and weight lifting. Good food intake. She is unfortunately occasionally still drinking but she states only one drink couple times a week. She has declined outpatient programs to assist with alcoholism. She is back working full-time  Past Medical History  Diagnosis Date  . Asthma   . Alcohol abuse   . Anxiety and depression   . Hypertension    Past Surgical History  Procedure Laterality Date  . Breast lumpectomy    . Tonsillectomy and adenoidectomy Bilateral over 30 years ago    reports that she has been smoking Cigarettes.  She has a 12.5 pack-year smoking history. She has never used smokeless tobacco. She reports that she drinks about 1.8 oz of alcohol per week. She reports that she uses illicit drugs (Marijuana) about once per week. family history includes Heart disease in her father and mother; Liver disease in her mother. Allergies  Allergen Reactions  . Nitrofurantoin Hives  . Other Other (See Comments)    Allergies mold, dust per allergy test  . Sulfonamide Derivatives Other (See Comments)    Unknown allergic reaction per husband       Review of Systems  Constitutional: Negative for fatigue.  Eyes: Negative for visual disturbance.  Respiratory: Negative for cough, chest tightness, shortness of breath and wheezing.   Cardiovascular: Negative for chest pain, palpitations and leg swelling.  Neurological: Negative for  dizziness, seizures, syncope, weakness, light-headedness and headaches.       Objective:   Physical Exam  Constitutional: She appears well-developed and well-nourished.  Eyes: Pupils are equal, round, and reactive to light.  Neck: Neck supple. No JVD present. No thyromegaly present.  Cardiovascular: Normal rate and regular rhythm.  Exam reveals no gallop.   Pulmonary/Chest: Effort normal and breath sounds normal. No respiratory distress. She has no wheezes. She has no rales.  Musculoskeletal: She exhibits no edema.  Neurological: She is alert.          Assessment & Plan:   #1 hypertension improved. Repeat left arm seated after rest 120/70. Continue low-dose benazepril HCTZ. Check basic metabolic panel with recent initiation of medication   #2 alcohol abuse. We again strongly recommend discontinue alcohol altogether.

## 2016-02-01 LAB — BASIC METABOLIC PANEL
BUN: 16 mg/dL (ref 7–25)
CO2: 25 mmol/L (ref 20–31)
Calcium: 9.7 mg/dL (ref 8.6–10.2)
Chloride: 97 mmol/L — ABNORMAL LOW (ref 98–110)
Creat: 0.76 mg/dL (ref 0.50–1.10)
Glucose, Bld: 105 mg/dL — ABNORMAL HIGH (ref 65–99)
Potassium: 4 mmol/L (ref 3.5–5.3)
Sodium: 137 mmol/L (ref 135–146)

## 2016-03-18 ENCOUNTER — Ambulatory Visit (INDEPENDENT_AMBULATORY_CARE_PROVIDER_SITE_OTHER): Payer: BLUE CROSS/BLUE SHIELD | Admitting: Family Medicine

## 2016-03-18 VITALS — BP 150/90 | HR 111 | Temp 98.2°F | Ht 66.0 in | Wt 122.0 lb

## 2016-03-18 DIAGNOSIS — R319 Hematuria, unspecified: Secondary | ICD-10-CM | POA: Diagnosis not present

## 2016-03-18 DIAGNOSIS — J209 Acute bronchitis, unspecified: Secondary | ICD-10-CM | POA: Diagnosis not present

## 2016-03-18 DIAGNOSIS — R300 Dysuria: Secondary | ICD-10-CM | POA: Diagnosis not present

## 2016-03-18 LAB — POCT URINALYSIS DIPSTICK
Glucose, UA: NEGATIVE
Leukocytes, UA: NEGATIVE
Nitrite, UA: POSITIVE
Spec Grav, UA: >=1.03
Urobilinogen, UA: 2
pH, UA: 5.5

## 2016-03-18 MED ORDER — CIPROFLOXACIN HCL 500 MG PO TABS: 500.0000 mg | ORAL_TABLET | Freq: Two times a day (BID) | ORAL | Status: DC

## 2016-03-18 NOTE — Progress Notes (Signed)
   Subjective:    Patient ID: Natalie Morrison, female    DOB: 1971-05-29, 45 y.o.   MRN: [id number]  HPI Acute visit Patient seen with a few day history of upper respiratory symptoms. She's had sore throat and cough productive of clear sputum. Occasional nasal congestion. Increased malaise. Mild body aches. Denies any fever, nausea, vomiting, or diarrhea.  This morning had minimal gross hematuria. Occasional burning with urination past couple of days. No flank pain. Allergy to sulfa and Macrobid  Past Medical History  Diagnosis Date  . Asthma   . Alcohol abuse   . Anxiety and depression   . Hypertension    Past Surgical History  Procedure Laterality Date  . Breast lumpectomy    . Tonsillectomy and adenoidectomy Bilateral over 30 years ago    reports that she has been smoking Cigarettes.  She has a 12.5 pack-year smoking history. She has never used smokeless tobacco. She reports that she drinks about 1.8 oz of alcohol per week. She reports that she uses illicit drugs (Marijuana) about once per week. family history includes Heart disease in her father and mother; Liver disease in her mother. Allergies  Allergen Reactions  . Nitrofurantoin Hives  . Other Other (See Comments)    Allergies mold, dust per allergy test  . Sulfonamide Derivatives Other (See Comments)    Unknown allergic reaction per husband       Review of Systems  Constitutional: Positive for fatigue. Negative for fever and chills.  HENT: Positive for congestion and sore throat.   Respiratory: Positive for cough.   Genitourinary: Positive for dysuria and hematuria.       Objective:   Physical Exam  Constitutional: She appears well-developed and well-nourished.  HENT:  Right Ear: External ear normal.  Left Ear: External ear normal.  Minimal posterior pharynx erythema. No exudate  Neck: Neck supple.  Cardiovascular: Normal rate and regular rhythm.   Pulmonary/Chest: Effort normal and breath sounds  normal. No respiratory distress. She has no wheezes. She has no rales.  Lymphadenopathy:    She has no cervical adenopathy.          Assessment & Plan:  #1 Viral URI with cough. Treat symptomatically.  No clear indication for antibiotics at this time.  #2 Dysuria. Rule out UTI.  Urine dipstick reveals 2+ ketones, 3+ blood, 3+ protein and positive nitrites. Urine culture sent. Cover empirically with Cipro 500 mg twice daily for 5 days pending results. She is encouraged increased hydration  Eulas Post MD [facility] at [facility]

## 2016-03-18 NOTE — Progress Notes (Signed)
Pre visit review using our clinic review tool, if applicable. No additional management support is needed unless otherwise documented below in the visit note. 

## 2016-03-18 NOTE — Patient Instructions (Signed)

## 2016-03-20 LAB — URINE CULTURE: Colony Count: >=100000

## 2016-09-08 ENCOUNTER — Encounter: Payer: Self-pay | Admitting: Physical Therapy

## 2016-09-08 NOTE — Therapy (Signed)
[facility] [address] [place], [place], [address] Phone: [phone]   Fax:  [phone]  Patient Details  Name: SKARLETTE LATTNER MRN: [id number] Date of Birth: [birth year] Referring Provider:  No ref. provider found  Encounter Date: 09/08/2016  PHYSICAL THERAPY DISCHARGE SUMMARY  Visits from Start of Care: 6  Current functional level related to goals / functional outcomes:     PT Short Term Goals - 10/03/15 1537      PT SHORT TERM GOAL #1   Title Pt will be independent with HEP for improved balance, functional strength and gait.  TARGET 11/02/15   Time 4   Period Weeks   Status New     PT SHORT TERM GOAL #2   Title Pt will improve TUG score to less than or equal to 13.5 seconds for decreased fall risk.   Time 4   Period Weeks   Status New     PT SHORT TERM GOAL #3   Title Pt will improve Dynamic Gait Index score to at least 12/24 for decreased fall risk.   Time 4   Period Weeks   Status New     PT SHORT TERM GOAL #4   Title Pt will ambulate at least 500 ft, indoor/outdoor surfaces, modified independetly, for improved transition to long distance walking to workplace.   Time 4   Period Weeks   Status New     PT SHORT TERM GOAL #5   Title Pt will verbalize understanding of fall prevention in the home environment.   Time 4   Period Weeks   Status New    Short and long term goals not able to be fully assessed, due to patient not returning to PT after 10/22/15 visit.   Remaining deficits: See eval-balance, strength   Education / Equipment: Initiated HEP, unable to fully address due to patient not returning after 10/22/15 visit.  Plan: Patient agrees to discharge.  Patient goals were not met. Patient is being discharged due to meeting the stated rehab goals.  ?????      MARRIOTT,AMY W. 09/08/2016, 2:07 PM [facility] [address] [facility] [address] [place], [place], [address] Phone: [phone]   Fax:  [phone]

## 2016-10-26 ENCOUNTER — Ambulatory Visit (INDEPENDENT_AMBULATORY_CARE_PROVIDER_SITE_OTHER): Payer: BLUE CROSS/BLUE SHIELD | Admitting: Family Medicine

## 2016-10-26 ENCOUNTER — Encounter: Payer: Self-pay | Admitting: Family Medicine

## 2016-10-26 VITALS — BP 100/72 | HR 115 | Temp 97.7°F | Ht 66.0 in | Wt 127.0 lb

## 2016-10-26 DIAGNOSIS — R059 Cough, unspecified: Secondary | ICD-10-CM

## 2016-10-26 DIAGNOSIS — R05 Cough: Secondary | ICD-10-CM | POA: Diagnosis not present

## 2016-10-26 NOTE — Progress Notes (Signed)
Subjective:     Patient ID: Natalie Morrison, female   DOB: 10-10-1970, 46 y.o.   MRN: [id number]  HPI Patient seen with acute illness. Onset last Wednesday. She thinks she had some fever then along with cough, postnasal drip, nausea without vomiting, and body aches. Overall, slightly improved in terms of fever resolved and nausea better. No diarrhea. Cough productive of thick green sputum. Patient does still smoke about 5 cigarettes per day. She has history of alcohol abuse. She states she has scaled back greatly but not abstinent.  No dyspnea. No wheezing  Past Medical History:  Diagnosis Date  . Alcohol abuse   . Anxiety and depression   . Asthma   . Hypertension    Past Surgical History:  Procedure Laterality Date  . BREAST LUMPECTOMY    . TONSILLECTOMY AND ADENOIDECTOMY Bilateral over 30 years ago    reports that she has been smoking Cigarettes.  She has a 12.50 pack-year smoking history. She has never used smokeless tobacco. She reports that she drinks about 1.8 oz of alcohol per week . She reports that she uses drugs, including Marijuana, about 1 time per week. family history includes Heart disease in her father and mother; Liver disease in her mother. Allergies  Allergen Reactions  . Nitrofurantoin Hives  . Other Other (See Comments)    Allergies mold, dust per allergy test  . Sulfonamide Derivatives Other (See Comments)    Unknown allergic reaction per husband      Review of Systems  Constitutional: Negative for chills and fever.  HENT: Positive for congestion. Negative for sore throat.   Respiratory: Positive for cough. Negative for shortness of breath and wheezing.   Cardiovascular: Negative for chest pain.       Objective:   Physical Exam  Constitutional: She appears well-developed and well-nourished.  HENT:  Right Ear: External ear normal.  Left Ear: External ear normal.  Mild posterior erythema without exudate  Neck: Neck supple.  Cardiovascular: Normal rate  and regular rhythm.   Pulmonary/Chest: Effort normal and breath sounds normal. No respiratory distress. She has no wheezes. She has no rales.       Assessment:     Productive cough. Suspect recent viral illness. Patient does have high risk in terms of her history alcohol abuse and ongoing nicotine use    Plan:     -We began recommended total abstinence from alcohol -Patient has leftover Zithromax which was prescribed by someone else last fall which she's not taken. She will start that and follow-up promptly for any fever or worsening symptoms  Eulas Post MD [facility] at [facility]

## 2016-10-26 NOTE — Progress Notes (Signed)
Pre visit review using our clinic review tool, if applicable. No additional management support is needed unless otherwise documented below in the visit note. 

## 2016-10-26 NOTE — Patient Instructions (Signed)
Go ahead and take the Zithromax (should be for 5 days) Stay well hydrated Follow up for any recurrent fever, increased shortness of breath, or other concerns.

## 2016-10-28 ENCOUNTER — Telehealth: Payer: Self-pay | Admitting: Family Medicine

## 2016-10-28 NOTE — Telephone Encounter (Signed)
° ° ° °  Pt call today to say she was here earlier this week because of the flu. She said now she feels worse and does not know what to do would like a call back

## 2016-10-29 ENCOUNTER — Encounter (HOSPITAL_COMMUNITY): Payer: Self-pay | Admitting: Emergency Medicine

## 2016-10-29 ENCOUNTER — Ambulatory Visit (HOSPITAL_COMMUNITY)
Admission: EM | Admit: 2016-10-29 | Discharge: 2016-10-29 | Disposition: A | Discharge status: Home / Self Care | Payer: BLUE CROSS/BLUE SHIELD | Attending: Family Medicine | Admitting: Family Medicine

## 2016-10-29 DIAGNOSIS — B349 Viral infection, unspecified: Secondary | ICD-10-CM

## 2016-10-29 DIAGNOSIS — J40 Bronchitis, not specified as acute or chronic: Secondary | ICD-10-CM

## 2016-10-29 DIAGNOSIS — R05 Cough: Secondary | ICD-10-CM | POA: Diagnosis not present

## 2016-10-29 DIAGNOSIS — R110 Nausea: Secondary | ICD-10-CM | POA: Diagnosis not present

## 2016-10-29 DIAGNOSIS — R059 Cough, unspecified: Secondary | ICD-10-CM

## 2016-10-29 MED ORDER — METHYLPREDNISOLONE 4 MG PO TBPK: ORAL_TABLET | ORAL | 0 refills | Status: DC

## 2016-10-29 MED ORDER — AZITHROMYCIN 250 MG PO TABS: 250.0000 mg | ORAL_TABLET | Freq: Every day | ORAL | 0 refills | Status: DC

## 2016-10-29 MED ORDER — ONDANSETRON 4 MG PO TBDP: 4.0000 mg | ORAL_TABLET | Freq: Three times a day (TID) | ORAL | 0 refills | Status: DC | PRN

## 2016-10-29 MED ORDER — ONDANSETRON 4 MG PO TBDP
4.0000 mg | ORAL_TABLET | Freq: Once | ORAL | Status: AC
  Administered 2016-10-29: 4 mg via ORAL

## 2016-10-29 MED ORDER — ONDANSETRON 4 MG PO TBDP
ORAL_TABLET | ORAL | Status: AC
  Filled 2016-10-29: qty 1

## 2016-10-29 NOTE — ED Triage Notes (Signed)
Pt c/o cold sx onset: yest  Sx include: prod cough, vomiting, dehydration, nauseas, BA, dizziness  Saw PCP on Monday and was treated for bronchitis/flu  Taking: Z-pack    A&O x4... NAD

## 2016-10-29 NOTE — ED Provider Notes (Signed)
CSN: [id number]     Arrival date & time 10/29/16  1116 History   First MD Initiated Contact with Patient 10/29/16 1316     Chief Complaint  Patient presents with  . URI   (Consider location/radiation/quality/duration/timing/severity/associated sxs/prior Treatment) Patient c/o nausea and vomiting over last 36 hours.  Patient has had URI or Flu for 9 days.  She recently has been treated with abx's zithromax and didn't get to finish this because she was vomiting.     The history is provided by the patient and the spouse.  URI  Presenting symptoms: congestion, cough, fatigue and fever   Severity:  Moderate Onset quality:  Sudden Duration:  9 days Timing:  Constant Progression:  Unchanged Chronicity:  New Relieved by:  Nothing Worsened by:  Nothing Ineffective treatments:  None tried   Past Medical History:  Diagnosis Date  . Alcohol abuse   . Anxiety and depression   . Asthma   . Hypertension    Past Surgical History:  Procedure Laterality Date  . BREAST LUMPECTOMY    . TONSILLECTOMY AND ADENOIDECTOMY Bilateral over 30 years ago   Family History  Problem Relation Age of Onset  . Heart disease Mother   . Liver disease Mother   . Heart disease Father    Social History  Substance Use Topics  . Smoking status: Current Every Day Smoker    Packs/day: 0.50    Years: 25.00    Types: Cigarettes  . Smokeless tobacco: Never Used  . Alcohol use 1.8 oz/week    3 Shots of liquor per week   OB History    No data available     Review of Systems  Constitutional: Positive for fatigue and fever.  HENT: Positive for congestion.   Eyes: Negative.   Respiratory: Positive for cough.   Cardiovascular: Negative.   Gastrointestinal: Negative.   Endocrine: Negative.   Musculoskeletal: Negative.   Allergic/Immunologic: Negative.   Neurological: Negative.   Hematological: Negative.   Psychiatric/Behavioral: Negative.     Allergies  Nitrofurantoin; Other; and Sulfonamide  derivatives  Home Medications   Prior to Admission medications   Medication Sig Start Date End Date Taking? Authorizing Provider  benazepril-hydrochlorthiazide (LOTENSIN HCT) 20-25 MG tablet Take 0.5 tablets by mouth daily. Take one half tablet daily   Yes Historical Provider, MD  cetirizine (ZYRTEC) 10 MG chewable tablet Chew 10 mg by mouth at bedtime.    Yes Historical Provider, MD  Cyanocobalamin (VITAMIN B-12 PO) Take 1 tablet by mouth daily.   Yes Historical Provider, MD  fluticasone (FLONASE) 50 MCG/ACT nasal spray Place 1 spray into both nostrils daily.   Yes Historical Provider, MD  GLUCOSAMINE-CHONDROITIN PO Take 1 tablet by mouth daily.   Yes Historical Provider, MD  Multiple Vitamin (MULTIVITAMIN WITH MINERALS) TABS tablet Take 1 tablet by mouth daily. One a Day Women's   Yes Historical Provider, MD  thiamine 100 MG tablet Take 1 tablet (100 mg total) by mouth daily. 09/24/15  Yes Ripudeep Krystal Eaton, MD  albuterol (PROVENTIL,VENTOLIN) 90 MCG/ACT inhaler Inhale 2 puffs into the lungs every 6 (six) hours as needed for wheezing or shortness of breath. Reported on 01/02/2016    Historical Provider, MD  arformoterol (BROVANA) 15 MCG/2ML NEBU Take 2 mLs (15 mcg total) by nebulization 2 (two) times daily. 09/24/15   Ripudeep Krystal Eaton, MD  azithromycin (ZITHROMAX) 250 MG tablet Take 1 tablet (250 mg total) by mouth daily. Take first 2 tablets together, then 1 every day until  finished. 10/29/16   Lysbeth Penner, FNP  budesonide (PULMICORT) 0.25 MG/2ML nebulizer solution Take 2 mLs (0.25 mg total) by nebulization 2 (two) times daily. 09/24/15   Ripudeep Krystal Eaton, MD  methylPREDNISolone (MEDROL DOSEPAK) 4 MG TBPK tablet Take 6-5-4-3-2-1 po qd 10/29/16   Lysbeth Penner, FNP  ondansetron (ZOFRAN ODT) 4 MG disintegrating tablet Take 1 tablet (4 mg total) by mouth every 8 (eight) hours as needed for nausea or vomiting. 10/29/16   Lysbeth Penner, FNP  pantoprazole (PROTONIX) 40 MG tablet Take 1 tablet (40 mg  total) by mouth 2 (two) times daily. 11/05/15   Eulas Post, MD   Meds Ordered and Administered this Visit   Medications  ondansetron (ZOFRAN-ODT) disintegrating tablet 4 mg (4 mg Oral Given 10/29/16 1332)    BP 131/88 (BP Location: Left Arm)   Pulse 106   Temp 98.2 F (36.8 C) (Oral)   Resp 18   SpO2 100%  No data found.   Physical Exam  Constitutional: She appears well-developed and well-nourished.  HENT:  Head: Normocephalic and atraumatic.  Right Ear: External ear normal.  Left Ear: External ear normal.  Mouth/Throat: Oropharynx is clear and moist.  Eyes: Conjunctivae and EOM are normal. Pupils are equal, round, and reactive to light.  Neck: Normal range of motion. Neck supple.  Cardiovascular: Normal rate, regular rhythm and normal heart sounds.   Pulmonary/Chest: Effort normal and breath sounds normal.  Abdominal: Soft. Bowel sounds are normal.  Nursing note and vitals reviewed.   Urgent Care Course     Procedures (including critical care time)  Labs Review Labs Reviewed - No data to display  Imaging Review No results found.   Visual Acuity Review  Right Eye Distance:   Left Eye Distance:   Bilateral Distance:    Right Eye Near:   Left Eye Near:    Bilateral Near:         MDM   1. Bronchitis   2. Nausea   3. Viral syndrome   4. Cough    Zofran odt 4mg  now Zofran odt 4mg  one po tid prn #21 Zpak Medrol dose pack as directed 4mg  #21 Push po fluids, rest, tylenol and motrin otc prn as directed for fever, arthralgias, and myalgias.  Follow up prn if sx's continue or persist.    Lysbeth Penner, FNP 10/29/16 1340

## 2016-10-30 ENCOUNTER — Inpatient Hospital Stay (HOSPITAL_COMMUNITY)
Admission: EM | Admit: 2016-10-30 | Discharge: 2016-11-02 | DRG: 439 | Disposition: A | Discharge status: Home / Self Care | Payer: BLUE CROSS/BLUE SHIELD | Attending: Internal Medicine | Admitting: Internal Medicine

## 2016-10-30 ENCOUNTER — Inpatient Hospital Stay (HOSPITAL_COMMUNITY): Payer: BLUE CROSS/BLUE SHIELD

## 2016-10-30 ENCOUNTER — Encounter (HOSPITAL_COMMUNITY): Payer: Self-pay | Admitting: *Deleted

## 2016-10-30 DIAGNOSIS — I10 Essential (primary) hypertension: Secondary | ICD-10-CM | POA: Diagnosis present

## 2016-10-30 DIAGNOSIS — K852 Alcohol induced acute pancreatitis without necrosis or infection: Secondary | ICD-10-CM | POA: Diagnosis not present

## 2016-10-30 DIAGNOSIS — Z720 Tobacco use: Secondary | ICD-10-CM

## 2016-10-30 DIAGNOSIS — E860 Dehydration: Secondary | ICD-10-CM | POA: Diagnosis present

## 2016-10-30 DIAGNOSIS — Z79899 Other long term (current) drug therapy: Secondary | ICD-10-CM | POA: Diagnosis not present

## 2016-10-30 DIAGNOSIS — F10239 Alcohol dependence with withdrawal, unspecified: Secondary | ICD-10-CM | POA: Diagnosis present

## 2016-10-30 DIAGNOSIS — R748 Abnormal levels of other serum enzymes: Secondary | ICD-10-CM

## 2016-10-30 DIAGNOSIS — Z7951 Long term (current) use of inhaled steroids: Secondary | ICD-10-CM | POA: Diagnosis not present

## 2016-10-30 DIAGNOSIS — R112 Nausea with vomiting, unspecified: Secondary | ICD-10-CM | POA: Diagnosis present

## 2016-10-30 DIAGNOSIS — Z8659 Personal history of other mental and behavioral disorders: Secondary | ICD-10-CM

## 2016-10-30 DIAGNOSIS — Z882 Allergy status to sulfonamides status: Secondary | ICD-10-CM

## 2016-10-30 DIAGNOSIS — F1010 Alcohol abuse, uncomplicated: Secondary | ICD-10-CM | POA: Diagnosis present

## 2016-10-30 DIAGNOSIS — E876 Hypokalemia: Secondary | ICD-10-CM

## 2016-10-30 DIAGNOSIS — K859 Acute pancreatitis without necrosis or infection, unspecified: Principal | ICD-10-CM | POA: Diagnosis present

## 2016-10-30 DIAGNOSIS — E872 Acidosis: Secondary | ICD-10-CM | POA: Diagnosis present

## 2016-10-30 DIAGNOSIS — Z888 Allergy status to other drugs, medicaments and biological substances status: Secondary | ICD-10-CM

## 2016-10-30 DIAGNOSIS — Z8249 Family history of ischemic heart disease and other diseases of the circulatory system: Secondary | ICD-10-CM

## 2016-10-30 DIAGNOSIS — F17210 Nicotine dependence, cigarettes, uncomplicated: Secondary | ICD-10-CM | POA: Diagnosis present

## 2016-10-30 DIAGNOSIS — J069 Acute upper respiratory infection, unspecified: Secondary | ICD-10-CM | POA: Diagnosis present

## 2016-10-30 DIAGNOSIS — E871 Hypo-osmolality and hyponatremia: Secondary | ICD-10-CM

## 2016-10-30 DIAGNOSIS — R2689 Other abnormalities of gait and mobility: Secondary | ICD-10-CM

## 2016-10-30 DIAGNOSIS — N179 Acute kidney failure, unspecified: Secondary | ICD-10-CM | POA: Diagnosis present

## 2016-10-30 DIAGNOSIS — J45909 Unspecified asthma, uncomplicated: Secondary | ICD-10-CM | POA: Diagnosis present

## 2016-10-30 HISTORY — DX: Anxiety disorder, unspecified: F41.9

## 2016-10-30 HISTORY — DX: Depression, unspecified: F32.A

## 2016-10-30 HISTORY — DX: Major depressive disorder, single episode, unspecified: F32.9

## 2016-10-30 HISTORY — DX: Anemia, unspecified: D64.9

## 2016-10-30 HISTORY — DX: Hepatitis a without hepatic coma: B15.9

## 2016-10-30 LAB — COMPREHENSIVE METABOLIC PANEL
ALT: 123 U/L — ABNORMAL HIGH (ref 14–54)
AST: 87 U/L — ABNORMAL HIGH (ref 15–41)
Albumin: 5.2 g/dL — ABNORMAL HIGH (ref 3.5–5.0)
Alkaline Phosphatase: 51 U/L (ref 38–126)
Anion gap: 27 — ABNORMAL HIGH (ref 5–15)
BUN: 20 mg/dL (ref 6–20)
CO2: 12 mmol/L — ABNORMAL LOW (ref 22–32)
Calcium: 9.8 mg/dL (ref 8.9–10.3)
Chloride: 91 mmol/L — ABNORMAL LOW (ref 101–111)
Creatinine, Ser: 1.38 mg/dL — ABNORMAL HIGH (ref 0.44–1.00)
GFR calc Af Amer: 52 mL/min — ABNORMAL LOW (ref >60)
GFR calc non Af Amer: 45 mL/min — ABNORMAL LOW (ref >60)
Glucose, Bld: 194 mg/dL — ABNORMAL HIGH (ref 65–99)
Potassium: 3.1 mmol/L — ABNORMAL LOW (ref 3.5–5.1)
Sodium: 130 mmol/L — ABNORMAL LOW (ref 135–145)
Total Bilirubin: 1.9 mg/dL — ABNORMAL HIGH (ref 0.3–1.2)
Total Protein: 7.9 g/dL (ref 6.5–8.1)

## 2016-10-30 LAB — INFLUENZA PANEL BY PCR (TYPE A & B)
Influenza A By PCR: NEGATIVE
Influenza B By PCR: NEGATIVE

## 2016-10-30 LAB — I-STAT VENOUS BLOOD GAS, ED
Acid-base deficit: 11 mmol/L — ABNORMAL HIGH (ref 0.0–2.0)
Bicarbonate: 13.8 mmol/L — ABNORMAL LOW (ref 20.0–28.0)
O2 Saturation: 69 %
TCO2: 15 mmol/L (ref 0–100)
pCO2, Ven: 27.2 mmHg — ABNORMAL LOW (ref 44.0–60.0)
pH, Ven: 7.313 (ref 7.250–7.430)
pO2, Ven: 38 mmHg (ref 32.0–45.0)

## 2016-10-30 LAB — CBC
HCT: 44.9 % (ref 36.0–46.0)
Hemoglobin: 16.4 g/dL — ABNORMAL HIGH (ref 12.0–15.0)
MCH: 38.2 pg — ABNORMAL HIGH (ref 26.0–34.0)
MCHC: 36.5 g/dL — ABNORMAL HIGH (ref 30.0–36.0)
MCV: 104.7 fL — ABNORMAL HIGH (ref 78.0–100.0)
Platelets: 153 10*3/uL (ref 150–400)
RBC: 4.29 MIL/uL (ref 3.87–5.11)
RDW: 11.7 % (ref 11.5–15.5)
WBC: 14.9 10*3/uL — ABNORMAL HIGH (ref 4.0–10.5)

## 2016-10-30 LAB — I-STAT CG4 LACTIC ACID, ED: Lactic Acid, Venous: 2.36 mmol/L (ref 0.5–1.9)

## 2016-10-30 LAB — LIPASE, BLOOD: Lipase: 1750 U/L — ABNORMAL HIGH (ref 11–51)

## 2016-10-30 LAB — ETHANOL: Alcohol, Ethyl (B): <5 mg/dL (ref <5)

## 2016-10-30 MED ORDER — NICOTINE 14 MG/24HR TD PT24
14.0000 mg | MEDICATED_PATCH | TRANSDERMAL | Status: DC
  Administered 2016-10-30 – 2016-11-01 (×4): 14 mg via TRANSDERMAL
  Filled 2016-10-30 (×3): qty 1

## 2016-10-30 MED ORDER — STERILE WATER FOR INJECTION IV SOLN
INTRAVENOUS | Status: DC
  Administered 2016-10-30 (×2): via INTRAVENOUS
  Filled 2016-10-30 (×3): qty 850

## 2016-10-30 MED ORDER — FOLIC ACID 1 MG PO TABS: 1.0000 mg | ORAL_TABLET | Freq: Every day | ORAL | Status: DC

## 2016-10-30 MED ORDER — FLUTICASONE PROPIONATE 50 MCG/ACT NA SUSP
1.0000 | Freq: Every day | NASAL | Status: DC
  Administered 2016-10-31 – 2016-11-02 (×3): 1 via NASAL
  Filled 2016-10-30 (×2): qty 16

## 2016-10-30 MED ORDER — PANTOPRAZOLE SODIUM 40 MG PO TBEC
40.0000 mg | DELAYED_RELEASE_TABLET | Freq: Two times a day (BID) | ORAL | Status: DC
  Administered 2016-10-30 – 2016-11-02 (×6): 40 mg via ORAL
  Filled 2016-10-30 (×6): qty 1

## 2016-10-30 MED ORDER — ALBUTEROL SULFATE (2.5 MG/3ML) 0.083% IN NEBU: 2.5000 mg | INHALATION_SOLUTION | Freq: Four times a day (QID) | RESPIRATORY_TRACT | Status: DC | PRN

## 2016-10-30 MED ORDER — FENTANYL CITRATE (PF) 100 MCG/2ML IJ SOLN
25.0000 ug | Freq: Once | INTRAMUSCULAR | Status: AC
  Administered 2016-10-30: 25 ug via INTRAVENOUS
  Filled 2016-10-30: qty 2

## 2016-10-30 MED ORDER — FOLIC ACID 1 MG PO TABS
1.0000 mg | ORAL_TABLET | Freq: Every day | ORAL | Status: DC
  Administered 2016-10-31 – 2016-11-02 (×3): 1 mg via ORAL
  Filled 2016-10-30 (×3): qty 1

## 2016-10-30 MED ORDER — ONDANSETRON HCL 4 MG/2ML IJ SOLN
4.0000 mg | Freq: Four times a day (QID) | INTRAMUSCULAR | Status: DC | PRN
  Administered 2016-10-31: 4 mg via INTRAVENOUS
  Filled 2016-10-30 (×3): qty 2

## 2016-10-30 MED ORDER — ADULT MULTIVITAMIN W/MINERALS CH: 1.0000 | ORAL_TABLET | Freq: Every day | ORAL | Status: DC

## 2016-10-30 MED ORDER — SODIUM CHLORIDE 0.9 % IV SOLN
30.0000 meq | Freq: Once | INTRAVENOUS | Status: AC
  Administered 2016-10-30: 30 meq via INTRAVENOUS
  Filled 2016-10-30: qty 15

## 2016-10-30 MED ORDER — HYDRALAZINE HCL 20 MG/ML IJ SOLN: 10.0000 mg | Freq: Four times a day (QID) | INTRAMUSCULAR | Status: DC | PRN

## 2016-10-30 MED ORDER — MORPHINE SULFATE (PF) 2 MG/ML IV SOLN
2.0000 mg | INTRAVENOUS | Status: DC | PRN
  Administered 2016-10-31: 2 mg via INTRAVENOUS
  Filled 2016-10-30: qty 1

## 2016-10-30 MED ORDER — THIAMINE HCL 100 MG/ML IJ SOLN: 100.0000 mg | Freq: Every day | INTRAMUSCULAR | Status: DC

## 2016-10-30 MED ORDER — THIAMINE HCL 100 MG/ML IJ SOLN
100.0000 mg | Freq: Every day | INTRAMUSCULAR | Status: DC
  Filled 2016-10-30: qty 2

## 2016-10-30 MED ORDER — BUDESONIDE 0.25 MG/2ML IN SUSP
0.2500 mg | Freq: Two times a day (BID) | RESPIRATORY_TRACT | Status: DC
  Administered 2016-10-31 – 2016-11-02 (×5): 0.25 mg via RESPIRATORY_TRACT
  Filled 2016-10-30 (×5): qty 2

## 2016-10-30 MED ORDER — SODIUM CHLORIDE 0.9% FLUSH
3.0000 mL | Freq: Two times a day (BID) | INTRAVENOUS | Status: DC
  Administered 2016-10-31 – 2016-11-01 (×4): 3 mL via INTRAVENOUS

## 2016-10-30 MED ORDER — ADULT MULTIVITAMIN W/MINERALS CH
1.0000 | ORAL_TABLET | Freq: Every day | ORAL | Status: DC
  Administered 2016-10-31 – 2016-11-02 (×3): 1 via ORAL
  Filled 2016-10-30 (×3): qty 1

## 2016-10-30 MED ORDER — VITAMIN B-1 100 MG PO TABS
100.0000 mg | ORAL_TABLET | Freq: Every day | ORAL | Status: DC
  Administered 2016-10-31 – 2016-11-02 (×3): 100 mg via ORAL
  Filled 2016-10-30 (×3): qty 1

## 2016-10-30 MED ORDER — ONDANSETRON HCL 4 MG PO TABS: 4.0000 mg | ORAL_TABLET | Freq: Four times a day (QID) | ORAL | Status: DC | PRN

## 2016-10-30 MED ORDER — LORAZEPAM 1 MG PO TABS
1.0000 mg | ORAL_TABLET | Freq: Four times a day (QID) | ORAL | Status: DC | PRN
  Administered 2016-10-31 – 2016-11-02 (×5): 1 mg via ORAL
  Filled 2016-10-30 (×5): qty 1

## 2016-10-30 MED ORDER — LORAZEPAM 2 MG/ML IJ SOLN
0.0000 mg | Freq: Four times a day (QID) | INTRAMUSCULAR | Status: AC
  Administered 2016-10-30: 2 mg via INTRAVENOUS
  Administered 2016-10-31 (×3): 1 mg via INTRAVENOUS
  Filled 2016-10-30 (×3): qty 1

## 2016-10-30 MED ORDER — LORAZEPAM 1 MG PO TABS: 1.0000 mg | ORAL_TABLET | Freq: Once | ORAL | Status: DC

## 2016-10-30 MED ORDER — THIAMINE HCL 100 MG/ML IJ SOLN
Freq: Once | INTRAVENOUS | Status: AC
  Administered 2016-10-31: 03:00:00 via INTRAVENOUS
  Filled 2016-10-30: qty 1000

## 2016-10-30 MED ORDER — SODIUM CHLORIDE 0.9 % IV BOLUS (SEPSIS)
2000.0000 mL | Freq: Once | INTRAVENOUS | Status: AC
  Administered 2016-10-30: 2000 mL via INTRAVENOUS

## 2016-10-30 MED ORDER — VITAMIN B-1 100 MG PO TABS: 100.0000 mg | ORAL_TABLET | Freq: Every day | ORAL | Status: DC

## 2016-10-30 MED ORDER — LORAZEPAM 2 MG/ML IJ SOLN
1.0000 mg | Freq: Four times a day (QID) | INTRAMUSCULAR | Status: DC | PRN
  Filled 2016-10-30: qty 1

## 2016-10-30 MED ORDER — LORAZEPAM 2 MG/ML IJ SOLN
0.0000 mg | Freq: Two times a day (BID) | INTRAMUSCULAR | Status: DC
  Filled 2016-10-30: qty 1

## 2016-10-30 MED ORDER — ENOXAPARIN SODIUM 40 MG/0.4ML ~~LOC~~ SOLN
40.0000 mg | SUBCUTANEOUS | Status: DC
  Administered 2016-10-30 – 2016-11-01 (×3): 40 mg via SUBCUTANEOUS
  Filled 2016-10-30 (×3): qty 0.4

## 2016-10-30 MED ORDER — ALBUTEROL 90 MCG/ACT IN AERS: 2.0000 | INHALATION_SPRAY | Freq: Four times a day (QID) | RESPIRATORY_TRACT | Status: DC | PRN

## 2016-10-30 MED ORDER — LORAZEPAM 2 MG/ML IJ SOLN
1.0000 mg | Freq: Once | INTRAMUSCULAR | Status: AC
  Administered 2016-10-30: 1 mg via INTRAVENOUS
  Filled 2016-10-30: qty 1

## 2016-10-30 NOTE — Telephone Encounter (Signed)
FYI: Pt is currently in the ER with abdominal sx.

## 2016-10-30 NOTE — ED Provider Notes (Signed)
[facility] DEPT Provider Note   CSN: [id number] Arrival date & time: 10/30/16  1145     History   Chief Complaint Chief Complaint  Patient presents with  . Influenza  . Emesis  . Weakness    HPI Natalie Morrison is a 46 y.o. female.  HPI    Patient is a 46 year old female presenting with nausea vomiting. Patient reports that she had symptoms of the flu last week. Her PCP gave her a Z-Pak starting on Monday. Patient had increasing nausea vomiting. She had one episode of diarrhea.  Patient very evasive about her alcohol use. She reports she has not been drinking as much and she's been ill.  According to prior notes appears that she has had alcohol abuse in the past. Patient reports her last drink was last night and patient fairly termulous  on exam.  Past Medical History:  Diagnosis Date  . Alcohol abuse   . Anxiety and depression   . Asthma   . Hypertension     Patient Active Problem List   Diagnosis Date Noted  . Moderate malnutrition ([place]) 12/03/2015  . Hypotension 09/23/2015  . Acute kidney injury ([place]) 09/23/2015  . Septic shock ([place]) 09/21/2015  . History of depression 06/14/2015  . Asthma exacerbation 04/07/2011  . HEMATURIA UNSPECIFIED 07/25/2009  . Alcohol abuse 06/26/2009  . ALLERGIC RHINITIS 06/26/2009  . FATIGUE 06/26/2009  . Essential hypertension 06/28/2007    Past Surgical History:  Procedure Laterality Date  . BREAST LUMPECTOMY    . TONSILLECTOMY AND ADENOIDECTOMY Bilateral over 30 years ago    OB History    No data available       Home Medications    Prior to Admission medications   Medication Sig Start Date End Date Taking? Authorizing Provider  albuterol (PROVENTIL,VENTOLIN) 90 MCG/ACT inhaler Inhale 2 puffs into the lungs every 6 (six) hours as needed for wheezing or shortness of breath. Reported on 01/02/2016    Historical Provider, MD  arformoterol (BROVANA) 15 MCG/2ML NEBU Take 2 mLs (15 mcg total) by nebulization 2 (two)  times daily. 09/24/15   Ripudeep Krystal Eaton, MD  azithromycin (ZITHROMAX) 250 MG tablet Take 1 tablet (250 mg total) by mouth daily. Take first 2 tablets together, then 1 every day until finished. 10/29/16   Lysbeth Penner, FNP  benazepril-hydrochlorthiazide (LOTENSIN HCT) 20-25 MG tablet Take 0.5 tablets by mouth daily. Take one half tablet daily    Historical Provider, MD  budesonide (PULMICORT) 0.25 MG/2ML nebulizer solution Take 2 mLs (0.25 mg total) by nebulization 2 (two) times daily. 09/24/15   Ripudeep Krystal Eaton, MD  cetirizine (ZYRTEC) 10 MG chewable tablet Chew 10 mg by mouth at bedtime.     Historical Provider, MD  Cyanocobalamin (VITAMIN B-12 PO) Take 1 tablet by mouth daily.    Historical Provider, MD  fluticasone (FLONASE) 50 MCG/ACT nasal spray Place 1 spray into both nostrils daily.    Historical Provider, MD  GLUCOSAMINE-CHONDROITIN PO Take 1 tablet by mouth daily.    Historical Provider, MD  methylPREDNISolone (MEDROL DOSEPAK) 4 MG TBPK tablet Take 6-5-4-3-2-1 po qd 10/29/16   Lysbeth Penner, FNP  Multiple Vitamin (MULTIVITAMIN WITH MINERALS) TABS tablet Take 1 tablet by mouth daily. One a Day Women's    Historical Provider, MD  ondansetron (ZOFRAN ODT) 4 MG disintegrating tablet Take 1 tablet (4 mg total) by mouth every 8 (eight) hours as needed for nausea or vomiting. 10/29/16   Lysbeth Penner, FNP  pantoprazole (PROTONIX)  40 MG tablet Take 1 tablet (40 mg total) by mouth 2 (two) times daily. 11/05/15   Eulas Post, MD  thiamine 100 MG tablet Take 1 tablet (100 mg total) by mouth daily. 09/24/15   Ripudeep Krystal Eaton, MD    Family History Family History  Problem Relation Age of Onset  . Heart disease Mother   . Liver disease Mother   . Heart disease Father     Social History Social History  Substance Use Topics  . Smoking status: Current Every Day Smoker    Packs/day: 0.50    Years: 25.00    Types: Cigarettes  . Smokeless tobacco: Never Used  . Alcohol use 1.8 oz/week     3 Shots of liquor per week     Allergies   Nitrofurantoin; Other; and Sulfonamide derivatives   Review of Systems Review of Systems  Constitutional: Positive for fatigue. Negative for diaphoresis and fever.  Gastrointestinal: Positive for abdominal pain, diarrhea, nausea and vomiting. Negative for constipation.  All other systems reviewed and are negative.    Physical Exam Updated Vital Signs BP (!) 174/103   Pulse 94   Temp 97.6 F (36.4 C) (Oral)   Resp 17   SpO2 100%   Physical Exam  Constitutional: She is oriented to person, place, and time. She appears well-developed and well-nourished.  Very thin tremulous female.  HENT:  Head: Normocephalic and atraumatic.  Eyes: Right eye exhibits no discharge. Left eye exhibits no discharge.  Cardiovascular: Normal rate and regular rhythm.   Pulmonary/Chest: Effort normal and breath sounds normal.  Abdominal: There is tenderness.  Tenderness to left upper quadrant.  Neurological: She is oriented to person, place, and time.  Skin: Skin is warm and dry. She is not diaphoretic.  Psychiatric: She has a normal mood and affect.  Nursing note and vitals reviewed.    ED Treatments / Results  Labs (all labs ordered are listed, but only abnormal results are displayed) Labs Reviewed  LIPASE, BLOOD - Abnormal; Notable for the following:       Result Value   Lipase 1,750 (*)    All other components within normal limits  COMPREHENSIVE METABOLIC PANEL - Abnormal; Notable for the following:    Sodium 130 (*)    Potassium 3.1 (*)    Chloride 91 (*)    CO2 12 (*)    Glucose, Bld 194 (*)    Creatinine, Ser 1.38 (*)    Albumin 5.2 (*)    AST 87 (*)    ALT 123 (*)    Total Bilirubin 1.9 (*)    GFR calc non Af Amer 45 (*)    GFR calc Af Amer 52 (*)    Anion gap 27 (*)    All other components within normal limits  CBC - Abnormal; Notable for the following:    WBC 14.9 (*)    Hemoglobin 16.4 (*)    MCV 104.7 (*)    MCH 38.2 (*)      MCHC 36.5 (*)    All other components within normal limits  URINALYSIS, ROUTINE W REFLEX MICROSCOPIC  ETHANOL  I-STAT CG4 LACTIC ACID, ED    EKG  EKG Interpretation None       Radiology No results found.  Procedures Procedures (including critical care time)  Medications Ordered in ED Medications  LORazepam (ATIVAN) tablet 1 mg (not administered)  fentaNYL (SUBLIMAZE) injection 25 mcg (not administered)  sodium chloride 0.9 % bolus 2,000 mL (2,000 mLs Intravenous  New Bag/Given 10/30/16 1538)     Initial Impression / Assessment and Plan / ED Course  I have reviewed the triage vital signs and the nursing notes.  Pertinent labs & imaging results that were available during my care of the patient were reviewed by me and considered in my medical decision making (see chart for details).     Patient is a 13 her old female presenting today with nausea vomiting. Patient has marked elevation of her lipase as well as anion gap. Patient very evasive about her alcohol se but according to last notes she is a heavy user in the past. 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 We'll get an ultrasound to rule out any kind of gallstone pathology requiring acute intervention. Patient has been nauseated vomiting, inability to take by mouth.  Elevated Ranson criteria would recommend admission to floor. We'll give fluid, pain medication, Ativan.   Final Clinical Impressions(s) / ED Diagnoses   Final diagnoses:  Elevated lipase    New Prescriptions New Prescriptions   No medications on file     Logun Colavito Julio Alm, MD 10/30/16 1545

## 2016-10-30 NOTE — ED Triage Notes (Signed)
Pt reports being diagnosed with flu last week. Reports not feeling any better but now also has n/v, fever, fatigue. Pt feels dehydrated and wanting iv fluids.

## 2016-10-30 NOTE — H&P (Signed)
[facility] History and Physical  BRAYLYN GOODIE [id number] DOB: 02-23-1971 DOA: 10/30/2016  PCP: Eulas Post, MD  Patient coming from: Home  Chief Complaint: Nausea and vomiting  HPI: Natalie Morrison is a 46 y.o. female with a medical history of alcohol abuse, anxiety and depression, asthma, hypertension, who presented to the emergency department with complaints of nausea and vomiting. Patient states that she fell she had the flu last week and went to her primary care physician's office several days later due to the snow. She was thought to have bronchitis and was placed on a Z-Pak starting this past Monday. Patient then began to have increasing nausea and vomiting and one episode of diarrhea. Currently she denies any cough, shortness of breath, chest pain, fever, recent travel, dizziness or headache. Patient doesn't worse fatigue and abdominal pain. Patient does state she drinks approximately 2-3 drinks per evening, including bourbon and wine. She states she used to drink very heavily approximately one year ago but has tried to cut back.  ED Course: Found to have elevated lipase. Being treated for alcohol-induced pancreatitis. Given 2 L of fluid. Ultrasound of the abdomen pending. [facility] called for admission.  Review of Systems:  All other systems reviewed and are negative.   Past Medical History:  Diagnosis Date  . Alcohol abuse   . Anxiety and depression   . Asthma   . Hypertension     Past Surgical History:  Procedure Laterality Date  . BREAST LUMPECTOMY    . TONSILLECTOMY AND ADENOIDECTOMY Bilateral over 30 years ago    Social History:  reports that she has been smoking Cigarettes.  She has a 12.50 pack-year smoking history. She has never used smokeless tobacco. She reports that she drinks about 1.8 oz of alcohol per week . She reports that she uses drugs, including Marijuana, about 1 time per week.   Allergies  Allergen Reactions  . Nitrofurantoin Hives  .  Other Other (See Comments)    Allergies mold, dust per allergy test  . Sulfonamide Derivatives Other (See Comments)    Unknown allergic reaction per husband     Family History  Problem Relation Age of Onset  . Heart disease Mother   . Liver disease Mother   . Heart disease Father     Prior to Admission medications   Medication Sig Start Date End Date Taking? Authorizing Provider  albuterol (PROVENTIL,VENTOLIN) 90 MCG/ACT inhaler Inhale 2 puffs into the lungs every 6 (six) hours as needed for wheezing or shortness of breath. Reported on 01/02/2016   Yes Historical Provider, MD  benazepril-hydrochlorthiazide (LOTENSIN HCT) 20-25 MG tablet Take 0.5 tablets by mouth daily. Take one half tablet daily   Yes Historical Provider, MD  budesonide (PULMICORT) 0.25 MG/2ML nebulizer solution Take 2 mLs (0.25 mg total) by nebulization 2 (two) times daily. 09/24/15  Yes Ripudeep Krystal Eaton, MD  cetirizine (ZYRTEC) 10 MG chewable tablet Chew 10 mg by mouth at bedtime.    Yes Historical Provider, MD  Cyanocobalamin (VITAMIN B-12 PO) Take 1 tablet by mouth daily.   Yes Historical Provider, MD  fluticasone (FLONASE) 50 MCG/ACT nasal spray Place 1 spray into both nostrils daily.   Yes Historical Provider, MD  GLUCOSAMINE-CHONDROITIN PO Take 1 tablet by mouth daily.   Yes Historical Provider, MD  Multiple Vitamin (MULTIVITAMIN WITH MINERALS) TABS tablet Take 1 tablet by mouth daily. One a Day Women's   Yes Historical Provider, MD  ondansetron (ZOFRAN ODT) 4 MG disintegrating tablet Take 1 tablet (  4 mg total) by mouth every 8 (eight) hours as needed for nausea or vomiting. 10/29/16  Yes Lysbeth Penner, FNP  pantoprazole (PROTONIX) 40 MG tablet Take 1 tablet (40 mg total) by mouth 2 (two) times daily. 11/05/15  Yes Eulas Post, MD  thiamine 100 MG tablet Take 1 tablet (100 mg total) by mouth daily. 09/24/15  Yes Ripudeep Krystal Eaton, MD  arformoterol (BROVANA) 15 MCG/2ML NEBU Take 2 mLs (15 mcg total) by nebulization 2  (two) times daily. Patient not taking: Reported on 10/30/2016 09/24/15   Ripudeep Krystal Eaton, MD    Physical Exam: Vitals:   10/30/16 1530 10/30/16 1600  BP: 157/96 (!) 161/104  Pulse: 96 92  Resp:    Temp:       General: Well developed, well nourished, NAD, appears stated age  HEENT: NCAT, PERRLA, EOMI, Anicteic Sclera, mucous membranes dry.   Neck: Supple, no JVD, no masses  Cardiovascular: S1 S2 auscultated, no rubs, murmurs or gallops. Regular rate and rhythm.  Respiratory: Clear to auscultation bilaterally with equal chest rise  Abdomen: Soft, epigastric/LUQ TTP, nondistended, + bowel sounds  Extremities: warm dry without cyanosis clubbing or edema  Neuro: AAOx3, cranial nerves grossly intact. Strength 5/5 in patient's upper and lower extremities bilaterally, slight tremor in hands  Skin: Without rashes exudates or nodules  Psych: Normal affect and demeanor with intact judgement and insight  Labs on Admission: I have personally reviewed following labs and imaging studies CBC:  Recent Labs Lab 10/30/16 1206  WBC 14.9*  HGB 16.4*  HCT 44.9  MCV 104.7*  PLT 0000000   Basic Metabolic Panel:  Recent Labs Lab 10/30/16 1206  NA 130*  K 3.1*  CL 91*  CO2 12*  GLUCOSE 194*  BUN 20  CREATININE 1.38*  CALCIUM 9.8   GFR: Estimated Creatinine Clearance: 46.3 mL/min (by C-G formula based on SCr of 1.38 mg/dL (H)). Liver Function Tests:  Recent Labs Lab 10/30/16 1206  AST 87*  ALT 123*  ALKPHOS 51  BILITOT 1.9*  PROT 7.9  ALBUMIN 5.2*    Recent Labs Lab 10/30/16 1206  LIPASE 1,750*   No results for input(s): AMMONIA in the last 168 hours. Coagulation Profile: No results for input(s): INR, PROTIME in the last 168 hours. Cardiac Enzymes: No results for input(s): CKTOTAL, CKMB, CKMBINDEX, TROPONINI in the last 168 hours. BNP (last 3 results) No results for input(s): PROBNP in the last 8760 hours. HbA1C: No results for input(s): HGBA1C in the last 72  hours. CBG: No results for input(s): GLUCAP in the last 168 hours. Lipid Profile: No results for input(s): CHOL, HDL, LDLCALC, TRIG, CHOLHDL, LDLDIRECT in the last 72 hours. Thyroid Function Tests: No results for input(s): TSH, T4TOTAL, FREET4, T3FREE, THYROIDAB in the last 72 hours. Anemia Panel: No results for input(s): VITAMINB12, FOLATE, FERRITIN, TIBC, IRON, RETICCTPCT in the last 72 hours. Urine analysis:    Component Value Date/Time   COLORURINE YELLOW 10/28/2015 1507   APPEARANCEUR CLOUDY (A) 10/28/2015 1507   LABSPEC 1.012 10/28/2015 1507   PHURINE 6.0 10/28/2015 1507   GLUCOSEU NEGATIVE 10/28/2015 1507   HGBUR NEGATIVE 10/28/2015 1507   HGBUR negative 01/01/2010 1340   BILIRUBINUR 3+ 03/18/2016 1614   KETONESUR 40 (A) 10/28/2015 1507   PROTEINUR 3+ 03/18/2016 1614   PROTEINUR 30 (A) 10/28/2015 1507   UROBILINOGEN 2.0 03/18/2016 1614   UROBILINOGEN 0.2 05/06/2013 1440   NITRITE positive 03/18/2016 1614   NITRITE NEGATIVE 10/28/2015 1507   LEUKOCYTESUR Negative 03/18/2016  1614   Sepsis Labs: @LABRCNTIP (procalcitonin:4,lacticidven:4) )No results found for this or any previous visit (from the past 240 hour(s)).   Radiological Exams on Admission: No results found.  EKG: None  Assessment/Plan  Acute pancreatitis with nausea and vomiting -Likely secondary to alcoholism -Lipase upon admission 1750 -Abdominal US pending -NPO -placed on IVF, banana bag -Continue antiemetics and pain control as needed  Acute kidney injury -Secondary to dehydration -Baseline creatinine 0.7, upon admission Cr 1.38 -Will place patient on IV fluids and pain monitor BMP  Hypokalemia -Likely secondary to GI losses -Will replace and continue to monitor BMP  Hyponatremia -Secondary to GI losses and dehydration -Placed on IV fluids, continue to monitor BMP  Metabolic acidosis/lactic acidosis -Likely secondary to recent infection, GI losses -Will placed on sodium bicarbonate drip  and continue to monitor BMP  Recent upper respiratory infection -Patient was recently placed on Z-Pak for possible bronchitis. Although she states she thought she had the flu. -Influenza PCR and chest x-ray pending  Alcohol abuse/withdrawal -Counseled -Patient states she drinks approximately 3 drinks per night including bourbon and wine -She is not very forthcoming with this information. -Currently has mild tremor -Will place on CIWA protocol and banana bag -Admitted to tele for close monitoring -neuro checks   Tobacco abuse -Smoking cessation discussed -Nicotine patch  Essential hypertension -Hold Lotensin this patient does have acute kidney injury -Will place on hydralazine IV as needed  Asthma -Appears to be stable, continue home medications   DVT prophylaxis: Lovenox  Code Status: Full  Family Communication: None at bedside. Admission, patients condition and plan of care including tests being ordered have been discussed with the patient, who indicates understanding and agrees with the plan and Code Status.  Disposition Plan: Home when pancreatitis has resolved  Consults called: none   Admission status: Admitted inpatient, tele   Time spent: 70 minutes  Jamoni Broadfoot D.O. [facility] Pager [phone]  If 7PM-7AM, please contact night-coverage [email or web address] Password [facility] 10/30/2016, 5:33 PM

## 2016-10-30 NOTE — ED Notes (Signed)
Called pt's name to take to room, no one answered.

## 2016-10-31 DIAGNOSIS — K852 Alcohol induced acute pancreatitis without necrosis or infection: Secondary | ICD-10-CM

## 2016-10-31 LAB — COMPREHENSIVE METABOLIC PANEL
ALT: 69 U/L — ABNORMAL HIGH (ref 14–54)
AST: 63 U/L — ABNORMAL HIGH (ref 15–41)
Albumin: 3.5 g/dL (ref 3.5–5.0)
Alkaline Phosphatase: 43 U/L (ref 38–126)
Anion gap: 20 — ABNORMAL HIGH (ref 5–15)
BUN: 13 mg/dL (ref 6–20)
CO2: 20 mmol/L — ABNORMAL LOW (ref 22–32)
Calcium: 8.5 mg/dL — ABNORMAL LOW (ref 8.9–10.3)
Chloride: 93 mmol/L — ABNORMAL LOW (ref 101–111)
Creatinine, Ser: 0.99 mg/dL (ref 0.44–1.00)
GFR calc Af Amer: >60 mL/min (ref >60)
GFR calc non Af Amer: >60 mL/min (ref >60)
Glucose, Bld: 148 mg/dL — ABNORMAL HIGH (ref 65–99)
Potassium: 2.7 mmol/L — CL (ref 3.5–5.1)
Sodium: 133 mmol/L — ABNORMAL LOW (ref 135–145)
Total Bilirubin: 1.3 mg/dL — ABNORMAL HIGH (ref 0.3–1.2)
Total Protein: 6.4 g/dL — ABNORMAL LOW (ref 6.5–8.1)

## 2016-10-31 LAB — CBC
HCT: 45.3 % (ref 36.0–46.0)
Hemoglobin: 16.7 g/dL — ABNORMAL HIGH (ref 12.0–15.0)
MCH: 38.7 pg — ABNORMAL HIGH (ref 26.0–34.0)
MCHC: 36.9 g/dL — ABNORMAL HIGH (ref 30.0–36.0)
MCV: 104.9 fL — ABNORMAL HIGH (ref 78.0–100.0)
Platelets: 128 10*3/uL — ABNORMAL LOW (ref 150–400)
RBC: 4.32 MIL/uL (ref 3.87–5.11)
RDW: 11.5 % (ref 11.5–15.5)
WBC: 23 10*3/uL — ABNORMAL HIGH (ref 4.0–10.5)

## 2016-10-31 LAB — LIPASE, BLOOD: Lipase: 599 U/L — ABNORMAL HIGH (ref 11–51)

## 2016-10-31 LAB — LACTIC ACID, PLASMA: Lactic Acid, Venous: 1.6 mmol/L (ref 0.5–1.9)

## 2016-10-31 MED ORDER — SODIUM CHLORIDE 0.9 % IV SOLN
30.0000 meq | Freq: Once | INTRAVENOUS | Status: AC
  Administered 2016-10-31: 30 meq via INTRAVENOUS
  Filled 2016-10-31: qty 15

## 2016-10-31 MED ORDER — ORAL CARE MOUTH RINSE
15.0000 mL | Freq: Two times a day (BID) | OROMUCOSAL | Status: DC
  Administered 2016-11-02: 15 mL via OROMUCOSAL

## 2016-10-31 MED ORDER — POTASSIUM CHLORIDE 20 MEQ PO PACK
40.0000 meq | PACK | Freq: Once | ORAL | Status: AC
  Administered 2016-10-31: 40 meq via ORAL
  Filled 2016-10-31 (×2): qty 2

## 2016-10-31 MED ORDER — KCL IN DEXTROSE-NACL 20-5-0.2 MEQ/L-%-% IV SOLN
INTRAVENOUS | Status: DC
  Administered 2016-10-31 – 2016-11-01 (×3): via INTRAVENOUS
  Filled 2016-10-31 (×8): qty 1000

## 2016-10-31 MED ORDER — CHLORHEXIDINE GLUCONATE 0.12 % MT SOLN
15.0000 mL | Freq: Two times a day (BID) | OROMUCOSAL | Status: DC
  Administered 2016-10-31 – 2016-11-02 (×5): 15 mL via OROMUCOSAL
  Filled 2016-10-31 (×5): qty 15

## 2016-10-31 NOTE — Progress Notes (Signed)
CRITICAL VALUE ALERT  Critical value received: K+ 2.7  Date of notification:  10/31/16  Time of notification:  07:10 Critical value read back: yes  Nurse who received alert:  Karlene Southard, Josephine Cables   MD notified (1st page):  Posey Pronto  Time of first page:  0718  MD notified (2nd page):  Time of second page:  Responding MD:  Posey Pronto  Time MD responded:  [phone]

## 2016-10-31 NOTE — Progress Notes (Signed)
Flu PCR negative Droplet precautions discontinued. Jacqualyn Posey, RN

## 2016-10-31 NOTE — Progress Notes (Signed)
[facility] Progress Note  Patient: Natalie Morrison [id number]   PCP: Eulas Post, MD DOB: [birth year]   DOA: 10/30/2016   DOS: 10/31/2016   Date of Service: the patient was seen and examined on 10/31/2016  Brief hospital course: Pt. with PMH of HTN, depression, alcohol abuse, smoker; admitted on 10/30/2016, with complaint of abdominal pain, was found to have pancreatitis. Currently further plan is continue supportive management.  Assessment and Plan: Acute pancreatitis with nausea and vomiting -Likely secondary to alcoholism -Lipase upon admission 1750 -Abdominal US negative for gallstones -NPO -placed on IVF, banana bag -Continue antiemetics and pain control as needed  Acute kidney injury -Secondary to dehydration -Baseline creatinine 0.7, upon admission Cr 1.38 -on IV fluids and pain monitor BMP  Hypokalemia -Likely secondary to GI losses -Will replace and continue to monitor BMP  Hyponatremia -Secondary to GI losses and dehydration -Placed on IV fluids, continue to monitor BMP  Metabolic acidosis/lactic acidosis -Likely secondary to recent infection, GI losses -Will placed on sodium bicarbonate drip and continue to monitor BMP  Recent upper respiratory infection -Patient was recently placed on Z-Pak for possible bronchitis. Although she states she thought she had the flu. -Influenza PCR and chest x-ray pending  Alcohol abuse/withdrawal -Counseled -Patient states she drinks approximately 3 drinks per night including bourbon and wine -She is not very forthcoming with this information. -Currently has mild tremor -Will place on CIWA protocol and banana bag -neuro checks   Tobacco abuse -Smoking cessation discussed -Nicotine patch  Essential hypertension -Hold Lotensin this patient does have acute kidney injury -Will place on hydralazine IV as needed  Asthma -Appears to be stable, continue home medications  Bowel regimen: last BM  10/30/2016 Diet: NPO DVT Prophylaxis: subcutaneous Heparin  Advance goals of care discussion: full code  Family Communication: no family was present at bedside, at the time of interview.  Disposition:  Discharge to home. Expected discharge date: 11/03/2016,   Consultants: none Procedures: none  Antibiotics: Anti-infectives    None        Subjective: Feeling better, continues to have some abdominal pain. No fever no chills.  Objective: Physical Exam: Vitals:   10/31/16 0542 10/31/16 0720 10/31/16 0754 10/31/16 1610  BP: (!) 136/106   (!) 137/91  Pulse: (!) 143 (!) 115  (!) 113  Resp: 18   20  Temp: 98.4 F (36.9 C)   98.4 F (36.9 C)  TempSrc:      SpO2: 100%  98% 99%    Intake/Output Summary (Last 24 hours) at 10/31/16 1701 Last data filed at 10/31/16 1020  Gross per 24 hour  Intake          3020.83 ml  Output              400 ml  Net          2620.83 ml   There were no vitals filed for this visit.  General: Alert, Awake and Oriented to Time, Place and Person. Appear in mild distress, affect appropriate Eyes: PERRL, Conjunctiva normal ENT: Oral Mucosa clear moist. Neck: no JVD, no Abnormal Mass Or lumps Cardiovascular: S1 and S2 Present, no Murmur, Respiratory: Bilateral Air entry equal and Decreased, no use of accessory muscle, Clear to Auscultation, no Crackles, no wheezes Abdomen: Bowel Sound present, Soft and mild tenderness Skin: no redness, no Rash, no induration Extremities: no Pedal edema, no calf tenderness Neurologic: Grossly no focal neuro deficit. Bilaterally Equal motor strength  Data Reviewed: CBC:  Recent Labs  Lab 10/30/16 1206 10/31/16 0558  WBC 14.9* 23.0*  HGB 16.4* 16.7*  HCT 44.9 45.3  MCV 104.7* 104.9*  PLT 153 0000000*   Basic Metabolic Panel:  Recent Labs Lab 10/30/16 1206 10/31/16 0558  NA 130* 133*  K 3.1* 2.7*  CL 91* 93*  CO2 12* 20*  GLUCOSE 194* 148*  BUN 20 13  CREATININE 1.38* 0.99  CALCIUM 9.8 8.5*     Liver Function Tests:  Recent Labs Lab 10/30/16 1206 10/31/16 0558  AST 87* 63*  ALT 123* 69*  ALKPHOS 51 43  BILITOT 1.9* 1.3*  PROT 7.9 6.4*  ALBUMIN 5.2* 3.5    Recent Labs Lab 10/30/16 1206 10/31/16 0800  LIPASE 1,750* 599*   No results for input(s): AMMONIA in the last 168 hours. Coagulation Profile: No results for input(s): INR, PROTIME in the last 168 hours. Cardiac Enzymes: No results for input(s): CKTOTAL, CKMB, CKMBINDEX, TROPONINI in the last 168 hours. BNP (last 3 results) No results for input(s): PROBNP in the last 8760 hours.  CBG: No results for input(s): GLUCAP in the last 168 hours.  Studies: Dg Chest 2 View  Result Date: 10/30/2016 CLINICAL DATA:  Ankle abuse, anxiety and depression. Asthma and hypertension. Complaints currently of nausea and vomiting. Fall as well as flu last week. EXAM: CHEST  2 VIEW COMPARISON:  10/28/2015 and 09/22/2015 FINDINGS: Lungs are well inflated without focal consolidation or effusion. Mild stable elevation of the left hemidiaphragm. Cardiomediastinal silhouette is within normal. Remaining bones and soft tissues are within normal. IMPRESSION: No active cardiopulmonary disease. Electronically Signed   By: Marin Olp M.D.   On: 10/30/2016 20:15   US Abdomen Limited Ruq  Result Date: 10/30/2016 CLINICAL DATA:  Elevated lipase. EXAM: US ABDOMEN LIMITED - RIGHT UPPER QUADRANT COMPARISON:  11/12/2015 FINDINGS: Gallbladder: No gallstones or wall thickening visualized. No sonographic Murphy sign noted by sonographer. Common bile duct: Diameter: 5.5 mm Liver: Diffusely increased echogenicity and enlargement of the liver. Accurate measurement could not be obtained as the left lobe of the liver is obscured by bowel gas. Normal direction of flow within the main portal vein. IMPRESSION: Diffusely increased echogenicity and enlargement of the liver, which may be seen with hepatic steatosis or other hepatocellular disease. Electronically  Signed   By: Fidela Salisbury M.D.   On: 10/30/2016 18:01     Scheduled Meds: . budesonide  0.25 mg Nebulization BID  . chlorhexidine  15 mL Mouth Rinse BID  . enoxaparin (LOVENOX) injection  40 mg Subcutaneous Q24H  . fluticasone  1 spray Each Nare Daily  . folic acid  1 mg Oral Daily  . LORazepam  0-4 mg Intravenous Q6H   Followed by  . [START ON 11/01/2016] LORazepam  0-4 mg Intravenous Q12H  . mouth rinse  15 mL Mouth Rinse q12n4p  . multivitamin with minerals  1 tablet Oral Daily  . nicotine  14 mg Transdermal Q24H  . pantoprazole  40 mg Oral BID  . sodium chloride flush  3 mL Intravenous Q12H  . thiamine  100 mg Oral Daily   Or  . thiamine  100 mg Intravenous Daily   Continuous Infusions: . dextrose 5 % and 0.2 % NaCl with KCl 20 mEq 75 mL/hr at 10/31/16 0930   PRN Meds: albuterol, hydrALAZINE, LORazepam **OR** LORazepam, morphine injection, ondansetron **OR** ondansetron (ZOFRAN) IV  Time spent: 30 minutes  Author: Berle Mull, MD [facility] Pager: [phone] 10/31/2016 5:01 PM  If 7PM-7AM, please contact night-coverage at [email or web address],  password [facility]

## 2016-11-01 LAB — URINALYSIS, ROUTINE W REFLEX MICROSCOPIC
Bilirubin Urine: NEGATIVE
Glucose, UA: >=500 mg/dL — AB
Ketones, ur: NEGATIVE mg/dL
Leukocytes, UA: NEGATIVE
Nitrite: NEGATIVE
Protein, ur: 100 mg/dL — AB
Specific Gravity, Urine: 1.025 (ref 1.005–1.030)
pH: 6 (ref 5.0–8.0)

## 2016-11-01 LAB — COMPREHENSIVE METABOLIC PANEL
ALT: 36 U/L (ref 14–54)
AST: 33 U/L (ref 15–41)
Albumin: 2.8 g/dL — ABNORMAL LOW (ref 3.5–5.0)
Alkaline Phosphatase: 47 U/L (ref 38–126)
Anion gap: 13 (ref 5–15)
BUN: 10 mg/dL (ref 6–20)
CO2: 24 mmol/L (ref 22–32)
Calcium: 8.9 mg/dL (ref 8.9–10.3)
Chloride: 93 mmol/L — ABNORMAL LOW (ref 101–111)
Creatinine, Ser: 0.68 mg/dL (ref 0.44–1.00)
GFR calc Af Amer: >60 mL/min (ref >60)
GFR calc non Af Amer: >60 mL/min (ref >60)
Glucose, Bld: 179 mg/dL — ABNORMAL HIGH (ref 65–99)
Potassium: 3.1 mmol/L — ABNORMAL LOW (ref 3.5–5.1)
Sodium: 130 mmol/L — ABNORMAL LOW (ref 135–145)
Total Bilirubin: 0.8 mg/dL (ref 0.3–1.2)
Total Protein: 5.7 g/dL — ABNORMAL LOW (ref 6.5–8.1)

## 2016-11-01 LAB — CBC WITH DIFFERENTIAL/PLATELET
Basophils Absolute: 0 10*3/uL (ref 0.0–0.1)
Basophils Relative: 0 %
Eosinophils Absolute: 0 10*3/uL (ref 0.0–0.7)
Eosinophils Relative: 0 %
HCT: 39.8 % (ref 36.0–46.0)
Hemoglobin: 14.4 g/dL (ref 12.0–15.0)
Lymphocytes Relative: 7 %
Lymphs Abs: 1.3 10*3/uL (ref 0.7–4.0)
MCH: 37.5 pg — ABNORMAL HIGH (ref 26.0–34.0)
MCHC: 36.2 g/dL — ABNORMAL HIGH (ref 30.0–36.0)
MCV: 103.6 fL — ABNORMAL HIGH (ref 78.0–100.0)
Monocytes Absolute: 2.6 10*3/uL — ABNORMAL HIGH (ref 0.1–1.0)
Monocytes Relative: 14 %
Neutro Abs: 14.8 10*3/uL — ABNORMAL HIGH (ref 1.7–7.7)
Neutrophils Relative %: 79 %
Platelets: 100 10*3/uL — ABNORMAL LOW (ref 150–400)
RBC: 3.84 MIL/uL — ABNORMAL LOW (ref 3.87–5.11)
RDW: 11.4 % — ABNORMAL LOW (ref 11.5–15.5)
WBC: 18.7 10*3/uL — ABNORMAL HIGH (ref 4.0–10.5)

## 2016-11-01 LAB — MAGNESIUM: Magnesium: 1.5 mg/dL — ABNORMAL LOW (ref 1.7–2.4)

## 2016-11-01 LAB — LIPASE, BLOOD: Lipase: 85 U/L — ABNORMAL HIGH (ref 11–51)

## 2016-11-01 LAB — PROTIME-INR
INR: 1.02
Prothrombin Time: 13.5 seconds (ref 11.4–15.2)

## 2016-11-01 MED ORDER — MAGNESIUM SULFATE 2 GM/50ML IV SOLN
2.0000 g | Freq: Once | INTRAVENOUS | Status: AC
  Administered 2016-11-01: 2 g via INTRAVENOUS
  Filled 2016-11-01: qty 50

## 2016-11-01 MED ORDER — SODIUM CHLORIDE 0.9 % IV SOLN
30.0000 meq | Freq: Once | INTRAVENOUS | Status: AC
  Administered 2016-11-01: 30 meq via INTRAVENOUS
  Filled 2016-11-01: qty 15

## 2016-11-01 NOTE — Progress Notes (Signed)
[facility] Progress Note  Patient: Natalie Morrison [id number]   PCP: Eulas Post, MD DOB: 1971-07-31   DOA: 10/30/2016   DOS: 11/01/2016   Date of Service: the patient was seen and examined on 11/01/2016  Brief hospital course: Pt. with PMH of HTN, depression, alcohol abuse, smoker; admitted on 10/30/2016, with complaint of abdominal pain, was found to have pancreatitis. Currently further plan is continue supportive management.  Assessment and Plan: Acute pancreatitis with nausea and vomiting -Likely secondary to alcoholism -Lipase upon admission 1750 -Abdominal US negative for gallstones - advance to clear liquid diet.  -placed on IVF, banana bag -Continue antiemetics and pain control as needed  Acute kidney injury -Secondary to dehydration -Baseline creatinine 0.7, upon admission Cr 1.38 -on IV fluids and pain monitor BMP  Hypokalemia -Likely secondary to GI losses -Will replace and continue to monitor BMP  Hyponatremia -Secondary to GI losses and dehydration -Placed on IV fluids, continue to monitor BMP  Metabolic acidosis/lactic acidosis -Likely secondary to recent infection, GI losses -Will placed on sodium bicarbonate drip and continue to monitor BMP  Recent upper respiratory infection -Patient was recently placed on Z-Pak for possible bronchitis. Although she states she thought she had the flu. -Influenza PCR and chest x-ray pending  Alcohol abuse/withdrawal -Counseled -Patient states she drinks approximately 3 drinks per night including bourbon and wine -She is not very forthcoming with this information. -Currently has mild tremor -Will place on CIWA protocol and banana bag -neuro checks   Tobacco abuse -Smoking cessation discussed -Nicotine patch  Essential hypertension -Hold Lotensin this patient does have acute kidney injury -Will place on hydralazine IV as needed  Asthma -Appears to be stable, continue home medications  Bowel  regimen: last BM 10/30/2016 Diet: NPO DVT Prophylaxis: subcutaneous Heparin  Advance goals of care discussion: full code  Family Communication: no family was present at bedside, at the time of interview.  Disposition:  Discharge to home. Expected discharge date: 11/03/2016,   Consultants: none Procedures: none  Antibiotics: Anti-infectives    None        Subjective: Feeling better, no abdominal pain. No fever no chills.  Objective: Physical Exam: Vitals:   10/31/16 2216 10/31/16 2344 11/01/16 0534 11/01/16 0752  BP:  (!) 129/91 110/84   Pulse:  (!) 133 (!) 121   Resp:  18 16   Temp:  97.5 F (36.4 C) 99.6 F (37.6 C)   TempSrc:  Oral Oral   SpO2: 98% 99% 100% 99%    Intake/Output Summary (Last 24 hours) at 11/01/16 1435 Last data filed at 11/01/16 0240  Gross per 24 hour  Intake           1287.5 ml  Output              200 ml  Net           1087.5 ml   There were no vitals filed for this visit.  General: Alert, Awake and Oriented to Time, Place and Person. Appear in mild distress, affect appropriate Eyes: PERRL, Conjunctiva normal ENT: Oral Mucosa clear moist. Neck: no JVD, no Abnormal Mass Or lumps Cardiovascular: S1 and S2 Present, no Murmur, Respiratory: Bilateral Air entry equal and Decreased, no use of accessory muscle, Clear to Auscultation, no Crackles, no wheezes Abdomen: Bowel Sound present, Soft and mild tenderness Skin: no redness, no Rash, no induration Extremities: no Pedal edema, no calf tenderness Neurologic: Grossly no focal neuro deficit. Bilaterally Equal motor strength  Data Reviewed: CBC:  Recent Labs Lab 10/30/16 1206 10/31/16 0558 11/01/16 0638  WBC 14.9* 23.0* 18.7*  NEUTROABS  --   --  14.8*  HGB 16.4* 16.7* 14.4  HCT 44.9 45.3 39.8  MCV 104.7* 104.9* 103.6*  PLT 153 128* 123XX123*   Basic Metabolic Panel:  Recent Labs Lab 10/30/16 1206 10/31/16 0558 11/01/16 0638  NA 130* 133* 130*  K 3.1* 2.7* 3.1*  CL 91* 93* 93*    CO2 12* 20* 24  GLUCOSE 194* 148* 179*  BUN 20 13 10   CREATININE 1.38* 0.99 0.68  CALCIUM 9.8 8.5* 8.9  MG  --   --  1.5*    Liver Function Tests:  Recent Labs Lab 10/30/16 1206 10/31/16 0558 11/01/16 0638  AST 87* 63* 33  ALT 123* 69* 36  ALKPHOS 51 43 47  BILITOT 1.9* 1.3* 0.8  PROT 7.9 6.4* 5.7*  ALBUMIN 5.2* 3.5 2.8*    Recent Labs Lab 10/30/16 1206 10/31/16 0800 11/01/16 0638  LIPASE 1,750* 599* 85*   No results for input(s): AMMONIA in the last 168 hours. Coagulation Profile:  Recent Labs Lab 11/01/16 0638  INR 1.02   Cardiac Enzymes: No results for input(s): CKTOTAL, CKMB, CKMBINDEX, TROPONINI in the last 168 hours. BNP (last 3 results) No results for input(s): PROBNP in the last 8760 hours.  CBG: No results for input(s): GLUCAP in the last 168 hours.  Studies: No results found.   Scheduled Meds: . budesonide  0.25 mg Nebulization BID  . chlorhexidine  15 mL Mouth Rinse BID  . enoxaparin (LOVENOX) injection  40 mg Subcutaneous Q24H  . fluticasone  1 spray Each Nare Daily  . folic acid  1 mg Oral Daily  . LORazepam  0-4 mg Intravenous Q6H   Followed by  . LORazepam  0-4 mg Intravenous Q12H  . magnesium sulfate 1 - 4 g bolus IVPB  2 g Intravenous Once  . mouth rinse  15 mL Mouth Rinse q12n4p  . multivitamin with minerals  1 tablet Oral Daily  . nicotine  14 mg Transdermal Q24H  . pantoprazole  40 mg Oral BID  . potassium chloride (KCL MULTIRUN) 30 mEq in 265 mL IVPB  30 mEq Intravenous Once  . sodium chloride flush  3 mL Intravenous Q12H  . thiamine  100 mg Oral Daily   Or  . thiamine  100 mg Intravenous Daily   Continuous Infusions: . dextrose 5 % and 0.2 % NaCl with KCl 20 mEq 75 mL/hr at 11/01/16 0240   PRN Meds: albuterol, hydrALAZINE, LORazepam **OR** LORazepam, morphine injection, ondansetron **OR** ondansetron (ZOFRAN) IV  Time spent: 30 minutes  Author: Berle Mull, MD [facility] Pager: [phone] 11/01/2016 2:35  PM  If 7PM-7AM, please contact night-coverage at [email or web address], password [facility]

## 2016-11-02 LAB — LIPASE, BLOOD: Lipase: 46 U/L (ref 11–51)

## 2016-11-02 LAB — COMPREHENSIVE METABOLIC PANEL
ALT: 29 U/L (ref 14–54)
AST: 29 U/L (ref 15–41)
Albumin: 2.6 g/dL — ABNORMAL LOW (ref 3.5–5.0)
Alkaline Phosphatase: 46 U/L (ref 38–126)
Anion gap: 7 (ref 5–15)
BUN: 9 mg/dL (ref 6–20)
CO2: 26 mmol/L (ref 22–32)
Calcium: 8.5 mg/dL — ABNORMAL LOW (ref 8.9–10.3)
Chloride: 94 mmol/L — ABNORMAL LOW (ref 101–111)
Creatinine, Ser: 0.73 mg/dL (ref 0.44–1.00)
GFR calc Af Amer: >60 mL/min (ref >60)
GFR calc non Af Amer: >60 mL/min (ref >60)
Glucose, Bld: 181 mg/dL — ABNORMAL HIGH (ref 65–99)
Potassium: 3.4 mmol/L — ABNORMAL LOW (ref 3.5–5.1)
Sodium: 127 mmol/L — ABNORMAL LOW (ref 135–145)
Total Bilirubin: 0.6 mg/dL (ref 0.3–1.2)
Total Protein: 5.3 g/dL — ABNORMAL LOW (ref 6.5–8.1)

## 2016-11-02 LAB — CBC WITH DIFFERENTIAL/PLATELET
Basophils Absolute: 0 10*3/uL (ref 0.0–0.1)
Basophils Relative: 0 %
Eosinophils Absolute: 0.1 10*3/uL (ref 0.0–0.7)
Eosinophils Relative: 1 %
HCT: 33.6 % — ABNORMAL LOW (ref 36.0–46.0)
Hemoglobin: 11.8 g/dL — ABNORMAL LOW (ref 12.0–15.0)
Lymphocytes Relative: 13 %
Lymphs Abs: 1.4 10*3/uL (ref 0.7–4.0)
MCH: 36.5 pg — ABNORMAL HIGH (ref 26.0–34.0)
MCHC: 35.1 g/dL (ref 30.0–36.0)
MCV: 104 fL — ABNORMAL HIGH (ref 78.0–100.0)
Monocytes Absolute: 2 10*3/uL — ABNORMAL HIGH (ref 0.1–1.0)
Monocytes Relative: 18 %
Neutro Abs: 7.5 10*3/uL (ref 1.7–7.7)
Neutrophils Relative %: 68 %
Platelets: 136 10*3/uL — ABNORMAL LOW (ref 150–400)
RBC: 3.23 MIL/uL — ABNORMAL LOW (ref 3.87–5.11)
RDW: 11.3 % — ABNORMAL LOW (ref 11.5–15.5)
WBC: 11 10*3/uL — ABNORMAL HIGH (ref 4.0–10.5)

## 2016-11-02 MED ORDER — ONDANSETRON 4 MG PO TBDP: 4.0000 mg | ORAL_TABLET | Freq: Three times a day (TID) | ORAL | 0 refills | Status: DC | PRN

## 2016-11-02 MED ORDER — SODIUM CHLORIDE 1 G PO TABS: 1.0000 g | ORAL_TABLET | Freq: Three times a day (TID) | ORAL | 0 refills | Status: DC

## 2016-11-02 MED ORDER — CYANOCOBALAMIN 1000 MCG PO TABS: 1000.0000 ug | ORAL_TABLET | Freq: Every day | ORAL | 0 refills | Status: DC

## 2016-11-02 MED ORDER — FOLIC ACID 1 MG PO TABS: 1.0000 mg | ORAL_TABLET | Freq: Every day | ORAL | 0 refills | Status: DC

## 2016-11-02 MED ORDER — NICOTINE 14 MG/24HR TD PT24: 14.0000 mg | MEDICATED_PATCH | TRANSDERMAL | 0 refills | Status: DC

## 2016-11-02 MED ORDER — SODIUM CHLORIDE 1 G PO TABS
1.0000 g | ORAL_TABLET | Freq: Three times a day (TID) | ORAL | Status: DC
  Administered 2016-11-02: 1 g via ORAL
  Filled 2016-11-02 (×2): qty 1

## 2016-11-02 MED ORDER — THIAMINE HCL 100 MG PO TABS: 100.0000 mg | ORAL_TABLET | Freq: Every day | ORAL | 0 refills | Status: DC

## 2016-11-02 MED ORDER — VITAMIN B-12 1000 MCG PO TABS
1000.0000 ug | ORAL_TABLET | Freq: Every day | ORAL | Status: DC
  Administered 2016-11-02: 1000 ug via ORAL
  Filled 2016-11-02: qty 1

## 2016-11-02 MED ORDER — PANTOPRAZOLE SODIUM 40 MG PO TBEC: 40.0000 mg | DELAYED_RELEASE_TABLET | Freq: Two times a day (BID) | ORAL | 0 refills | Status: DC

## 2016-11-02 MED ORDER — THIAMINE HCL 100 MG PO TABS
100.0000 mg | ORAL_TABLET | Freq: Every day | ORAL | 0 refills | Status: DC
Start: 2016-11-02 — End: 2016-11-17

## 2016-11-02 NOTE — Evaluation (Signed)
Physical Therapy Evaluation Patient Details Name: Natalie Morrison MRN: [id number] DOB: [birth year] Today's Date: 11/02/2016   History of Present Illness  Pt. with PMH of HTN, depression, alcohol abuse, smoker; admitted on 10/30/2016, with complaint of abdominal pain, was found to have pancreatitis.  Clinical Impression  Pt admitted with above diagnosis. Pt currently with functional limitations due to the deficits listed below (see PT Problem List).  Pt will benefit from skilled PT to increase their independence and safety with mobility to allow discharge to the venue listed below.  Pt ambulated with slow shuffeled gait pattern with IV pole with 1 LOB.  She needed to use both hands on IV pole by end of gait with heavy reliance on them. At this time, recommend a RW, but will continue to assess during acute stay. She would benefit from OT consult and at time of d/c, outpatient PT to address balance deficits and work towards returning to prior level of function and return to work.     Follow Up Recommendations Outpatient PT;Supervision for mobility/OOB;Supervision/Assistance - 24 hour    Equipment Recommendations  Rolling walker with 5" wheels    Recommendations for Other Services OT consult     Precautions / Restrictions Precautions Precautions: Fall Restrictions Weight Bearing Restrictions: No      Mobility  Bed Mobility               General bed mobility comments: up in recliner upon arrival  Transfers Overall transfer level: Needs assistance   Transfers: Sit to/from Stand Sit to Stand: Supervision         General transfer comment: no A with standing. Cues for safety with sitting  Ambulation/Gait Ambulation/Gait assistance: Min guard;Min assist Ambulation Distance (Feet): 150 Feet Assistive device:  (IV pole) Gait Pattern/deviations: Decreased step length - right;Decreased step length - left;Shuffle;Trunk flexed Gait velocity: decreased Gait velocity interpretation:  Below normal speed for age/gender General Gait Details: Pt ambulating with IV pole with 1 hand and would switch back and forth between hands, but last 50' she needed both hands on IV pole.  Shuffled gait pattern with flexed knees and shortened steps with 1 LOB.   Stairs            Wheelchair Mobility    Modified Rankin (Stroke Patients Only)       Balance Overall balance assessment: Needs assistance           Standing balance-Leahy Scale: Poor Standing balance comment: Pt requires UE support. In static standing, she needed to hold onto window sill for support                             Pertinent Vitals/Pain Pain Assessment: No/denies pain    Home Living Family/patient expects to be discharged to:: Private residence Living Arrangements: Spouse/significant other (husband works as a Chief Operating Officer) Available Help at Discharge: Available PRN/intermittently Type of Home: House Home Access: Stairs to enter Entrance Stairs-Rails: Right;Left;Can reach both Technical brewer of Steps: [place]: [employer] - single point      Prior Function Level of Independence: Independent         Comments: Works as a Clinical cytogeneticist for a [employer]   Dominant Hand: Right    Extremity/Trunk Assessment   Upper Extremity Assessment Upper Extremity Assessment: Generalized weakness    Lower Extremity Assessment Lower Extremity Assessment: Generalized weakness  Communication   Communication: No difficulties  Cognition Arousal/Alertness: Awake/alert Behavior During Therapy: WFL for tasks assessed/performed Overall Cognitive Status: Impaired/Different from baseline Area of Impairment: Safety/judgement;Problem solving;Following commands       Following Commands: Follows one step commands consistently;Follows one step commands with increased time Safety/Judgement: Decreased awareness of deficits   Problem Solving: Slow  processing General Comments: Pt slow with instructions    General Comments General comments (skin integrity, edema, etc.): Upon entry, pt eating ice cream and was having difficulty getting spoon to mouth and then with manipulating it to get ice cream into mouth.    Exercises     Assessment/Plan    PT Assessment Patient needs continued PT services  PT Problem List Decreased strength;Decreased activity tolerance;Decreased balance;Decreased mobility;Decreased knowledge of use of DME;Decreased safety awareness          PT Treatment Interventions DME instruction;Gait training;Stair training;Functional mobility training;Therapeutic activities;Therapeutic exercise;Balance training;Neuromuscular re-education;Patient/family education    PT Goals (Current goals can be found in the Care Plan section)  Acute Rehab PT Goals Patient Stated Goal: go home PT Goal Formulation: With patient Time For Goal Achievement: 11/16/16    Frequency Min 3X/week   Barriers to discharge Decreased caregiver support      Co-evaluation               End of Session Equipment Utilized During Treatment: Gait belt Activity Tolerance: Patient tolerated treatment well Patient left: in chair;with call bell/phone within reach;with chair alarm set;with nursing/sitter in room (and MD) Nurse Communication: Mobility status;Other (comment) (MD entering and spoke with him re: function. He had seen pt ambulating with PT.)         Time: [id number] PT Time Calculation (min) (ACUTE ONLY): 23 min   Charges:   PT Evaluation $PT Eval Moderate Complexity: 1 Procedure PT Treatments $Gait Training: 8-22 mins   PT G Codes:        Akiera Allbaugh LUBECK 11/02/2016, 9:11 AM

## 2016-11-02 NOTE — Discharge Summary (Signed)
[facility] Discharge Summary   Patient: Natalie Morrison [id number]   PCP: Eulas Post, MD DOB: 1971/05/30   Date of admission: 10/30/2016   Date of discharge:  11/02/2016    Discharge Diagnoses:  Active Problems:   Alcohol abuse   Essential hypertension   History of depression   Acute kidney injury ([place])   Pancreatitis   Acute pancreatitis   Tobacco abuse   Admitted From: home Disposition:  home  Recommendations for Outpatient Follow-up:  1. Please follow up with PCP in 1 week   Follow-up Information    Eulas Post, MD. Schedule an appointment as soon as possible for a visit in 1 week.   Specialty:  Family Medicine Why:  Appointment is on 11/09/16 at 1:45pm for an 2pm appointment. Contact information: [place] [id number] [phone]          Diet recommendation: low fat diet  Activity: The patient is advised to gradually reintroduce usual activities.  Discharge Condition: good  Code Status: full code  History of present illness: As per the H and P dictated on admission, "MICHAELANNE BOATWRIGHT is a 46 y.o. female with a medical history of alcohol abuse, anxiety and depression, asthma, hypertension, who presented to the emergency department with complaints of nausea and vomiting. Patient states that she fell she had the flu last week and went to her primary care physician's office several days later due to the snow. She was thought to have bronchitis and was placed on a Z-Pak starting this past Monday. Patient then began to have increasing nausea and vomiting and one episode of diarrhea. Currently she denies any cough, shortness of breath, chest pain, fever, recent travel, dizziness or headache. Patient doesn't worse fatigue and abdominal pain. Patient does state she drinks approximately 2-3 drinks per evening, including bourbon and wine. She states she used to drink very heavily approximately one year ago but has tried to cut  back."  Hospital Course:   Summary of her active problems in the hospital is as following. Acute pancreatitiswith nausea and vomiting -Likely secondary to alcoholism -Lipase upon admission 1750 -Abdominal US negative for gallstones -placed on IVF, banana bag - feeling better and tolerating oral diet, Recommendation is to stop alcohol and remain on low fat diet.   Acute kidney injury resolved -Secondary to dehydration -Baseline creatinine 0.7, upon admission Cr 1.38  Hypokalemia -Likely secondary to GI losses -Will replace and continue to monitor BMP  Hyponatremia, add low dose salt tabs.  -Secondary to GI losses and dehydration, worsening due to d5.   Metabolic acidosis/lactic acidosis -Likely secondary to recent infection, GI losses Resolved  Recent upper respiratory infection -Patient was recently placed on Z-Pak for possible bronchitis. Although she states she thought she had the flu. -Influenza PCR and chest x-ray negative  Alcohol abuse/withdrawal -Counseled -Patient states she drinks approximately 3 drinks per night including bourbon and wine -She is not very forthcoming with this information. -Currently has mild tremor -Will place on CIWA protocol and banana bag - refused to assistance for help with rehabilitation and says will stop drinking.   Tobacco abuse -Smoking cessation discussed -Nicotine patch  Essential hypertension -Hold Lotensin this patient does have acute kidney injury -Will place on hydralazine IV as needed  Asthma -Appears to be stable, continue home medications  All other chronic medical condition were stable during the hospitalization.  Patient was seen by physical therapy, who recommended outpatient , which was arranged by social worker and case  Freight forwarder. On the day of the discharge the patient's vitals were stable, and no other acute medical condition were reported by patient. the patient was felt safe to be discharge at home  with outpatient therapy.  Procedures and Results:  none   Consultations:  none  DISCHARGE MEDICATION: Current Discharge Medication List    START taking these medications   Details  folic acid (FOLVITE) 1 MG tablet Take 1 tablet (1 mg total) by mouth daily. Qty: 30 tablet, Refills: 0    nicotine (NICODERM CQ - DOSED IN MG/24 HOURS) 14 mg/24hr patch Place 1 patch (14 mg total) onto the skin daily. Qty: 28 patch, Refills: 0    sodium chloride 1 g tablet Take 1 tablet (1 g total) by mouth 3 (three) times daily with meals. Qty: 10 tablet, Refills: 0      CONTINUE these medications which have CHANGED   Details  ondansetron (ZOFRAN ODT) 4 MG disintegrating tablet Take 1 tablet (4 mg total) by mouth every 8 (eight) hours as needed for nausea or vomiting. Qty: 20 tablet, Refills: 0    pantoprazole (PROTONIX) 40 MG tablet Take 1 tablet (40 mg total) by mouth 2 (two) times daily. Qty: 60 tablet, Refills: 0    thiamine 100 MG tablet Take 1 tablet (100 mg total) by mouth daily. Qty: 30 tablet, Refills: 0    vitamin B-12 1000 MCG tablet Take 1 tablet (1,000 mcg total) by mouth daily. Qty: 30 tablet, Refills: 0      CONTINUE these medications which have NOT CHANGED   Details  albuterol (PROVENTIL,VENTOLIN) 90 MCG/ACT inhaler Inhale 2 puffs into the lungs every 6 (six) hours as needed for wheezing or shortness of breath. Reported on 01/02/2016    budesonide (PULMICORT) 0.25 MG/2ML nebulizer solution Take 2 mLs (0.25 mg total) by nebulization 2 (two) times daily. Qty: 60 mL, Refills: 12    cetirizine (ZYRTEC) 10 MG chewable tablet Chew 10 mg by mouth at bedtime.     fluticasone (FLONASE) 50 MCG/ACT nasal spray Place 1 spray into both nostrils daily.    GLUCOSAMINE-CHONDROITIN PO Take 1 tablet by mouth daily.    Multiple Vitamin (MULTIVITAMIN WITH MINERALS) TABS tablet Take 1 tablet by mouth daily. One a Day Women's    arformoterol (BROVANA) 15 MCG/2ML NEBU Take 2 mLs (15 mcg  total) by nebulization 2 (two) times daily. Qty: 120 mL, Refills: 6      STOP taking these medications     benazepril-hydrochlorthiazide (LOTENSIN HCT) 20-25 MG tablet        Allergies  Allergen Reactions  . Nitrofurantoin Hives  . Other Other (See Comments)    Allergies mold, dust per allergy test  . Sulfonamide Derivatives Other (See Comments)    Unknown allergic reaction per husband    Discharge Instructions    Diet fat modified    Complete by:  As directed    Increase activity slowly    Complete by:  As directed      Discharge Exam: Filed Weights   11/01/16 2159  Weight: 57.6 kg (126 lb 15.8 oz)   Vitals:   11/02/16 0100 11/02/16 0525  BP: 93/66 104/75  Pulse: (!) 108 (!) 101  Resp:  18  Temp:  98.7 F (37.1 C)   General: Appear in mild distress, no Rash; Oral Mucosa moist. Cardiovascular: S1 and S2 Present, no Murmur, no JVD Respiratory: Bilateral Air entry present and Clear to Auscultation, no Crackles, no wheezes Abdomen: Bowel Sound rpesent, Soft and  no tenderness Extremities: no Pedal edema, no calf tenderness Neurology: Grossly no focal neuro deficit.  The results of significant diagnostics from this hospitalization (including imaging, microbiology, ancillary and laboratory) are listed below for reference.    Significant Diagnostic Studies: Dg Chest 2 View  Result Date: 10/30/2016 CLINICAL DATA:  Ankle abuse, anxiety and depression. Asthma and hypertension. Complaints currently of nausea and vomiting. Fall as well as flu last week. EXAM: CHEST  2 VIEW COMPARISON:  10/28/2015 and 09/22/2015 FINDINGS: Lungs are well inflated without focal consolidation or effusion. Mild stable elevation of the left hemidiaphragm. Cardiomediastinal silhouette is within normal. Remaining bones and soft tissues are within normal. IMPRESSION: No active cardiopulmonary disease. Electronically Signed   By: Marin Olp M.D.   On: 10/30/2016 20:15   US Abdomen Limited  Ruq  Result Date: 10/30/2016 CLINICAL DATA:  Elevated lipase. EXAM: US ABDOMEN LIMITED - RIGHT UPPER QUADRANT COMPARISON:  11/12/2015 FINDINGS: Gallbladder: No gallstones or wall thickening visualized. No sonographic Murphy sign noted by sonographer. Common bile duct: Diameter: 5.5 mm Liver: Diffusely increased echogenicity and enlargement of the liver. Accurate measurement could not be obtained as the left lobe of the liver is obscured by bowel gas. Normal direction of flow within the main portal vein. IMPRESSION: Diffusely increased echogenicity and enlargement of the liver, which may be seen with hepatic steatosis or other hepatocellular disease. Electronically Signed   By: Fidela Salisbury M.D.   On: 10/30/2016 18:01    Microbiology: Recent Results (from the past 240 hour(s))  Culture, blood (routine x 2)     Status: None (Preliminary result)   Collection Time: 10/30/16  7:14 PM  Result Value Ref Range Status   Specimen Description BLOOD LEFT FOREARM  Final   Special Requests BOTTLES DRAWN AEROBIC ONLY 10CC  Final   Culture NO GROWTH 2 DAYS  Final   Report Status PENDING  Incomplete  Culture, blood (routine x 2)     Status: None (Preliminary result)   Collection Time: 10/30/16  7:17 PM  Result Value Ref Range Status   Specimen Description BLOOD LEFT HAND  Final   Special Requests IN PEDIATRIC BOTTLE 4CC  Final   Culture NO GROWTH 2 DAYS  Final   Report Status PENDING  Incomplete     Labs: CBC:  Recent Labs Lab 10/30/16 1206 10/31/16 0558 11/01/16 0638 11/02/16 0904  WBC 14.9* 23.0* 18.7* 11.0*  NEUTROABS  --   --  14.8* 7.5  HGB 16.4* 16.7* 14.4 11.8*  HCT 44.9 45.3 39.8 33.6*  MCV 104.7* 104.9* 103.6* 104.0*  PLT 153 128* 100* [id number]*   Basic Metabolic Panel:  Recent Labs Lab 10/30/16 1206 10/31/16 0558 11/01/16 0638 11/02/16 0904  NA 130* 133* 130* 127*  K 3.1* 2.7* 3.1* 3.4*  CL 91* 93* 93* 94*  CO2 12* 20* 24 26  GLUCOSE 194* 148* 179* 181*  BUN 20 13 10 9    CREATININE 1.38* 0.99 0.68 0.73  CALCIUM 9.8 8.5* 8.9 8.5*  MG  --   --  1.5*  --    Liver Function Tests:  Recent Labs Lab 10/30/16 1206 10/31/16 0558 11/01/16 0638 11/02/16 0904  AST 87* 63* 33 29  ALT 123* 69* 36 29  ALKPHOS 51 43 47 46  BILITOT 1.9* 1.3* 0.8 0.6  PROT 7.9 6.4* 5.7* 5.3*  ALBUMIN 5.2* 3.5 2.8* 2.6*    Recent Labs Lab 10/30/16 1206 10/31/16 0800 11/01/16 0638 11/02/16 0536  LIPASE 1,750* 599* 85* 46   Time  spent: 30 minutes  Signed:  Berle Mull  [facility]  11/02/2016  , 2:25 PM

## 2016-11-02 NOTE — Discharge Instructions (Signed)
Low-Fat Diet for Pancreatitis or Gallbladder Conditions A low-fat diet can be helpful if you have pancreatitis or a gallbladder condition. With these conditions, your pancreas and gallbladder have trouble digesting fats. A healthy eating plan with less fat will help rest your pancreas and gallbladder and reduce your symptoms. What do I need to know about this diet?  Eat a low-fat diet. ? Reduce your fat intake to less than 20-30% of your total daily calories. This is less than 50-60 g of fat per day. ? Remember that you need some fat in your diet. Ask your dietician what your daily goal should be. ? Choose nonfat and low-fat healthy foods. Look for the words "nonfat," "low fat," or "fat free." ? As a guide, look on the label and choose foods with less than 3 g of fat per serving. Eat only one serving.  Avoid alcohol.  Do not smoke. If you need help quitting, talk with your health care provider.  Eat small frequent meals instead of three large heavy meals. What foods can I eat? Grains Include healthy grains and starches such as potatoes, wheat bread, fiber-rich cereal, and brown rice. Choose whole grain options whenever possible. In adults, whole grains should account for 45-65% of your daily calories. Fruits and Vegetables Eat plenty of fruits and vegetables. Fresh fruits and vegetables add fiber to your diet. Meats and Other Protein Sources Eat lean meat such as chicken and pork. Trim any fat off of meat before cooking it. Eggs, fish, and beans are other sources of protein. In adults, these foods should account for 10-35% of your daily calories. Dairy Choose low-fat milk and dairy options. Dairy includes fat and protein, as well as calcium. Fats and Oils Limit high-fat foods such as fried foods, sweets, baked goods, sugary drinks. Other Creamy sauces and condiments, such as mayonnaise, can add extra fat. Think about whether or not you need to use them, or use smaller amounts or low fat  options. What foods are not recommended?  High fat foods, such as: ? Baked goods. ? Ice cream. ? French toast. ? Sweet rolls. ? Pizza. ? Cheese bread. ? Foods covered with batter, butter, creamy sauces, or cheese. ? Fried foods. ? Sugary drinks and desserts.  Foods that cause gas or bloating This information is not intended to replace advice given to you by your health care provider. Make sure you discuss any questions you have with your health care provider. Document Released: 09/26/2013 Document Revised: 02/27/2016 Document Reviewed: 09/04/2013 Elsevier Interactive Patient Education  2017 [employer] Inc[employer]  

## 2016-11-02 NOTE — Progress Notes (Signed)
Natalie Morrison to be D/C'd to home per MD order.  Discussed with the patient and all questions fully answered.  VSS, Skin clean, dry and intact without evidence of skin break down, no evidence of skin tears noted. IV catheter discontinued intact. Site without signs and symptoms of complications. Dressing and pressure applied.  An After Visit Summary was printed and given to the patient. Patient received prescription, including prescription for PT/OT outpatient  D/c education completed with patient/family including follow up instructions, medication list, d/c activities limitations if indicated, with other d/c instructions as indicated by MD - patient able to verbalize understanding, all questions fully answered. -  Patient instructed to return to ED, call 911, or call MD for any changes in condition.   Patient escorted via [place], and D/C home via private auto.  Natalie Morrison 11/02/2016 2:40 PM

## 2016-11-04 LAB — CULTURE, BLOOD (ROUTINE X 2)
Culture: NO GROWTH
Culture: NO GROWTH

## 2016-11-09 ENCOUNTER — Ambulatory Visit: Payer: BLUE CROSS/BLUE SHIELD | Admitting: Family Medicine

## 2016-11-13 ENCOUNTER — Telehealth: Payer: Self-pay

## 2016-11-13 NOTE — Telephone Encounter (Signed)
Pt is not TCM due to ins - [employer] with pt to schedule hospital follow up. She states that she is not sure she needs to come in as she is feeling "much better". Advised pt that per d/c notes they recommended she follow up with her PCP. She agreed. Pt scheduled 11/17/16 with Dr Elease Hashimoto. Pt aware. Nothing further needed.

## 2016-11-17 ENCOUNTER — Ambulatory Visit (INDEPENDENT_AMBULATORY_CARE_PROVIDER_SITE_OTHER): Payer: BLUE CROSS/BLUE SHIELD | Admitting: Family Medicine

## 2016-11-17 VITALS — BP 100/80 | HR 109 | Ht 66.0 in | Wt 126.9 lb

## 2016-11-17 DIAGNOSIS — D72829 Elevated white blood cell count, unspecified: Secondary | ICD-10-CM

## 2016-11-17 DIAGNOSIS — E871 Hypo-osmolality and hyponatremia: Secondary | ICD-10-CM | POA: Diagnosis not present

## 2016-11-17 DIAGNOSIS — I10 Essential (primary) hypertension: Secondary | ICD-10-CM

## 2016-11-17 DIAGNOSIS — K859 Acute pancreatitis without necrosis or infection, unspecified: Secondary | ICD-10-CM | POA: Diagnosis not present

## 2016-11-17 NOTE — Progress Notes (Signed)
Subjective:     Patient ID: Natalie Morrison, female   DOB: 1970-12-05, 46 y.o.   MRN: [id number]  HPI Patient seen for hospital follow-up. She was admitted on January 26 with acute pancreatitis. Denies any prior history of pancreatitis. Recent history is that she was seen with respiratory illness and placed on Zithromax. Couple days later she developed new symptoms of epigastric pain along with some nausea and vomiting. She went back to the ER with progressive symptoms and the next day and was admitted with acute pancreatitis with lipase level over 1700. She adamantly denies using any alcohol the week of admission. She does have prior history of alcohol abuse. Her ethanol levels were negative. She had one episode of diarrhea prior to admission but had some increased nausea and vomiting. Her discharge notes state that she was drinking 2-3 drinks per evening but she adamantly denies this.  Ultrasound revealed no gallstones. Chest x-ray unremarkable. Influenza screen negative. Acute kidney injury with creatinine elevated 1.38 but discharge 0.7. She had multiple electrolyte abnormalities with low magnesium, hypokalemia, hyponatremia.  She's done well since discharge. She denies any alcohol whatsoever since then. Appetite is improving. Lotensin was held during his admission because of acute kidney injury. She started this back herself several days ago. Denies any abdominal pain at this time.  Requesting FMLA papers be completed. She was out of work from January 18 through February 5  Ultrasound revealed probably fatty liver changes. No other acute abnormalities. INR 1.02  Past Medical History:  Diagnosis Date  . Alcohol abuse   . Anemia   . Anxiety   . Anxiety and depression   . Asthma   . Depression   . Hepatitis A    "when I was a kid"  . Hypertension    Past Surgical History:  Procedure Laterality Date  . BREAST LUMPECTOMY Right   . TONSILLECTOMY AND ADENOIDECTOMY Bilateral over 30 years ago     reports that she has been smoking Cigarettes.  She has a 16.50 pack-year smoking history. She has never used smokeless tobacco. She reports that she drinks about 4.2 oz of alcohol per week . She reports that she uses drugs, including Marijuana, about 1 time per week. family history includes Heart disease in her father and mother; Liver disease in her mother. Allergies  Allergen Reactions  . Nitrofurantoin Hives  . Other Other (See Comments)    Allergies mold, dust per allergy test  . Sulfonamide Derivatives Other (See Comments)    Unknown allergic reaction per husband      Review of Systems  Constitutional: Negative for fatigue.  Eyes: Negative for visual disturbance.  Respiratory: Negative for cough, chest tightness, shortness of breath and wheezing.   Cardiovascular: Negative for chest pain, palpitations and leg swelling.  Gastrointestinal: Negative for abdominal pain, blood in stool, nausea and vomiting.  Genitourinary: Negative for dysuria.  Neurological: Negative for dizziness, seizures, syncope, weakness, light-headedness and headaches.       Objective:   Physical Exam  Constitutional: She appears well-developed and well-nourished.  HENT:  Mouth/Throat: Oropharynx is clear and moist.  Neck: Neck supple.  Cardiovascular: Normal rate and regular rhythm.   Pulmonary/Chest: Effort normal and breath sounds normal. No respiratory distress. She has no wheezes. She has no rales.  Abdominal: Soft. Bowel sounds are normal. She exhibits no distension and no mass. There is no tenderness. There is no rebound and no guarding.  Musculoskeletal: She exhibits no edema.  Psychiatric: She has a normal  mood and affect. Her behavior is normal.       Assessment:     #1 recent acute pancreatitis. No evidence for gallstones. No history of significant hypertriglyceridemia. Does have history of alcohol abuse but denies any use in the days leading up to her recent admission  #2 hypertension  stable and at goal  #3 ongoing nicotine use  #4 recent acute kidney injury improved with IV fluids  #5 electrolyte abnormalities with low magnesium, low potassium, low sodium probably related to vomiting and poor intake  #6 long history of alcohol abuse. She has drinking some recently but denies in the 1-2 weeks prior to her admission    Plan:     -We had a long discussion regarding pancreatitis and the fact that she would be very high risk for recurrence if she drinks any alcohol. -Recheck labs with CBC and basic metabolic panel along with magnesium level -Recommend multivitamin with thiamine -Follow-up immediately for recurrent abdominal pain, nausea, or vomiting  Eulas Post MD [facility]

## 2016-11-17 NOTE — Patient Instructions (Signed)
Acute Pancreatitis ° °Acute pancreatitis is a condition in which the pancreas suddenly becomes irritated and swollen (has inflammation). The pancreas is a gland that is located behind the stomach. It produces enzymes that help to digest food. The pancreas also releases the hormones glucagon and insulin, which help to regulate blood sugar. Damage to the pancreas occurs when the digestive enzymes from the pancreas are activated before they are released into the intestine. °Most acute attacks last a couple of days and can cause serious problems. Some people become dehydrated and develop low blood pressure. In severe cases, bleeding into the pancreas can lead to shock and can be life-threatening. The lungs, heart, and kidneys may fail. °What are the causes? °The most common causes of this condition are: °· Alcohol abuse. °· Gallstones. °Other causes include: °· Certain medicines. °· Exposure to certain chemicals. °· Infection. °· Damage caused by an accident (trauma). °· Abdominal surgery. °In some cases, the cause may not be known. °What are the signs or symptoms? °Symptoms of this condition include: °· Pain in the upper abdomen that may radiate to the back. °· Tenderness and swelling of the abdomen. °· Nausea and vomiting. °How is this diagnosed? °This condition may be diagnosed based on: °· A physical exam. °· Blood tests. °· Imaging tests, such as X-rays, CT scans, or an ultrasound of the abdomen. °How is this treated? °Treatment for this condition usually requires a stay in the hospital. Treatment may include: °· Pain medicine. °· Fluid replacement through an IV tube. °· Placing a tube in the stomach to remove stomach contents and to control vomiting (NG tube, or nasogastric tube). °· Not eating for 3-4 days. This gives the pancreas a rest, because enzymes are not being produced that can cause further damage. °· Antibiotic medicines, if your condition is caused by an infection. °· Surgery on the pancreas or  gallbladder. °Follow these instructions at home: °Eating and drinking  °· Follow instructions from your health care provider about diet. This may involve avoiding alcohol and decreasing the amount of fat in your diet. °· Eat smaller, more frequent meals. This reduces the amount of digestive fluids that the pancreas produces. °· Drink enough fluid to keep your urine clear or pale yellow. °· Do not drink alcohol if it caused your condition. °General instructions  °· Take over-the-counter and prescription medicines only as told by your health care provider. °· Do not use any tobacco products, such as cigarettes, chewing tobacco, and e-cigarettes. If you need help quitting, ask your health care provider. °· Get plenty of rest. °· If directed, check your blood sugar at home as told by your health care provider. °· Keep all follow-up visits as told by your health care provider. This is important. °Contact a health care provider if: °· You do not recover as quickly as expected. °· You develop new or worsening symptoms. °· You have persistent pain, weakness, or nausea. °· You recover and then have another episode of pain. °· You have a fever. °Get help right away if: °· You cannot eat or keep fluids down. °· Your pain becomes severe. °· Your skin or the white part of your eyes turns yellow (jaundice). °· You vomit. °· You feel dizzy or you faint. °· Your blood sugar is high (over 300 mg/dL). °This information is not intended to replace advice given to you by your health care provider. Make sure you discuss any questions you have with your health care provider. °Document Released: 09/21/2005 Document   Revised: 01/29/2016 Document Reviewed: 06/25/2015 °Elsevier Interactive Patient Education © 2017 [employer] ° °

## 2016-11-17 NOTE — Progress Notes (Signed)
Pre visit review using our clinic review tool, if applicable. No additional management support is needed unless otherwise documented below in the visit note. 

## 2016-11-18 LAB — BASIC METABOLIC PANEL
BUN: 12 mg/dL (ref 6–23)
CO2: 27 mEq/L (ref 19–32)
Calcium: 9.4 mg/dL (ref 8.4–10.5)
Chloride: 104 mEq/L (ref 96–112)
Creatinine, Ser: 0.69 mg/dL (ref 0.40–1.20)
GFR: 97 mL/min (ref >60.00)
Glucose, Bld: 108 mg/dL — ABNORMAL HIGH (ref 70–99)
Potassium: 4.1 mEq/L (ref 3.5–5.1)
Sodium: 136 mEq/L (ref 135–145)

## 2016-11-18 LAB — CBC WITH DIFFERENTIAL/PLATELET
Basophils Absolute: 0.1 10*3/uL (ref 0.0–0.1)
Basophils Relative: 1.2 % (ref 0.0–3.0)
Eosinophils Absolute: 0.2 10*3/uL (ref 0.0–0.7)
Eosinophils Relative: 1.9 % (ref 0.0–5.0)
HCT: 37.4 % (ref 36.0–46.0)
Hemoglobin: 12.9 g/dL (ref 12.0–15.0)
Lymphocytes Relative: 31.8 % (ref 12.0–46.0)
Lymphs Abs: 2.6 10*3/uL (ref 0.7–4.0)
MCHC: 34.4 g/dL (ref 30.0–36.0)
MCV: 110.9 fl — ABNORMAL HIGH (ref 78.0–100.0)
Monocytes Absolute: 1 10*3/uL (ref 0.1–1.0)
Monocytes Relative: 12.2 % — ABNORMAL HIGH (ref 3.0–12.0)
Neutro Abs: 4.3 10*3/uL (ref 1.4–7.7)
Neutrophils Relative %: 52.9 % (ref 43.0–77.0)
Platelets: 306 10*3/uL (ref 150.0–400.0)
RBC: 3.34 Mil/uL — ABNORMAL LOW (ref 3.87–5.11)
RDW: 13.2 % (ref 11.5–15.5)
WBC: 8.1 10*3/uL (ref 4.0–10.5)

## 2016-11-18 LAB — MAGNESIUM: Magnesium: 1.4 mg/dL — ABNORMAL LOW (ref 1.5–2.5)

## 2017-06-23 ENCOUNTER — Encounter: Payer: Self-pay | Admitting: Family Medicine

## 2017-06-23 ENCOUNTER — Ambulatory Visit (INDEPENDENT_AMBULATORY_CARE_PROVIDER_SITE_OTHER): Payer: BLUE CROSS/BLUE SHIELD | Admitting: Family Medicine

## 2017-06-23 VITALS — BP 90/64 | HR 94 | Temp 97.4°F | Wt 125.3 lb

## 2017-06-23 DIAGNOSIS — I959 Hypotension, unspecified: Secondary | ICD-10-CM

## 2017-06-23 DIAGNOSIS — R5383 Other fatigue: Secondary | ICD-10-CM

## 2017-06-23 DIAGNOSIS — R531 Weakness: Secondary | ICD-10-CM | POA: Diagnosis not present

## 2017-06-23 DIAGNOSIS — E871 Hypo-osmolality and hyponatremia: Secondary | ICD-10-CM

## 2017-06-23 DIAGNOSIS — F1010 Alcohol abuse, uncomplicated: Secondary | ICD-10-CM

## 2017-06-23 LAB — CBC WITH DIFFERENTIAL/PLATELET
Basophils Absolute: 0.1 10*3/uL (ref 0.0–0.1)
Basophils Relative: 0.8 % (ref 0.0–3.0)
Eosinophils Absolute: 0 10*3/uL (ref 0.0–0.7)
Eosinophils Relative: 0.3 % (ref 0.0–5.0)
HCT: 42.8 % (ref 36.0–46.0)
Hemoglobin: 14.7 g/dL (ref 12.0–15.0)
Lymphocytes Relative: 23 % (ref 12.0–46.0)
Lymphs Abs: 1.6 10*3/uL (ref 0.7–4.0)
MCHC: 34.4 g/dL (ref 30.0–36.0)
MCV: 111.6 fl — ABNORMAL HIGH (ref 78.0–100.0)
Monocytes Absolute: 0.5 10*3/uL (ref 0.1–1.0)
Monocytes Relative: 7.4 % (ref 3.0–12.0)
Neutro Abs: 4.9 10*3/uL (ref 1.4–7.7)
Neutrophils Relative %: 68.5 % (ref 43.0–77.0)
Platelets: 211 10*3/uL (ref 150.0–400.0)
RBC: 3.84 Mil/uL — ABNORMAL LOW (ref 3.87–5.11)
RDW: 12.7 % (ref 11.5–15.5)
WBC: 7.1 10*3/uL (ref 4.0–10.5)

## 2017-06-23 LAB — BASIC METABOLIC PANEL
BUN: 29 mg/dL — ABNORMAL HIGH (ref 6–23)
CO2: 20 mEq/L (ref 19–32)
Calcium: 9.8 mg/dL (ref 8.4–10.5)
Chloride: 92 mEq/L — ABNORMAL LOW (ref 96–112)
Creatinine, Ser: 1.36 mg/dL — ABNORMAL HIGH (ref 0.40–1.20)
GFR: 44.22 mL/min — ABNORMAL LOW (ref >60.00)
Glucose, Bld: 54 mg/dL — ABNORMAL LOW (ref 70–99)
Potassium: 4.8 mEq/L (ref 3.5–5.1)
Sodium: 132 mEq/L — ABNORMAL LOW (ref 135–145)

## 2017-06-23 LAB — MAGNESIUM: Magnesium: 1.8 mg/dL (ref 1.5–2.5)

## 2017-06-23 LAB — HEPATIC FUNCTION PANEL
ALT: 81 U/L — ABNORMAL HIGH (ref 0–35)
AST: 102 U/L — ABNORMAL HIGH (ref 0–37)
Albumin: 4.7 g/dL (ref 3.5–5.2)
Alkaline Phosphatase: 54 U/L (ref 39–117)
Bilirubin, Direct: 0.2 mg/dL (ref 0.0–0.3)
Total Bilirubin: 0.5 mg/dL (ref 0.2–1.2)
Total Protein: 7.1 g/dL (ref 6.0–8.3)

## 2017-06-23 NOTE — Patient Instructions (Signed)
Hold the BP medication for now.

## 2017-06-23 NOTE — Progress Notes (Signed)
Subjective:     Patient ID: Natalie Morrison, female   DOB: 12-Apr-1971, 46 y.o.   MRN: [id number]  HPI Patient has chronic problems including long-standing history of alcohol abuse, nicotine use, hypertension, history of pancreatitis. She is seen with rather acute onset couple days ago of some chills possible subjective fever and cough along with sore throat. She's had some minimal nasal congestion.   She states that she had some urinary symptoms couple weeks ago and was seen by some type of healthcare provider at her employer and was treated with 10 days of Cipro just finished that on Sunday. She denies any dysuria at this time. Cough nonproductive. No hemoptysis.  Denies any abdominal pain. No nausea or vomiting. No diarrhea. Cough mostly dry. She complains of generalized fatigue and weakness. She's had prior history of hyponatremia and hypomagnesemia probably related her alcohol abuse.  We inquired regarding alcohol use and she was deflected and would not give a definite answer regarding current usage.  She has history of hypertension but low blood pressure today of 90/64.  We had taken her off blood pressure medications last year but apparently she started herself back on benazepril HCTZ. She denies any consistent orthostatic symptoms. Not monitoring blood pressure at home. She states that when she stops medication she has recurrent leg swelling bilaterally  Past Medical History:  Diagnosis Date  . Alcohol abuse   . Anemia   . Anxiety   . Anxiety and depression   . Asthma   . Depression   . Hepatitis A    "when I was a kid"  . Hypertension    Past Surgical History:  Procedure Laterality Date  . BREAST LUMPECTOMY Right   . TONSILLECTOMY AND ADENOIDECTOMY Bilateral over 30 years ago    reports that she has been smoking Cigarettes.  She has a 16.50 pack-year smoking history. She has never used smokeless tobacco. She reports that she drinks about 4.2 oz of alcohol per week . She reports  that she uses drugs, including Marijuana, about 1 time per week. family history includes Heart disease in her father and mother; Liver disease in her mother. Allergies  Allergen Reactions  . Nitrofurantoin Hives  . Other Other (See Comments)    Allergies mold, dust per allergy test  . Sulfonamide Derivatives Other (See Comments)    Unknown allergic reaction per husband      Review of Systems  Constitutional: Positive for chills and fatigue.  HENT: Positive for congestion and sore throat.   Respiratory: Positive for cough.   Cardiovascular: Negative for chest pain.  Gastrointestinal: Negative for abdominal pain, diarrhea, nausea and vomiting.  Genitourinary: Negative for dysuria.  Neurological: Negative for dizziness and syncope.  Hematological: Negative for adenopathy.  Psychiatric/Behavioral: Negative for confusion.       Objective:   Physical Exam  Constitutional: She is oriented to person, place, and time. She appears well-developed and well-nourished.  HENT:  Right Ear: External ear normal.  Left Ear: External ear normal.  Mouth/Throat: Oropharynx is clear and moist.  Neck: Neck supple.  Cardiovascular: Normal rate.   Pulmonary/Chest: Effort normal and breath sounds normal. No respiratory distress. She has no wheezes. She has no rales.  Musculoskeletal: She exhibits no edema.  Lymphadenopathy:    She has no cervical adenopathy.  Neurological: She is alert and oriented to person, place, and time.  Skin: No rash noted.       Assessment:     Patient has chronic problems including history  of tobacco use and alcohol abuse who presents with some generalized weakness along with upper respiratory symptoms of chills, cough, sore throat, nasal congestion. Suspect viral process but she is high risk for complications. She is in no respiratory distress and currently afebrile    Plan:     -Check labs with CBC, basic met panel, hepatic panel, magnesium level -Hold blood  pressure medication at this time with blood pressure reading as above -Stay well hydrated -Avoid alcohol  Eulas Post MD [facility] at [facility]

## 2017-07-07 ENCOUNTER — Encounter: Payer: Self-pay | Admitting: Family Medicine

## 2017-07-07 ENCOUNTER — Ambulatory Visit (INDEPENDENT_AMBULATORY_CARE_PROVIDER_SITE_OTHER): Payer: BLUE CROSS/BLUE SHIELD | Admitting: Family Medicine

## 2017-07-07 VITALS — BP 120/78 | HR 99 | Temp 98.4°F | Wt 133.1 lb

## 2017-07-07 DIAGNOSIS — E871 Hypo-osmolality and hyponatremia: Secondary | ICD-10-CM

## 2017-07-07 DIAGNOSIS — N179 Acute kidney failure, unspecified: Secondary | ICD-10-CM

## 2017-07-07 DIAGNOSIS — F1010 Alcohol abuse, uncomplicated: Secondary | ICD-10-CM | POA: Diagnosis not present

## 2017-07-07 DIAGNOSIS — I10 Essential (primary) hypertension: Secondary | ICD-10-CM | POA: Diagnosis not present

## 2017-07-07 NOTE — Patient Instructions (Signed)
Monitor blood pressure and be in touch if consistently > 130/80 Stay off the Benazepril otherwise.

## 2017-07-07 NOTE — Progress Notes (Signed)
Subjective:     Patient ID: Natalie Morrison, female   DOB: 08-Jun-1971, 46 y.o.   MRN: [id number]  HPI Patient here to follow-up regarding recent abnormal lab work. She has long history of alcohol abuse and also hypertension. She came in with what sounded like viral illness and had low blood pressure and labs came back significant for sodium 132 with creatinine 1.36 which is increased over her baseline around 0.69. Potassium was normal. White blood count normal. Hemoglobin 14.7. MCV 111. She was instructed to stop her benazepril and increase nonalcoholic beverage for hydration and follow-up for recheck today. Her blood pressure is much improved today. She feels much better overall. Denies any nausea or vomiting. No fever. No abdominal pain.  Past Medical History:  Diagnosis Date  . Alcohol abuse   . Anemia   . Anxiety   . Anxiety and depression   . Asthma   . Depression   . Hepatitis A    "when I was a kid"  . Hypertension    Past Surgical History:  Procedure Laterality Date  . BREAST LUMPECTOMY Right   . TONSILLECTOMY AND ADENOIDECTOMY Bilateral over 30 years ago    reports that she has been smoking Cigarettes.  She has a 16.50 pack-year smoking history. She has never used smokeless tobacco. She reports that she drinks about 4.2 oz of alcohol per week . She reports that she uses drugs, including Marijuana, about 1 time per week. family history includes Heart disease in her father and mother; Liver disease in her mother. Allergies  Allergen Reactions  . Nitrofurantoin Hives  . Other Other (See Comments)    Allergies mold, dust per allergy test  . Sulfonamide Derivatives Other (See Comments)    Unknown allergic reaction per husband      Review of Systems  Constitutional: Negative for appetite change, chills, fever and unexpected weight change.  Respiratory: Negative for shortness of breath.   Cardiovascular: Negative for chest pain.  Gastrointestinal: Negative for abdominal pain,  diarrhea, nausea and vomiting.  Genitourinary: Negative for dysuria.  Neurological: Negative for dizziness and weakness.  Psychiatric/Behavioral: Negative for confusion.       Objective:   Physical Exam  Constitutional: She appears well-developed and well-nourished.  HENT:  Mouth/Throat: Oropharynx is clear and moist.  Neck: Neck supple.  Cardiovascular: Normal rate.   Pulmonary/Chest: Effort normal and breath sounds normal. No respiratory distress. She has no wheezes. She has no rales.  Musculoskeletal: She exhibits no edema.  Lymphadenopathy:    She has no cervical adenopathy.       Assessment:     #1 recent acute kidney injury probably related to dehydration with superimposed ACE inhibitor use  #2 history of hypertension currently stable off ACE inhibitor  #3 long-standing history of alcohol abuse    Plan:     -Recheck basic metabolic panel -Continue to hold ACE inhibitor if blood pressure staying consistently less than 130/80 -Long discussion with patient regarding our concerns for her alcohol abuse. She declines treatment/rehab help at this point. She is aware of long-term complications of ongoing alcohol abuse  Eulas Post MD [facility] at [facility]

## 2017-07-08 LAB — BASIC METABOLIC PANEL
BUN: 10 mg/dL (ref 6–23)
CO2: 26 mEq/L (ref 19–32)
Calcium: 9.6 mg/dL (ref 8.4–10.5)
Chloride: 105 mEq/L (ref 96–112)
Creatinine, Ser: 0.69 mg/dL (ref 0.40–1.20)
GFR: 96.74 mL/min (ref >60.00)
Glucose, Bld: 108 mg/dL — ABNORMAL HIGH (ref 70–99)
Potassium: 4.1 mEq/L (ref 3.5–5.1)
Sodium: 140 mEq/L (ref 135–145)

## 2017-07-09 ENCOUNTER — Telehealth: Payer: Self-pay | Admitting: Family Medicine

## 2017-07-09 NOTE — Telephone Encounter (Signed)
Placed on Dr Burchette's desk 

## 2017-07-09 NOTE — Telephone Encounter (Signed)
Natalie Morrison  Spouse   Dropped off FMLA forms to be filled out. Call when ready. Placed in [place] folder.

## 2017-07-12 ENCOUNTER — Other Ambulatory Visit: Payer: Self-pay | Admitting: Emergency Medicine

## 2017-07-12 MED ORDER — FUROSEMIDE 20 MG PO TABS: ORAL_TABLET | ORAL | 0 refills | Status: DC

## 2017-07-15 DIAGNOSIS — Z0289 Encounter for other administrative examinations: Secondary | ICD-10-CM

## 2017-08-21 ENCOUNTER — Other Ambulatory Visit: Payer: Self-pay | Admitting: Family Medicine

## 2017-09-10 ENCOUNTER — Other Ambulatory Visit: Payer: Self-pay | Admitting: Family Medicine

## 2017-10-12 ENCOUNTER — Telehealth: Payer: Self-pay | Admitting: Family Medicine

## 2017-10-12 NOTE — Telephone Encounter (Signed)
Spoke with patient and recommended the CDC website for travel.  Patient will call back if needed.

## 2017-10-12 NOTE — Telephone Encounter (Signed)
Copied from [place] [phone]. Topic: Inquiry >> Oct 12, 2017  8:50 AM Malena Catholic I, NT wrote: Reason for CRM: Pt call she need a bust shot because [place] and do not want to get sick

## 2017-12-03 ENCOUNTER — Other Ambulatory Visit: Payer: Self-pay | Admitting: Family Medicine

## 2018-01-26 ENCOUNTER — Encounter: Payer: Self-pay | Admitting: Family Medicine

## 2018-01-26 ENCOUNTER — Ambulatory Visit: Payer: BLUE CROSS/BLUE SHIELD | Admitting: Family Medicine

## 2018-01-26 VITALS — BP 94/56 | HR 102 | Temp 98.3°F | Ht 66.0 in | Wt 130.7 lb

## 2018-01-26 DIAGNOSIS — J0100 Acute maxillary sinusitis, unspecified: Secondary | ICD-10-CM

## 2018-01-26 DIAGNOSIS — R059 Cough, unspecified: Secondary | ICD-10-CM

## 2018-01-26 DIAGNOSIS — R5383 Other fatigue: Secondary | ICD-10-CM

## 2018-01-26 DIAGNOSIS — R05 Cough: Secondary | ICD-10-CM

## 2018-01-26 DIAGNOSIS — Z720 Tobacco use: Secondary | ICD-10-CM

## 2018-01-26 MED ORDER — AMOXICILLIN-POT CLAVULANATE 875-125 MG PO TABS: 1.0000 | ORAL_TABLET | Freq: Two times a day (BID) | ORAL | 0 refills | Status: DC

## 2018-01-26 NOTE — Patient Instructions (Signed)
Go for CXR tomorrow as discussed.  Start the antibiotic tonight  Follow up promptly for any fever or increased shortness of breath.

## 2018-01-26 NOTE — Progress Notes (Signed)
Subjective:     Patient ID: Natalie Morrison, female   DOB: January 30, 1971, 47 y.o.   MRN: [id number]  HPI Patient seen with increased sinus congestion and some coughing for almost a month now. [facility] and was treated with 7 days of Augmentin. She felt slightly better when taking that. Since stopping that she's had some progressive cough which has been dry and nonproductive. No hemoptysis. Does have some left and right maxillary facial pain. Denies any fever or chills but has not taken her temperature. She occasionally feels "hot ". Denies any dyspnea at rest or with basic activities such as walking.  She does smoke less than half pack cigarettes per day. She has albuterol which she's tried a couple times without improvement. She's not feel she is wheezing. She went to health provider at her work last week and was given 5 days of prednisone which she is finishing today. She does not think this has helped. No nausea or vomiting. No extremity edema.  Long standing hx of alcohol abuse.  Denies any recent known aspiration events.  Past Medical History:  Diagnosis Date  . Alcohol abuse   . Anemia   . Anxiety   . Anxiety and depression   . Asthma   . Depression   . Hepatitis A    "when I was a kid"  . Hypertension    Past Surgical History:  Procedure Laterality Date  . BREAST LUMPECTOMY Right   . TONSILLECTOMY AND ADENOIDECTOMY Bilateral over 30 years ago    reports that she has been smoking cigarettes.  She has a 16.50 pack-year smoking history. She has never used smokeless tobacco. She reports that she drinks about 4.2 oz of alcohol per week. She reports that she has current or past drug history. Drug: Marijuana. Frequency: 1.00 time per week. family history includes Heart disease in her father and mother; Liver disease in her mother. Allergies  Allergen Reactions  . Nitrofurantoin Hives  . Other Other (See Comments)    Allergies mold,  dust per allergy test  . Sulfonamide Derivatives Other (See Comments)    Unknown allergic reaction per husband      Review of Systems  Constitutional: Positive for fatigue. Negative for chills and fever.  HENT: Positive for congestion, sinus pressure and sinus pain. Negative for sore throat.   Respiratory: Positive for cough. Negative for wheezing.   Cardiovascular: Negative for chest pain, palpitations and leg swelling.       Objective:   Physical Exam  Constitutional: She appears well-developed and well-nourished.  Cardiovascular: Normal rate and regular rhythm.  Pulmonary/Chest: Effort normal. No respiratory distress. She has no wheezes.  Patient has some crackles and rales left anterior upper lobe. No wheezes. No retractions. Normal respiratory rate. Her pulse oximetry did read low between 84-88% but her fingers were fairly cool to touch-?accuracy.       Assessment:     Patient presents with almost 1 month history of cough and some persistent sinusitis symptoms. She does have some crackles in her left anterior upper lobe but is afebrile and in no respiratory distress.  Ambulating without difficulty.    Plan:     -Obtain chest x-ray to further evaluate-she will go tomorrow to another site for that as a walk in -Were unable to get labs as she was here after 5:30 PM but consider CBC and other labs if not improving soon -Start Augmentin 875 mg twice  daily for 10 days -follow up immediately for any fever, dyspnea, vomiting, or other concerns. -she is encouraged to stop smoking. - we did not extend her steroids as no wheezing noted on exam today.    Eulas Post MD [facility] at [facility]

## 2018-01-27 ENCOUNTER — Encounter (HOSPITAL_COMMUNITY): Payer: Self-pay | Admitting: Emergency Medicine

## 2018-01-27 ENCOUNTER — Emergency Department (HOSPITAL_COMMUNITY): Payer: BLUE CROSS/BLUE SHIELD

## 2018-01-27 ENCOUNTER — Inpatient Hospital Stay (HOSPITAL_COMMUNITY)
Admission: EM | Admit: 2018-01-27 | Discharge: 2018-02-16 | DRG: 853 | Disposition: A | Discharge status: Home Health | Payer: BLUE CROSS/BLUE SHIELD | Attending: Internal Medicine | Admitting: Internal Medicine

## 2018-01-27 ENCOUNTER — Inpatient Hospital Stay (HOSPITAL_COMMUNITY): Payer: BLUE CROSS/BLUE SHIELD

## 2018-01-27 ENCOUNTER — Telehealth: Payer: Self-pay | Admitting: Family Medicine

## 2018-01-27 DIAGNOSIS — K5900 Constipation, unspecified: Secondary | ICD-10-CM | POA: Diagnosis not present

## 2018-01-27 DIAGNOSIS — D7589 Other specified diseases of blood and blood-forming organs: Secondary | ICD-10-CM | POA: Diagnosis present

## 2018-01-27 DIAGNOSIS — F17210 Nicotine dependence, cigarettes, uncomplicated: Secondary | ICD-10-CM | POA: Diagnosis present

## 2018-01-27 DIAGNOSIS — I952 Hypotension due to drugs: Secondary | ICD-10-CM | POA: Diagnosis not present

## 2018-01-27 DIAGNOSIS — J810 Acute pulmonary edema: Secondary | ICD-10-CM | POA: Diagnosis present

## 2018-01-27 DIAGNOSIS — J209 Acute bronchitis, unspecified: Secondary | ICD-10-CM | POA: Diagnosis present

## 2018-01-27 DIAGNOSIS — N390 Urinary tract infection, site not specified: Secondary | ICD-10-CM | POA: Diagnosis present

## 2018-01-27 DIAGNOSIS — J384 Edema of larynx: Secondary | ICD-10-CM | POA: Diagnosis present

## 2018-01-27 DIAGNOSIS — E872 Acidosis: Secondary | ICD-10-CM | POA: Diagnosis present

## 2018-01-27 DIAGNOSIS — F1010 Alcohol abuse, uncomplicated: Secondary | ICD-10-CM | POA: Diagnosis present

## 2018-01-27 DIAGNOSIS — H1089 Other conjunctivitis: Secondary | ICD-10-CM | POA: Diagnosis present

## 2018-01-27 DIAGNOSIS — Z781 Physical restraint status: Secondary | ICD-10-CM | POA: Diagnosis not present

## 2018-01-27 DIAGNOSIS — Z8249 Family history of ischemic heart disease and other diseases of the circulatory system: Secondary | ICD-10-CM

## 2018-01-27 DIAGNOSIS — E441 Mild protein-calorie malnutrition: Secondary | ICD-10-CM | POA: Diagnosis present

## 2018-01-27 DIAGNOSIS — J189 Pneumonia, unspecified organism: Secondary | ICD-10-CM | POA: Diagnosis not present

## 2018-01-27 DIAGNOSIS — Z79899 Other long term (current) drug therapy: Secondary | ICD-10-CM

## 2018-01-27 DIAGNOSIS — Y95 Nosocomial condition: Secondary | ICD-10-CM | POA: Diagnosis present

## 2018-01-27 DIAGNOSIS — Z7141 Alcohol abuse counseling and surveillance of alcoholic: Secondary | ICD-10-CM

## 2018-01-27 DIAGNOSIS — R0602 Shortness of breath: Secondary | ICD-10-CM

## 2018-01-27 DIAGNOSIS — D649 Anemia, unspecified: Secondary | ICD-10-CM | POA: Diagnosis present

## 2018-01-27 DIAGNOSIS — Z978 Presence of other specified devices: Secondary | ICD-10-CM

## 2018-01-27 DIAGNOSIS — Z882 Allergy status to sulfonamides status: Secondary | ICD-10-CM

## 2018-01-27 DIAGNOSIS — J9601 Acute respiratory failure with hypoxia: Secondary | ICD-10-CM | POA: Diagnosis present

## 2018-01-27 DIAGNOSIS — F419 Anxiety disorder, unspecified: Secondary | ICD-10-CM | POA: Diagnosis present

## 2018-01-27 DIAGNOSIS — Y92230 Patient room in hospital as the place of occurrence of the external cause: Secondary | ICD-10-CM | POA: Diagnosis not present

## 2018-01-27 DIAGNOSIS — A419 Sepsis, unspecified organism: Secondary | ICD-10-CM | POA: Diagnosis not present

## 2018-01-27 DIAGNOSIS — R739 Hyperglycemia, unspecified: Secondary | ICD-10-CM | POA: Diagnosis not present

## 2018-01-27 DIAGNOSIS — J80 Acute respiratory distress syndrome: Secondary | ICD-10-CM | POA: Diagnosis present

## 2018-01-27 DIAGNOSIS — T4275XA Adverse effect of unspecified antiepileptic and sedative-hypnotic drugs, initial encounter: Secondary | ICD-10-CM | POA: Diagnosis not present

## 2018-01-27 DIAGNOSIS — R9431 Abnormal electrocardiogram [ECG] [EKG]: Secondary | ICD-10-CM | POA: Diagnosis not present

## 2018-01-27 DIAGNOSIS — Z789 Other specified health status: Secondary | ICD-10-CM | POA: Diagnosis not present

## 2018-01-27 DIAGNOSIS — Z6825 Body mass index (BMI) 25.0-25.9, adult: Secondary | ICD-10-CM | POA: Diagnosis not present

## 2018-01-27 DIAGNOSIS — J180 Bronchopneumonia, unspecified organism: Secondary | ICD-10-CM | POA: Diagnosis present

## 2018-01-27 DIAGNOSIS — F10231 Alcohol dependence with withdrawal delirium: Secondary | ICD-10-CM | POA: Diagnosis present

## 2018-01-27 DIAGNOSIS — I10 Essential (primary) hypertension: Secondary | ICD-10-CM | POA: Diagnosis present

## 2018-01-27 DIAGNOSIS — R4702 Dysphasia: Secondary | ICD-10-CM

## 2018-01-27 DIAGNOSIS — J9602 Acute respiratory failure with hypercapnia: Secondary | ICD-10-CM

## 2018-01-27 DIAGNOSIS — G9349 Other encephalopathy: Secondary | ICD-10-CM | POA: Diagnosis present

## 2018-01-27 DIAGNOSIS — E876 Hypokalemia: Secondary | ICD-10-CM | POA: Diagnosis not present

## 2018-01-27 DIAGNOSIS — R5381 Other malaise: Secondary | ICD-10-CM | POA: Diagnosis not present

## 2018-01-27 DIAGNOSIS — G92 Toxic encephalopathy: Secondary | ICD-10-CM | POA: Diagnosis not present

## 2018-01-27 DIAGNOSIS — Z4659 Encounter for fitting and adjustment of other gastrointestinal appliance and device: Secondary | ICD-10-CM

## 2018-01-27 DIAGNOSIS — F329 Major depressive disorder, single episode, unspecified: Secondary | ICD-10-CM | POA: Diagnosis present

## 2018-01-27 DIAGNOSIS — T17990A Other foreign object in respiratory tract, part unspecified in causing asphyxiation, initial encounter: Secondary | ICD-10-CM | POA: Diagnosis not present

## 2018-01-27 DIAGNOSIS — Z9289 Personal history of other medical treatment: Secondary | ICD-10-CM

## 2018-01-27 DIAGNOSIS — J45909 Unspecified asthma, uncomplicated: Secondary | ICD-10-CM | POA: Diagnosis present

## 2018-01-27 DIAGNOSIS — R0902 Hypoxemia: Secondary | ICD-10-CM

## 2018-01-27 DIAGNOSIS — Z888 Allergy status to other drugs, medicaments and biological substances status: Secondary | ICD-10-CM

## 2018-01-27 LAB — CBC WITH DIFFERENTIAL/PLATELET
Basophils Absolute: 0 10*3/uL (ref 0.0–0.1)
Basophils Absolute: 0 10*3/uL (ref 0.0–0.1)
Basophils Relative: 0 %
Basophils Relative: 0 %
Eosinophils Absolute: 0.3 10*3/uL (ref 0.0–0.7)
Eosinophils Absolute: 0.3 10*3/uL (ref 0.0–0.7)
Eosinophils Relative: 1 %
Eosinophils Relative: 1 %
HCT: 39.3 % (ref 36.0–46.0)
HCT: 37.5 % (ref 36.0–46.0)
Hemoglobin: 13.3 g/dL (ref 12.0–15.0)
Hemoglobin: 12.4 g/dL (ref 12.0–15.0)
Lymphocytes Relative: 7 %
Lymphocytes Relative: 6 %
Lymphs Abs: 2.1 10*3/uL (ref 0.7–4.0)
Lymphs Abs: 2 10*3/uL (ref 0.7–4.0)
MCH: 35.8 pg — ABNORMAL HIGH (ref 26.0–34.0)
MCH: 36 pg — ABNORMAL HIGH (ref 26.0–34.0)
MCHC: 33.8 g/dL (ref 30.0–36.0)
MCHC: 33.1 g/dL (ref 30.0–36.0)
MCV: 105.9 fL — ABNORMAL HIGH (ref 78.0–100.0)
MCV: 109 fL — ABNORMAL HIGH (ref 78.0–100.0)
Monocytes Absolute: 1.5 10*3/uL — ABNORMAL HIGH (ref 0.1–1.0)
Monocytes Absolute: 1.3 10*3/uL — ABNORMAL HIGH (ref 0.1–1.0)
Monocytes Relative: 5 %
Monocytes Relative: 4 %
Neutro Abs: 26.5 10*3/uL — ABNORMAL HIGH (ref 1.7–7.7)
Neutro Abs: 29.2 10*3/uL — ABNORMAL HIGH (ref 1.7–7.7)
Neutrophils Relative %: 87 %
Neutrophils Relative %: 89 %
Platelets: 396 10*3/uL (ref 150–400)
Platelets: 374 10*3/uL (ref 150–400)
RBC: 3.71 MIL/uL — ABNORMAL LOW (ref 3.87–5.11)
RBC: 3.44 MIL/uL — ABNORMAL LOW (ref 3.87–5.11)
RDW: 12.3 % (ref 11.5–15.5)
RDW: 12.8 % (ref 11.5–15.5)
WBC: 30.4 10*3/uL — ABNORMAL HIGH (ref 4.0–10.5)
WBC: 32.8 10*3/uL — ABNORMAL HIGH (ref 4.0–10.5)

## 2018-01-27 LAB — I-STAT ARTERIAL BLOOD GAS, ED
Acid-base deficit: 7 mmol/L — ABNORMAL HIGH (ref 0.0–2.0)
Acid-base deficit: 7 mmol/L — ABNORMAL HIGH (ref 0.0–2.0)
Acid-base deficit: 7 mmol/L — ABNORMAL HIGH (ref 0.0–2.0)
Acid-base deficit: 4 mmol/L — ABNORMAL HIGH (ref 0.0–2.0)
Bicarbonate: 20.5 mmol/L (ref 20.0–28.0)
Bicarbonate: 21.3 mmol/L (ref 20.0–28.0)
Bicarbonate: 22.2 mmol/L (ref 20.0–28.0)
Bicarbonate: 23.6 mmol/L (ref 20.0–28.0)
O2 Saturation: 96 %
O2 Saturation: 96 %
O2 Saturation: 100 %
O2 Saturation: 98 %
Patient temperature: 98.8
Patient temperature: 98.8
Patient temperature: 98.8
Patient temperature: 37.2
TCO2: 22 mmol/L (ref 22–32)
TCO2: 23 mmol/L (ref 22–32)
TCO2: 24 mmol/L (ref 22–32)
TCO2: 25 mmol/L (ref 22–32)
pCO2 arterial: 49 mmHg — ABNORMAL HIGH (ref 32.0–48.0)
pCO2 arterial: 57.5 mmHg — ABNORMAL HIGH (ref 32.0–48.0)
pCO2 arterial: 63.1 mmHg — ABNORMAL HIGH (ref 32.0–48.0)
pCO2 arterial: 55.8 mmHg — ABNORMAL HIGH (ref 32.0–48.0)
pH, Arterial: 7.23 — ABNORMAL LOW (ref 7.350–7.450)
pH, Arterial: 7.178 — CL (ref 7.350–7.450)
pH, Arterial: 7.154 — CL (ref 7.350–7.450)
pH, Arterial: 7.234 — ABNORMAL LOW (ref 7.350–7.450)
pO2, Arterial: 97 mmHg (ref 83.0–108.0)
pO2, Arterial: 102 mmHg (ref 83.0–108.0)
pO2, Arterial: 278 mmHg — ABNORMAL HIGH (ref 83.0–108.0)
pO2, Arterial: 136 mmHg — ABNORMAL HIGH (ref 83.0–108.0)

## 2018-01-27 LAB — MRSA PCR SCREENING: MRSA by PCR: NEGATIVE

## 2018-01-27 LAB — GLUCOSE, CAPILLARY
Glucose-Capillary: 107 mg/dL — ABNORMAL HIGH (ref 65–99)
Glucose-Capillary: 111 mg/dL — ABNORMAL HIGH (ref 65–99)
Glucose-Capillary: 118 mg/dL — ABNORMAL HIGH (ref 65–99)
Glucose-Capillary: 84 mg/dL (ref 65–99)
Glucose-Capillary: 86 mg/dL (ref 65–99)

## 2018-01-27 LAB — COMPREHENSIVE METABOLIC PANEL
ALT: 17 U/L (ref 14–54)
AST: 27 U/L (ref 15–41)
Albumin: 2.5 g/dL — ABNORMAL LOW (ref 3.5–5.0)
Alkaline Phosphatase: 115 U/L (ref 38–126)
Anion gap: 18 — ABNORMAL HIGH (ref 5–15)
BUN: 14 mg/dL (ref 6–20)
CO2: 19 mmol/L — ABNORMAL LOW (ref 22–32)
Calcium: 8.6 mg/dL — ABNORMAL LOW (ref 8.9–10.3)
Chloride: 98 mmol/L — ABNORMAL LOW (ref 101–111)
Creatinine, Ser: 0.68 mg/dL (ref 0.44–1.00)
GFR calc Af Amer: >60 mL/min (ref >60)
GFR calc non Af Amer: >60 mL/min (ref >60)
Glucose, Bld: 118 mg/dL — ABNORMAL HIGH (ref 65–99)
Potassium: 2.8 mmol/L — ABNORMAL LOW (ref 3.5–5.1)
Sodium: 135 mmol/L (ref 135–145)
Total Bilirubin: 0.4 mg/dL (ref 0.3–1.2)
Total Protein: 5.9 g/dL — ABNORMAL LOW (ref 6.5–8.1)

## 2018-01-27 LAB — HIV ANTIBODY (ROUTINE TESTING W REFLEX): HIV Screen 4th Generation wRfx: NONREACTIVE

## 2018-01-27 LAB — BASIC METABOLIC PANEL
Anion gap: 15 (ref 5–15)
BUN: 11 mg/dL (ref 6–20)
CO2: 19 mmol/L — ABNORMAL LOW (ref 22–32)
Calcium: 7.7 mg/dL — ABNORMAL LOW (ref 8.9–10.3)
Chloride: 104 mmol/L (ref 101–111)
Creatinine, Ser: 0.61 mg/dL (ref 0.44–1.00)
GFR calc Af Amer: >60 mL/min (ref >60)
GFR calc non Af Amer: >60 mL/min (ref >60)
Glucose, Bld: 91 mg/dL (ref 65–99)
Potassium: 3.7 mmol/L (ref 3.5–5.1)
Sodium: 138 mmol/L (ref 135–145)

## 2018-01-27 LAB — I-STAT BETA HCG BLOOD, ED (MC, WL, AP ONLY): I-stat hCG, quantitative: 19.9 m[IU]/mL — ABNORMAL HIGH (ref <5)

## 2018-01-27 LAB — URINALYSIS, ROUTINE W REFLEX MICROSCOPIC
Bilirubin Urine: NEGATIVE
Glucose, UA: NEGATIVE mg/dL
Hgb urine dipstick: NEGATIVE
Ketones, ur: NEGATIVE mg/dL
Leukocytes, UA: NEGATIVE
Nitrite: NEGATIVE
Protein, ur: NEGATIVE mg/dL
Specific Gravity, Urine: 1.01 (ref 1.005–1.030)
pH: 5 (ref 5.0–8.0)

## 2018-01-27 LAB — MAGNESIUM
Magnesium: 1.1 mg/dL — ABNORMAL LOW (ref 1.7–2.4)
Magnesium: 1.7 mg/dL (ref 1.7–2.4)
Magnesium: 2 mg/dL (ref 1.7–2.4)

## 2018-01-27 LAB — PROTIME-INR
INR: 0.98
Prothrombin Time: 12.9 seconds (ref 11.4–15.2)

## 2018-01-27 LAB — LIPASE, BLOOD: Lipase: 20 U/L (ref 11–51)

## 2018-01-27 LAB — I-STAT CG4 LACTIC ACID, ED
Lactic Acid, Venous: 2.13 mmol/L (ref 0.5–1.9)
Lactic Acid, Venous: 1.04 mmol/L (ref 0.5–1.9)

## 2018-01-27 LAB — LACTIC ACID, PLASMA: Lactic Acid, Venous: 0.7 mmol/L (ref 0.5–1.9)

## 2018-01-27 LAB — PROCALCITONIN: Procalcitonin: 0.98 ng/mL

## 2018-01-27 LAB — PREGNANCY, URINE: Preg Test, Ur: NEGATIVE

## 2018-01-27 LAB — STREP PNEUMONIAE URINARY ANTIGEN: Strep Pneumo Urinary Antigen: NEGATIVE

## 2018-01-27 LAB — TRIGLYCERIDES: Triglycerides: 183 mg/dL — ABNORMAL HIGH (ref <150)

## 2018-01-27 LAB — HCG, QUANTITATIVE, PREGNANCY: hCG, Beta Chain, Quant, S: 6 m[IU]/mL — ABNORMAL HIGH (ref <5)

## 2018-01-27 MED ORDER — ORAL CARE MOUTH RINSE
15.0000 mL | OROMUCOSAL | Status: DC
  Administered 2018-01-27 – 2018-02-05 (×80): 15 mL via OROMUCOSAL

## 2018-01-27 MED ORDER — VITAL AF 1.2 CAL PO LIQD
1000.0000 mL | ORAL | Status: DC
  Administered 2018-01-27 – 2018-02-07 (×10): 1000 mL
  Filled 2018-01-27 (×4): qty 1000

## 2018-01-27 MED ORDER — SENNOSIDES-DOCUSATE SODIUM 8.6-50 MG PO TABS: 1.0000 | ORAL_TABLET | Freq: Every evening | ORAL | Status: DC | PRN

## 2018-01-27 MED ORDER — THIAMINE HCL 100 MG/ML IJ SOLN
100.0000 mg | Freq: Every day | INTRAMUSCULAR | Status: DC
  Administered 2018-01-27 – 2018-01-28 (×2): 100 mg via INTRAVENOUS
  Filled 2018-01-27: qty 1
  Filled 2018-01-27: qty 2

## 2018-01-27 MED ORDER — SODIUM CHLORIDE 0.9 % IV SOLN
100.0000 mg | Freq: Two times a day (BID) | INTRAVENOUS | Status: DC
  Administered 2018-01-27 – 2018-02-01 (×11): 100 mg via INTRAVENOUS
  Filled 2018-01-27 (×14): qty 100

## 2018-01-27 MED ORDER — FENTANYL CITRATE (PF) 100 MCG/2ML IJ SOLN
INTRAMUSCULAR | Status: AC
  Filled 2018-01-27: qty 2

## 2018-01-27 MED ORDER — PROPOFOL 1000 MG/100ML IV EMUL
5.0000 ug/kg/min | INTRAVENOUS | Status: DC
  Administered 2018-01-27: 45 ug/kg/min via INTRAVENOUS
  Administered 2018-01-27 (×2): 50 ug/kg/min via INTRAVENOUS
  Administered 2018-01-28 (×2): 40 ug/kg/min via INTRAVENOUS
  Administered 2018-01-28: 50 ug/kg/min via INTRAVENOUS
  Filled 2018-01-27 (×7): qty 100

## 2018-01-27 MED ORDER — VITAL HIGH PROTEIN PO LIQD
1000.0000 mL | ORAL | Status: DC
  Administered 2018-01-27: 1000 mL

## 2018-01-27 MED ORDER — SUCCINYLCHOLINE CHLORIDE 20 MG/ML IJ SOLN
INTRAMUSCULAR | Status: AC | PRN
  Administered 2018-01-27: 100 mg via INTRAVENOUS

## 2018-01-27 MED ORDER — PRO-STAT SUGAR FREE PO LIQD
30.0000 mL | Freq: Three times a day (TID) | ORAL | Status: DC
  Administered 2018-01-27 – 2018-02-04 (×26): 30 mL
  Filled 2018-01-27 (×28): qty 30

## 2018-01-27 MED ORDER — ACETAMINOPHEN 500 MG PO TABS
1000.0000 mg | ORAL_TABLET | Freq: Once | ORAL | Status: AC
Start: 2018-01-27 — End: 2018-01-27
  Administered 2018-01-27: 1000 mg via ORAL
  Filled 2018-01-27: qty 2

## 2018-01-27 MED ORDER — NICOTINE 21 MG/24HR TD PT24
21.0000 mg | MEDICATED_PATCH | Freq: Every day | TRANSDERMAL | Status: DC
  Administered 2018-01-27 – 2018-02-15 (×20): 21 mg via TRANSDERMAL
  Filled 2018-01-27 (×21): qty 1

## 2018-01-27 MED ORDER — SODIUM CHLORIDE 0.9 % IV SOLN
250.0000 mL | INTRAVENOUS | Status: DC | PRN
  Administered 2018-01-27 – 2018-02-07 (×5): 250 mL via INTRAVENOUS

## 2018-01-27 MED ORDER — BUDESONIDE 0.25 MG/2ML IN SUSP
0.2500 mg | Freq: Two times a day (BID) | RESPIRATORY_TRACT | Status: DC
  Administered 2018-01-27 – 2018-02-03 (×15): 0.25 mg via RESPIRATORY_TRACT
  Filled 2018-01-27 (×16): qty 2

## 2018-01-27 MED ORDER — LORAZEPAM 2 MG/ML IJ SOLN: 0.0000 mg | Freq: Two times a day (BID) | INTRAMUSCULAR | Status: DC

## 2018-01-27 MED ORDER — ALBUTEROL SULFATE (2.5 MG/3ML) 0.083% IN NEBU: 2.5000 mg | INHALATION_SOLUTION | RESPIRATORY_TRACT | Status: DC | PRN

## 2018-01-27 MED ORDER — POTASSIUM CHLORIDE CRYS ER 20 MEQ PO TBCR
40.0000 meq | EXTENDED_RELEASE_TABLET | Freq: Once | ORAL | Status: AC
Start: 2018-01-27 — End: 2018-01-27
  Administered 2018-01-27: 40 meq via ORAL
  Filled 2018-01-27: qty 2

## 2018-01-27 MED ORDER — IPRATROPIUM-ALBUTEROL 0.5-2.5 (3) MG/3ML IN SOLN
3.0000 mL | Freq: Four times a day (QID) | RESPIRATORY_TRACT | Status: DC
  Administered 2018-01-27 – 2018-02-09 (×53): 3 mL via RESPIRATORY_TRACT
  Filled 2018-01-27 (×54): qty 3

## 2018-01-27 MED ORDER — LORAZEPAM 2 MG/ML IJ SOLN
1.0000 mg | Freq: Once | INTRAMUSCULAR | Status: AC
  Administered 2018-01-27: 1 mg via INTRAVENOUS
  Filled 2018-01-27: qty 1

## 2018-01-27 MED ORDER — LORAZEPAM 1 MG PO TABS: 1.0000 mg | ORAL_TABLET | Freq: Four times a day (QID) | ORAL | Status: DC | PRN

## 2018-01-27 MED ORDER — LORAZEPAM 2 MG/ML IJ SOLN: 2.0000 mg | INTRAMUSCULAR | Status: DC | PRN

## 2018-01-27 MED ORDER — THIAMINE HCL 100 MG/ML IJ SOLN
Freq: Once | INTRAVENOUS | Status: AC
  Administered 2018-01-27: 06:00:00 via INTRAVENOUS
  Filled 2018-01-27: qty 1000

## 2018-01-27 MED ORDER — CEFTRIAXONE SODIUM 2 G IJ SOLR
2.0000 g | INTRAMUSCULAR | Status: AC
  Administered 2018-01-27 – 2018-02-01 (×7): 2 g via INTRAVENOUS
  Filled 2018-01-27 (×8): qty 20

## 2018-01-27 MED ORDER — ACETAMINOPHEN 325 MG PO TABS
650.0000 mg | ORAL_TABLET | Freq: Four times a day (QID) | ORAL | Status: DC | PRN
  Administered 2018-01-27: 650 mg via ORAL
  Filled 2018-01-27: qty 2

## 2018-01-27 MED ORDER — FENTANYL 2500MCG IN NS 250ML (10MCG/ML) PREMIX INFUSION
25.0000 ug/h | INTRAVENOUS | Status: DC
  Administered 2018-01-27: 300 ug/h via INTRAVENOUS
  Administered 2018-01-28 (×3): 325 ug/h via INTRAVENOUS
  Administered 2018-01-29: 350 ug/h via INTRAVENOUS
  Administered 2018-01-29: 400 ug/h via INTRAVENOUS
  Administered 2018-01-29: 350 ug/h via INTRAVENOUS
  Administered 2018-01-29 – 2018-01-30 (×3): 400 ug/h via INTRAVENOUS
  Filled 2018-01-27 (×10): qty 250

## 2018-01-27 MED ORDER — ENOXAPARIN SODIUM 40 MG/0.4ML ~~LOC~~ SOLN
40.0000 mg | SUBCUTANEOUS | Status: DC
  Administered 2018-01-27 – 2018-02-16 (×21): 40 mg via SUBCUTANEOUS
  Filled 2018-01-27 (×21): qty 0.4

## 2018-01-27 MED ORDER — PROPOFOL 1000 MG/100ML IV EMUL: 0.0000 ug/kg/min | INTRAVENOUS | Status: DC

## 2018-01-27 MED ORDER — SODIUM CHLORIDE 0.9 % IV SOLN
100.0000 mg | Freq: Once | INTRAVENOUS | Status: AC
  Administered 2018-01-27: 100 mg via INTRAVENOUS
  Filled 2018-01-27: qty 100

## 2018-01-27 MED ORDER — FENTANYL 2500MCG IN NS 250ML (10MCG/ML) PREMIX INFUSION
25.0000 ug/h | INTRAVENOUS | Status: DC
  Administered 2018-01-27: 50 ug/h via INTRAVENOUS
  Filled 2018-01-27: qty 250

## 2018-01-27 MED ORDER — MAGNESIUM SULFATE 2 GM/50ML IV SOLN
2.0000 g | Freq: Once | INTRAVENOUS | Status: AC
  Administered 2018-01-27: 2 g via INTRAVENOUS
  Filled 2018-01-27: qty 50

## 2018-01-27 MED ORDER — FOLIC ACID 5 MG/ML IJ SOLN
1.0000 mg | Freq: Every day | INTRAMUSCULAR | Status: DC
  Administered 2018-01-27 – 2018-01-28 (×2): 1 mg via INTRAVENOUS
  Filled 2018-01-27 (×2): qty 0.2

## 2018-01-27 MED ORDER — LORAZEPAM 2 MG/ML IJ SOLN: 0.0000 mg | Freq: Four times a day (QID) | INTRAMUSCULAR | Status: DC

## 2018-01-27 MED ORDER — SODIUM CHLORIDE 0.9 % IV BOLUS
500.0000 mL | Freq: Once | INTRAVENOUS | Status: AC
  Administered 2018-01-27: 500 mL via INTRAVENOUS

## 2018-01-27 MED ORDER — FENTANYL BOLUS VIA INFUSION
50.0000 ug | INTRAVENOUS | Status: DC | PRN
  Filled 2018-01-27: qty 50

## 2018-01-27 MED ORDER — AZITHROMYCIN 500 MG IV SOLR
500.0000 mg | INTRAVENOUS | Status: DC
  Filled 2018-01-27: qty 500

## 2018-01-27 MED ORDER — FAMOTIDINE IN NACL 20-0.9 MG/50ML-% IV SOLN
20.0000 mg | Freq: Two times a day (BID) | INTRAVENOUS | Status: DC
  Administered 2018-01-27 – 2018-02-07 (×24): 20 mg via INTRAVENOUS
  Filled 2018-01-27 (×25): qty 50

## 2018-01-27 MED ORDER — ETOMIDATE 2 MG/ML IV SOLN
INTRAVENOUS | Status: AC | PRN
  Administered 2018-01-27: 20 mg via INTRAVENOUS

## 2018-01-27 MED ORDER — INSULIN ASPART 100 UNIT/ML ~~LOC~~ SOLN
0.0000 [IU] | SUBCUTANEOUS | Status: DC
  Administered 2018-01-28 – 2018-01-29 (×6): 1 [IU] via SUBCUTANEOUS
  Administered 2018-01-30: 2 [IU] via SUBCUTANEOUS
  Administered 2018-01-30 – 2018-01-31 (×7): 1 [IU] via SUBCUTANEOUS
  Administered 2018-01-31: 2 [IU] via SUBCUTANEOUS
  Administered 2018-02-01: 1 [IU] via SUBCUTANEOUS
  Administered 2018-02-01: 2 [IU] via SUBCUTANEOUS
  Administered 2018-02-01 – 2018-02-02 (×7): 1 [IU] via SUBCUTANEOUS
  Administered 2018-02-03: 2 [IU] via SUBCUTANEOUS
  Administered 2018-02-03 – 2018-02-04 (×6): 1 [IU] via SUBCUTANEOUS
  Administered 2018-02-05: 2 [IU] via SUBCUTANEOUS
  Administered 2018-02-05: 1 [IU] via SUBCUTANEOUS
  Administered 2018-02-05 – 2018-02-06 (×7): 2 [IU] via SUBCUTANEOUS
  Administered 2018-02-06 – 2018-02-07 (×3): 1 [IU] via SUBCUTANEOUS
  Administered 2018-02-07 (×2): 2 [IU] via SUBCUTANEOUS
  Administered 2018-02-07: 3 [IU] via SUBCUTANEOUS
  Administered 2018-02-08 – 2018-02-13 (×17): 1 [IU] via SUBCUTANEOUS
  Administered 2018-02-13: 2 [IU] via SUBCUTANEOUS
  Administered 2018-02-13 – 2018-02-15 (×11): 1 [IU] via SUBCUTANEOUS
  Administered 2018-02-15: 2 [IU] via SUBCUTANEOUS

## 2018-01-27 MED ORDER — IOPAMIDOL (ISOVUE-370) INJECTION 76%
INTRAVENOUS | Status: AC
  Filled 2018-01-27: qty 100

## 2018-01-27 MED ORDER — VANCOMYCIN HCL IN DEXTROSE 1-5 GM/200ML-% IV SOLN
1000.0000 mg | Freq: Two times a day (BID) | INTRAVENOUS | Status: DC
  Administered 2018-01-27 – 2018-01-28 (×2): 1000 mg via INTRAVENOUS
  Filled 2018-01-27 (×2): qty 200

## 2018-01-27 MED ORDER — FENTANYL CITRATE (PF) 100 MCG/2ML IJ SOLN
50.0000 ug | Freq: Once | INTRAMUSCULAR | Status: AC
  Administered 2018-01-27: 50 ug via INTRAVENOUS

## 2018-01-27 MED ORDER — ADULT MULTIVITAMIN W/MINERALS CH: 1.0000 | ORAL_TABLET | Freq: Every day | ORAL | Status: DC

## 2018-01-27 MED ORDER — CHLORHEXIDINE GLUCONATE 0.12% ORAL RINSE (MEDLINE KIT)
15.0000 mL | Freq: Two times a day (BID) | OROMUCOSAL | Status: DC
  Administered 2018-01-27 – 2018-02-05 (×19): 15 mL via OROMUCOSAL

## 2018-01-27 MED ORDER — LORAZEPAM 2 MG/ML IJ SOLN
INTRAMUSCULAR | Status: AC
  Filled 2018-01-27: qty 1

## 2018-01-27 MED ORDER — MIDAZOLAM HCL 2 MG/2ML IJ SOLN
2.0000 mg | INTRAMUSCULAR | Status: AC | PRN
  Administered 2018-01-28 – 2018-01-29 (×3): 2 mg via INTRAVENOUS
  Filled 2018-01-27 (×3): qty 2

## 2018-01-27 MED ORDER — PRO-STAT SUGAR FREE PO LIQD
30.0000 mL | Freq: Two times a day (BID) | ORAL | Status: DC
  Administered 2018-01-27: 30 mL
  Filled 2018-01-27: qty 30

## 2018-01-27 MED ORDER — PROPOFOL 1000 MG/100ML IV EMUL
INTRAVENOUS | Status: AC
  Filled 2018-01-27: qty 100

## 2018-01-27 MED ORDER — LORAZEPAM 2 MG/ML IJ SOLN: 1.0000 mg | Freq: Four times a day (QID) | INTRAMUSCULAR | Status: DC | PRN

## 2018-01-27 MED ORDER — ENOXAPARIN SODIUM 40 MG/0.4ML ~~LOC~~ SOLN: 40.0000 mg | Freq: Every day | SUBCUTANEOUS | Status: DC

## 2018-01-27 MED ORDER — POTASSIUM CHLORIDE 10 MEQ/100ML IV SOLN
10.0000 meq | INTRAVENOUS | Status: AC
  Administered 2018-01-27 (×3): 10 meq via INTRAVENOUS
  Filled 2018-01-27 (×3): qty 100

## 2018-01-27 MED ORDER — SODIUM CHLORIDE 0.9 % IV BOLUS (SEPSIS)
1000.0000 mL | Freq: Once | INTRAVENOUS | Status: AC
  Administered 2018-01-27: 1000 mL via INTRAVENOUS

## 2018-01-27 MED ORDER — MIDAZOLAM HCL 2 MG/2ML IJ SOLN
2.0000 mg | INTRAMUSCULAR | Status: DC | PRN
  Administered 2018-01-28 – 2018-02-05 (×29): 2 mg via INTRAVENOUS
  Filled 2018-01-27 (×31): qty 2

## 2018-01-27 MED ORDER — HYDROCODONE-HOMATROPINE 5-1.5 MG/5ML PO SYRP
5.0000 mL | ORAL_SOLUTION | Freq: Once | ORAL | Status: AC
Start: 2018-01-27 — End: 2018-01-27
  Administered 2018-01-27: 5 mL via ORAL
  Filled 2018-01-27: qty 5

## 2018-01-27 MED ORDER — PROPOFOL 1000 MG/100ML IV EMUL
5.0000 ug/kg/min | Freq: Once | INTRAVENOUS | Status: AC
  Administered 2018-01-27: 5 ug/kg/min via INTRAVENOUS

## 2018-01-27 MED ORDER — SODIUM CHLORIDE 0.9% FLUSH
3.0000 mL | Freq: Two times a day (BID) | INTRAVENOUS | Status: DC
  Administered 2018-01-27: 3 mL via INTRAVENOUS

## 2018-01-27 MED ORDER — LORAZEPAM 2 MG/ML IJ SOLN
1.0000 mg | Freq: Once | INTRAMUSCULAR | Status: AC
  Administered 2018-01-27: 1 mg via INTRAVENOUS

## 2018-01-27 MED ORDER — ACETAMINOPHEN 650 MG RE SUPP: 650.0000 mg | Freq: Four times a day (QID) | RECTAL | Status: DC | PRN

## 2018-01-27 MED ORDER — VANCOMYCIN HCL 10 G IV SOLR
1250.0000 mg | Freq: Once | INTRAVENOUS | Status: AC
  Administered 2018-01-27: 1250 mg via INTRAVENOUS
  Filled 2018-01-27: qty 1250

## 2018-01-27 MED ORDER — FENTANYL CITRATE (PF) 100 MCG/2ML IJ SOLN
25.0000 ug | INTRAMUSCULAR | Status: DC | PRN
  Administered 2018-01-27 – 2018-02-04 (×13): 100 ug via INTRAVENOUS
  Filled 2018-01-27 (×4): qty 2

## 2018-01-27 MED ORDER — FENTANYL CITRATE (PF) 100 MCG/2ML IJ SOLN
100.0000 ug | Freq: Once | INTRAMUSCULAR | Status: AC
  Administered 2018-01-27: 100 ug via INTRAVENOUS

## 2018-01-27 NOTE — ED Notes (Signed)
Note sent to pharmacy to verify mag sulfate

## 2018-01-27 NOTE — H&P (Signed)
History and Physical    UNITA DETAMORE ZJQ:[id number] DOB: 25-Jun-1971 DOA: 01/27/2018  PCP: Eulas Post, MD   Patient coming from: Home   Chief Complaint: Cough, SOB, blue in the face   HPI: Natalie Morrison is a 47 y.o. female with medical history significant for alcohol abuse, depression with anxiety, and history of asthma, now presenting to the emergency department for evaluation of cough, shortness of breath, and cyanosis.  Patient had been suffering from cough and sinus congestion going back one month, was treated with Augmentin in the outpatient setting, improved some initially, but has since worsened again with progressive shortness of breath and subjective fevers.  Her condition has worsened significantly over the past day, her husband found her to have markedly labored breathing and a bluish-purple hue, prompting her presentation to the ED.  History from the patient is limited by her acute distress.  ED Course: Upon arrival to the ED, patient is found to be febrile to 39.3 C, saturating only 51% on room air initially, tachypneic to 49, tachycardic in the 120s, and initial blood pressure 92/63.  EKG features sinus tachycardia with rate 133 and QTc interval 571 ms.  Chest x-ray is notable for diffuse interstitial and alveolar airspace opacities most compatible with bronchopneumonia.  Chemistry panel reveals a potassium of 2.8 and CBC is notable for leukocytosis to 30,000 and chronic macrocytosis.  Lactic acid is elevated to 2.13, INR is normal, and urinalysis is unremarkable.  Blood cultures were collected, 30 cc/kg normal saline bolus was given, patient was treated with 30 mEq IV potassium, placed on nonrebreather, and started on Rocephin and doxycycline.  Heart rate and respiratory rate have improved some, she is now saturating 90% on nonrebreather, blood pressure is stable, and she will be admitted to the stepdown unit for ongoing evaluation and management of sepsis with pneumonia and  acute hypoxic respiratory failure.  Review of Systems:  All other systems reviewed and apart from HPI, are negative.  Past Medical History:  Diagnosis Date  . Alcohol abuse   . Anemia   . Anxiety   . Anxiety and depression   . Asthma   . Depression   . Hepatitis A    "when I was a kid"  . Hypertension     Past Surgical History:  Procedure Laterality Date  . BREAST LUMPECTOMY Right   . TONSILLECTOMY AND ADENOIDECTOMY Bilateral over 30 years ago     reports that she has been smoking cigarettes.  She has a 16.50 pack-year smoking history. She has never used smokeless tobacco. She reports that she drinks about 4.2 oz of alcohol per week. She reports that she has current or past drug history. Drug: Marijuana. Frequency: 1.00 time per week.  Allergies  Allergen Reactions  . Nitrofurantoin Hives  . Other Other (See Comments)    Allergies mold, dust per allergy test  . Sulfonamide Derivatives Other (See Comments)    Unknown allergic reaction per husband     Family History  Problem Relation Age of Onset  . Heart disease Mother   . Liver disease Mother   . Heart disease Father      Prior to Admission medications   Medication Sig Start Date End Date Taking? Authorizing Provider  albuterol (PROVENTIL,VENTOLIN) 90 MCG/ACT inhaler Inhale 2 puffs into the lungs every 6 (six) hours as needed for wheezing or shortness of breath. Reported on 01/02/2016   Yes [provider]  amoxicillin-clavulanate (AUGMENTIN) 875-125 MG tablet Take 1 tablet  by mouth 2 (two) times daily. 01/26/18  Yes Burchette, Alinda Sierras, MD  cetirizine (ZYRTEC) 10 MG chewable tablet Chew 10 mg by mouth at bedtime.    Yes [provider]  fluticasone (FLONASE) 50 MCG/ACT nasal spray Place 1 spray into both nostrils daily.   Yes [provider]  furosemide (LASIX) 20 MG tablet TAKE 1 TABLET BY MOUTH AS NEEDED FOR SWELLING. 12/03/17  Yes Burchette, Alinda Sierras, MD  GLUCOSAMINE-CHONDROITIN PO Take 1  tablet by mouth daily.   Yes [provider]  Multiple Vitamin (MULTIVITAMIN WITH MINERALS) TABS tablet Take 1 tablet by mouth daily. One a Day Women's   Yes [provider]  vitamin B-12 1000 MCG tablet Take 1 tablet (1,000 mcg total) by mouth daily. 11/02/16  Yes Lavina Hamman, MD    Physical Exam: Vitals:   01/27/18 0230 01/27/18 0245 01/27/18 0300 01/27/18 0315  BP: 136/75 109/63 (!) 147/86 (!) 129/99  Pulse: (!) 129 (!) 119 (!) 123 (!) 123  Resp: (!) 38 (!) 34 18 (!) 54  Temp:      TempSrc:      SpO2: 94% 94% 93% 92%      Constitutional:  In respiratory distress with tachypnea, accessory muscle recruitment. Diaphoretic.  Eyes: PERTLA, lids and conjunctivae normal ENMT: Mucous membranes are moist. Posterior pharynx clear of any exudate or lesions.   Neck: normal, supple, no masses, no thyromegaly Respiratory: Labored breathing, dyspneic with speech. Diffuse rhonchi. No cyanosis.  Cardiovascular: Rate ~120 and regular. No extremity edema.  Abdomen: No distension, no tenderness, soft. Bowel sounds active.  Musculoskeletal: no clubbing / cyanosis. No joint deformity upper and lower extremities.  Skin: no significant rashes, lesions, ulcers. Diaphoresis. Neurologic: No facial asymmetry. Sensation intact. Moving all extremities.  Psychiatric: Alert and oriented to person, place, and situation. Anxious.     Labs on Admission: I have personally reviewed following labs and imaging studies  CBC: Recent Labs  Lab 01/27/18 0057  WBC 30.4*  NEUTROABS 26.5*  HGB 13.3  HCT 39.3  MCV 105.9*  PLT 010   Basic Metabolic Panel: Recent Labs  Lab 01/27/18 0057  NA 135  K 2.8*  CL 98*  CO2 19*  GLUCOSE 118*  BUN 14  CREATININE 0.68  CALCIUM 8.6*   GFR: Estimated Creatinine Clearance: 80.5 mL/min (by C-G formula based on SCr of 0.68 mg/dL). Liver Function Tests: Recent Labs  Lab 01/27/18 0057  AST 27  ALT 17  ALKPHOS 115  BILITOT 0.4  PROT 5.9*    ALBUMIN 2.5*   No results for input(s): LIPASE, AMYLASE in the last 168 hours. No results for input(s): AMMONIA in the last 168 hours. Coagulation Profile: Recent Labs  Lab 01/27/18 0057  INR 0.98   Cardiac Enzymes: No results for input(s): CKTOTAL, CKMB, CKMBINDEX, TROPONINI in the last 168 hours. BNP (last 3 results) No results for input(s): PROBNP in the last 8760 hours. HbA1C: No results for input(s): HGBA1C in the last 72 hours. CBG: No results for input(s): GLUCAP in the last 168 hours. Lipid Profile: No results for input(s): CHOL, HDL, LDLCALC, TRIG, CHOLHDL, LDLDIRECT in the last 72 hours. Thyroid Function Tests: No results for input(s): TSH, T4TOTAL, FREET4, T3FREE, THYROIDAB in the last 72 hours. Anemia Panel: No results for input(s): VITAMINB12, FOLATE, FERRITIN, TIBC, IRON, RETICCTPCT in the last 72 hours. Urine analysis:    Component Value Date/Time   COLORURINE YELLOW 01/27/2018 [place] 01/27/2018 0245   LABSPEC 1.010 01/27/2018  [place] 5.0 01/27/2018 0245   GLUCOSEU NEGATIVE 01/27/2018 0245   HGBUR NEGATIVE 01/27/2018 0245   HGBUR negative 01/01/2010 1340   BILIRUBINUR NEGATIVE 01/27/2018 0245   BILIRUBINUR 3+ 03/18/2016 1614   KETONESUR NEGATIVE 01/27/2018 0245   PROTEINUR NEGATIVE 01/27/2018 0245   UROBILINOGEN 2.0 03/18/2016 1614   UROBILINOGEN 0.2 05/06/2013 1440   NITRITE NEGATIVE 01/27/2018 0245   LEUKOCYTESUR NEGATIVE 01/27/2018 0245   Sepsis Labs: @LABRCNTIP (procalcitonin:4,lacticidven:4) )No results found for this or any previous visit (from the past 240 hour(s)).   Radiological Exams on Admission: Dg Chest Portable 1 View  Result Date: 01/27/2018 CLINICAL DATA:  Shortness of breath, hypoxia. EXAM: PORTABLE CHEST 1 VIEW COMPARISON:  Chest radiograph October 30, 2016 FINDINGS: New diffuse inner cyst distal and alveolar airspace opacities. Small pleural effusions. Cardiomediastinal silhouette is normal. No  pneumothorax. Soft tissue planes and included osseous structures are nonsuspicious. Mild dextroscoliosis could be positional. IMPRESSION: Diffuse interstitial and alveolar airspace opacities most compatible with bronchopneumonia. Electronically Signed   By: Elon Alas M.D.   On: 01/27/2018 01:10    EKG: Independently reviewed. Sinus tachycardia (rate 133), QTc 571 ms.   Assessment/Plan   1. Sepsis secondary to PNA; acute hypoxic respiratory failure   - Presents with SOB, productive cough, and cyanosis after ~1 month of SOB and cough  - She was treated with Augmentin but continued to worsen  - She was cyanotic with sat 51%, fever, leukocytosis, marked tachypnea, tachycardia and with elevated lactate in ED - CXR with diffuse interstitial and alveolar opacities suggestive of bronchopneumonia  - Blood cultures collected in ED, 30 cc/kg NS bolus given, and she was started on Rocephin and doxycycline  - She is requiring NRB with 15 Lpm to maintain sat of 90%  - Check ABG now, add vancomycin for now given critical illness, continue supportive care with supplemental O2   2. Hypokalemia  - Serum potassium is 2.8 on admission with prolonged QT on EKG  - Treated with 30 mEq IV potassium in ED, given 2 g IV mag empirically  - Continue cardiac monitoring, repeat chemistry panel in am    3. Prolonged QT interval  - QTc is 571 ms on admission  - Hypokalemia likely contributing  - Continue cardiac monitoring, replete electrolytes, avoid offending medications, repeat EKG in am    4. Alcohol dependence  - She is tachycardic, anxious, and diaphoretic on admission; she is febrile and septic with PNA but also may be withdrawing  - Monitor with CIWA and prn Ativan, supplement vitamins and minerals    DVT prophylaxis: Lovenox Code Status: Full  Family Communication: Significant other updated at bedside Consults called: None Admission status: Inpatient     Vianne Bulls, MD [facility] Pager [phone]  If 7PM-7AM, please contact night-coverage [email or web address] Password [facility]  01/27/2018, 3:28 AM

## 2018-01-27 NOTE — ED Provider Notes (Addendum)
[facility] EMERGENCY DEPARTMENT Provider Note   CSN: [id number] Arrival date & time: 01/27/18  0044     History   Chief Complaint Chief Complaint  Patient presents with  . Shortness of Breath    HPI Natalie Morrison is a 47 y.o. female with a hx of EtOH abuse, anemia, anxiety, HTN, current smoker, asthma presents to the Emergency Department complaining of gradual, persistent, progressively worsening SOB and fevers onset 1 week ago.  Pt reports she has been sick for several with cough and congestion.  Patient also with associated chills, sweats and fatigue.  She reports severe shortness of breath over the last week which she attributed to asthma exacerbation from her allergies.  She reports she has used an entire albuterol inhaler in the last week in addition to a course of steroids.  She states subjective fevers at home without measurement.  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  Additionally, patient saw her primary care provider at 430 this afternoon.  She was discharged home with Augmentin and scheduled for chest x-ray tomorrow.  Of note, record review shows that primary care physician documented oxygen saturations of 84% on room air however they questioned the accuracy.  Patient significant other in the room reports that when she arrived home her lips were blue and she had significant difficulty breathing.  On arrival here in the emergency department, patient's oxygen saturation was 51% and she was cyanotic.  No specific aggravating or alleviating factors.  Patient denies headache, neck pain, chest pain, abdominal pain, nausea, vomiting, diarrhea, syncope, dysuria, hematuria.  Pt did travel to [place] in Feb 2019.  The history is provided by the patient and medical records. No language interpreter was used.    Past Medical History:  Diagnosis Date  . Alcohol abuse   . Anemia   . Anxiety   .  Anxiety and depression   . Asthma   . Depression   . Hepatitis A    "when I was a kid"  . Hypertension     Patient Active Problem List   Diagnosis Date Noted  . Sepsis due to pneumonia ([place]) 01/27/2018  . Acute respiratory failure with hypoxia ([place]) 01/27/2018  . Hypokalemia 01/27/2018  . Prolonged QT interval 01/27/2018  . Acute hypoxemic respiratory failure ([place]) 01/27/2018  . Hyponatremia 06/23/2017  . Pancreatitis 10/30/2016  . Acute pancreatitis 10/30/2016  . Tobacco abuse 10/30/2016  . Moderate malnutrition ([place]) 12/03/2015  . Hypotension 09/23/2015  . Acute kidney injury ([place]) 09/23/2015  . Septic shock ([place]) 09/21/2015  . History of depression 06/14/2015  . Asthma exacerbation 04/07/2011  . HEMATURIA UNSPECIFIED 07/25/2009  . Alcohol abuse 06/26/2009  . ALLERGIC RHINITIS 06/26/2009  . Fatigue 06/26/2009  . Essential hypertension 06/28/2007    Past Surgical History:  Procedure Laterality Date  . BREAST LUMPECTOMY Right   . TONSILLECTOMY AND ADENOIDECTOMY Bilateral over 30 years ago     OB History   None      Home Medications    Prior to Admission medications   Medication Sig Start Date End Date Taking? Authorizing Provider  albuterol (PROVENTIL,VENTOLIN) 90 MCG/ACT inhaler Inhale 2 puffs into the lungs every 6 (six) hours as needed for wheezing or shortness of breath. Reported on 01/02/2016   Yes [provider]  amoxicillin-clavulanate (AUGMENTIN) 875-125 MG tablet Take 1 tablet by mouth 2 (two) times daily. 01/26/18  Yes Burchette, Alinda Sierras, MD  cetirizine (ZYRTEC) 10 MG chewable tablet Chew 10 mg by mouth at bedtime.    Yes [provider]  fluticasone (FLONASE) 50 MCG/ACT nasal spray Place 1 spray into both nostrils daily.   Yes [provider]  furosemide (LASIX) 20 MG tablet TAKE 1 TABLET BY MOUTH AS NEEDED FOR SWELLING. 12/03/17  Yes Burchette, Alinda Sierras, MD  GLUCOSAMINE-CHONDROITIN PO Take 1 tablet by mouth daily.   Yes [provider]  Multiple Vitamin (MULTIVITAMIN WITH MINERALS) TABS tablet Take 1 tablet by mouth daily. One a Day Women's   Yes [provider]  vitamin B-12 1000 MCG tablet Take 1 tablet (1,000 mcg total) by mouth daily. 11/02/16  Yes Lavina Hamman, MD    Family History Family History  Problem Relation Age of Onset  . Heart disease Mother   . Liver disease Mother   . Heart disease Father     Social History Social History   Tobacco Use  . Smoking status: Current Every Day Smoker    Packs/day: 0.50    Years: 33.00    Pack years: 16.50    Types: Cigarettes  . Smokeless tobacco: Never Used  Substance Use Topics  . Alcohol use: Yes    Alcohol/week: 4.2 oz    Types: 7 Shots of liquor per week  . Drug use: Yes    Frequency: 1.0 times per week    Types: Marijuana    Comment: 10/30/2016 "weekly"     Allergies   Nitrofurantoin; Other; and Sulfonamide derivatives   Review of Systems Review of Systems  Constitutional: Positive for fatigue and fever. Negative for appetite change, diaphoresis and unexpected weight change.  HENT: Negative for mouth sores.   Eyes: Negative for visual disturbance.  Respiratory: Positive for cough, chest tightness, shortness of breath and wheezing.   Cardiovascular: Negative for chest pain.  Gastrointestinal: Negative for abdominal pain, constipation, diarrhea, nausea and vomiting.  Endocrine: Negative for polydipsia, polyphagia and polyuria.  Genitourinary: Negative for dysuria, frequency, hematuria and urgency.  Musculoskeletal: Negative for back pain and neck stiffness.  Skin: Negative for rash.  Allergic/Immunologic: Negative for immunocompromised state.  Neurological: Negative for syncope, light-headedness and headaches.  Hematological: Does not bruise/bleed easily.  Psychiatric/Behavioral: Negative for sleep disturbance. The patient is not nervous/anxious.      Physical Exam Updated Vital Signs BP 92/63   Pulse (!) 124    Temp (!) 102.7 F (39.3 C) (Rectal)   Resp (!) 34   SpO2 97%   Physical Exam  Constitutional: She appears well-developed and well-nourished. She appears distressed.  Ill-appearing, diaphoretic, Awake, alert,  HENT:  Head: Normocephalic and atraumatic.  Mouth/Throat: Oropharynx is clear and moist. No oropharyngeal exudate.  Eyes: Conjunctivae are normal. No scleral icterus.  Neck: Normal range of motion. Neck supple.  Cardiovascular: Regular rhythm and intact distal pulses. Tachycardia present.  Pulses:      Radial pulses are 2+ on the right side, and 2+ on the left side.       Dorsalis pedis pulses are 2+ on the right side, and 2+ on the left side.  Pulmonary/Chest: Accessory muscle usage present. Tachypnea noted. No respiratory distress. She has decreased breath sounds. She has no wheezes.  Equal chest expansion Hypoxic -SPO2 improved after nonrebreather was applied  Abdominal: Soft. Bowel sounds are normal. She exhibits no mass. There is no tenderness. There is no rebound and no guarding.  Musculoskeletal: Normal range of motion. She exhibits no edema.  No calf tenderness or palpable  cord bilaterally  Neurological: She is alert.  Speech is clear and goal oriented Moves extremities without ataxia  Skin: Skin is warm. She is diaphoretic. There is pallor.  Hot to touch  Psychiatric: She has a normal mood and affect.  Nursing note and vitals reviewed.    ED Treatments / Results  Labs (all labs ordered are listed, but only abnormal results are displayed) Labs Reviewed  COMPREHENSIVE METABOLIC PANEL - Abnormal; Notable for the following components:      Result Value   Potassium 2.8 (*)    Chloride 98 (*)    CO2 19 (*)    Glucose, Bld 118 (*)    Calcium 8.6 (*)    Total Protein 5.9 (*)    Albumin 2.5 (*)    Anion gap 18 (*)    All other components within normal limits  CBC WITH DIFFERENTIAL/PLATELET - Abnormal; Notable for the following components:   WBC 30.4 (*)    RBC  3.71 (*)    MCV 105.9 (*)    MCH 35.8 (*)    Neutro Abs 26.5 (*)    Monocytes Absolute 1.5 (*)    All other components within normal limits  HCG, QUANTITATIVE, PREGNANCY - Abnormal; Notable for the following components:   hCG, Beta Chain, Quant, S 6 (*)    All other components within normal limits  MAGNESIUM - Abnormal; Notable for the following components:   Magnesium 1.1 (*)    All other components within normal limits  BASIC METABOLIC PANEL - Abnormal; Notable for the following components:   CO2 19 (*)    Calcium 7.7 (*)    All other components within normal limits  CBC WITH DIFFERENTIAL/PLATELET - Abnormal; Notable for the following components:   WBC 32.8 (*)    RBC 3.44 (*)    MCV 109.0 (*)    MCH 36.0 (*)    Neutro Abs 29.2 (*)    Monocytes Absolute 1.3 (*)    All other components within normal limits  I-STAT CG4 LACTIC ACID, ED - Abnormal; Notable for the following components:   Lactic Acid, Venous 2.13 (*)    All other components within normal limits  I-STAT BETA HCG BLOOD, ED (MC, WL, AP ONLY) - Abnormal; Notable for the following components:   I-stat hCG, quantitative 19.9 (*)    All other components within normal limits  I-STAT ARTERIAL BLOOD GAS, ED - Abnormal; Notable for the following components:   pH, Arterial 7.230 (*)    pCO2 arterial 49.0 (*)    Acid-base deficit 7.0 (*)    All other components within normal limits  I-STAT ARTERIAL BLOOD GAS, ED - Abnormal; Notable for the following components:   pH, Arterial 7.178 (*)    pCO2 arterial 57.5 (*)    Acid-base deficit 7.0 (*)    All other components within normal limits  I-STAT ARTERIAL BLOOD GAS, ED - Abnormal; Notable for the following components:   pH, Arterial 7.154 (*)    pCO2 arterial 63.1 (*)    pO2, Arterial 278.0 (*)    Acid-base deficit 7.0 (*)    All other components within normal limits  CULTURE, BLOOD (ROUTINE X 2)  CULTURE, BLOOD (ROUTINE X 2)  CULTURE, EXPECTORATED SPUTUM-ASSESSMENT  GRAM  STAIN  CULTURE, RESPIRATORY (NON-EXPECTORATED)  PROTIME-INR  URINALYSIS, ROUTINE W REFLEX MICROSCOPIC  PREGNANCY, URINE  PROCALCITONIN  STREP PNEUMONIAE URINARY ANTIGEN  LEGIONELLA PNEUMOPHILA SEROGP 1 UR AG  LACTIC ACID, PLASMA  HIV ANTIBODY (ROUTINE TESTING)  BLOOD  GAS, ARTERIAL  TRIGLYCERIDES  MAGNESIUM  I-STAT CG4 LACTIC ACID, ED  I-STAT CG4 LACTIC ACID, ED    EKG EKG Interpretation  Date/Time:  Thursday January 27 2018 00:41:48 EDT Ventricular Rate:  133 PR Interval:  152 QRS Duration: 80 QT Interval:  384 QTC Calculation: 571 R Axis:   17 Text Interpretation:  Critical Test Result: Long QTc Sinus tachycardia Right atrial enlargement Borderline ECG Confirmed by Orpah Greek [phone]) on 01/27/2018 1:16:22 AM   Radiology Dg Chest Portable 1 View  Result Date: 01/27/2018 CLINICAL DATA:  Endotracheal tube placement. EXAM: PORTABLE CHEST 1 VIEW COMPARISON:  Chest radiograph January 27, 2017 chest radiograph January 27, 2018 FINDINGS: Endotracheal tube tip projects 2.1 cm above the carina. Nasogastric tube looped in proximal stomach with distal tip projecting back at gastric cardia.Worsening interstitial and alveolar airspace opacities. Increased lung volumes. Biapical pleural thickening. No pneumothorax. Soft tissue planes and included osseous structures are nonsuspicious. IMPRESSION: Worsening interstitial and alveolar airspace opacities concerning for bronchopneumonia. Endotracheal tube tip projects 2.1 cm above the carina. Nasogastric tube looped in stomach. Electronically Signed   By: Elon Alas M.D.   On: 01/27/2018 06:06   Dg Chest Portable 1 View  Result Date: 01/27/2018 CLINICAL DATA:  Shortness of breath, hypoxia. EXAM: PORTABLE CHEST 1 VIEW COMPARISON:  Chest radiograph October 30, 2016 FINDINGS: New diffuse inner cyst distal and alveolar airspace opacities. Small pleural effusions. Cardiomediastinal silhouette is normal. No pneumothorax. Soft tissue planes and  included osseous structures are nonsuspicious. Mild dextroscoliosis could be positional. IMPRESSION: Diffuse interstitial and alveolar airspace opacities most compatible with bronchopneumonia. Electronically Signed   By: Elon Alas M.D.   On: 01/27/2018 01:10    Procedures .Critical Care Performed by: Abigail Butts, PA-C Authorized by: Abigail Butts, PA-C   Critical care provider statement:    Critical care time (minutes):  85   Critical care time was exclusive of:  Separately billable procedures and treating other patients and teaching time   Critical care was necessary to treat or prevent imminent or life-threatening deterioration of the following conditions:  Sepsis, respiratory failure and circulatory failure   Critical care was time spent personally by me on the following activities:  Development of treatment plan with patient or surrogate, discussions with consultants, evaluation of patient's response to treatment, examination of patient, obtaining history from patient or surrogate, ordering and performing treatments and interventions, ordering and review of laboratory studies, ordering and review of radiographic studies, pulse oximetry, re-evaluation of patient's condition and review of old charts   I assumed direction of critical care for this patient from another provider in my specialty: no   Procedure Name: Intubation Date/Time: 01/27/2018 5:49 AM Performed by: Abigail Butts, PA-C Pre-anesthesia Checklist: Patient identified, Emergency Drugs available, Suction available, Patient being monitored and Timeout performed Oxygen Delivery Method: Ambu bag Preoxygenation: Pre-oxygenation with 100% oxygen Induction Type: Rapid sequence Ventilation: Mask ventilation with difficulty Laryngoscope Size: Glidescope and 3 Tube size: 7.5 mm Number of attempts: 1 Airway Equipment and Method: Rigid stylet and Video-laryngoscopy Placement Confirmation: ETT inserted  through vocal cords under direct vision,  CO2 detector and Breath sounds checked- equal and bilateral Secured at: 24 cm Tube secured with: ETT holder Dental Injury: Teeth and Oropharynx as per pre-operative assessment       (including critical care time)  Medications Ordered in ED Medications  cefTRIAXone (ROCEPHIN) 2 g in sodium chloride 0.9 % 100 mL IVPB (0 g Intravenous Stopped 01/27/18 0224)  vancomycin (VANCOCIN) IVPB 1000  mg/200 mL premix (has no administration in time range)  doxycycline (VIBRAMYCIN) 100 mg in sodium chloride 0.9 % 250 mL IVPB (has no administration in time range)  enoxaparin (LOVENOX) injection 40 mg (has no administration in time range)  sodium chloride flush (NS) 0.9 % injection 3 mL (3 mLs Intravenous Given 01/27/18 0426)  acetaminophen (TYLENOL) tablet 650 mg (has no administration in time range)    Or  acetaminophen (TYLENOL) suppository 650 mg (has no administration in time range)  senna-docusate (Senokot-S) tablet 1 tablet (has no administration in time range)  folic acid injection 1 mg (has no administration in time range)  thiamine (B-1) injection 100 mg (has no administration in time range)  albuterol (PROVENTIL) (2.5 MG/3ML) 0.083% nebulizer solution 2.5 mg (has no administration in time range)  0.9 %  sodium chloride infusion (has no administration in time range)  famotidine (PEPCID) IVPB 20 mg premix (has no administration in time range)  ipratropium-albuterol (DUONEB) 0.5-2.5 (3) MG/3ML nebulizer solution 3 mL (has no administration in time range)  fentaNYL (SUBLIMAZE) injection 50 mcg (has no administration in time range)  fentaNYL 2511mcg in NS 269mL (43mcg/ml) infusion-PREMIX (has no administration in time range)  fentaNYL (SUBLIMAZE) bolus via infusion 50 mcg (has no administration in time range)  propofol (DIPRIVAN) 1000 MG/100ML infusion (has no administration in time range)  midazolam (VERSED) injection 2 mg (has no administration in time  range)  midazolam (VERSED) injection 2 mg (has no administration in time range)  sodium chloride 0.9 % bolus 1,000 mL (0 mLs Intravenous Stopped 01/27/18 0224)    And  sodium chloride 0.9 % bolus 1,000 mL (0 mLs Intravenous Stopped 01/27/18 0342)  acetaminophen (TYLENOL) tablet 1,000 mg (1,000 mg Oral Given 01/27/18 0127)  doxycycline (VIBRAMYCIN) 100 mg in sodium chloride 0.9 % 250 mL IVPB (0 mg Intravenous Stopped 01/27/18 0427)  HYDROcodone-homatropine (HYCODAN) 5-1.5 MG/5ML syrup 5 mL (5 mLs Oral Given 01/27/18 0201)  potassium chloride SA (K-DUR,KLOR-CON) CR tablet 40 mEq (40 mEq Oral Given 01/27/18 0201)  potassium chloride 10 mEq in 100 mL IVPB (0 mEq Intravenous Stopped 01/27/18 0530)  vancomycin (VANCOCIN) 1,250 mg in sodium chloride 0.9 % 250 mL IVPB (0 mg Intravenous Stopped 01/27/18 0620)  sodium chloride 0.9 % 1,000 mL with thiamine 654 mg, folic acid 1 mg, multivitamins adult 10 mL infusion ( Intravenous New Bag/Given 01/27/18 0628)  magnesium sulfate IVPB 2 g 50 mL (0 g Intravenous Stopped 01/27/18 0530)  LORazepam (ATIVAN) injection 1 mg (1 mg Intravenous Given 01/27/18 0415)  propofol (DIPRIVAN) 1000 MG/100ML infusion (35 mcg/kg/min  59 kg Intravenous Rate/Dose Change 01/27/18 0648)  etomidate (AMIDATE) injection (20 mg Intravenous Given 01/27/18 0541)  succinylcholine (ANECTINE) injection (100 mg Intravenous Given 01/27/18 0542)  LORazepam (ATIVAN) injection 1 mg (1 mg Intravenous Given 01/27/18 0616)  fentaNYL (SUBLIMAZE) injection 100 mcg (100 mcg Intravenous Given 01/27/18 0617)     Initial Impression / Assessment and Plan / ED Course  I have reviewed the triage vital signs and the nursing notes.  Pertinent labs & imaging results that were available during my care of the patient were reviewed by me and considered in my medical decision making (see chart for details).  Clinical Course as of Jan 27 699  Thu Jan 27, 2018  0148 Blood pressure improving from hypotension of 90/50  BP:  121/70 [HM]  0148 Significant tachycardia on arrival.  Pulse Rate(!): 140 [HM]  0149 Febrile  Temp(!): 102.7 F (39.3 C) [HM]  0149 Tachypneic  Resp(!): 31 [HM]  0149 Requiring nonrebreather  SpO2: 100 % [HM]  0149 Elevated  Lactic Acid, Venous(!!): 2.13 [HM]  0149 Significantly elevated  WBC(!): 30.4 [HM]  0149 Normal  Creatinine: 0.68 [HM]  0149 Potassium.  Will replete via oral and IV.  Potassium(!): 2.8 [HM]  0223 Discussed with Dr. Myna Hidalgo who will admit.   [HM]  [id number] With worsening blood gas, persistently high respiratory rate and worsening oxygen saturations despite BiPAP.  He will need intubation and admission to critical care.   [HM]  1572 Significant and very thick secretions.   [HM]  [phone] Discussed with PCCM who will admit   [HM]  0627 Pt with worsening acidosis.  Will increase respiratory rate and repeat lactic acid   [HM]    Clinical Course User Index [HM] Eaven Schwager, Jarrett Soho, PA-C    Patient presents with fever sepsis likely from community-acquired pneumonia.  Patient arrives tachycardic, hypotensive, hypoxic and tachypneic.  Chest x-ray concerning for possible bronchopneumonia.  I personally evaluated these images.  Labs are concerning with significantly elevated white blood cell count > 30.  Elevated lactic acid.  Additionally, patient with hypokalemia.  Patient given oral and IV replacement.  Sepsis order set utilized and patient given fluids.  Community-acquired pneumonia suggest azithromycin however EKG shows prolonged QT.  Patient will receive doxycycline instead.  Patient is a smoker.  No other specific risk factors for DVT/PE.  Less likely at this time.  Patient will need admission for further management.    2:28 AM   Patient's blood pressure has improved significantly after fluid.  Antibiotics running.  She remains tachycardic and significantly tachypneic.  She will be admitted.  IV potassium is being replaced at this time.   Sepsis - Repeat  Assessment  Performed at:    02:40 AM  Vitals     Blood pressure 138/87, pulse (!) 128, temperature (!) 102.7 F (39.3 C), temperature source Rectal, resp. rate (!) 25, SpO2 95 %.  Heart:     Tachycardic  Lungs:    CTA  Capillary Refill:   <2 sec  Peripheral Pulse:   Radial pulse palpable  Skin:     Pale  The patient was discussed with and seen by Dr. Betsey Holiday who agrees with the treatment plan.  5:15 AM Pt requiring intubation due to worsening respiratory status.  She will need PCCM admission.  Concern for possible ARDS   Final Clinical Impressions(s) / ED Diagnoses   Final diagnoses:  Hypoxia  SOB (shortness of breath)  Community acquired pneumonia, unspecified laterality  Sepsis, due to unspecified organism [facility])    ED Discharge Orders    None       Loni Muse Gwenlyn Perking 01/27/18 0229    Orpah Greek, MD 01/27/18 0444    Elford Evilsizer, Jarrett Soho, PA-C 01/27/18 0701    Orpah Greek, MD 01/27/18 [phone]

## 2018-01-27 NOTE — Progress Notes (Signed)
PULMONARY  / CRITICAL CARE MEDICINE  Name: Natalie Morrison MRN: [id number] DOB: 24-Jul-1971    LOS: 0  REFERRING MD :  Dr. Mitzi Hansen   CHIEF COMPLAINT:  Acute hypercapnic respiratory failure   BRIEF PATIENT DESCRIPTION: 47 yo F with PMH asthma (no PFTs to review), HTN, and long history of alcohol use disorder who presented with sinus congestion and cough for 1 month. Seen by PCP on day prior to presentation 4/24 and prescribed Augmentin x 10 days, unclear if started. In the ED, CXR showed bilateral pneumonia and antibiotic therapy started. She was initially admitted to SDU, but acutely decompensated becoming cyanotic and hypoxic and required intubation while in the ED and admission to ICU.   LINES / TUBES: ETT/OG 4/25>  PIV x3 LUE x2 and RUE x1 4/25 Foley catheter 4/25  CULTURES: Respiratory cx 4/25  pending Blood cx x2 4/25 pending   ANTIBIOTICS: Vancomycin 4/25>  Doxycycline 4/25>  Rocephin 4/25>    SIGNIFICANT EVENTS:  4/25 - Intubated and admitted to the ICU    INTERVAL HISTORY: Afebrile and normotensive. Unarousable to voice. Sedated on fentanyl and propofol.   VITAL SIGNS: Temp:  [98.3 F (36.8 C)-102.7 F (39.3 C)] 98.8 F (37.1 C) (04/25 0800) Pulse Rate:  [90-140] 93 (04/25 1000) Resp:  [17-54] 24 (04/25 1000) BP: (89-181)/(56-139) 94/69 (04/25 1000) SpO2:  [84 %-100 %] 97 % (04/25 1000) FiO2 (%):  [50 %-100 %] 50 % (04/25 1000) Weight:  [130 lb (59 kg)-138 lb 14.2 oz (63 kg)] 138 lb 14.2 oz (63 kg) (04/25 0800) HEMODYNAMICS:   VENTILATOR SETTINGS: Vent Mode: PRVC FiO2 (%):  [50 %-100 %] 50 % Set Rate:  [18 bmp-20 bmp] 20 bmp Vt Set:  [450 mL] 450 mL PEEP:  [5 cmH20] 5 cmH20 Plateau Pressure:  [29 cmH20] 29 cmH20 INTAKE / OUTPUT: Intake/Output      04/24 0701 - 04/25 0700 04/25 0701 - 04/26 0700   I.V. (mL/kg) 1000 (16.9) 231 (3.7)   IV Piggyback 3119    Total Intake(mL/kg) 4119 (69.8) 231 (3.7)   Urine (mL/kg/hr)  980 (5)   Total Output  980    Net +4119 -749          PHYSICAL EXAMINATION: General:  Intubated and sedated  Neuro:  Sedated, does not arouse to voice  HEENT:  NCAT, pupils equal, no scleral icterus, ET tube in place \ Cardiovascular:  RRR, no mrg  Lungs:  Coarse breath sounds, no increased work of breathing while on vent Abdomen:  Soft, NTND, decreased bowel sounds  Musculoskeletal:  No deformities, no lower extremity edema bilaterally  Skin:  No rashes or lesions noted    LABS: Cbc Recent Labs  Lab 01/27/18 0057 01/27/18 0324  WBC 30.4* 32.8*  HGB 13.3 12.4  HCT 39.3 37.5  PLT 396 374    Chemistry  Recent Labs  Lab 01/27/18 0057 01/27/18 0324 01/27/18 0840  NA 135 138  --   K 2.8* 3.7  --   CL 98* 104  --   CO2 19* 19*  --   BUN 14 11  --   CREATININE 0.68 0.61  --   CALCIUM 8.6* 7.7*  --   MG  --  1.1* 1.7  GLUCOSE 118* 91  --     Liver fxn Recent Labs  Lab 01/27/18 0057  AST 27  ALT 17  ALKPHOS 115  BILITOT 0.4  PROT 5.9*  ALBUMIN 2.5*   coags Recent Labs  Lab  01/27/18 0057  INR 0.98   Sepsis markers Recent Labs  Lab 01/27/18 0111 01/27/18 0311 01/27/18 0324 01/27/18 0633  LATICACIDVEN 2.13* 1.04  --  0.7  PROCALCITON  --   --  0.98  --    Cardiac markers No results for input(s): CKTOTAL, CKMB, TROPONINI in the last 168 hours. BNP No results for input(s): PROBNP in the last 168 hours. ABG Recent Labs  Lab 01/27/18 0524 01/27/18 0624 01/27/18 0741  PHART 7.178* 7.154* 7.234*  PCO2ART 57.5* 63.1* 55.8*  PO2ART 102.0 278.0* 136.0*  HCO3 21.3 22.2 23.6  TCO2 23 24 25     CBG trend Recent Labs  Lab 01/27/18 0831  GLUCAP 86    IMAGING: diffuse alveolar infiltrates bilaterally   ECG: sinus tachycardia, LAE, no signs of acute ischemia   DIAGNOSES: Principal Problem:   Acute respiratory failure with hypoxia (HCC) Active Problems:   Alcohol abuse   Sepsis due to pneumonia (HCC)   Hypokalemia   Prolonged QT interval   Acute respiratory failure  with hypoxia and hypercapnia (HCC)   Hypomagnesemia   ASSESSMENT / PLAN:  PULMONARY A:  Acute hypoxic and hypercapneic respiratory failure, concern for ARDS  Severe, bilateral CAP - prescribed  Hx of asthma - on Brovana and Pulmicort at home  Current smoker - less than 0.5ppd per PCP's note 4/24  PLAN:   - Continue vent support  - VAP protocol  - Follow up trach aspirate gram stain and culture  - Scheduled duonebs q6h  - Albuterol q4h PRN  - Pulmicort nebs BID   CARDIOVASCULAR A: Sepsis from bilateral pneumonia  Prolonged QTc - 571 ms  HTN - previously on ACEi No history of cardiovascular disease - TTE 2016 with EF 65%, no wall motion abnormalities or diastolic dysfunction, normal RV function  PLAN:  - Continue to monitor K and Mag and replete as needed  - Currently not on pressors  - MAP goal > 65  RENAL A:   Normal renal function at baseline with adequate UOP  Hypokalemia  Hypomagnesemia   PLAN:   - Continue to monitor electrolytes and replete PRN  - Monitor UOP   GASTROINTESTINAL A: Protein calorie malnutrition likely 2/2 alcohol abuse  SUP   PLAN:   - IV famotidine for GI ppx   - Starting tube feeds   HEMATOLOGIC A:   Leukocytosis in the setting of sepsis from severe CAP - improving  Macrocytosis - likely from alcohol use  VTE ppx   PLAN:  - Daily CBC  - SQ Lovenox   INFECTIOUS A:  Sepsis from bilateral pneumonia   PLAN:   - Strep pneum antigen negative  - Follow up Legionella antigen, respiratory and blood cultures  - Continue vancomycin, Rocephin, and doxycycline   ENDOCRINE A:   No acute issues  PLAN:   - CBG monitoring q4h with CBG goal < 180  NEUROLOGIC A: Alcohol use disorder - unclear severity but MCV 105 and protein malnutrition on labs consistent with this  Intubated and sedated  PLAN:   - On propofol and fentanyl gtt with RASS goal 1- to -2  - CIWA once off propofol, Ativan ordered   - Continue folic acid and thiamine     Welford Roche, MD  Internal Medicine PGY-1  P [phone] 01/27/2018, 10:06 AM

## 2018-01-27 NOTE — ED Notes (Signed)
Pt tolerating bipap; sats 89% on bipap; pt appears more comfortable

## 2018-01-27 NOTE — ED Notes (Signed)
CCM at bedside 

## 2018-01-27 NOTE — Progress Notes (Addendum)
Initial Nutrition Assessment  DOCUMENTATION CODES:   Not applicable  INTERVENTION:  Recommend vital AF 1.2 @ 6ml/hr (1021ml/24hrs) +  3 pro-sat BID providing 1596 kcal, 126 g protein, and 875 ml fluid.  Propofol @17ml /hr provides 448 kcal/day (propofol with TF providing 2044 kcal)  Total regime meets 100% of estimated needs.   NUTRITION DIAGNOSIS:   Inadequate oral intake related to inability to eat as evidenced by NPO status.  GOAL:   Provide needs based on ASPEN/SCCM guidelines  MONITOR:   Vent status, TF tolerance, I & O's, Skin, Labs, Weight trends  REASON FOR ASSESSMENT:   Consult Enteral/tube feeding initiation and management  ASSESSMENT:   47 y.o. F admitted on 01/27/18 for acute respiratory failure with hypoxia with subsequent sepsis from pneumonia - possible ARDS. PMH of alcohol abuse, depression/anxeity, asthma.  Pt currently sedated on vent.   Pt has NG tube placed. NG tube feeds currently vital HP @ 40 ml/hr with pro-stat BID. No family at bedside; unable to get nutrition hx.   MVe: 13  MAP: 80  Temp (24hrs), Avg:99.5 F (37.5 C), Min:98.3 F (36.8 C), Max:102.7 F (39.3 C)  Medications reviewed: pulmicort, folic acid, thiamine, rochephin, vibramycin, pepcid, fentanyl, magnesium IV, vancocin, propofol @17ml /hr.  Labs reviewed: CO2 19 (L), magnesium 1.1 (L) 0324 and 1.7 (WNL) 0840, triglycerides 183 (H).   NUTRITION - FOCUSED PHYSICAL EXAM:    Most Recent Value  Orbital Region  No depletion  Upper Arm Region  No depletion  Thoracic and Lumbar Region  No depletion  Buccal Region  No depletion  Temple Region  Mild depletion  Clavicle Bone Region  No depletion  Clavicle and Acromion Bone Region  No depletion  Scapular Bone Region  Unable to assess  Dorsal Hand  Unable to assess  Patellar Region  No depletion  Anterior Thigh Region  No depletion  Posterior Calf Region  Mild depletion  Edema (RD Assessment)  None  Hair  Reviewed  Eyes  Unable  to assess  Mouth  Unable to assess  Skin  Reviewed  Nails  Unable to assess       Diet Order:  Diet NPO time specified  EDUCATION NEEDS:   Not appropriate for education at this time  Skin:  Skin Assessment: Reviewed RN Assessment  Last BM:  Unknown  Height:   Ht Readings from Last 1 Encounters:  01/27/18 5\' 6"  (1.676 m)    Weight:   Wt Readings from Last 1 Encounters:  01/27/18 138 lb 14.2 oz (63 kg)    Ideal Body Weight:  59.09 kg   UBW: 127 lbs %UBW: 109%  BMI:  Body mass index is 22.42 kg/m.  Estimated Nutritional Needs:   Kcal:  1983 kcal  Protein:  110-125 grams  Fluid:  >/= 1.9 or per MD   Hope Budds, Dietetic Intern

## 2018-01-27 NOTE — Telephone Encounter (Signed)
Copied from [place] [id number]. Topic: Quick Communication - See Telephone Encounter >> Jan 27, 2018  1:02 PM Vernona Rieger wrote: CRM for notification. See Telephone encounter for: 01/27/18.  Fara Olden (patient's husband) said that she was in the office yesterday & saw Dr Elease Hashimoto. He said that no xrays could not be done yesterday at her visit and he is kind of frustrated because when he got home last night he found her blue in the face. He wants Dr Elease Hashimoto know that she is in ICU @ [facility] and she is getting worse. He wants to know why the xrays were not done in the office? [phone] , he would like a call back asap.

## 2018-01-27 NOTE — Telephone Encounter (Signed)
Per chart the pt's DPR is for "self only", therefore unable to discuss pt's care with husband.  Spoke with him and advised.  He is very upset. He is questioning "why was she allowed to leave your office in that condition?" and "did you not know she didn't get her xray today?". I did advise husband that xray is walk-in and we would not have been advised if she had not come in for xray. He would like to speak with Dr. Elease Hashimoto directly. Advised him I would send message and ask if he could speak with him.  Dr. Elease Hashimoto - FYI. Thanks!

## 2018-01-27 NOTE — Progress Notes (Signed)
Pharmacy Antibiotic Note  Natalie Morrison is a 47 y.o. female admitted on 01/27/2018 with SOB/fever, possible sepsis.  Pharmacy has been consulted for Vancomycin  dosing.  Plan: Vancomycin 1250  mg IV now, then Vancomycin 1 g IV q12h    Temp (24hrs), Avg:100.4 F (38 C), Min:98.3 F (36.8 C), Max:102.7 F (39.3 C)  Recent Labs  Lab 01/27/18 0057 01/27/18 0111  WBC 30.4*  --   CREATININE 0.68  --   LATICACIDVEN  --  2.13*    Estimated Creatinine Clearance: 80.5 mL/min (by C-G formula based on SCr of 0.68 mg/dL).    Allergies  Allergen Reactions  . Nitrofurantoin Hives  . Other Other (See Comments)    Allergies mold, dust per allergy test  . Sulfonamide Derivatives Other (See Comments)    Unknown allergic reaction per husband    Caryl Pina 01/27/2018 2:27 AM

## 2018-01-27 NOTE — ED Triage Notes (Signed)
Pt arrives cyanotic and short of breath, Sp02 on room air is 51%. Pale and warm to the touch, slightly diaphoretic. Reports PNA x3 weeks. Seen by PCP today and sent home to get an XRAY.

## 2018-01-27 NOTE — ED Provider Notes (Signed)
Patient presented to the ER with shortness of breath.  Patient reports being sick for approximately a month.  She has had cough and chest congestion.  She saw her doctor today and was prescribed antibiotics.  After getting home, however, husband noted that she was cyanotic.  She reports increasing shortness of breath.  Face to face Exam: HEENT - PERRLA Lungs - CTAB, scattered rhonchi, tachypnea Heart - RRR, no M/R/G, tachycardia Abd - S/NT/ND Neuro - alert, oriented x3  Plan: Patient with a temperature of 102.7, tachycardia, tachypnea.  This is consistent with sepsis.  Chest x-ray consistent with pneumonia.  Will require hospitalization.  Initiated on antibiotic coverage and IV fluid bolus for mild hypotension.   Orpah Greek, MD 01/27/18 0140

## 2018-01-27 NOTE — ED Notes (Signed)
Pt appears tachypneic; RT at bedside with bipap

## 2018-01-27 NOTE — Consult Note (Addendum)
Initial Pulmonary/Critical Care Consultation  Patient Name: Natalie Morrison MRN: [id number] DOB: 1970-11-20    ADMISSION DATE:  01/27/2018 CONSULTATION DATE:  01/27/2018  REFERRING MD: Dr. Christia Reading Opyd  REASON FOR CONSULTATION: Acute respiratory failure   HISTORY OF PRESENT ILLNESS  This 47 y.o. Caucasian female is seen in consultation at the request of Dr. Christia Reading Opyd for recommendations on further evaluation and management of acute respiratory failure.  The patient is seen in the emergency department, where the patient has been emergently intubated.  At the time of clinical interview, she is on mechanical ventilatory support and sedated with propofol.  As the patient is unable to provide clinical history at this time, clinical history is obtained from review of the record.  She presented earlier in the evening to the emergency department with complaints of cough, dyspnea and cyanosis.  Apparently, the patient has been struggling with the symptoms for the past month.  She reportedly completed a course of Augmentin in the outpatient setting.  The patient's husband brought her to the emergency department after noticing labored breathing and a bluish-purple hue.  The patient was admitted to the stepdown unit; however, while still in the emergency department, the patient experienced clinical deterioration.  ABG obtained at 0400: pH 7.23, PCO2 49, PO2 97.  Patient was placed on BiPAP support but was only able to achieve SpO2 89%.  ABG obtained at 0524: pH 7.18, PCO2 58, PO2 102, reflecting worsening respiratory acidosis despite noninvasive positive pressure ventilatory support.  This patient is critically ill and cannot provide additional history due to Ventilated and Unable to speak.  REVIEW OF SYSTEMS Constitutional: No weight loss. No night sweats. No fever. No chills. No fatigue. HEENT: No headaches, dysphagia, sore throat, otalgia, nasal congestion, PND CV:  No chest pain, orthopnea, PND,  swelling in lower extremities, palpitations GI:  No abdominal pain, nausea, vomiting, diarrhea, change in bowel pattern, anorexia Resp: No DOE, rest dyspnea, cough, mucus, hemoptysis, wheezing  GU: no dysuria, change in color of urine, no urgency or frequency.  No flank pain. MS:  No joint pain or swelling. No myalgias,  No decreased range of motion.  Psych:  No change in mood or affect. No memory loss. Skin: no rash or lesions.   PAST MEDICAL/SURGICAL/SOCIAL/FAMILY HISTORIES   Past Medical History:  Diagnosis Date  . Alcohol abuse   . Anemia   . Anxiety   . Anxiety and depression   . Asthma   . Depression   . Hepatitis A    "when I was a kid"  . Hypertension     Past Surgical History:  Procedure Laterality Date  . BREAST LUMPECTOMY Right   . TONSILLECTOMY AND ADENOIDECTOMY Bilateral over 30 years ago    Social History   Tobacco Use  . Smoking status: Current Every Day Smoker    Packs/day: 0.50    Years: 33.00    Pack years: 16.50    Types: Cigarettes  . Smokeless tobacco: Never Used  Substance Use Topics  . Alcohol use: Yes    Alcohol/week: 4.2 oz    Types: 7 Shots of liquor per week    Family History  Problem Relation Age of Onset  . Heart disease Mother   . Liver disease Mother   . Heart disease Father      Allergies  Allergen Reactions  . Nitrofurantoin Hives  . Other Other (See Comments)    Allergies mold, dust per allergy test  . Sulfonamide Derivatives Other (See Comments)  Unknown allergic reaction per husband      Prior to Admission medications   Medication Sig Start Date End Date Taking? Authorizing Provider  albuterol (PROVENTIL,VENTOLIN) 90 MCG/ACT inhaler Inhale 2 puffs into the lungs every 6 (six) hours as needed for wheezing or shortness of breath. Reported on 01/02/2016   Yes [provider]  amoxicillin-clavulanate (AUGMENTIN) 875-125 MG tablet Take 1 tablet by mouth 2 (two) times daily. 01/26/18  Yes Burchette, Alinda Sierras, MD    cetirizine (ZYRTEC) 10 MG chewable tablet Chew 10 mg by mouth at bedtime.    Yes [provider]  fluticasone (FLONASE) 50 MCG/ACT nasal spray Place 1 spray into both nostrils daily.   Yes [provider]  furosemide (LASIX) 20 MG tablet TAKE 1 TABLET BY MOUTH AS NEEDED FOR SWELLING. 12/03/17  Yes Burchette, Alinda Sierras, MD  GLUCOSAMINE-CHONDROITIN PO Take 1 tablet by mouth daily.   Yes [provider]  Multiple Vitamin (MULTIVITAMIN WITH MINERALS) TABS tablet Take 1 tablet by mouth daily. One a Day Women's   Yes [provider]  vitamin B-12 1000 MCG tablet Take 1 tablet (1,000 mcg total) by mouth daily. 11/02/16  Yes Lavina Hamman, MD    Current Facility-Administered Medications  Medication Dose Route Frequency Provider Last Rate Last Dose  . acetaminophen (TYLENOL) tablet 650 mg  650 mg Oral Q6H PRN Opyd, Ilene Qua, MD       Or  . acetaminophen (TYLENOL) suppository 650 mg  650 mg Rectal Q6H PRN Opyd, Ilene Qua, MD      . albuterol (PROVENTIL) (2.5 MG/3ML) 0.083% nebulizer solution 2.5 mg  2.5 mg Nebulization Q4H PRN Opyd, Ilene Qua, MD      . cefTRIAXone (ROCEPHIN) 2 g in sodium chloride 0.9 % 100 mL IVPB  2 g Intravenous Q24H Opyd, Ilene Qua, MD   Stopped at 01/27/18 0224  . doxycycline (VIBRAMYCIN) 100 mg in sodium chloride 0.9 % 250 mL IVPB  100 mg Intravenous BID Opyd, Ilene Qua, MD      . enoxaparin (LOVENOX) injection 40 mg  40 mg Subcutaneous Daily Opyd, Ilene Qua, MD      . etomidate (AMIDATE) injection   Intravenous PRN Muthersbaugh, Hannah, PA-C   20 mg at 01/27/18 0541  . folic acid injection 1 mg  1 mg Intravenous Daily Opyd, Timothy S, MD      . LORazepam (ATIVAN) injection 0-4 mg  0-4 mg Intravenous Q6H Opyd, Ilene Qua, MD       Followed by  . [START ON 01/29/2018] LORazepam (ATIVAN) injection 0-4 mg  0-4 mg Intravenous Q12H Opyd, Timothy S, MD      . LORazepam (ATIVAN) tablet 1 mg  1 mg Oral Q6H PRN Opyd, Ilene Qua, MD       Or  . LORazepam  (ATIVAN) injection 1 mg  1 mg Intravenous Q6H PRN Opyd, Ilene Qua, MD      . multivitamin with minerals tablet 1 tablet  1 tablet Oral Daily Opyd, Ilene Qua, MD      . senna-docusate (Senokot-S) tablet 1 tablet  1 tablet Oral QHS PRN Opyd, Ilene Qua, MD      . sodium chloride flush (NS) 0.9 % injection 3 mL  3 mL Intravenous Q12H Opyd, Ilene Qua, MD   3 mL at 01/27/18 0426  . succinylcholine (ANECTINE) injection   Intravenous PRN Muthersbaugh, Jarrett Soho, PA-C   100 mg at 01/27/18 0542  . thiamine (B-1) injection 100 mg  100 mg Intravenous Daily  Vianne Bulls, MD      . vancomycin (VANCOCIN) IVPB 1000 mg/200 mL premix  1,000 mg Intravenous Q12H Opyd, Ilene Qua, MD       Current Outpatient Medications  Medication Sig Dispense Refill  . albuterol (PROVENTIL,VENTOLIN) 90 MCG/ACT inhaler Inhale 2 puffs into the lungs every 6 (six) hours as needed for wheezing or shortness of breath. Reported on 01/02/2016    . amoxicillin-clavulanate (AUGMENTIN) 875-125 MG tablet Take 1 tablet by mouth 2 (two) times daily. 20 tablet 0  . cetirizine (ZYRTEC) 10 MG chewable tablet Chew 10 mg by mouth at bedtime.     . fluticasone (FLONASE) 50 MCG/ACT nasal spray Place 1 spray into both nostrils daily.    . furosemide (LASIX) 20 MG tablet TAKE 1 TABLET BY MOUTH AS NEEDED FOR SWELLING. 20 tablet 3  . GLUCOSAMINE-CHONDROITIN PO Take 1 tablet by mouth daily.    . Multiple Vitamin (MULTIVITAMIN WITH MINERALS) TABS tablet Take 1 tablet by mouth daily. One a Day Women's    . vitamin B-12 1000 MCG tablet Take 1 tablet (1,000 mcg total) by mouth daily. 30 tablet 0     VITAL SIGNS: BP 106/71   Pulse 98   Temp 98.8 F (37.1 C)   Resp (!) 22   Ht 5\' 6"  (1.676 m)   Wt 59 kg (130 lb)   SpO2 99%   BMI 20.98 kg/m   HEMODYNAMICS:    VENTILATOR SETTINGS: Vent Mode: PRVC FiO2 (%):  [70 %-100 %] 70 % Set Rate:  [18 bmp-20 bmp] 20 bmp Vt Set:  [450 mL] 450 mL PEEP:  [5 cmH20] 5 cmH20 Plateau Pressure:  [29 cmH20] 29  cmH20  INTAKE / OUTPUT: No intake/output data recorded.  PHYSICAL EXAMINATION: GENERAL: Intubated.  Agitated.  Facial flushing. HEAD: normocephalic, atraumatic EYE: PERRLA, EOM intact, no scleral icterus, no pallor. THROAT/ORAL CAVITY: Copious oral secretions.  ETT in situ.  NECK: supple, no thyromegaly, no JVD, no lymphadenopathy. Trachea midline. CHEST/LUNG: symmetric in development and expansion.  Diminished air entry.  Bilateral rales and rhonchi.   HEART: Regular S1 and S2 without murmur, rub or gallop.  Tachycardia. ABDOMEN: soft, nontender, nondistended. Normoactive bowel sounds. No rebound. No guarding. No hepatosplenomegaly. EXTREMITIES: Edema: none. No cyanosis.  No clubbing. 2+ DP pulses LYMPHATIC: no cervical/axiallary/inguinal lymph nodes appreciated MUSCULOSKELETAL: No bulk atrophy.   SKIN: No rash or lesion.    LABS:  ABG Recent Labs  Lab 01/27/18 0400 01/27/18 0524 01/27/18 0624  PHART 7.230* 7.178* 7.154*  PCO2ART 49.0* 57.5* 63.1*  PO2ART 97.0 102.0 278.0*    Cardiac Enzymes No results for input(s): TROPONINI, PROBNP in the last 168 hours.  BASIC METABOLIC PROFILE Recent Labs  Lab 01/27/18 0057 01/27/18 0324  NA 135 138  K 2.8* 3.7  CL 98* 104  CO2 19* 19*  BUN 14 11  CREATININE 0.68 0.61  GLUCOSE 118* 91  CALCIUM 8.6* 7.7*  MG  --  1.1*    Glucose No results for input(s): GLUCAP in the last 168 hours.  Liver Enzymes Recent Labs  Lab 01/27/18 0057  AST 27  ALT 17  ALKPHOS 115  BILITOT 0.4  ALBUMIN 2.5*    CBC Recent Labs  Lab 01/27/18 0057 01/27/18 0324  WBC 30.4* 32.8*  HGB 13.3 12.4  HCT 39.3 37.5  PLT 396 374    COAGULATION STUDIES Recent Labs  Lab 01/27/18 0057  INR 0.98    SEPSIS MARKERS Recent Labs  Lab 01/27/18 0111 01/27/18 0311  01/27/18 0324  LATICACIDVEN 2.13* 1.04  --   PROCALCITON  --   --  0.98    CULTURES: Results for orders placed or performed during the hospital encounter of 10/30/16   Culture, blood (routine x 2)     Status: None   Collection Time: 10/30/16  7:14 PM  Result Value Ref Range Status   Specimen Description BLOOD LEFT FOREARM  Final   Special Requests BOTTLES DRAWN AEROBIC ONLY 10CC  Final   Culture NO GROWTH 5 DAYS  Final   Report Status 11/04/2016 FINAL  Final  Culture, blood (routine x 2)     Status: None   Collection Time: 10/30/16  7:17 PM  Result Value Ref Range Status   Specimen Description BLOOD LEFT HAND  Final   Special Requests IN PEDIATRIC BOTTLE 4CC  Final   Culture NO GROWTH 5 DAYS  Final   Report Status 11/04/2016 FINAL  Final     IMAGING: Dg Chest Portable 1 View  Result Date: 01/27/2018 CLINICAL DATA:  Endotracheal tube placement. EXAM: PORTABLE CHEST 1 VIEW COMPARISON:  Chest radiograph January 27, 2017 chest radiograph January 27, 2018 FINDINGS: Endotracheal tube tip projects 2.1 cm above the carina. Nasogastric tube looped in proximal stomach with distal tip projecting back at gastric cardia.Worsening interstitial and alveolar airspace opacities. Increased lung volumes. Biapical pleural thickening. No pneumothorax. Soft tissue planes and included osseous structures are nonsuspicious. IMPRESSION: Worsening interstitial and alveolar airspace opacities concerning for bronchopneumonia. Endotracheal tube tip projects 2.1 cm above the carina. Nasogastric tube looped in stomach. Electronically Signed   By: Elon Alas M.D.   On: 01/27/2018 06:06   Dg Chest Portable 1 View  Result Date: 01/27/2018 CLINICAL DATA:  Shortness of breath, hypoxia. EXAM: PORTABLE CHEST 1 VIEW COMPARISON:  Chest radiograph October 30, 2016 FINDINGS: New diffuse inner cyst distal and alveolar airspace opacities. Small pleural effusions. Cardiomediastinal silhouette is normal. No pneumothorax. Soft tissue planes and included osseous structures are nonsuspicious. Mild dextroscoliosis could be positional. IMPRESSION: Diffuse interstitial and alveolar airspace opacities  most compatible with bronchopneumonia. Electronically Signed   By: Elon Alas M.D.   On: 01/27/2018 01:10    ANTIBIOTICS: Rocephin (4/25-) Doxycycline (4/25-) Vancomycin (4/25-)  SIGNIFICANT EVENTS: 4/25: Presented to [facility] ER with complaints of cough, dyspnea, cyanosis.  Admitted to stepdown bed but clinically deteriorated prior to transfer out of the emergency department.  Intubated.  LINES/TUBES: ET tube (4/25 >> )   ASSESSMENT / PLAN: Principal Problem:   Acute respiratory failure with hypoxia (HCC) Active Problems:   Sepsis due to pneumonia ([place])   Hypokalemia   Prolonged QT interval   Acute respiratory failure with hypoxia and hypercapnia (HCC)   Alcohol abuse   Hypomagnesemia   PULMONARY  Acute hypoxemic and hypercapnic respiratory failure  Community-acquired pneumonia, bilateral Transfer to ICU Trach aspirate for Gram stain and respiratory culture. Albuterol/budesonide scheduled.  INFECTIOUS  Sepsis secondary to pneumonia Culture blood, urine and tracheal aspirate. Continue Rocephin, doxycycline, vancomycin for now.  CARDIOVASCULAR  Long QT syndrome Check ionized calcium.  Will replete potassium and magnesium.  RENAL  Hypomagnesemia  Hypokalemia Electrolyte replacement per protocol.  NEUROLOGIC/PSYCHIATRIC  Alcohol abuse Consider alcohol withdrawal syndrome. Currently on propofol for sedation.  Consider Versed gtt.  HEMATOLOGIC  Macrocytosis without overt anemia  DVT prophylaxis: Subcutaneous heparin  GASTROINTESTINAL  GI prophylaxis: Famotidine   Renee Pain, MD Board Certified by the ABIM, [place] Pager: [phone]  01/27/2018,  6:49 AM

## 2018-01-27 NOTE — ED Notes (Signed)
Dr. Opyd at bedside  

## 2018-01-27 NOTE — Progress Notes (Signed)
Patient transported from ED to 2M07 without any apparent complications. Receiving RT has been giving report.

## 2018-01-27 NOTE — ED Notes (Signed)
ED PA at bedside

## 2018-01-28 ENCOUNTER — Inpatient Hospital Stay (HOSPITAL_COMMUNITY): Payer: BLUE CROSS/BLUE SHIELD

## 2018-01-28 ENCOUNTER — Inpatient Hospital Stay: Payer: Self-pay

## 2018-01-28 LAB — GLUCOSE, CAPILLARY
Glucose-Capillary: 146 mg/dL — ABNORMAL HIGH (ref 65–99)
Glucose-Capillary: 96 mg/dL (ref 65–99)
Glucose-Capillary: 116 mg/dL — ABNORMAL HIGH (ref 65–99)
Glucose-Capillary: 122 mg/dL — ABNORMAL HIGH (ref 65–99)
Glucose-Capillary: 118 mg/dL — ABNORMAL HIGH (ref 65–99)

## 2018-01-28 LAB — BASIC METABOLIC PANEL
Anion gap: 10 (ref 5–15)
BUN: 9 mg/dL (ref 6–20)
CO2: 21 mmol/L — ABNORMAL LOW (ref 22–32)
Calcium: 7.9 mg/dL — ABNORMAL LOW (ref 8.9–10.3)
Chloride: 107 mmol/L (ref 101–111)
Creatinine, Ser: 0.52 mg/dL (ref 0.44–1.00)
GFR calc Af Amer: >60 mL/min (ref >60)
GFR calc non Af Amer: >60 mL/min (ref >60)
Glucose, Bld: 156 mg/dL — ABNORMAL HIGH (ref 65–99)
Potassium: 3.8 mmol/L (ref 3.5–5.1)
Sodium: 138 mmol/L (ref 135–145)

## 2018-01-28 LAB — CBC
HCT: 32.7 % — ABNORMAL LOW (ref 36.0–46.0)
Hemoglobin: 10.2 g/dL — ABNORMAL LOW (ref 12.0–15.0)
MCH: 34.8 pg — ABNORMAL HIGH (ref 26.0–34.0)
MCHC: 31.2 g/dL (ref 30.0–36.0)
MCV: 111.6 fL — ABNORMAL HIGH (ref 78.0–100.0)
Platelets: 254 10*3/uL (ref 150–400)
RBC: 2.93 MIL/uL — ABNORMAL LOW (ref 3.87–5.11)
RDW: 12.8 % (ref 11.5–15.5)
WBC: 20.7 10*3/uL — ABNORMAL HIGH (ref 4.0–10.5)

## 2018-01-28 LAB — LEGIONELLA PNEUMOPHILA SEROGP 1 UR AG: L. pneumophila Serogp 1 Ur Ag: NEGATIVE

## 2018-01-28 LAB — MAGNESIUM: Magnesium: 1.6 mg/dL — ABNORMAL LOW (ref 1.7–2.4)

## 2018-01-28 LAB — PHOSPHORUS: Phosphorus: 3 mg/dL (ref 2.5–4.6)

## 2018-01-28 LAB — TRIGLYCERIDES: Triglycerides: 186 mg/dL — ABNORMAL HIGH (ref <150)

## 2018-01-28 MED ORDER — DOCUSATE SODIUM 50 MG/5ML PO LIQD
100.0000 mg | Freq: Every day | ORAL | Status: DC
  Administered 2018-01-28 – 2018-02-13 (×15): 100 mg
  Filled 2018-01-28 (×18): qty 10

## 2018-01-28 MED ORDER — SODIUM CHLORIDE 0.9 % IV BOLUS
500.0000 mL | Freq: Once | INTRAVENOUS | Status: AC
  Administered 2018-01-28: 500 mL via INTRAVENOUS

## 2018-01-28 MED ORDER — POTASSIUM CHLORIDE 20 MEQ/15ML (10%) PO SOLN
40.0000 meq | Freq: Once | ORAL | Status: AC
  Administered 2018-01-28: 40 meq via ORAL
  Filled 2018-01-28: qty 30

## 2018-01-28 MED ORDER — SODIUM CHLORIDE 0.9 % IV SOLN
0.0000 ug/min | INTRAVENOUS | Status: DC
  Administered 2018-01-28: 50 ug/min via INTRAVENOUS
  Administered 2018-01-29: 60 ug/min via INTRAVENOUS
  Administered 2018-01-29: 25 ug/min via INTRAVENOUS
  Administered 2018-01-29: 35 ug/min via INTRAVENOUS
  Administered 2018-01-30: 10 ug/min via INTRAVENOUS
  Administered 2018-01-31: 15 ug/min via INTRAVENOUS
  Filled 2018-01-28 (×4): qty 40

## 2018-01-28 MED ORDER — FOLIC ACID 1 MG PO TABS
1.0000 mg | ORAL_TABLET | Freq: Every day | ORAL | Status: DC
  Administered 2018-01-29 – 2018-02-07 (×10): 1 mg
  Filled 2018-01-28 (×11): qty 1

## 2018-01-28 MED ORDER — SODIUM CHLORIDE 0.9% FLUSH
10.0000 mL | Freq: Two times a day (BID) | INTRAVENOUS | Status: DC
  Administered 2018-01-29 – 2018-02-11 (×21): 10 mL
  Administered 2018-02-12: 20 mL
  Administered 2018-02-12 – 2018-02-13 (×2): 10 mL
  Administered 2018-02-13: 20 mL
  Administered 2018-02-14 – 2018-02-16 (×4): 10 mL
  Administered 2018-02-16: 40 mL

## 2018-01-28 MED ORDER — SODIUM CHLORIDE 0.9 % IV SOLN
0.0000 ug/min | INTRAVENOUS | Status: DC
  Administered 2018-01-28: 20 ug/min via INTRAVENOUS
  Administered 2018-01-28 (×2): 60 ug/min via INTRAVENOUS
  Filled 2018-01-28 (×3): qty 10

## 2018-01-28 MED ORDER — PROPOFOL 1000 MG/100ML IV EMUL
0.0000 ug/kg/min | INTRAVENOUS | Status: DC
  Administered 2018-01-28: 40 ug/kg/min via INTRAVENOUS
  Administered 2018-01-29: 50 ug/kg/min via INTRAVENOUS
  Administered 2018-01-29: 40 ug/kg/min via INTRAVENOUS
  Filled 2018-01-28 (×3): qty 100

## 2018-01-28 MED ORDER — SODIUM CHLORIDE 0.9% FLUSH
10.0000 mL | INTRAVENOUS | Status: DC | PRN
  Administered 2018-02-14: 10 mL
  Filled 2018-01-28: qty 40

## 2018-01-28 MED ORDER — ACETAMINOPHEN 650 MG RE SUPP: 650.0000 mg | Freq: Four times a day (QID) | RECTAL | Status: DC | PRN

## 2018-01-28 MED ORDER — ACETAMINOPHEN 325 MG PO TABS
650.0000 mg | ORAL_TABLET | Freq: Four times a day (QID) | ORAL | Status: DC | PRN
  Administered 2018-01-28 – 2018-02-04 (×4): 650 mg
  Filled 2018-01-28 (×4): qty 2

## 2018-01-28 MED ORDER — VITAMIN B-1 100 MG PO TABS
100.0000 mg | ORAL_TABLET | Freq: Every day | ORAL | Status: DC
  Administered 2018-01-29 – 2018-02-07 (×10): 100 mg
  Filled 2018-01-28 (×11): qty 1

## 2018-01-28 MED ORDER — POLYVINYL ALCOHOL 1.4 % OP SOLN
1.0000 [drp] | OPHTHALMIC | Status: DC | PRN
  Administered 2018-01-28 – 2018-01-30 (×2): 1 [drp] via OPHTHALMIC
  Filled 2018-01-28: qty 15

## 2018-01-28 MED ORDER — MAGNESIUM SULFATE 4 GM/100ML IV SOLN
4.0000 g | Freq: Once | INTRAVENOUS | Status: AC
  Administered 2018-01-28: 4 g via INTRAVENOUS
  Filled 2018-01-28: qty 100

## 2018-01-28 NOTE — Progress Notes (Signed)
Patient's temp 101.5 axillary. Cooling blanket ordered, ice packs applied, fan, and cool wash clothes applied to forehead. Next tylenol dose due at 1030 and will administer then. Will continue to monitor closely. Modena Morrow E, RN 01/28/2018 8:14 AM

## 2018-01-28 NOTE — Progress Notes (Signed)
PULMONARY  / CRITICAL CARE MEDICINE  Name: Natalie Morrison MRN: [id number] DOB: [birth year]    LOS: 73  REFERRING MD :  Dr. Mitzi Hansen   CHIEF COMPLAINT:  Acute hypercapnic respiratory failure   BRIEF PATIENT DESCRIPTION: 47 yo F with PMH asthma (no PFTs to review), HTN, and long history of alcohol use disorder who presented with sinus congestion and cough for 1 month. Seen by PCP on day prior to presentation 4/24 and prescribed Augmentin x 10 days, unclear if started. In the ED, CXR showed bilateral pneumonia and antibiotic therapy started. She was initially admitted to SDU, but acutely decompensated becoming cyanotic and hypoxic and required intubation while in the ED and admission to ICU.   LINES / TUBES: ETT/OG 4/25>  PIV x3 LUE x2 and RUE x1 4/25 Foley catheter 4/25  CULTURES: Respiratory cx 4/25  pending Blood cx x2 4/25 pending   ANTIBIOTICS: Vancomycin 4/25>  Doxycycline 4/25>  Rocephin 4/25>    SIGNIFICANT EVENTS:  4/25 - Intubated and admitted to the ICU    INTERVAL HISTORY: Febrile overnight with Tmax 103 and also hypotensive. Did not respond to fluid bolus yesterday. Sedated on fentanyl and propofol. Arouses to voice and follows commands.   VITAL SIGNS: Temp:  [96.7 F (35.9 C)-103 F (39.4 C)] 101.8 F (38.8 C) (04/26 0759) Pulse Rate:  [81-112] 81 (04/26 1100) Resp:  [13-35] 19 (04/26 1100) BP: (81-111)/(42-74) 84/54 (04/26 1045) SpO2:  [85 %-100 %] 93 % (04/26 1100) FiO2 (%):  [50 %-60 %] 50 % (04/26 0800) Weight:  [136 lb 11 oz (62 kg)] 136 lb 11 oz (62 kg) (04/26 0452) HEMODYNAMICS:   VENTILATOR SETTINGS: Vent Mode: PRVC FiO2 (%):  [50 %-60 %] 50 % Set Rate:  [20 bmp] 20 bmp Vt Set:  [450 mL] 450 mL PEEP:  [5 cmH20] 5 cmH20 Plateau Pressure:  [13 cmH20-21 cmH20] 20 cmH20 INTAKE / OUTPUT: Intake/Output      04/25 0701 - 04/26 0700 04/26 0701 - 04/27 0700   I.V. (mL/kg) 1673.1 (27) 111.7 (1.8)   Other 10 30   NG/GT 970 335.7   IV Piggyback 950  750   Total Intake(mL/kg) 3603.1 (58.1) 1227.4 (19.8)   Urine (mL/kg/hr) 2155 (1.4) 135 (0.5)   Total Output 2155 135   Net +1448.1 +1092.4          PHYSICAL EXAMINATION: General:  Intubated and sedated  Neuro:  Sedated, arouses to voice and follows commands  HEENT:  NCAT, pupils equal, mild conjunctival injection bilaterally, no scleral icterus, ET/OG tubes in place Cardiovascular:  RRR, no mrg  Lungs:  Coarse breath sounds, no increased work of breathing while on vent Abdomen:  Soft, NTND, decreased bowel sounds  Musculoskeletal:  No deformities, no lower extremity edema bilaterally  Skin:  No rashes or lesions noted    LABS: Cbc Recent Labs  Lab 01/27/18 0057 01/27/18 0324 01/28/18 0330  WBC 30.4* 32.8* 20.7*  HGB 13.3 12.4 10.2*  HCT 39.3 37.5 32.7*  PLT 396 374 254    Chemistry  Recent Labs  Lab 01/27/18 0057  01/27/18 0324 01/27/18 0840 01/27/18 1547 01/28/18 0330  NA 135  --  138  --   --  138  K 2.8*  --  3.7  --   --  3.8  CL 98*  --  104  --   --  107  CO2 19*  --  19*  --   --  21*  BUN 14  --  11  --   --  9  CREATININE 0.68  --  0.61  --   --  0.52  CALCIUM 8.6*  --  7.7*  --   --  7.9*  MG  --    < > 1.1* 1.7 2.0 1.6*  PHOS  --   --   --   --   --  3.0  GLUCOSE 118*  --  91  --   --  156*   < > = values in this interval not displayed.    Liver fxn Recent Labs  Lab 01/27/18 0057  AST 27  ALT 17  ALKPHOS 115  BILITOT 0.4  PROT 5.9*  ALBUMIN 2.5*   coags Recent Labs  Lab 01/27/18 0057  INR 0.98   Sepsis markers Recent Labs  Lab 01/27/18 0111 01/27/18 0311 01/27/18 0324 01/27/18 0633  LATICACIDVEN 2.13* 1.04  --  0.7  PROCALCITON  --   --  0.98  --    Cardiac markers No results for input(s): CKTOTAL, CKMB, TROPONINI in the last 168 hours. BNP No results for input(s): PROBNP in the last 168 hours. ABG Recent Labs  Lab 01/27/18 0524 01/27/18 0624 01/27/18 0741  PHART 7.178* 7.154* 7.234*  PCO2ART 57.5* 63.1* 55.8*   PO2ART 102.0 278.0* 136.0*  HCO3 21.3 22.2 23.6  TCO2 23 24 25     CBG trend Recent Labs  Lab 01/27/18 1605 01/27/18 1947 01/27/18 2330 01/28/18 0342 01/28/18 0758  GLUCAP 107* 111* 118* 146* 10    IMAGING: diffuse alveolar infiltrates bilaterally slightly worsen than yesterday    DIAGNOSES: Principal Problem:   Acute respiratory failure with hypoxia (HCC) Active Problems:   Alcohol abuse   Sepsis due to pneumonia ([place])   Hypokalemia   Prolonged QT interval   Acute respiratory failure with hypoxia and hypercapnia (HCC)   Hypomagnesemia   Community acquired pneumonia   ASSESSMENT / PLAN:  PULMONARY A:  Acute hypoxic and hypercapneic respiratory failure, concern for ARDS  Severe, bilateral CAP -  Hx of asthma - on Brovana and Pulmicort at home  Current smoker - less than 0.5ppd per PCP's note 4/24  PLAN:   - Continue vent support  - VAP protocol  - Follow up trach aspirate gram stain and culture - no organisms seen  - Scheduled duonebs q6h  - Albuterol q4h PRN  - Pulmicort nebs BID   CARDIOVASCULAR A: Sepsis from bilateral pneumonia  Prolonged QTc - 571 ms  HTN - previously on ACEi No history of cardiovascular disease - TTE 2016 with EF 65%, no wall motion abnormalities or diastolic dysfunction, normal RV function  PLAN:  - Continue to monitor K and Mag and replete as needed  - Will start low dose neosynephrine in the setting of hypotension likely medication related  - MAP goal > 65  RENAL A:   Normal renal function at baseline with adequate UOP  Hypokalemia  Hypomagnesemia   PLAN:   - Continue to monitor electrolytes and replete PRN  - Monitor UOP   GASTROINTESTINAL A: Protein calorie malnutrition likely 2/2 alcohol abuse  SUP   PLAN:   - IV famotidine for GI ppx   - Continue tube feeds   HEMATOLOGIC A:   Leukocytosis in the setting of sepsis from severe CAP - improving  Macrocytosis - likely from alcohol use  VTE ppx   PLAN:  -  Daily CBC  - SQ Lovenox for VTE ppx   INFECTIOUS A:  Sepsis from bilateral  pneumonia   PLAN:   - Strep pneum antigen negative  - Follow up Legionella antigen, respiratory and blood cultures  - Continue vancomycin, Rocephin, and doxycycline   ENDOCRINE A:   No acute issues  PLAN:   - CBG monitoring q4h with CBG goal < 180  NEUROLOGIC A: Alcohol use disorder - unclear severity but MCV 105 and protein malnutrition on labs consistent with this  Intubated and sedated  PLAN:   - On propofol and fentanyl gtt with RASS goal 1- to -2  - CIWA once off propofol, Ativan ordered   - Continue folic acid and thiamine    Welford Roche, MD  Internal Medicine PGY-1  P [phone] 01/28/2018, 11:09 AM

## 2018-01-28 NOTE — Progress Notes (Signed)
Peripherally Inserted Central Catheter/Midline Placement  The IV Nurse has discussed with the patient and/or persons authorized to consent for the patient, the purpose of this procedure and the potential benefits and risks involved with this procedure.  The benefits include less needle sticks, lab draws from the catheter, and the patient may be discharged home with the catheter. Risks include, but not limited to, infection, bleeding, blood clot (thrombus formation), and puncture of an artery; nerve damage and irregular heartbeat and possibility to perform a PICC exchange if needed/ordered by physician.  Alternatives to this procedure were also discussed.  Bard Power PICC patient education guide, fact sheet on infection prevention and patient information card has been provided to patient /or left at bedside.    PICC/Midline Placement Documentation        Natalie Morrison 01/28/2018, 5:34 PM

## 2018-01-29 ENCOUNTER — Inpatient Hospital Stay (HOSPITAL_COMMUNITY): Payer: BLUE CROSS/BLUE SHIELD

## 2018-01-29 LAB — CULTURE, RESPIRATORY W GRAM STAIN: Culture: NORMAL

## 2018-01-29 LAB — CBC
HCT: 32.4 % — ABNORMAL LOW (ref 36.0–46.0)
Hemoglobin: 10.1 g/dL — ABNORMAL LOW (ref 12.0–15.0)
MCH: 34.9 pg — ABNORMAL HIGH (ref 26.0–34.0)
MCHC: 31.2 g/dL (ref 30.0–36.0)
MCV: 112.1 fL — ABNORMAL HIGH (ref 78.0–100.0)
Platelets: 279 10*3/uL (ref 150–400)
RBC: 2.89 MIL/uL — ABNORMAL LOW (ref 3.87–5.11)
RDW: 12.9 % (ref 11.5–15.5)
WBC: 20.7 10*3/uL — ABNORMAL HIGH (ref 4.0–10.5)

## 2018-01-29 LAB — GLUCOSE, CAPILLARY
Glucose-Capillary: 112 mg/dL — ABNORMAL HIGH (ref 65–99)
Glucose-Capillary: 121 mg/dL — ABNORMAL HIGH (ref 65–99)
Glucose-Capillary: 126 mg/dL — ABNORMAL HIGH (ref 65–99)
Glucose-Capillary: 132 mg/dL — ABNORMAL HIGH (ref 65–99)
Glucose-Capillary: 137 mg/dL — ABNORMAL HIGH (ref 65–99)
Glucose-Capillary: 149 mg/dL — ABNORMAL HIGH (ref 65–99)
Glucose-Capillary: 93 mg/dL (ref 65–99)

## 2018-01-29 LAB — BASIC METABOLIC PANEL
Anion gap: 7 (ref 5–15)
BUN: 11 mg/dL (ref 6–20)
CO2: 25 mmol/L (ref 22–32)
Calcium: 8.6 mg/dL — ABNORMAL LOW (ref 8.9–10.3)
Chloride: 107 mmol/L (ref 101–111)
Creatinine, Ser: 0.4 mg/dL — ABNORMAL LOW (ref 0.44–1.00)
GFR calc Af Amer: >60 mL/min (ref >60)
GFR calc non Af Amer: >60 mL/min (ref >60)
Glucose, Bld: 141 mg/dL — ABNORMAL HIGH (ref 65–99)
Potassium: 3.9 mmol/L (ref 3.5–5.1)
Sodium: 139 mmol/L (ref 135–145)

## 2018-01-29 LAB — MAGNESIUM: Magnesium: 1.7 mg/dL (ref 1.7–2.4)

## 2018-01-29 LAB — PHOSPHORUS: Phosphorus: 2.9 mg/dL (ref 2.5–4.6)

## 2018-01-29 MED ORDER — MAGNESIUM SULFATE 2 GM/50ML IV SOLN
2.0000 g | Freq: Once | INTRAVENOUS | Status: AC
  Administered 2018-01-29: 2 g via INTRAVENOUS
  Filled 2018-01-29: qty 50

## 2018-01-29 MED ORDER — PROPOFOL 1000 MG/100ML IV EMUL
5.0000 ug/kg/min | Freq: Once | INTRAVENOUS | Status: AC
  Administered 2018-01-29: 45 ug/kg/min via INTRAVENOUS
  Filled 2018-01-29: qty 100

## 2018-01-29 MED ORDER — DEXMEDETOMIDINE HCL IN NACL 400 MCG/100ML IV SOLN
0.4000 ug/kg/h | INTRAVENOUS | Status: DC
  Administered 2018-01-29: 1.8 ug/kg/h via INTRAVENOUS
  Administered 2018-01-29: 1.6 ug/kg/h via INTRAVENOUS
  Administered 2018-01-29: 2 ug/kg/h via INTRAVENOUS
  Administered 2018-01-30: 1.5 ug/kg/h via INTRAVENOUS
  Administered 2018-01-30: 1.2 ug/kg/h via INTRAVENOUS
  Administered 2018-01-30: 2 ug/kg/h via INTRAVENOUS
  Administered 2018-01-30: 1.5 ug/kg/h via INTRAVENOUS
  Administered 2018-01-30: 1 ug/kg/h via INTRAVENOUS
  Administered 2018-01-31: 1.8 ug/kg/h via INTRAVENOUS
  Administered 2018-01-31: 2 ug/kg/h via INTRAVENOUS
  Administered 2018-01-31 (×4): 1.8 ug/kg/h via INTRAVENOUS
  Administered 2018-02-01 (×2): 2 ug/kg/h via INTRAVENOUS
  Administered 2018-02-01: 1.2 ug/kg/h via INTRAVENOUS
  Administered 2018-02-01 – 2018-02-05 (×28): 2 ug/kg/h via INTRAVENOUS
  Filled 2018-01-29 (×46): qty 100
  Filled 2018-01-29: qty 200
  Filled 2018-01-29: qty 100

## 2018-01-29 MED ORDER — DEXMEDETOMIDINE HCL IN NACL 200 MCG/50ML IV SOLN
0.4000 ug/kg/h | INTRAVENOUS | Status: DC
Start: 2018-01-29 — End: 2018-01-29
  Administered 2018-01-29: 0.4 ug/kg/h via INTRAVENOUS
  Administered 2018-01-29: 1 ug/kg/h via INTRAVENOUS
  Filled 2018-01-29 (×2): qty 50

## 2018-01-29 MED ORDER — FUROSEMIDE 10 MG/ML IJ SOLN
20.0000 mg | Freq: Four times a day (QID) | INTRAMUSCULAR | Status: AC
  Administered 2018-01-29 (×3): 20 mg via INTRAVENOUS
  Filled 2018-01-29 (×4): qty 2

## 2018-01-29 MED ORDER — PROPOFOL 1000 MG/100ML IV EMUL: 5.0000 ug/kg/min | INTRAVENOUS | Status: DC

## 2018-01-29 MED ORDER — POTASSIUM PHOSPHATES 15 MMOLE/5ML IV SOLN
30.0000 mmol | Freq: Once | INTRAVENOUS | Status: AC
  Administered 2018-01-29: 30 mmol via INTRAVENOUS
  Filled 2018-01-29: qty 10

## 2018-01-29 NOTE — Plan of Care (Signed)
  Problem: Clinical Measurements: Goal: Respiratory complications will improve Outcome: Progressing Goal: Cardiovascular complication will be avoided Outcome: Progressing   Problem: Nutrition: Goal: Adequate nutrition will be maintained Outcome: Progressing   Problem: Respiratory: Goal: Ability to maintain a clear airway and adequate ventilation will improve Outcome: Progressing   Problem: Respiratory: Goal: Ability to maintain adequate ventilation will improve Outcome: Progressing   Problem: Activity: Goal: Risk for activity intolerance will decrease Outcome: Not Progressing

## 2018-01-29 NOTE — Progress Notes (Signed)
PULMONARY  / CRITICAL CARE MEDICINE  Name: Natalie Morrison MRN: [id number] DOB: 1971/08/12    LOS: 2  REFERRING MD :  Dr. Mitzi Hansen   CHIEF COMPLAINT:  Acute hypercapnic respiratory failure   BRIEF PATIENT DESCRIPTION: 47 yo F with PMH asthma (no PFTs to review), HTN, and long history of alcohol use disorder who presented with sinus congestion and cough for 1 month. Seen by PCP on day prior to presentation 4/24 and prescribed Augmentin x 10 days, unclear if started. In the ED, CXR showed bilateral pneumonia and antibiotic therapy started. She was initially admitted to SDU, but acutely decompensated becoming cyanotic and hypoxic and required intubation while in the ED and admission to ICU.   LINES / TUBES: ETT/OG 4/25>  PIV x3 LUE x2 and RUE x1 4/25 Foley catheter 4/25  CULTURES: Respiratory cx 4/25  pending Blood cx x2 4/25 pending   ANTIBIOTICS: Vancomycin 4/25>  Doxycycline 4/25>  Rocephin 4/25>    SIGNIFICANT EVENTS:  4/25 - Intubated and admitted to the ICU    INTERVAL HISTORY: Febrile overnight with Tmax 103 and also hypotensive. Did not respond to fluid bolus yesterday. Sedated on fentanyl and propofol. Arouses to voice and follows commands.   VITAL SIGNS: Temp:  [98 F (36.7 C)-99.8 F (37.7 C)] 99.8 F (37.7 C) (04/27 0737) Pulse Rate:  [61-88] 81 (04/27 0900) Resp:  [13-22] 22 (04/27 0900) BP: (75-119)/(54-86) 101/66 (04/27 0900) SpO2:  [76 %-99 %] 90 % (04/27 0900) FiO2 (%):  [50 %-60 %] 50 % (04/27 0748) Weight:  [153 lb 3.5 oz (69.5 kg)] 153 lb 3.5 oz (69.5 kg) (04/27 0346) HEMODYNAMICS:   VENTILATOR SETTINGS: Vent Mode: PRVC FiO2 (%):  [50 %-60 %] 50 % Set Rate:  [20 bmp] 20 bmp Vt Set:  [450 mL] 450 mL PEEP:  [8 cmH20-10 cmH20] 10 cmH20 Plateau Pressure:  [24 cmH20-27 cmH20] 26 cmH20 INTAKE / OUTPUT: Intake/Output      04/26 0701 - 04/27 0700 04/27 0701 - 04/28 0700   P.O. 100    I.V. (mL/kg) 2230.8 (32.1) 177.3 (2.6)   Other 570    NG/GT  1370.7 90   IV Piggyback 1460 10   Total Intake(mL/kg) 5731.5 (82.5) 277.3 (4)   Urine (mL/kg/hr) 1060 (0.6) 150 (0.6)   Total Output 1060 150   Net +4671.5 +127.3         PHYSICAL EXAMINATION: General:  Intubated and sedated Neuro:  Sedated and intubated, agitated off sedation HEENT:  [place]/AT, PERRL, EOM-I and MMM Cardiovascular:  RRR, Nl S1/S2 and -M/R/G. Lungs:  Coarse BS diffusely Abdomen:  Soft, NT, ND and +BS Musculoskeletal:  -edema and -tenderness Skin:  No rashes or lesions noted   LABS: Cbc Recent Labs  Lab 01/27/18 0324 01/28/18 0330 01/29/18 0322  WBC 32.8* 20.7* 20.7*  HGB 12.4 10.2* 10.1*  HCT 37.5 32.7* 32.4*  PLT 374 254 279   Chemistry  Recent Labs  Lab 01/27/18 0324  01/27/18 1547 01/28/18 0330 01/29/18 0322  NA 138  --   --  138 139  K 3.7  --   --  3.8 3.9  CL 104  --   --  107 107  CO2 19*  --   --  21* 25  BUN 11  --   --  9 11  CREATININE 0.61  --   --  0.52 0.40*  CALCIUM 7.7*  --   --  7.9* 8.6*  MG 1.1*   < > 2.0  1.6* 1.7  PHOS  --   --   --  3.0 2.9  GLUCOSE 91  --   --  156* 141*   < > = values in this interval not displayed.   Liver fxn Recent Labs  Lab 01/27/18 0057  AST 27  ALT 17  ALKPHOS 115  BILITOT 0.4  PROT 5.9*  ALBUMIN 2.5*   coags Recent Labs  Lab 01/27/18 0057  INR 0.98   Sepsis markers Recent Labs  Lab 01/27/18 0111 01/27/18 0311 01/27/18 0324 01/27/18 0633  LATICACIDVEN 2.13* 1.04  --  0.7  PROCALCITON  --   --  0.98  --    Cardiac markers No results for input(s): CKTOTAL, CKMB, TROPONINI in the last 168 hours.   BNP No results for input(s): PROBNP in the last 168 hours.   ABG Recent Labs  Lab 01/27/18 0524 01/27/18 0624 01/27/18 0741  PHART 7.178* 7.154* 7.234*  PCO2ART 57.5* 63.1* 55.8*  PO2ART 102.0 278.0* 136.0*  HCO3 21.3 22.2 23.6  TCO2 23 24 25    CBG trend Recent Labs  Lab 01/28/18 1536 01/28/18 2002 01/28/18 2343 01/29/18 0330 01/29/18 0739  GLUCAP 122* 118* 149* 137*  112*   IMAGING: diffuse alveolar infiltrates bilaterally slightly worsen than yesterday   DIAGNOSES: Principal Problem:   Acute respiratory failure with hypoxia (HCC) Active Problems:   Alcohol abuse   Sepsis due to pneumonia ([place])   Hypokalemia   Prolonged QT interval   Acute respiratory failure with hypoxia and hypercapnia (HCC)   Hypomagnesemia   Community acquired pneumonia  ASSESSMENT / PLAN:  PULMONARY A:  Acute hypoxic and hypercapneic respiratory failure, concern for ARDS  Severe, bilateral CAP -  Hx of asthma - on Brovana and Pulmicort at home  Current smoker - less than 0.5ppd per PCP's note 4/24  PLAN:   - Maintain on full vent support - VAP protocol - F/U cultures - Scheduled duonebs q6h  - Albuterol q4h PRN  - Pulmicort nebs BID   CARDIOVASCULAR A: Sepsis from bilateral pneumonia  Prolonged QTc - 571 ms  HTN - previously on ACEi No history of cardiovascular disease - TTE 2016 with EF 65%, no wall motion abnormalities or diastolic dysfunction, normal RV function   PLAN:  - Continue to monitor K and Mag and replete as needed  - Titrate neo for MAP of 65 - MAP goal > 65  RENAL A:   Normal renal function at baseline with adequate UOP  Hypokalemia  Hypomagnesemia   PLAN:   - BMET in AM - Monitor UOP  - Replace electrolytes as indicated - Lasix 20 mg IV q6 x3 doses  GASTROINTESTINAL A: Protein calorie malnutrition likely 2/2 alcohol abuse  SUP   PLAN:   - IV famotidine for GI ppx   - TF per nutrition   HEMATOLOGIC A:   Leukocytosis in the setting of sepsis from severe CAP - improving  Macrocytosis - likely from alcohol use  VTE ppx   PLAN:  - Daily CBC  - SQ Lovenox for VTE ppx   INFECTIOUS A:  Sepsis from bilateral pneumonia   PLAN:   - Strep pneum antigen negative  - Follow up Legionella antigen, respiratory and blood cultures  - Vanc, rocephin and doxycycline  ENDOCRINE A:   No acute issues  PLAN:   - CBG monitoring  q4h with CBG goal < 180  NEUROLOGIC A: Alcohol use disorder - unclear severity but MCV 105 and protein malnutrition on labs consistent  with this  Intubated and sedated  PLAN:   - On propofol and fentanyl gtt with RASS goal 1- to -2  - CIWA once off propofol, Ativan ordered   - Continue folic acid and thiamine  - D/C propofol - Precedex as ordered  The patient is critically ill with multiple organ systems failure and requires high complexity decision making for assessment and support, frequent evaluation and titration of therapies, application of advanced monitoring technologies and extensive interpretation of multiple databases.   Critical Care Time devoted to patient care services described in this note is  33  Minutes. This time reflects time of care of this signee Dr Jennet Maduro. This critical care time does not reflect procedure time, or teaching time or supervisory time of PA/NP/Med student/Med Resident etc but could involve care discussion time.  Rush Farmer, M.D. [facility] Pulmonary/Critical Care Medicine. Pager: [phone]. After hours pager: [phone].  01/29/2018, 10:34 AM

## 2018-01-30 ENCOUNTER — Inpatient Hospital Stay (HOSPITAL_COMMUNITY): Payer: BLUE CROSS/BLUE SHIELD

## 2018-01-30 LAB — GLUCOSE, CAPILLARY
Glucose-Capillary: 127 mg/dL — ABNORMAL HIGH (ref 65–99)
Glucose-Capillary: 146 mg/dL — ABNORMAL HIGH (ref 65–99)
Glucose-Capillary: 144 mg/dL — ABNORMAL HIGH (ref 65–99)
Glucose-Capillary: 134 mg/dL — ABNORMAL HIGH (ref 65–99)
Glucose-Capillary: 160 mg/dL — ABNORMAL HIGH (ref 65–99)

## 2018-01-30 LAB — MAGNESIUM
Magnesium: 1.3 mg/dL — ABNORMAL LOW (ref 1.7–2.4)
Magnesium: 4.4 mg/dL — ABNORMAL HIGH (ref 1.7–2.4)

## 2018-01-30 LAB — BASIC METABOLIC PANEL
Anion gap: 10 (ref 5–15)
BUN: 11 mg/dL (ref 6–20)
CO2: 29 mmol/L (ref 22–32)
Calcium: 8.6 mg/dL — ABNORMAL LOW (ref 8.9–10.3)
Chloride: 102 mmol/L (ref 101–111)
Creatinine, Ser: 0.42 mg/dL — ABNORMAL LOW (ref 0.44–1.00)
GFR calc Af Amer: >60 mL/min (ref >60)
GFR calc non Af Amer: >60 mL/min (ref >60)
Glucose, Bld: 132 mg/dL — ABNORMAL HIGH (ref 65–99)
Potassium: 3.3 mmol/L — ABNORMAL LOW (ref 3.5–5.1)
Sodium: 141 mmol/L (ref 135–145)

## 2018-01-30 LAB — POCT I-STAT 3, ART BLOOD GAS (G3+)
Acid-Base Excess: 3 mmol/L — ABNORMAL HIGH (ref 0.0–2.0)
Bicarbonate: 28.4 mmol/L — ABNORMAL HIGH (ref 20.0–28.0)
O2 Saturation: 97 %
Patient temperature: 98
TCO2: 30 mmol/L (ref 22–32)
pCO2 arterial: 46 mmHg (ref 32.0–48.0)
pH, Arterial: 7.396 (ref 7.350–7.450)
pO2, Arterial: 91 mmHg (ref 83.0–108.0)

## 2018-01-30 LAB — CBC
HCT: 31.3 % — ABNORMAL LOW (ref 36.0–46.0)
Hemoglobin: 10.1 g/dL — ABNORMAL LOW (ref 12.0–15.0)
MCH: 35.3 pg — ABNORMAL HIGH (ref 26.0–34.0)
MCHC: 32.3 g/dL (ref 30.0–36.0)
MCV: 109.4 fL — ABNORMAL HIGH (ref 78.0–100.0)
Platelets: 284 10*3/uL (ref 150–400)
RBC: 2.86 MIL/uL — ABNORMAL LOW (ref 3.87–5.11)
RDW: 12.6 % (ref 11.5–15.5)
WBC: 14.6 10*3/uL — ABNORMAL HIGH (ref 4.0–10.5)

## 2018-01-30 LAB — PHOSPHORUS: Phosphorus: 4.2 mg/dL (ref 2.5–4.6)

## 2018-01-30 LAB — TRIGLYCERIDES: Triglycerides: 173 mg/dL — ABNORMAL HIGH (ref <150)

## 2018-01-30 MED ORDER — POTASSIUM CHLORIDE 20 MEQ/15ML (10%) PO SOLN
40.0000 meq | Freq: Two times a day (BID) | ORAL | Status: AC
  Administered 2018-01-30 (×2): 40 meq via ORAL
  Filled 2018-01-30 (×2): qty 30

## 2018-01-30 MED ORDER — FENTANYL CITRATE (PF) 2500 MCG/50ML IJ SOLN
25.0000 ug/h | Status: DC
  Administered 2018-01-30 – 2018-01-31 (×3): 400 ug/h via INTRAVENOUS
  Administered 2018-02-01: 300 ug/h via INTRAVENOUS
  Administered 2018-02-02: 400 ug/h via INTRAVENOUS
  Administered 2018-02-02: 300 ug/h via INTRAVENOUS
  Administered 2018-02-03 (×2): 400 ug/h via INTRAVENOUS
  Administered 2018-02-03: 500 ug/h via INTRAVENOUS
  Administered 2018-02-04 (×2): 400 ug/h via INTRAVENOUS
  Administered 2018-02-05 (×2): 100 ug/h via INTRAVENOUS
  Filled 2018-01-30 (×14): qty 100

## 2018-01-30 MED ORDER — POLYETHYLENE GLYCOL 3350 17 G PO PACK
17.0000 g | PACK | Freq: Every day | ORAL | Status: DC
  Administered 2018-01-30 – 2018-02-13 (×13): 17 g
  Filled 2018-01-30 (×15): qty 1

## 2018-01-30 MED ORDER — MAGNESIUM SULFATE 4 GM/100ML IV SOLN
4.0000 g | Freq: Once | INTRAVENOUS | Status: AC
  Administered 2018-01-30: 4 g via INTRAVENOUS
  Filled 2018-01-30: qty 100

## 2018-01-30 MED ORDER — POTASSIUM CHLORIDE 20 MEQ/15ML (10%) PO SOLN
40.0000 meq | Freq: Three times a day (TID) | ORAL | Status: AC
  Administered 2018-01-30 (×2): 40 meq
  Filled 2018-01-30 (×2): qty 30

## 2018-01-30 MED ORDER — SODIUM CHLORIDE 0.9 % IV SOLN
1.0000 mg/h | INTRAVENOUS | Status: DC
  Administered 2018-01-30: 1 mg/h via INTRAVENOUS
  Administered 2018-01-30: 4 mg/h via INTRAVENOUS
  Administered 2018-01-31: 5 mg/h via INTRAVENOUS
  Administered 2018-02-01: 4 mg/h via INTRAVENOUS
  Administered 2018-02-01: 2 mg/h via INTRAVENOUS
  Administered 2018-02-02 (×2): 4 mg/h via INTRAVENOUS
  Filled 2018-01-30 (×6): qty 10

## 2018-01-30 NOTE — Progress Notes (Signed)
PULMONARY  / CRITICAL CARE MEDICINE  Name: Natalie Morrison MRN: [id number] DOB: 1971/01/25    LOS: 69  REFERRING MD :  Dr. Mitzi Hansen   CHIEF COMPLAINT:  Acute hypercapnic respiratory failure   BRIEF PATIENT DESCRIPTION: 47 yo F with PMH asthma (no PFTs to review), HTN, and long history of alcohol use disorder who presented with sinus congestion and cough for 1 month. Seen by PCP on day prior to presentation 4/24 and prescribed Augmentin x 10 days, unclear if started. In the ED, CXR showed bilateral pneumonia and antibiotic therapy started. She was initially admitted to SDU, but acutely decompensated becoming cyanotic and hypoxic and required intubation while in the ED and admission to ICU.   LINES / TUBES: ETT/OG 4/25>  PIV x3 LUE x2 and RUE x1 4/25 Foley catheter 4/25  CULTURES: Respiratory cx 4/25  pending Blood cx x2 4/25 pending   ANTIBIOTICS: Vancomycin 4/25>  Doxycycline 4/25>  Rocephin 4/25>    SIGNIFICANT EVENTS:  4/25 - Intubated and admitted to the ICU    INTERVAL HISTORY:  Afebrile, no events, no new complaints  VITAL SIGNS: Temp:  [98.1 F (36.7 C)-99.1 F (37.3 C)] 98.1 F (36.7 C) (04/28 0801) Pulse Rate:  [52-102] 69 (04/28 0900) Resp:  [15-24] 20 (04/28 0900) BP: (81-163)/(53-119) 111/76 (04/28 0900) SpO2:  [84 %-100 %] 99 % (04/28 0900) FiO2 (%):  [60 %-70 %] 60 % (04/28 0800) Weight:  [156 lb 8.4 oz (71 kg)] 156 lb 8.4 oz (71 kg) (04/28 0500) HEMODYNAMICS:   VENTILATOR SETTINGS: Vent Mode: PRVC FiO2 (%):  [60 %-70 %] 60 % Set Rate:  [20 bmp] 20 bmp Vt Set:  [440 mL-450 mL] 440 mL PEEP:  [12 cmH20] 12 cmH20 Plateau Pressure:  [23 cmH20-27 cmH20] 23 cmH20 INTAKE / OUTPUT: Intake/Output      04/27 0701 - 04/28 0700 04/28 0701 - 04/29 0700   P.O.     I.V. (mL/kg) 2225.8 (31.3) 168.6 (2.4)   Other     NG/GT 1195 105   IV Piggyback 890    Total Intake(mL/kg) 4310.8 (60.7) 273.6 (3.9)   Urine (mL/kg/hr) 4930 (2.9) 100 (0.4)   Total Output  4930 100   Net -619.2 +173.6         PHYSICAL EXAMINATION: General:  Intubated and awake and following commands Neuro:  Alert, interactive and moving all ext to command HEENT:  [place]/AT, PERRL, EOM-I and MMM Cardiovascular:  RRR, Nl S1/S and -M/R/G. Lungs:  Diffuse crackles Abdomen:  Soft, NT, ND and +BS Musculoskeletal:  -edema and -tenderness Skin:  No rashes or lesions noted   LABS: Cbc Recent Labs  Lab 01/28/18 0330 01/29/18 0322 01/30/18 0650  WBC 20.7* 20.7* 14.6*  HGB 10.2* 10.1* 10.1*  HCT 32.7* 32.4* 31.3*  PLT 254 279 284   Chemistry  Recent Labs  Lab 01/28/18 0330 01/29/18 0322 01/30/18 0650  NA 138 139 141  K 3.8 3.9 3.3*  CL 107 107 102  CO2 21* 25 29  BUN 9 11 11   CREATININE 0.52 0.40* 0.42*  CALCIUM 7.9* 8.6* 8.6*  MG 1.6* 1.7 1.3*  PHOS 3.0 2.9 4.2  GLUCOSE 156* 141* 132*   Liver fxn Recent Labs  Lab 01/27/18 0057  AST 27  ALT 17  ALKPHOS 115  BILITOT 0.4  PROT 5.9*  ALBUMIN 2.5*   coags Recent Labs  Lab 01/27/18 0057  INR 0.98   Sepsis markers Recent Labs  Lab 01/27/18 0111 01/27/18 0311 01/27/18 0324  01/27/18 5631  LATICACIDVEN 2.13* 1.04  --  0.7  PROCALCITON  --   --  0.98  --    Cardiac markers No results for input(s): CKTOTAL, CKMB, TROPONINI in the last 168 hours.   BNP No results for input(s): PROBNP in the last 168 hours.   ABG Recent Labs  Lab 01/27/18 0624 01/27/18 0741 01/30/18 0448  PHART 7.154* 7.234* 7.396  PCO2ART 63.1* 55.8* 46.0  PO2ART 278.0* 136.0* 91.0  HCO3 22.2 23.6 28.4*  TCO2 24 25 30    CBG trend Recent Labs  Lab 01/29/18 1617 01/29/18 1949 01/29/18 2346 01/30/18 0346 01/30/18 0803  GLUCAP 132* 126* 121* 127* 146*   IMAGING: diffuse alveolar infiltrates bilaterally slightly worsen than yesterday   DIAGNOSES: Principal Problem:   Acute respiratory failure with hypoxia (HCC) Active Problems:   Alcohol abuse   Sepsis due to pneumonia ([place])   Hypokalemia   Prolonged QT  interval   Acute respiratory failure with hypoxia and hypercapnia (HCC)   Hypomagnesemia   Community acquired pneumonia  ASSESSMENT / PLAN:  PULMONARY A:  Acute hypoxic and hypercapneic respiratory failure, concern for ARDS  Severe, bilateral CAP -  Hx of asthma - on Brovana and Pulmicort at home  Current smoker - less than 0.5ppd per PCP's note 4/24  PLAN:   - Maintain on full vent support - VAP protocol - F/U on cultures - Scheduled duonebs q6h  - Albuterol q4h PRN  - Pulmicort nebs BID   CARDIOVASCULAR A: Sepsis from bilateral pneumonia  Prolonged QTc - 571 ms  HTN - previously on ACEi No history of cardiovascular disease - TTE 2016 with EF 65%, no wall motion abnormalities or diastolic dysfunction, normal RV function   PLAN:  - Titrate neo for MAP of 65 - MAP goal > 65  RENAL A:   Normal renal function at baseline with adequate UOP  Hypokalemia  Hypomagnesemia   PLAN:   - BMET in AM - Monitor UOP  - Replace electrolytes as indicated - Lasix 40 mg IV q8 x2 doses  GASTROINTESTINAL A: Protein calorie malnutrition likely 2/2 alcohol abuse  SUP   PLAN:   - IV famotidine for GI ppx   - TF per nutrition   HEMATOLOGIC A:   Leukocytosis in the setting of sepsis from severe CAP - improving  Macrocytosis - likely from alcohol use  VTE ppx   PLAN:  - Daily CBC  - SQ Lovenox for VTE ppx   INFECTIOUS A:  Sepsis from bilateral pneumonia   PLAN:   - Strep pneum antigen negative  - Follow up Legionella antigen, respiratory and blood cultures  - Vanc, rocephin and doxycycline  ENDOCRINE A:   No acute issues  PLAN:   - CBG monitoring q4h with CBG goal < 180  NEUROLOGIC A: Alcohol use disorder - unclear severity but MCV 105 and protein malnutrition on labs consistent with this  Intubated and sedated  PLAN:   - CIWA once off propofol, Ativan ordered   - Continue folic acid and thiamine  - Versed drip with limit of 5 mg/hr - Precedex as  ordered  The patient is critically ill with multiple organ systems failure and requires high complexity decision making for assessment and support, frequent evaluation and titration of therapies, application of advanced monitoring technologies and extensive interpretation of multiple databases.   Critical Care Time devoted to patient care services described in this note is  32  Minutes. This time reflects time of care of  this signee Dr Jennet Maduro. This critical care time does not reflect procedure time, or teaching time or supervisory time of PA/NP/Med student/Med Resident etc but could involve care discussion time.  Rush Farmer, M.D. [facility] Pulmonary/Critical Care Medicine. Pager: [phone]. After hours pager: [phone].  01/30/2018, 10:23 AM

## 2018-01-30 NOTE — Progress Notes (Signed)
pts spouse Lakeva Hollon given 2 yellow colored rings. 1 yellow colored ring with no stones. The other yellow colored ring with 9 clear stones and 3 blue stones. Given to spouse in urine container. Spouse took home.

## 2018-01-30 NOTE — Progress Notes (Signed)
PULMONARY  / CRITICAL CARE MEDICINE  Name: Natalie Morrison MRN: [id number] DOB: 31-Jul-1971    LOS: 54  REFERRING MD :  Dr. Mitzi Hansen   CHIEF COMPLAINT:  Acute hypercapnic respiratory failure   BRIEF PATIENT DESCRIPTION: 47 yo F with PMH asthma (no PFTs to review), HTN, and long history of alcohol use disorder who presented with sinus congestion and cough for 1 month. Seen by PCP on day prior to presentation 4/24 and prescribed Augmentin x 10 days, unclear if started. In the ED, CXR showed bilateral pneumonia and antibiotic therapy started. She was initially admitted to SDU, but acutely decompensated becoming cyanotic and hypoxic and required intubation while in the ED and admission to ICU.   LINES / TUBES: ETT/OG 4/25>  PIV x3 LUE x2 and RUE x1 4/25 Foley catheter 4/25  CULTURES: Respiratory cx 4/25  pending Blood cx x2 4/25 pending   ANTIBIOTICS: Vancomycin 4/25> 4/26 Doxycycline 4/25>  Rocephin 4/25>    SIGNIFICANT EVENTS:  4/25 - Intubated and admitted to the ICU    INTERVAL HISTORY: Afebrile and hypertensive overnight. Sedated and does not arouse to voice.   VITAL SIGNS: Temp:  [98.6 F (37 C)-99.8 F (37.7 C)] 98.7 F (37.1 C) (04/27 1951) Pulse Rate:  [53-102] 60 (04/28 0600) Resp:  [13-24] 20 (04/28 0600) BP: (81-158)/(53-95) 107/78 (04/28 0500) SpO2:  [76 %-100 %] 95 % (04/28 0600) FiO2 (%):  [50 %-70 %] 60 % (04/28 0400) Weight:  [156 lb 8.4 oz (71 kg)] 156 lb 8.4 oz (71 kg) (04/28 0500) HEMODYNAMICS:   VENTILATOR SETTINGS: Vent Mode: PRVC FiO2 (%):  [50 %-70 %] 60 % Set Rate:  [20 bmp] 20 bmp Vt Set:  [450 mL] 450 mL PEEP:  [10 cmH20-12 cmH20] 12 cmH20 Plateau Pressure:  [23 cmH20-27 cmH20] 27 cmH20 INTAKE / OUTPUT: Intake/Output      04/27 0701 - 04/28 0700 04/28 0701 - 04/29 0700   P.O.     I.V. (mL/kg) 2225.8 (31.3)    Other     NG/GT 1150    IV Piggyback 890    Total Intake(mL/kg) 4265.8 (60.1)    Urine (mL/kg/hr) 4930 (2.9)    Total  Output 4930    Net -664.2          PHYSICAL EXAMINATION: General:  Intubated and sedated Neuro:  Sedated and intubated, does not arouse to voice or follows commands  HEENT:  [place]/AT, PERRL, EET and OG tubes in place  Cardiovascular:  RRR, Nl S1/S2 and no mrg  Lungs:  coarse breath soudns diffusely Abdomen:  Soft, NT, ND and +BS Musculoskeletal:  No deformities or lower extremity swelling  Skin:  No rashes or lesions noted   LABS: Cbc Recent Labs  Lab 01/27/18 0324 01/28/18 0330 01/29/18 0322  WBC 32.8* 20.7* 20.7*  HGB 12.4 10.2* 10.1*  HCT 37.5 32.7* 32.4*  PLT 374 254 279   Chemistry  Recent Labs  Lab 01/27/18 0324  01/27/18 1547 01/28/18 0330 01/29/18 0322  NA 138  --   --  138 139  K 3.7  --   --  3.8 3.9  CL 104  --   --  107 107  CO2 19*  --   --  21* 25  BUN 11  --   --  9 11  CREATININE 0.61  --   --  0.52 0.40*  CALCIUM 7.7*  --   --  7.9* 8.6*  MG 1.1*   < > 2.0 1.6*  1.7  PHOS  --   --   --  3.0 2.9  GLUCOSE 91  --   --  156* 141*   < > = values in this interval not displayed.   Liver fxn Recent Labs  Lab 01/27/18 0057  AST 27  ALT 17  ALKPHOS 115  BILITOT 0.4  PROT 5.9*  ALBUMIN 2.5*   coags Recent Labs  Lab 01/27/18 0057  INR 0.98   Sepsis markers Recent Labs  Lab 01/27/18 0111 01/27/18 0311 01/27/18 0324 01/27/18 0633  LATICACIDVEN 2.13* 1.04  --  0.7  PROCALCITON  --   --  0.98  --    Cardiac markers No results for input(s): CKTOTAL, CKMB, TROPONINI in the last 168 hours.   BNP No results for input(s): PROBNP in the last 168 hours.   ABG Recent Labs  Lab 01/27/18 0624 01/27/18 0741 01/30/18 0448  PHART 7.154* 7.234* 7.396  PCO2ART 63.1* 55.8* 46.0  PO2ART 278.0* 136.0* 91.0  HCO3 22.2 23.6 28.4*  TCO2 24 25 30    CBG trend Recent Labs  Lab 01/29/18 1212 01/29/18 1617 01/29/18 1949 01/29/18 2346 01/30/18 0346  GLUCAP 93 132* 126* 121* 127*   IMAGING: diffuse alveolar infiltrates bilaterally slightly worsen  than yesterday   DIAGNOSES: Principal Problem:   Acute respiratory failure with hypoxia (HCC) Active Problems:   Alcohol abuse   Sepsis due to pneumonia ([place])   Hypokalemia   Prolonged QT interval   Acute respiratory failure with hypoxia and hypercapnia (HCC)   Hypomagnesemia   Community acquired pneumonia  ASSESSMENT / PLAN:  PULMONARY A:  Acute hypoxic and hypercapneic respiratory failure, concern for ARDS  Severe, bilateral CAP   Hx of asthma - on Brovana and Pulmicort at home  Current smoker - less than 0.5ppd per PCP's note 4/24  PLAN:    - Maintain on full vent support - VAP protocol - Culture date negative thus far   - Scheduled duonebs q6h  - Albuterol q4h PRN  - Pulmicort nebs BID    CARDIOVASCULAR A: Sepsis from bilateral pneumonia  Prolonged QTc - 571 ms  HTN - previously on ACEi No history of cardiovascular disease - TTE 2016 with EF 65%, no wall motion abnormalities or diastolic dysfunction, normal RV function   PLAN:  - Continue to monitor K and Mag and replete as needed  - Titrate neo for MAP of 65  RENAL A:   Normal renal function at baseline with adequate UOP  Hypokalemia  Hypomagnesemia   PLAN:   - S/p IV Lasix 20 mg q6h 4/27 - BMET in AM - Monitor UOP  - Replace electrolytes as indicate  GASTROINTESTINAL A: Protein calorie malnutrition likely 2/2 alcohol abuse  SUP   PLAN:   - IV famotidine for GI ppx   - TF per nutrition   HEMATOLOGIC A:   Leukocytosis in the setting of sepsis from severe CAP - improving  Macrocytosis - likely from alcohol use  VTE ppx   PLAN:  - Daily CBC  - SQ Lovenox for VTE ppx   INFECTIOUS A:  Sepsis from bilateral pneumonia   PLAN:   - Strep pneum and legionella antigen negative  - Trach aspirate and Bcx x2 negative to date  - Continue rocephin and doxycycline  ENDOCRINE A:   No acute issues  PLAN:   - CBG monitoring q4h with CBG goal < 180  NEUROLOGIC A: Alcohol use disorder -  unclear severity but MCV 105 and protein malnutrition  on labs consistent with this  Intubated and sedated  PLAN:   - On fentanyl and precedex gtt with RASS goal 1- to -2  - CIWA once off propofol, Ativan ordered   - Continue folic acid and thiamine    Welford Roche, MD  Internal Medicine PGY-1  P [phone] 01/30/2018, 7:19 AM

## 2018-01-31 ENCOUNTER — Inpatient Hospital Stay (HOSPITAL_COMMUNITY): Payer: BLUE CROSS/BLUE SHIELD

## 2018-01-31 DIAGNOSIS — Z789 Other specified health status: Secondary | ICD-10-CM

## 2018-01-31 LAB — GLUCOSE, CAPILLARY
Glucose-Capillary: 108 mg/dL — ABNORMAL HIGH (ref 65–99)
Glucose-Capillary: 117 mg/dL — ABNORMAL HIGH (ref 65–99)
Glucose-Capillary: 129 mg/dL — ABNORMAL HIGH (ref 65–99)
Glucose-Capillary: 132 mg/dL — ABNORMAL HIGH (ref 65–99)
Glucose-Capillary: 140 mg/dL — ABNORMAL HIGH (ref 65–99)
Glucose-Capillary: 146 mg/dL — ABNORMAL HIGH (ref 65–99)

## 2018-01-31 LAB — BASIC METABOLIC PANEL
Anion gap: 6 (ref 5–15)
BUN: 17 mg/dL (ref 6–20)
CO2: 29 mmol/L (ref 22–32)
Calcium: 8.9 mg/dL (ref 8.9–10.3)
Chloride: 106 mmol/L (ref 101–111)
Creatinine, Ser: 0.31 mg/dL — ABNORMAL LOW (ref 0.44–1.00)
GFR calc Af Amer: >60 mL/min (ref >60)
GFR calc non Af Amer: >60 mL/min (ref >60)
Glucose, Bld: 128 mg/dL — ABNORMAL HIGH (ref 65–99)
Potassium: 4.3 mmol/L (ref 3.5–5.1)
Sodium: 141 mmol/L (ref 135–145)

## 2018-01-31 LAB — CBC
HCT: 32.7 % — ABNORMAL LOW (ref 36.0–46.0)
Hemoglobin: 10.3 g/dL — ABNORMAL LOW (ref 12.0–15.0)
MCH: 34.8 pg — ABNORMAL HIGH (ref 26.0–34.0)
MCHC: 31.5 g/dL (ref 30.0–36.0)
MCV: 110.5 fL — ABNORMAL HIGH (ref 78.0–100.0)
Platelets: 327 10*3/uL (ref 150–400)
RBC: 2.96 MIL/uL — ABNORMAL LOW (ref 3.87–5.11)
RDW: 12.7 % (ref 11.5–15.5)
WBC: 15.8 10*3/uL — ABNORMAL HIGH (ref 4.0–10.5)

## 2018-01-31 LAB — BODY FLUID CELL COUNT WITH DIFFERENTIAL
Eos, Fluid: 3 %
Lymphs, Fluid: 10 %
Monocyte-Macrophage-Serous Fluid: 8 % — ABNORMAL LOW (ref 50–90)
Neutrophil Count, Fluid: 79 % — ABNORMAL HIGH (ref 0–25)
Total Nucleated Cell Count, Fluid: UNDETERMINED cu mm (ref 0–1000)

## 2018-01-31 LAB — MAGNESIUM: Magnesium: 1.5 mg/dL — ABNORMAL LOW (ref 1.7–2.4)

## 2018-01-31 LAB — BLOOD GAS, ARTERIAL
Acid-Base Excess: 5.1 mmol/L — ABNORMAL HIGH (ref 0.0–2.0)
Bicarbonate: 29.8 mmol/L — ABNORMAL HIGH (ref 20.0–28.0)
Drawn by: 31394
FIO2: 70
MECHVT: 450 mL
O2 Saturation: 96.8 %
PEEP: 12 cmH2O
Patient temperature: 98.6
RATE: 20 resp/min
pCO2 arterial: 50 mmHg — ABNORMAL HIGH (ref 32.0–48.0)
pH, Arterial: 7.393 (ref 7.350–7.450)
pO2, Arterial: 89.1 mmHg (ref 83.0–108.0)

## 2018-01-31 LAB — PHOSPHORUS: Phosphorus: 3.6 mg/dL (ref 2.5–4.6)

## 2018-01-31 MED ORDER — FUROSEMIDE 10 MG/ML IJ SOLN
40.0000 mg | Freq: Once | INTRAMUSCULAR | Status: AC
  Administered 2018-01-31: 40 mg via INTRAVENOUS
  Filled 2018-01-31: qty 4

## 2018-01-31 MED ORDER — BACITRACIN-POLYMYXIN B 500-10000 UNIT/GM OP OINT
TOPICAL_OINTMENT | Freq: Four times a day (QID) | OPHTHALMIC | Status: DC
  Administered 2018-01-31 – 2018-02-01 (×7): via OPHTHALMIC
  Administered 2018-02-01 – 2018-02-02 (×2): 1 via OPHTHALMIC
  Administered 2018-02-02 (×3): via OPHTHALMIC
  Administered 2018-02-03 – 2018-02-05 (×12): 1 via OPHTHALMIC
  Administered 2018-02-06 – 2018-02-07 (×5): via OPHTHALMIC
  Administered 2018-02-07: 1 via OPHTHALMIC
  Administered 2018-02-07 – 2018-02-08 (×5): via OPHTHALMIC
  Administered 2018-02-08: 1 via OPHTHALMIC
  Administered 2018-02-09 – 2018-02-11 (×10): via OPHTHALMIC
  Administered 2018-02-11: 1 via OPHTHALMIC
  Administered 2018-02-12 – 2018-02-14 (×8): via OPHTHALMIC
  Administered 2018-02-14 (×2): 1 via OPHTHALMIC
  Administered 2018-02-15 – 2018-02-16 (×5): via OPHTHALMIC
  Filled 2018-01-31 (×4): qty 3.5

## 2018-01-31 MED ORDER — SENNOSIDES 8.8 MG/5ML PO SYRP
5.0000 mL | ORAL_SOLUTION | Freq: Every day | ORAL | Status: DC
  Administered 2018-01-31 – 2018-02-13 (×11): 5 mL
  Filled 2018-01-31 (×15): qty 5

## 2018-01-31 MED ORDER — MAGNESIUM SULFATE 2 GM/50ML IV SOLN
2.0000 g | Freq: Once | INTRAVENOUS | Status: AC
  Administered 2018-01-31: 2 g via INTRAVENOUS
  Filled 2018-01-31: qty 50

## 2018-01-31 MED ORDER — POLYVINYL ALCOHOL 1.4 % OP SOLN
1.0000 [drp] | OPHTHALMIC | Status: DC
  Administered 2018-01-31 – 2018-02-16 (×92): 1 [drp] via OPHTHALMIC
  Filled 2018-01-31: qty 15

## 2018-01-31 MED ORDER — MIDAZOLAM HCL 2 MG/2ML IJ SOLN
4.0000 mg | Freq: Once | INTRAMUSCULAR | Status: AC
  Administered 2018-01-31: 4 mg via INTRAVENOUS

## 2018-01-31 NOTE — Progress Notes (Signed)
Pt with ventilator asynchrony and oxygen sats decreased to mid 80s, versed drip increased from 4mg /hr to 5mg /hr and precedex drip increased from 1.5 mcg to 2 mcg. Oxygen sats increased to 98%.

## 2018-01-31 NOTE — Progress Notes (Signed)
Asked to evaluate the patient for conjunctivitis. On evaluation the patient has scleral/conjuctival edema and erythema that excludes the iris/lens. The patient's eyelids due not close completely. No contact lens are noted.   Exposure Conjunctivitis - Start bacitracin-polymyxin ophthalmic ointment QID  - Monitor closely, can cause vision loss. Unfortunately patient sedated and unable to communicate. May need steroids if progresses.

## 2018-01-31 NOTE — Procedures (Signed)
PCCM Video Bronchoscopy Procedure Note  The patient was informed of the risks (including but not limited to bleeding, infection, respiratory failure, lung injury, tooth/oral injury) and benefits of the procedure and gave consent, see chart.  Indication: Non-resolving pneumonia  Post Procedure Diagnosis: acute respiratory failure with hypoxemia  Location: [facility] ICU  Condition pre procedure: critically ill, on vent  Medications for procedure: versed 60m IV, fentanyl 100 mcg IV  Procedure description: The bronchoscope was introduced through the endotracheal tube and passed to the bilateral lungs to the level of the subsegmental bronchi throughout the tracheobronchial tree.  Airway exam revealed a sharp carina, no clear airway lesions. There was acute bronchitis changes in the bilateral lungs but no clear airway lesions.  Visualization was limited by frequent coughin.   There was some grey mucus in the left greater than right lung.    Procedures performed: BAL lingula: 40cc infused, 20cc cloudy return, non-bloody  Specimens sent: BAL bacterial culture, fungus culture, AFB culture, cell count with differential  Condition post procedure: critically ill, on vent  EBL: none from procedure  Complications: none immediate  BRoselie Awkward MD [facility]M Pager: [phone]: [phone] 3pm or if no response, call [phone]

## 2018-01-31 NOTE — Progress Notes (Signed)
PULMONARY  / CRITICAL CARE MEDICINE  Name: Natalie Morrison MRN: [id number] DOB: January 08, 1971    LOS: 15  REFERRING MD :  Dr. Mitzi Hansen   CHIEF COMPLAINT:  Acute hypercapnic respiratory failure   BRIEF PATIENT DESCRIPTION: 47 yo F with PMH asthma (no PFTs to review), HTN, and long history of alcohol use disorder who presented with sinus congestion and cough for 1 month. Seen by PCP on day prior to presentation 4/24 and prescribed Augmentin x 10 days, unclear if started. In the ED, CXR showed bilateral pneumonia and antibiotic therapy started. She was initially admitted to SDU, but acutely decompensated becoming cyanotic and hypoxic and required intubation while in the ED and admission to ICU.   LINES / TUBES: ETT/OG 4/25>  PIV x3 LUE x2 and RUE x1 4/25 Foley catheter 4/25  CULTURES: Respiratory cx 4/25 with normal respiratory flora  Blood cx x2 4/25 negative at 3 days   ANTIBIOTICS: Vancomycin 4/25> 4/26 Doxycycline 4/25>  Rocephin 4/25>    SIGNIFICANT EVENTS:  4/25 - Intubated and admitted to the ICU    INTERVAL HISTORY: Afebrile and hemodynamically stable on neo. Started on versed gtt overnight due to agitation.   VITAL SIGNS: Temp:  [98.1 F (36.7 C)-98.8 F (37.1 C)] 98.6 F (37 C) (04/29 0359) Pulse Rate:  [63-87] 67 (04/29 0441) Resp:  [15-26] 21 (04/29 0441) BP: (85-133)/(56-85) 133/85 (04/29 0441) SpO2:  [87 %-99 %] 99 % (04/29 0441) FiO2 (%):  [50 %-80 %] 60 % (04/29 0441) Weight:  [162 lb 0.6 oz (73.5 kg)] 162 lb 0.6 oz (73.5 kg) (04/29 0500) HEMODYNAMICS:   VENTILATOR SETTINGS: Vent Mode: PRVC FiO2 (%):  [50 %-80 %] 60 % Set Rate:  [20 bmp] 20 bmp Vt Set:  [440 mL-450 mL] 450 mL PEEP:  [12 cmH20] 12 cmH20 Plateau Pressure:  [23 cmH20-27 cmH20] 27 cmH20 INTAKE / OUTPUT: Intake/Output      04/28 0701 - 04/29 0700 04/29 0701 - 04/30 0700   I.V. (mL/kg) 1250.8 (17)    NG/GT 1020    IV Piggyback 500    Total Intake(mL/kg) 2770.8 (37.7)    Urine  (mL/kg/hr) 550 (0.3)    Total Output 550    Net +2220.8          PHYSICAL EXAMINATION: General:  Intubated and sedated Neuro:  Sedated and intubated, does not arouse to voice or follows commands  HEENT:  [place]/AT, PERRL, marked conjunctival injection and lower conjunctival swelling bilaterally, minimal serous discharge noted, EET and OG tubes in place  Cardiovascular:  RRR, Nl S1/S2 and no mrg  Lungs:  coarse breath sounds diffusely Abdomen:  Soft, NT, ND and +BS Musculoskeletal:  No deformities or lower extremity swelling  Skin:  No rashes or lesions noted   LABS: Cbc Recent Labs  Lab 01/29/18 0322 01/30/18 0650 01/31/18 0500  WBC 20.7* 14.6* 15.8*  HGB 10.1* 10.1* 10.3*  HCT 32.4* 31.3* 32.7*  PLT 279 284 327   Chemistry  Recent Labs  Lab 01/29/18 0322 01/30/18 0650 01/30/18 1300 01/31/18 0500  NA 139 141  --  141  K 3.9 3.3*  --  4.3  CL 107 102  --  106  CO2 25 29  --  29  BUN 11 11  --  17  CREATININE 0.40* 0.42*  --  0.31*  CALCIUM 8.6* 8.6*  --  8.9  MG 1.7 1.3* 4.4* 1.5*  PHOS 2.9 4.2  --  3.6  GLUCOSE 141* 132*  --  128*   Liver fxn Recent Labs  Lab 01/27/18 0057  AST 27  ALT 17  ALKPHOS 115  BILITOT 0.4  PROT 5.9*  ALBUMIN 2.5*   coags Recent Labs  Lab 01/27/18 0057  INR 0.98   Sepsis markers Recent Labs  Lab 01/27/18 0111 01/27/18 0311 01/27/18 0324 01/27/18 0633  LATICACIDVEN 2.13* 1.04  --  0.7  PROCALCITON  --   --  0.98  --    Cardiac markers No results for input(s): CKTOTAL, CKMB, TROPONINI in the last 168 hours.   BNP No results for input(s): PROBNP in the last 168 hours.   ABG Recent Labs  Lab 01/27/18 0624 01/27/18 0741 01/30/18 0448 01/31/18 0436  PHART 7.154* 7.234* 7.396 7.393  PCO2ART 63.1* 55.8* 46.0 50.0*  PO2ART 278.0* 136.0* 91.0 89.1  HCO3 22.2 23.6 28.4* 29.8*  TCO2 24 25 30   --    CBG trend Recent Labs  Lab 01/30/18 0803 01/30/18 1229 01/30/18 1628 01/30/18 1956 01/30/18 2357  GLUCAP 146*  144* 134* 160* 132*   IMAGING: diffuse alveolar infiltrates bilaterally worsen than yesterday   DIAGNOSES: Principal Problem:   Acute respiratory failure with hypoxia (HCC) Active Problems:   Alcohol abuse   Sepsis due to pneumonia ([place])   Hypokalemia   Prolonged QT interval   Acute respiratory failure with hypoxia and hypercapnia (HCC)   Hypomagnesemia   Community acquired pneumonia  ASSESSMENT / PLAN:  PULMONARY A:  Acute hypoxic and hypercapneic respiratory failure, concern for ARDS  Severe, bilateral CAP   Hx of asthma - on Brovana and Pulmicort at home  Current smoker - less than 0.5ppd per PCP's note 4/24  PLAN:    - Maintain on full vent support. Trying to wean, maintain O2 sats > 90% - VAP protocol - Culture date negative thus far   - Scheduled duonebs q6h  - Albuterol q4h PRN  - Pulmicort nebs BID   CARDIOVASCULAR A: Sepsis from bilateral pneumonia  Prolonged QTc - 571 ms  HTN - previously on ACEi No history of cardiovascular disease - TTE 2016 with EF 65%, no wall motion abnormalities or diastolic dysfunction, normal RV function   PLAN:  - Continue to monitor K and Mag and replete as needed  - Titrate neo for MAP of 65  RENAL A:   Normal renal function at baseline with adequate UOP  Hypokalemia  Hypomagnesemia   PLAN:   - S/p IV Lasix 20 mg q6h 4/27. Will diurese today again with IV Lasix 20 mg x3  - BMET in AM - Monitor UOP  - Replace electrolytes as indicate  GASTROINTESTINAL A: Protein calorie malnutrition likely 2/2 alcohol abuse  SUP   PLAN:   - IV famotidine for GI ppx   - TF per nutrition   HEMATOLOGIC A:   Leukocytosis in the setting of sepsis from severe CAP - improving  Macrocytosis - likely from alcohol use  VTE ppx   PLAN:  - Daily CBC  - SQ Lovenox for VTE ppx   INFECTIOUS A:  Sepsis from bilateral pneumonia   PLAN:   - Strep pneum and legionella antigen negative  - Trach aspirate and Bcx x2 negative to date  -  Continue rocephin and doxycycline, Day 6   ENDOCRINE A:   No acute issues  PLAN:   - CBG monitoring q4h with CBG goal < 180  NEUROLOGIC A: Alcohol use disorder - unclear severity but MCV 105 and protein malnutrition on labs consistent with this  Intubated and sedated  PLAN:   - On fentanyl and precedex gtt with RASS goal 1- to -2  - Now also on versed gtt, will attempt to wean today  - CIWA once off propofol, Ativan ordered   - Continue folic acid and thiamine    Welford Roche, MD  Internal Medicine PGY-1  P [phone] 01/31/2018, 7:35 AM

## 2018-02-01 ENCOUNTER — Inpatient Hospital Stay (HOSPITAL_COMMUNITY): Payer: BLUE CROSS/BLUE SHIELD

## 2018-02-01 LAB — BASIC METABOLIC PANEL
Anion gap: 7 (ref 5–15)
BUN: 21 mg/dL — ABNORMAL HIGH (ref 6–20)
CO2: 32 mmol/L (ref 22–32)
Calcium: 8.7 mg/dL — ABNORMAL LOW (ref 8.9–10.3)
Chloride: 102 mmol/L (ref 101–111)
Creatinine, Ser: 0.44 mg/dL (ref 0.44–1.00)
GFR calc Af Amer: >60 mL/min (ref >60)
GFR calc non Af Amer: >60 mL/min (ref >60)
Glucose, Bld: 128 mg/dL — ABNORMAL HIGH (ref 65–99)
Potassium: 3.9 mmol/L (ref 3.5–5.1)
Sodium: 141 mmol/L (ref 135–145)

## 2018-02-01 LAB — CBC
HCT: 30 % — ABNORMAL LOW (ref 36.0–46.0)
Hemoglobin: 9.5 g/dL — ABNORMAL LOW (ref 12.0–15.0)
MCH: 34.8 pg — ABNORMAL HIGH (ref 26.0–34.0)
MCHC: 31.7 g/dL (ref 30.0–36.0)
MCV: 109.9 fL — ABNORMAL HIGH (ref 78.0–100.0)
Platelets: 331 10*3/uL (ref 150–400)
RBC: 2.73 MIL/uL — ABNORMAL LOW (ref 3.87–5.11)
RDW: 12.9 % (ref 11.5–15.5)
WBC: 17 10*3/uL — ABNORMAL HIGH (ref 4.0–10.5)

## 2018-02-01 LAB — CULTURE, BLOOD (ROUTINE X 2)
Culture: NO GROWTH
Culture: NO GROWTH

## 2018-02-01 LAB — ACID FAST SMEAR (AFB, MYCOBACTERIA): Acid Fast Smear: NEGATIVE

## 2018-02-01 LAB — GLUCOSE, CAPILLARY
Glucose-Capillary: 116 mg/dL — ABNORMAL HIGH (ref 65–99)
Glucose-Capillary: 130 mg/dL — ABNORMAL HIGH (ref 65–99)
Glucose-Capillary: 131 mg/dL — ABNORMAL HIGH (ref 65–99)
Glucose-Capillary: 136 mg/dL — ABNORMAL HIGH (ref 65–99)
Glucose-Capillary: 142 mg/dL — ABNORMAL HIGH (ref 65–99)
Glucose-Capillary: 155 mg/dL — ABNORMAL HIGH (ref 65–99)

## 2018-02-01 LAB — MAGNESIUM: Magnesium: 1.4 mg/dL — ABNORMAL LOW (ref 1.7–2.4)

## 2018-02-01 LAB — PHOSPHORUS: Phosphorus: 4.8 mg/dL — ABNORMAL HIGH (ref 2.5–4.6)

## 2018-02-01 LAB — PATHOLOGIST SMEAR REVIEW

## 2018-02-01 MED ORDER — FUROSEMIDE 10 MG/ML IJ SOLN
40.0000 mg | Freq: Two times a day (BID) | INTRAMUSCULAR | Status: AC
  Administered 2018-02-01 (×2): 40 mg via INTRAVENOUS
  Filled 2018-02-01 (×2): qty 4

## 2018-02-01 MED ORDER — POTASSIUM CHLORIDE 20 MEQ/15ML (10%) PO SOLN
40.0000 meq | Freq: Once | ORAL | Status: AC
  Administered 2018-02-01: 40 meq
  Filled 2018-02-01: qty 30

## 2018-02-01 MED ORDER — MAGNESIUM SULFATE 4 GM/100ML IV SOLN
4.0000 g | Freq: Once | INTRAVENOUS | Status: AC
  Administered 2018-02-01: 4 g via INTRAVENOUS
  Filled 2018-02-01: qty 100

## 2018-02-01 MED ORDER — ACETYLCYSTEINE 20 % IN SOLN
4.0000 mL | Freq: Two times a day (BID) | RESPIRATORY_TRACT | Status: DC
  Administered 2018-02-01 – 2018-02-02 (×3): 4 mL via RESPIRATORY_TRACT
  Filled 2018-02-01 (×3): qty 4

## 2018-02-01 NOTE — Progress Notes (Addendum)
Nutrition Follow-up  DOCUMENTATION CODES:   Not applicable  INTERVENTION:  Continue vital AF 1.2 @ 15m/hr (13259m24hrs) providing 1584 kcal, 99 grams protein, and 1069 ml H2O. Meets 98% of kcal needs and 100% of protein needs. D/c pro-stat  NUTRITION DIAGNOSIS:   Inadequate oral intake related to inability to eat as evidenced by NPO status.  Ongoing  GOAL:   Provide needs based on ASPEN/SCCM guidelines  Met with TF  MONITOR:   Vent status, TF tolerance, I & O's, Skin, Labs, Weight trends  REASON FOR ASSESSMENT:   Consult Enteral/tube feeding initiation and management  ASSESSMENT:   [age].o. F admitted on 01/27/18 for acute respiratory failure with hypoxia with subsequent sepsis from pneumonia - possible ARDS. PMH of alcohol abuse, depression/anxeity, asthma.  Per MD note pt still has concern for ARDS. Per rounds pt tolerating TF well, pt now on bowel regimen. Pt sleeping upon dietetic intern visit.   Vital AF 1.2 @ 4535mr with Prostat TID.   Medications reviewed: pulmicort, colace, folvite, novolog 0-9 units, miralax, senokot, thiamine, 0.9% sodium chloride 250m46mrecedex, doxycycline, pepcid, fentanyl, magnesium, versed, phenylephrine.   MAP: 64  MVe: 10.3  Temp (24hrs), Avg:98.7 F (37.1 C), Min:97.9 F (36.6 C), Max:99.7 F (37.6 C)  Labs reviewed:   Intake/Output Summary (Last 24 hours) at 02/01/2018 1338 Last data filed at 02/01/2018 1000 Gross per 24 hour  Intake 2432.79 ml  Output 1870 ml  Net 562.79 ml   Diet Order:   Diet Order           Diet NPO time specified  Diet effective now          EDUCATION NEEDS:   Not appropriate for education at this time  Skin:  Skin Assessment: Reviewed RN Assessment  Last BM:  Unknown  Height:   Ht Readings from Last 1 Encounters:  01/27/18 '5\' 6"'$  (1.676 m)    Weight:   Wt Readings from Last 1 Encounters:  02/01/18 156 lb 4.9 oz (70.9 kg)   Pt weight has been increasing since 01/27/18, pt up  7.9 kg (about 17 lbs); potential fluid accumulation.  Ideal Body Weight:  59.09 kg  BMI:  Body mass index is 25.23 kg/m.  Estimated Nutritional Needs:   Kcal:  1615 kcal   Protein:  95-110 grams  Fluid:  >/= 1.7 or per MD    MeliHope Buddsetetic Intern

## 2018-02-01 NOTE — Care Management Note (Signed)
Case Management Note  Patient Details  Name: Natalie Morrison MRN: [id number] Date of Birth: [birth year]  Subjective/Objective:     Pt admitted with severe CAP - failed outpt treatment            Action/Plan:   PTA independent from home.  Pt remains ventilated.     Expected Discharge Date:                  Expected Discharge Plan:  Home/Self Care  In-House Referral:     Discharge planning Services  CM Consult  Post Acute Care Choice:    Choice offered to:     DME Arranged:    DME Agency:     HH Arranged:    HH Agency:     Status of Service:     If discussed at [employer] of Stay Meetings, dates discussed:    Additional Comments:  Maryclare Labrador, RN 02/01/2018, 3:24 PM

## 2018-02-01 NOTE — Progress Notes (Signed)
Called by RN due to low MVe and SATs low but not picking up well. Upon arrival, RN was bagging patient due to not moving any air. Placed patient back on ventilator to suction, however, catheter wouldn't pass. Changed catheter as there were tenacious secretions on catheter. Bagged more and lavaged patient suctioning out a couple mucous plugs. Catheter passing easily now. Patient getting good volumes. Vitals stable.

## 2018-02-01 NOTE — Progress Notes (Signed)
PULMONARY  / CRITICAL CARE MEDICINE  Name: Natalie Morrison MRN: [id number] DOB: 04-24-1971    LOS: 63  REFERRING MD :  Dr. Mitzi Hansen   CHIEF COMPLAINT:  Acute hypercapnic respiratory failure   BRIEF PATIENT DESCRIPTION: 47 yo F with PMH asthma (no PFTs to review), HTN, and long history of alcohol use disorder who presented with sinus congestion and cough for 1 month. Seen by PCP on day prior to presentation 4/24 and prescribed Augmentin x 10 days, unclear if started. In the ED, CXR showed bilateral pneumonia and antibiotic therapy started. She was initially admitted to SDU, but acutely decompensated becoming cyanotic and hypoxic and required intubation while in the ED and admission to ICU.   LINES / TUBES: ETT/OG 4/25>  PIV x3 LUE x2 and RUE x1 4/25 Foley catheter 4/25  CULTURES: Respiratory cx 4/25 with normal respiratory flora  Blood cx x2 4/25 negative at 3 days  BAL fungal, bacterial, and AFB cultures pending   ANTIBIOTICS: Vancomycin 4/25> 4/26 Doxycycline 4/25>  Rocephin 4/25>    SIGNIFICANT EVENTS:  4/25 - Intubated and admitted to the ICU  4/29 - Bronchoscopy with BAL   INTERVAL HISTORY: Afebrile and hemodynamically stable. Off neo since 3AM. Remains on Fentanyl, Precedex, and Versed. Desatted this AM, thick secretions and mucus plugging noted on suctioning. Sats now improved.   VITAL SIGNS: Temp:  [98 F (36.7 C)-99.7 F (37.6 C)] 98 F (36.7 C) (04/30 0436) Pulse Rate:  [68-136] 127 (04/30 0615) Resp:  [16-29] 17 (04/30 0615) BP: (94-141)/(61-88) 141/88 (04/30 0614) SpO2:  [69 %-100 %] 97 % (04/30 0615) FiO2 (%):  [50 %-100 %] 100 % (04/30 0615) Weight:  [156 lb 4.9 oz (70.9 kg)] 156 lb 4.9 oz (70.9 kg) (04/30 0453) HEMODYNAMICS:   VENTILATOR SETTINGS: Vent Mode: PRVC FiO2 (%):  [50 %-100 %] 100 % Set Rate:  [20 bmp] 20 bmp Vt Set:  [450 mL] 450 mL PEEP:  [12 cmH20] 12 cmH20 Plateau Pressure:  [20 cmH20-26 cmH20] 26 cmH20 INTAKE /  OUTPUT: Intake/Output      04/29 0701 - 04/30 0700 04/30 0701 - 05/01 0700   I.V. (mL/kg) 1614.1 (22.8)    NG/GT 1215    IV Piggyback 350    Total Intake(mL/kg) 3179.1 (44.8)    Urine (mL/kg/hr) 3970 (2.3)    Total Output 3970    Net -790.9          PHYSICAL EXAMINATION: General:  Intubated and sedated Neuro:  Sedated and intubated, does not arouse to voice or follows commands  HEENT:  [place]/AT, PERRL, marked conjunctival injection and lower conjunctival swelling bilaterally, minimal serous discharge noted, EET and OG tubes in place  Cardiovascular:  RRR, nl S1/S2 and no mrg  Lungs:  coarse breath sounds diffusely Abdomen:  Soft, NT, ND and +BS Musculoskeletal:  No deformities or lower extremity swelling  Skin:  No rashes or lesions noted   LABS: Cbc Recent Labs  Lab 01/29/18 0322 01/30/18 0650 01/31/18 0500  WBC 20.7* 14.6* 15.8*  HGB 10.1* 10.1* 10.3*  HCT 32.4* 31.3* 32.7*  PLT 279 284 327   Chemistry  Recent Labs  Lab 01/29/18 0322 01/30/18 0650 01/30/18 1300 01/31/18 0500  NA 139 141  --  141  K 3.9 3.3*  --  4.3  CL 107 102  --  106  CO2 25 29  --  29  BUN 11 11  --  17  CREATININE 0.40* 0.42*  --  0.31*  CALCIUM  8.6* 8.6*  --  8.9  MG 1.7 1.3* 4.4* 1.5*  PHOS 2.9 4.2  --  3.6  GLUCOSE 141* 132*  --  128*   Liver fxn Recent Labs  Lab 01/27/18 0057  AST 27  ALT 17  ALKPHOS 115  BILITOT 0.4  PROT 5.9*  ALBUMIN 2.5*   coags Recent Labs  Lab 01/27/18 0057  INR 0.98   Sepsis markers Recent Labs  Lab 01/27/18 0111 01/27/18 0311 01/27/18 0324 01/27/18 0633  LATICACIDVEN 2.13* 1.04  --  0.7  PROCALCITON  --   --  0.98  --    Cardiac markers No results for input(s): CKTOTAL, CKMB, TROPONINI in the last 168 hours.   BNP No results for input(s): PROBNP in the last 168 hours.   ABG Recent Labs  Lab 01/27/18 0624 01/27/18 0741 01/30/18 0448 01/31/18 0436  PHART 7.154* 7.234* 7.396 7.393  PCO2ART 63.1* 55.8* 46.0 50.0*  PO2ART 278.0*  136.0* 91.0 89.1  HCO3 22.2 23.6 28.4* 29.8*  TCO2 _0 --    CBG trend Recent Labs  Lab 01/31/18 1130 01/31/18 1551 01/31/18 2048 01/31/18 2358 02/01/18 0436  GLUCAP 146* 117* 140* 136* 131*   IMAGING: diffuse alveolar infiltrates bilaterally unchanged from yesterday   DIAGNOSES: Principal Problem:   Acute respiratory failure with hypoxia (HCC) Active Problems:   Alcohol abuse   Sepsis due to pneumonia (HCC)   Hypokalemia   Prolonged QT interval   Acute respiratory failure with hypoxia and hypercapnia (HCC)   Hypomagnesemia   Community acquired pneumonia   Airway intubation performed without difficulty  ASSESSMENT / PLAN:  PULMONARY A:  Acute hypoxic and hypercapneic respiratory failure, concern for ARDS  Severe, bilateral CAP   Hx of asthma - on Brovana and Pulmicort at home  Current smoker - less than 0.5ppd per PCP's note 4/24  PLAN:    - Maintain on full vent support. Trying to wean, maintain O2 sats > 90% - VAP protocol - S/p bronchoscopy and BAL 4/29. Bacterial, fungal, and AFB cultures pending. Unable to obtain WBC from sample  - Scheduled duonebs q6h  - Albuterol q4h PRN  - Pulmicort nebs BID  - Mucomyst 31m BID  - Chest physiotherapy   CARDIOVASCULAR A: Sepsis from bilateral pneumonia  Prolonged QTc - 571 ms  HTN - previously on ACEi No history of cardiovascular disease - TTE 2016 with EF 65%, no wall motion abnormalities or diastolic dysfunction, normal RV function   PLAN:  - Continue to monitor K and Mag and replete as needed  - Titrate neo for MAP of 65  RENAL A:   Normal renal function at baseline with adequate UOP  Hypokalemia  Hypomagnesemia   PLAN:   - S/p IV Lasix 40 x1 yesterday, only -800cc. IV Lasix 40 mg x2 today  - BMET in AM - Monitor UOP  - Replace electrolytes as indicate  GASTROINTESTINAL A: Protein calorie malnutrition likely 2/2 alcohol abuse  SUP   PLAN:   - IV famotidine for GI ppx   - TF per nutrition    HEMATOLOGIC A:   Leukocytosis in the setting of sepsis from severe CAP - improving  Macrocytosis - likely from alcohol use  VTE ppx   PLAN:  - Daily CBC  - SQ Lovenox for VTE ppx   INFECTIOUS A:  Sepsis from bilateral pneumonia   PLAN:   - Strep pneum and legionella antigen negative  - Trach aspirate and Bcx x2 negative to date  -  S/p bronchoscopy and BAL 4/29. Bacterial, fungal, and AFB cultures pending. Unable to obtain WBC from sample  - Continue rocephin and doxycycline, Day 7  ENDOCRINE A:   No acute issues  PLAN:   - CBG monitoring q4h with CBG goal < 180  NEUROLOGIC A: Alcohol use disorder - unclear severity but MCV 105 and protein malnutrition on labs consistent with this  Intubated and sedated  PLAN:   - On fentanyl, versed, and precedex gtt with RASS goal 1- to -2  - CIWA once off propofol, Ativan ordered   - Continue folic acid and thiamine    Welford Roche, MD  Internal Medicine PGY-1  P [phone] 02/01/2018, 7:19 AM

## 2018-02-02 ENCOUNTER — Inpatient Hospital Stay (HOSPITAL_COMMUNITY): Payer: BLUE CROSS/BLUE SHIELD

## 2018-02-02 LAB — CBC
HCT: 30.3 % — ABNORMAL LOW (ref 36.0–46.0)
Hemoglobin: 9.7 g/dL — ABNORMAL LOW (ref 12.0–15.0)
MCH: 34.8 pg — ABNORMAL HIGH (ref 26.0–34.0)
MCHC: 32 g/dL (ref 30.0–36.0)
MCV: 108.6 fL — ABNORMAL HIGH (ref 78.0–100.0)
Platelets: 329 10*3/uL (ref 150–400)
RBC: 2.79 MIL/uL — ABNORMAL LOW (ref 3.87–5.11)
RDW: 13 % (ref 11.5–15.5)
WBC: 15.4 10*3/uL — ABNORMAL HIGH (ref 4.0–10.5)

## 2018-02-02 LAB — GLUCOSE, CAPILLARY
Glucose-Capillary: 117 mg/dL — ABNORMAL HIGH (ref 65–99)
Glucose-Capillary: 125 mg/dL — ABNORMAL HIGH (ref 65–99)
Glucose-Capillary: 128 mg/dL — ABNORMAL HIGH (ref 65–99)
Glucose-Capillary: 130 mg/dL — ABNORMAL HIGH (ref 65–99)
Glucose-Capillary: 146 mg/dL — ABNORMAL HIGH (ref 65–99)
Glucose-Capillary: 150 mg/dL — ABNORMAL HIGH (ref 65–99)
Glucose-Capillary: 99 mg/dL (ref 65–99)

## 2018-02-02 LAB — BASIC METABOLIC PANEL
Anion gap: 11 (ref 5–15)
BUN: 18 mg/dL (ref 6–20)
CO2: 34 mmol/L — ABNORMAL HIGH (ref 22–32)
Calcium: 8.8 mg/dL — ABNORMAL LOW (ref 8.9–10.3)
Chloride: 96 mmol/L — ABNORMAL LOW (ref 101–111)
Creatinine, Ser: 0.4 mg/dL — ABNORMAL LOW (ref 0.44–1.00)
GFR calc Af Amer: >60 mL/min (ref >60)
GFR calc non Af Amer: >60 mL/min (ref >60)
Glucose, Bld: 105 mg/dL — ABNORMAL HIGH (ref 65–99)
Potassium: 3.2 mmol/L — ABNORMAL LOW (ref 3.5–5.1)
Sodium: 141 mmol/L (ref 135–145)

## 2018-02-02 LAB — MAGNESIUM: Magnesium: 1.9 mg/dL (ref 1.7–2.4)

## 2018-02-02 LAB — PHOSPHORUS: Phosphorus: 4.8 mg/dL — ABNORMAL HIGH (ref 2.5–4.6)

## 2018-02-02 MED ORDER — POTASSIUM CHLORIDE 20 MEQ/15ML (10%) PO SOLN
30.0000 meq | ORAL | Status: AC
  Administered 2018-02-02 (×2): 30 meq
  Filled 2018-02-02 (×2): qty 30

## 2018-02-02 MED ORDER — FUROSEMIDE 10 MG/ML IJ SOLN
40.0000 mg | Freq: Two times a day (BID) | INTRAMUSCULAR | Status: AC
  Administered 2018-02-02 (×2): 40 mg via INTRAVENOUS
  Filled 2018-02-02 (×2): qty 4

## 2018-02-02 MED ORDER — POTASSIUM CHLORIDE 20 MEQ/15ML (10%) PO SOLN
40.0000 meq | Freq: Two times a day (BID) | ORAL | Status: AC
  Administered 2018-02-02 – 2018-02-03 (×3): 40 meq
  Filled 2018-02-02 (×3): qty 30

## 2018-02-02 MED ORDER — CLONAZEPAM 1 MG PO TABS
2.0000 mg | ORAL_TABLET | Freq: Two times a day (BID) | ORAL | Status: DC
  Administered 2018-02-02 – 2018-02-03 (×3): 2 mg
  Filled 2018-02-02 (×3): qty 2

## 2018-02-02 MED ORDER — BISACODYL 10 MG RE SUPP
10.0000 mg | Freq: Once | RECTAL | Status: AC
  Administered 2018-02-02: 10 mg via RECTAL
  Filled 2018-02-02: qty 1

## 2018-02-02 NOTE — Progress Notes (Signed)
PULMONARY / CRITICAL CARE MEDICINE   Name: Natalie Morrison MRN: [id number] DOB: [birth year]    ADMISSION DATE:  01/27/2018 CONSULTATION DATE:  01/27/2018  REFERRING MD:  Opyd  CHIEF COMPLAINT:  dyspnea  HISTORY OF PRESENT ILLNESS:   47 y/o female with asthma, EtOH abuse here with ARDS.  Past Medical History:  Diagnosis Date  . Alcohol abuse   . Anemia   . Anxiety   . Anxiety and depression   . Asthma   . Depression   . Hepatitis A    "when I was a kid"  . Hypertension      SUBJECTIVE:  Fever overnight Oxygenation better Remains agitated  VITAL SIGNS: BP 111/74   Pulse 79   Temp 98.7 F (37.1 C) (Rectal)   Resp (!) 23   Ht _0  (1.676 m)   Wt 154 lb 1.6 oz (69.9 kg)   SpO2 100%   BMI 24.87 kg/m   HEMODYNAMICS:    VENTILATOR SETTINGS: Vent Mode: PRVC FiO2 (%):  [40 %-50 %] 40 % Set Rate:  [20 bmp] 20 bmp Vt Set:  [480 mL] 480 mL PEEP:  [5 cmH20-12 cmH20] 5 cmH20 Plateau Pressure:  [18 cmH20-27 cmH20] 18 cmH20  INTAKE / OUTPUT: I/O last 3 completed shifts: In: 3904.1 [I.V.:1779.1; NG/GT:1575; IV Piggyback:550] Out: 3698 [NIOEV:0350]  PHYSICAL EXAMINATION:  General:  In bed on vent, agitated HENT: NCAT ETT in place PULM: CTA B, vent supported breathing CV: RRR, no mgr GI: BS+, soft, nontender MSK: normal bulk and tone Neuro: agitated, now sedated on vent   LABS:  BMET Recent Labs  Lab 01/31/18 0500 02/01/18 0834 02/02/18 0333  NA 141 141 141  K 4.3 3.9 3.2*  CL 106 102 96*  CO2 29 32 34*  BUN 17 21* 18  CREATININE 0.31* 0.44 0.40*  GLUCOSE 128* 128* 105*    Electrolytes Recent Labs  Lab 01/31/18 0500 02/01/18 0834 02/02/18 0333  CALCIUM 8.9 8.7* 8.8*  MG 1.5* 1.4* 1.9  PHOS 3.6 4.8* 4.8*    CBC Recent Labs  Lab 01/31/18 0500 02/01/18 0834 02/02/18 0333  WBC 15.8* 17.0* 15.4*  HGB 10.3* 9.5* 9.7*  HCT 32.7* 30.0* 30.3*  PLT 327 331 329    Coag's Recent Labs  Lab 01/27/18 0057  INR 0.98    Sepsis  Markers Recent Labs  Lab 01/27/18 0111 01/27/18 0311 01/27/18 0324 01/27/18 0633  LATICACIDVEN 2.13* 1.04  --  0.7  PROCALCITON  --   --  0.98  --     ABG Recent Labs  Lab 01/27/18 0741 01/30/18 0448 01/31/18 0436  PHART 7.234* 7.396 7.393  PCO2ART 55.8* 46.0 50.0*  PO2ART 136.0* 91.0 89.1    Liver Enzymes Recent Labs  Lab 01/27/18 0057  AST 27  ALT 17  ALKPHOS 115  BILITOT 0.4  ALBUMIN 2.5*    Cardiac Enzymes No results for input(s): TROPONINI, PROBNP in the last 168 hours.  Glucose Recent Labs  Lab 02/01/18 1146 02/01/18 1529 02/01/18 1942 02/02/18 0014 02/02/18 0342 02/02/18 0730  GLUCAP 155* 142* 130* 125* 99 128*    Imaging Dg Chest Port 1 View  Result Date: 02/02/2018 CLINICAL DATA:  Respiratory difficulty EXAM: PORTABLE CHEST 1 VIEW COMPARISON:  02/01/2018 FINDINGS: Endotracheal tube has been retracted. The tip is now 2.4 cm from the carina. Left subclavian central venous catheter stable. Upper normal heart size. Diffuse airspace opacities are stable. No pneumothorax. IMPRESSION: Stable diffuse airspace opacities. Endotracheal tube tip is now 2.4 cm  from the carina. Electronically Signed   By: Marybelle Killings M.D.   On: 02/02/2018 09:34    LINES / TUBES: ETT/OG 4/25>  PIV x3 LUE x2 and RUE x1 4/25 Foley catheter 4/25  CULTURES: Respiratory cx 4/25 with normal respiratory flora  Blood cx x2 4/25 negative at 3 days  BAL fungal, bacterial, and AFB cultures pending   ANTIBIOTICS: Vancomycin 4/25> 4/26 Doxycycline 4/25>  Rocephin 4/25>    SIGNIFICANT EVENTS:  4/25 - Intubated and admitted to the ICU  4/29 - Bronchoscopy with BAL    DISCUSSION: 47 y/o female with severe CAP causing ARDS.  Has a history of EtOH abuse.   ASSESSMENT / PLAN:  PULMONARY A: ARDS> improving Severe CAP ASthma P:   Full mechanical vent support VAP prevention Daily WUA/SBT pulmicort to continue Continue diuresis today  CARDIOVASCULAR A:  Prolonged  QTc P:  Tele Monitor QTc  RENAL A:   Hypokalemia P:   Monitor BMET and UOP Replace electrolytes as needed   GASTROINTESTINAL A:   Constipation P:   Continue senna, dulcolax miralax and docusate Continue tube feedings  HEMATOLOGIC A:   Anemia without bleeding P:  Monitor for bleeding  INFECTIOUS A:   Severe CAP treated P:   Monitor off of antibiotics  ENDOCRINE A:   Glucose well controlled   P:   contineu SSI  NEUROLOGIC A:   Anxiety, agitation requiring heavy sedation for vent synchrony EtOH abuse P:   RASS goal: 0 to -1 Wean off versed infusion Continue thiamine, folage Fentanyl infusion per PAD protocol, precedex per PAD  FAMILY  - Updates: Husband updated bedside by me  - Inter-disciplinary family meet or Palliative Care meeting due by:  Day 7  My cc time 35 minutes  Roselie Awkward, MD [place] [facility] Pager: [phone] Cell: [phone] After 3pm or if no response, call [phone]   02/02/2018, 10:23 AM

## 2018-02-02 NOTE — Progress Notes (Signed)
[facility] ADULT ICU REPLACEMENT PROTOCOL FOR AM LAB REPLACEMENT ONLY  The patient does apply for the [facility] Adult ICU Electrolyte Replacment Protocol based on the criteria listed below:   1. Is GFR >/= 40 ml/min? Yes.    Patient's GFR today is >60 2. Is urine output >/= 0.5 ml/kg/hr for the last 6 hours? Yes.   Patient's UOP is 2.3 ml/kg/hr 3. Is BUN < 60 mg/dL? Yes.    Patient's BUN today is 18 4. Abnormal electrolyte(s): Potassium 2.3 5. Ordered repletion with: Replaced with Potassium per protocol 6. If a panic level lab has been reported, has the CCM MD in charge been notified? No..   Physician:    Adam Phenix 02/02/2018 5:47 AM

## 2018-02-02 NOTE — Progress Notes (Signed)
Wasted 30 mL of Versed down sink in med room.  Rachael P RN witnessed the waste.

## 2018-02-03 ENCOUNTER — Inpatient Hospital Stay (HOSPITAL_COMMUNITY): Payer: BLUE CROSS/BLUE SHIELD | Admitting: Anesthesiology

## 2018-02-03 ENCOUNTER — Inpatient Hospital Stay (HOSPITAL_COMMUNITY): Payer: BLUE CROSS/BLUE SHIELD

## 2018-02-03 ENCOUNTER — Other Ambulatory Visit: Payer: Self-pay

## 2018-02-03 LAB — CULTURE, RESPIRATORY W GRAM STAIN: Culture: NO GROWTH

## 2018-02-03 LAB — CBC WITH DIFFERENTIAL/PLATELET
Basophils Absolute: 0 10*3/uL (ref 0.0–0.1)
Basophils Relative: 0 %
Eosinophils Absolute: 0.2 10*3/uL (ref 0.0–0.7)
Eosinophils Relative: 1 %
HCT: 30.3 % — ABNORMAL LOW (ref 36.0–46.0)
Hemoglobin: 9.9 g/dL — ABNORMAL LOW (ref 12.0–15.0)
Lymphocytes Relative: 11 %
Lymphs Abs: 2.2 10*3/uL (ref 0.7–4.0)
MCH: 35.6 pg — ABNORMAL HIGH (ref 26.0–34.0)
MCHC: 32.7 g/dL (ref 30.0–36.0)
MCV: 109 fL — ABNORMAL HIGH (ref 78.0–100.0)
Monocytes Absolute: 3.4 10*3/uL — ABNORMAL HIGH (ref 0.1–1.0)
Monocytes Relative: 17 %
Neutro Abs: 14 10*3/uL — ABNORMAL HIGH (ref 1.7–7.7)
Neutrophils Relative %: 71 %
Platelets: 389 10*3/uL (ref 150–400)
RBC: 2.78 MIL/uL — ABNORMAL LOW (ref 3.87–5.11)
RDW: 13.5 % (ref 11.5–15.5)
WBC: 19.8 10*3/uL — ABNORMAL HIGH (ref 4.0–10.5)

## 2018-02-03 LAB — BASIC METABOLIC PANEL
Anion gap: 10 (ref 5–15)
BUN: 15 mg/dL (ref 6–20)
CO2: 32 mmol/L (ref 22–32)
Calcium: 8.6 mg/dL — ABNORMAL LOW (ref 8.9–10.3)
Chloride: 97 mmol/L — ABNORMAL LOW (ref 101–111)
Creatinine, Ser: 0.44 mg/dL (ref 0.44–1.00)
GFR calc Af Amer: >60 mL/min (ref >60)
GFR calc non Af Amer: >60 mL/min (ref >60)
Glucose, Bld: 157 mg/dL — ABNORMAL HIGH (ref 65–99)
Potassium: 3.8 mmol/L (ref 3.5–5.1)
Sodium: 139 mmol/L (ref 135–145)

## 2018-02-03 LAB — GLUCOSE, CAPILLARY
Glucose-Capillary: 153 mg/dL — ABNORMAL HIGH (ref 65–99)
Glucose-Capillary: 102 mg/dL — ABNORMAL HIGH (ref 65–99)
Glucose-Capillary: 149 mg/dL — ABNORMAL HIGH (ref 65–99)
Glucose-Capillary: 126 mg/dL — ABNORMAL HIGH (ref 65–99)
Glucose-Capillary: 111 mg/dL — ABNORMAL HIGH (ref 65–99)

## 2018-02-03 MED ORDER — FREE WATER
100.0000 mL | Freq: Three times a day (TID) | Status: DC
  Administered 2018-02-03 – 2018-02-07 (×12): 100 mL

## 2018-02-03 MED ORDER — QUETIAPINE FUMARATE 50 MG PO TABS
50.0000 mg | ORAL_TABLET | Freq: Two times a day (BID) | ORAL | Status: DC
  Administered 2018-02-03 – 2018-02-04 (×3): 50 mg
  Filled 2018-02-03 (×3): qty 1

## 2018-02-03 MED ORDER — PROPOFOL 10 MG/ML IV BOLUS
INTRAVENOUS | Status: DC | PRN
  Administered 2018-02-03: 100 mg via INTRAVENOUS

## 2018-02-03 MED ORDER — PROPOFOL 10 MG/ML IV BOLUS: INTRAVENOUS | Status: DC | PRN

## 2018-02-03 MED ORDER — SUCCINYLCHOLINE CHLORIDE 20 MG/ML IJ SOLN
INTRAMUSCULAR | Status: DC | PRN
  Administered 2018-02-03: 100 mg via INTRAVENOUS

## 2018-02-03 MED ORDER — CLONAZEPAM 1 MG PO TABS
2.0000 mg | ORAL_TABLET | Freq: Three times a day (TID) | ORAL | Status: DC
  Administered 2018-02-03 – 2018-02-05 (×6): 2 mg
  Filled 2018-02-03 (×6): qty 2

## 2018-02-03 MED ORDER — BISACODYL 10 MG RE SUPP
10.0000 mg | Freq: Once | RECTAL | Status: AC
  Administered 2018-02-03: 10 mg via RECTAL
  Filled 2018-02-03: qty 1

## 2018-02-03 MED ORDER — FUROSEMIDE 10 MG/ML IJ SOLN
INTRAMUSCULAR | Status: AC
  Filled 2018-02-03: qty 4

## 2018-02-03 MED ORDER — NOREPINEPHRINE BITARTRATE 1 MG/ML IV SOLN
0.0000 ug/min | INTRAVENOUS | Status: DC
  Administered 2018-02-03: 5 ug/min via INTRAVENOUS
  Administered 2018-02-04: 1 ug/min via INTRAVENOUS
  Filled 2018-02-03 (×3): qty 4

## 2018-02-03 MED ORDER — BUDESONIDE 0.5 MG/2ML IN SUSP
0.5000 mg | Freq: Two times a day (BID) | RESPIRATORY_TRACT | Status: DC
  Administered 2018-02-03 – 2018-02-05 (×4): 0.5 mg via RESPIRATORY_TRACT
  Filled 2018-02-03 (×4): qty 2

## 2018-02-03 MED ORDER — METHYLNALTREXONE BROMIDE 12 MG/0.6ML ~~LOC~~ SOLN
12.0000 mg | Freq: Once | SUBCUTANEOUS | Status: AC
  Administered 2018-02-03: 12 mg via SUBCUTANEOUS
  Filled 2018-02-03: qty 0.6

## 2018-02-03 NOTE — Progress Notes (Addendum)
Ventilator alarm noted by nurse Harlon Ditty arrived to pt room to find pt had self extubated, bilateral soft wrist restraints secure and intact to pt, ett and ogt lying next to pt. This rn arrived to room to find Nevin Bloodgood, NP nursing staff and RT at bedside, pt placed high fowler position and  NRB facemask applied by nursing. Pt skin color pink warm and dry. Note pt with increased work of breathing, weak cough and audible rhonchi. Pt drowsy, tracking, not talking, able to follow simple command.

## 2018-02-03 NOTE — Progress Notes (Deleted)
Page to Anesthesia placed by nurse Richarda Overlie

## 2018-02-03 NOTE — Progress Notes (Addendum)
Page to Anesthesia placed by nurse Richarda Overlie

## 2018-02-03 NOTE — Progress Notes (Signed)
PULMONARY / CRITICAL CARE MEDICINE   Name: Natalie Morrison MRN: [id number] DOB: [birth year]    ADMISSION DATE:  01/27/2018 CONSULTATION DATE:  01/27/2018  REFERRING MD:  Opyd  CHIEF COMPLAINT:  dyspnea  BRIEF SUMMARY:   47 y/o female with asthma, EtOH abuse admitted CAP, developed ARDS.  Self extubated 5/2 early am requiring reintubation.   SUBJECTIVE:  RN reports pt self extubated overnight, required reintubation.  Difficult to sedate, addition of versed has been helpful.  Remains on fentanyl @ 400 mcg's, precedex at 84mg's.  Tmax 102.1.  I/O- neg 3.1L in last 24 hours, remains 7.2L positive for admit.   VITAL SIGNS: BP 103/71   Pulse 73   Temp 97.7 F (36.5 C) (Oral)   Resp (!) 21   Ht _0  (1.676 m)   Wt 145 lb 15.1 oz (66.2 kg)   SpO2 99%   BMI 23.56 kg/m   HEMODYNAMICS:    VENTILATOR SETTINGS: Vent Mode: PCV FiO2 (%):  [15 %-50 %] 50 % Set Rate:  [20 bmp] 20 bmp PEEP:  [5 cmH20] 5 cmH20 Plateau Pressure:  [19 cmH20-23 cmH20] 20 cmH20  INTAKE / OUTPUT: I/O last 3 completed shifts: In: 3285.3 [I.V.:1910.3; NG/GT:1125; IV Piggyback:250] Out: 7156 [Urine:7156]  PHYSICAL EXAMINATION: General: chronically ill appearing female in NAD on vent  HEENT: MM pink/moist, ETT Neuro: sedate CV: s1s2 rrr, no m/r/g PULM: even/non-labored, lungs bilaterally coarse / rhonchi  GI: soft, non-tender, bsx4 active  Extremities: warm/dry, generalized 1+ edema  Skin: no rashes or lesions  LABS:  BMET Recent Labs  Lab 02/01/18 0834 02/02/18 0333 02/03/18 0500  NA 141 141 139  K 3.9 3.2* 3.8  CL 102 96* 97*  CO2 32 34* 32  BUN 21* 18 15  CREATININE 0.44 0.40* 0.44  GLUCOSE 128* 105* 157*    Electrolytes Recent Labs  Lab 01/31/18 0500 02/01/18 0834 02/02/18 0333 02/03/18 0500  CALCIUM 8.9 8.7* 8.8* 8.6*  MG 1.5* 1.4* 1.9  --   PHOS 3.6 4.8* 4.8*  --     CBC Recent Labs  Lab 02/01/18 0834 02/02/18 0333 02/03/18 0500  WBC 17.0* 15.4* 19.8*  HGB 9.5* 9.7*  9.9*  HCT 30.0* 30.3* 30.3*  PLT 331 329 389    Coag's No results for input(s): APTT, INR in the last 168 hours.  Sepsis Markers No results for input(s): LATICACIDVEN, PROCALCITON, O2SATVEN in the last 168 hours.  ABG Recent Labs  Lab 01/30/18 0448 01/31/18 0436  PHART 7.396 7.393  PCO2ART 46.0 50.0*  PO2ART 91.0 89.1    Liver Enzymes No results for input(s): AST, ALT, ALKPHOS, BILITOT, ALBUMIN in the last 168 hours.  Cardiac Enzymes No results for input(s): TROPONINI, PROBNP in the last 168 hours.  Glucose Recent Labs  Lab 02/02/18 1144 02/02/18 1537 02/02/18 1935 02/02/18 2326 02/03/18 0524 02/03/18 0801  GLUCAP 150* 146* 117* 130* 153* 102*    Imaging Dg Chest Port 1 View  Result Date: 02/03/2018 CLINICAL DATA:  Endotracheal and OG tubes. EXAM: PORTABLE CHEST 1 VIEW COMPARISON:  02/02/2018 FINDINGS: Endotracheal tube tip measures 2.4 cm above the carina. Enteric tube tip is off the field of view but below the left hemidiaphragm. Left central venous catheter tip over the right atrium. No pneumothorax. Shallow inspiration. Normal heart size. Diffuse bilateral airspace disease throughout the lungs. This could be due to edema, pneumonia, or ARDS. Small bilateral pleural effusions. IMPRESSION: Appliances appear in satisfactory position. Diffuse bilateral pulmonary infiltrates without significant change. Electronically Signed  By: Lucienne Capers M.D.   On: 02/03/2018 01:01   Dg Abd Portable 1v  Result Date: 02/02/2018 CLINICAL DATA:  Evaluate for obstruction. No bowel movement for 1 week. EXAM: PORTABLE ABDOMEN - 1 VIEW COMPARISON:  None. FINDINGS: Moderate to large stool burden in the colon. Mild gaseous distention of the sigmoid colon. No small bowel distention to suggest small bowel obstruction. No free air organomegaly. No suspicious calcification. IMPRESSION: Moderate to large stool burden within the colon. Mild gaseous distention of the sigmoid colon, nonspecific. No  convincing evidence for bowel obstruction. Electronically Signed   By: Rolm Baptise M.D.   On: 02/02/2018 11:34    LINES / TUBES: ETT/OG 4/25 >> 5/2, 5/2 >>   CULTURES: Sputum 4/25 >> normal flora  BCx2 4/25 >> negative  BAL 4/29 >> negative  AFB/Fungal 4/29 >>   ANTIBIOTICS: Vancomycin 4/25> 4/26 Doxycycline 4/25 >> 4/30 Rocephin 4/25 >> 4/30  SIGNIFICANT EVENTS:  4/25 Intubated and admitted to the ICU  4/29  Bronchoscopy with BAL 5/02  Self extubated, reintubated   DISCUSSION: 47 y/o female with severe CAP causing ARDS.  Has a history of EtOH abuse.   ASSESSMENT / PLAN:  PULMONARY A: ARDS - improving Severe CAP Asthma P:   PCV 17 above PEEP, R 20, PEEP 5, 50% VAP prevention measures  Daily WUA / SBT  Adjust pulmicort dosing  Duoneb Q6  Follow CXR  Chest PT   CARDIOVASCULAR A:  Prolonged QTc Sedation Related Hypotension  P:  Tele monitoring  Follow QTc PRN levophed for MAP >65 / sedation related   RENAL A:   Hypokalemia P:   Trend BMP / urinary output Replace electrolytes as indicated Avoid nephrotoxic agents, ensure adequate renal perfusion  GASTROINTESTINAL A:   Constipation P:   Continue senna, dulcolax, miralax, colace TF per Nutrition  Pepcid for SUP  Free water 100 ml Q8   HEMATOLOGIC A:   Anemia without bleeding P:  Trend CBC  Monitor for bleeding  Lovenox + SCD's for DVT prophylaxis   INFECTIOUS A:   Severe CAP - treated  Leukocytosis  P:   Monitor off abx  Trend fever curve / WBC  ENDOCRINE A:   Hyperglycemia    P:   SSI, sensitive scale   NEUROLOGIC A:   Anxiety, agitation requiring heavy sedation for vent synchrony Hx Depression, Anxiety, ETOH Abuse P:   RASS goal: -1 to -2 / vent synchrony  Fentanyl gtt for pain / sedation  Precedex gtt  PRN versed Continue thiamine, folate, MVI  Increase Klonopin to 27m Q8   FAMILY  - Updates:  No family at bedside am 5/2.     CC Time: 30 minutes.   BNoe Gens NP-C [facility] Pgr: [phone] or if no answer [phone]/11/2017, 10:42 AM

## 2018-02-03 NOTE — Progress Notes (Signed)
Called Jennelle Human, NP to inform pt sbp 60s. Placed pt supine and fentanyl drip stopped. Will start levophed drip per NP verbal order. NP states to maintain precedex and fentanyl drip and prn versed to keep pt fully sedated

## 2018-02-03 NOTE — Progress Notes (Signed)
Patient is agitated at this time. RT is holding off on CPT.

## 2018-02-03 NOTE — Anesthesia Procedure Notes (Addendum)
Procedure Name: Intubation Date/Time: 02/03/2018 12:39 AM Performed by: Valetta Fuller, CRNA Pre-anesthesia Checklist: Patient identified, Emergency Drugs available, Suction available and Patient being monitored Patient Re-evaluated:Patient Re-evaluated prior to induction Oxygen Delivery Method: Circle system utilized Preoxygenation: Pre-oxygenation with 100% oxygen Induction Type: IV induction, Rapid sequence and Cricoid Pressure applied Laryngoscope Size: Miller and 2 Grade View: Grade III Tube type: Subglottic suction tube Tube size: 7.5 mm Number of attempts: 1 Airway Equipment and Method: Stylet Secured at: 22 cm Tube secured with: securement device. Dental Injury: Teeth and Oropharynx as per pre-operative assessment  Comments: Airway edematous, difficult to identify structures; Intubated on first attempt without trauma.

## 2018-02-03 NOTE — Progress Notes (Signed)
PCCM Interval Progress Note  Called to bedside reference to patient self extubating despite being on fentanyl 400 mcg and precedex 2mg /min.    Currently patient on NRB saturating well, alert, following commands, tachypneic in the 30-40's with increased work of breathing, restless, diffuse rhonchi, and strong cough.  RN reports no significant secretions.  Patient diuresed well and plan was hopeful to wean to extubate soon.  Attempted BIPAP without successful and additional dose of lasix x 1 however patient unable to tolerate BIPAP despite turning precedex back on.  Patient with continued restless, pulling at mask, and increased WOB.   P:  Anesthesia called to intubate CXR and ABG post intubation Resume PAD protocol, continue prn versed q 2 hours   Kennieth Rad, AGACNP-BC [facility] Pgr: [phone] or if no answer [phone] 02/03/2018, 12:41 AM

## 2018-02-04 LAB — GLUCOSE, CAPILLARY
Glucose-Capillary: 107 mg/dL — ABNORMAL HIGH (ref 65–99)
Glucose-Capillary: 112 mg/dL — ABNORMAL HIGH (ref 65–99)
Glucose-Capillary: 118 mg/dL — ABNORMAL HIGH (ref 65–99)
Glucose-Capillary: 123 mg/dL — ABNORMAL HIGH (ref 65–99)
Glucose-Capillary: 126 mg/dL — ABNORMAL HIGH (ref 65–99)
Glucose-Capillary: 129 mg/dL — ABNORMAL HIGH (ref 65–99)
Glucose-Capillary: 129 mg/dL — ABNORMAL HIGH (ref 65–99)

## 2018-02-04 LAB — BASIC METABOLIC PANEL
Anion gap: 7 (ref 5–15)
BUN: 17 mg/dL (ref 6–20)
CO2: 29 mmol/L (ref 22–32)
Calcium: 8.6 mg/dL — ABNORMAL LOW (ref 8.9–10.3)
Chloride: 102 mmol/L (ref 101–111)
Creatinine, Ser: 0.42 mg/dL — ABNORMAL LOW (ref 0.44–1.00)
GFR calc Af Amer: >60 mL/min (ref >60)
GFR calc non Af Amer: >60 mL/min (ref >60)
Glucose, Bld: 123 mg/dL — ABNORMAL HIGH (ref 65–99)
Potassium: 4.2 mmol/L (ref 3.5–5.1)
Sodium: 138 mmol/L (ref 135–145)

## 2018-02-04 LAB — CBC
HCT: 28.1 % — ABNORMAL LOW (ref 36.0–46.0)
Hemoglobin: 8.9 g/dL — ABNORMAL LOW (ref 12.0–15.0)
MCH: 34.5 pg — ABNORMAL HIGH (ref 26.0–34.0)
MCHC: 31.7 g/dL (ref 30.0–36.0)
MCV: 108.9 fL — ABNORMAL HIGH (ref 78.0–100.0)
Platelets: 394 10*3/uL (ref 150–400)
RBC: 2.58 MIL/uL — ABNORMAL LOW (ref 3.87–5.11)
RDW: 13.1 % (ref 11.5–15.5)
WBC: 14.5 10*3/uL — ABNORMAL HIGH (ref 4.0–10.5)

## 2018-02-04 MED ORDER — FUROSEMIDE 10 MG/ML IJ SOLN
40.0000 mg | Freq: Four times a day (QID) | INTRAMUSCULAR | Status: AC
  Administered 2018-02-04 (×2): 40 mg via INTRAVENOUS
  Filled 2018-02-04 (×2): qty 4

## 2018-02-04 MED ORDER — QUETIAPINE FUMARATE 100 MG PO TABS
100.0000 mg | ORAL_TABLET | Freq: Two times a day (BID) | ORAL | Status: DC
  Administered 2018-02-04 – 2018-02-05 (×2): 100 mg
  Filled 2018-02-04 (×3): qty 1

## 2018-02-04 NOTE — Progress Notes (Signed)
PULMONARY / CRITICAL CARE MEDICINE   Name: Natalie Morrison MRN: [id number] DOB: 1970-11-09    ADMISSION DATE:  01/27/2018 CONSULTATION DATE:  01/27/2018  REFERRING MD:  Opyd  CHIEF COMPLAINT:  dyspnea  BRIEF SUMMARY:   47 y/o female with asthma, EtOH abuse admitted CAP, developed ARDS.  Self extubated 5/2 early am requiring reintubation.   SUBJECTIVE:   Minimal agitation this morning but had some overnight Weaning on 5/5 this morning   VITAL SIGNS: BP 107/68   Pulse 78   Temp 100.1 F (37.8 C) (Rectal)   Resp (!) 26   Ht _0  (1.676 m)   Wt 151 lb 14.4 oz (68.9 kg)   SpO2 93%   BMI 24.52 kg/m   HEMODYNAMICS:    VENTILATOR SETTINGS: Vent Mode: PSV;CPAP FiO2 (%):  [40 %-50 %] 40 % Set Rate:  [20 bmp] 20 bmp PEEP:  [5 cmH20] 5 cmH20 Pressure Support:  [5 cmH20] 5 cmH20 Plateau Pressure:  [18 cmH20-23 cmH20] 18 cmH20  INTAKE / OUTPUT: I/O last 3 completed shifts: In: 3718.6 [I.V.:2023.6; NG/GT:1545; IV Piggyback:150] Out: 3790 [Urine:3470]  PHYSICAL EXAMINATION:  General:  In bed on vent HENT: NCAT ETT in place PULM: CTA B, vent supported breathing CV: RRR, no mgr GI: BS+, soft, nontender MSK: normal bulk and tone Neuro: sedated on vent     LABS:  BMET Recent Labs  Lab 02/02/18 0333 02/03/18 0500 02/04/18 0340  NA 141 139 138  K 3.2* 3.8 4.2  CL 96* 97* 102  CO2 34* 32 29  BUN _1 CREATININE 0.40* 0.44 0.42*  GLUCOSE 105* 157* 123*    Electrolytes Recent Labs  Lab 01/31/18 0500 02/01/18 0834 02/02/18 0333 02/03/18 0500 02/04/18 0340  CALCIUM 8.9 8.7* 8.8* 8.6* 8.6*  MG 1.5* 1.4* 1.9  --   --   PHOS 3.6 4.8* 4.8*  --   --     CBC Recent Labs  Lab 02/02/18 0333 02/03/18 0500 02/04/18 0340  WBC 15.4* 19.8* 14.5*  HGB 9.7* 9.9* 8.9*  HCT 30.3* 30.3* 28.1*  PLT 329 389 394    Coag's No results for input(s): APTT, INR in the last 168 hours.  Sepsis Markers No results for input(s): LATICACIDVEN, PROCALCITON, O2SATVEN  in the last 168 hours.  ABG Recent Labs  Lab 01/30/18 0448 01/31/18 0436  PHART 7.396 7.393  PCO2ART 46.0 50.0*  PO2ART 91.0 89.1    Liver Enzymes No results for input(s): AST, ALT, ALKPHOS, BILITOT, ALBUMIN in the last 168 hours.  Cardiac Enzymes No results for input(s): TROPONINI, PROBNP in the last 168 hours.  Glucose Recent Labs  Lab 02/03/18 1612 02/03/18 2004 02/03/18 2359 02/04/18 0348 02/04/18 0731 02/04/18 1113  GLUCAP 126* 111* 129* 112* 107* 126*    Imaging Dg Chest Port 1 View  Result Date: 02/03/2018 CLINICAL DATA:  Hypoxia EXAM: PORTABLE CHEST 1 VIEW COMPARISON:  Study obtained earlier in the day FINDINGS: Endotracheal tube tip is 2.8 cm above the carina. Central catheter tip is in the right atrium just beyond the cavoatrial junction. Nasogastric tube tip and side port are in the stomach. No pneumothorax. There is widespread interstitial and alveolar opacity. Heart is upper normal in size with pulmonary vascularity within normal limits. No evident adenopathy. No bone lesions. IMPRESSION: Tube and catheter positions as described without pneumothorax. Widespread interstitial alveolar opacity bilaterally. Suspect a degree of ARDS. There may well be pulmonary edema and/or pneumonia superimposed as well. The overall appearance is stable compared  to earlier in the day. Stable cardiac silhouette. Electronically Signed   By: Lowella Grip III M.D.   On: 02/03/2018 13:00    LINES / TUBES: ETT/OG 4/25 >> 5/2, 5/2 >>   CULTURES: Sputum 4/25 >> normal flora  BCx2 4/25 >> negative  BAL 4/29 >> negative  AFB/Fungal 4/29 >>   ANTIBIOTICS: Vancomycin 4/25> 4/26 Doxycycline 4/25 >> 4/30 Rocephin 4/25 >> 4/30  SIGNIFICANT EVENTS:  4/25 Intubated and admitted to the ICU  4/29  Bronchoscopy with BAL 5/02  Self extubated, reintubated   DISCUSSION: 47 y/o female with severe CAP causing ARDS.  Has a history of EtOH abuse.   ASSESSMENT /  PLAN:  PULMONARY A: ARDS - improving Severe CAP Asthma P:   Full mechanical vent support VAP prevention Daily WUA/SBT Wean pressure support all day Diurese today Hopeful for extubation in AM  CARDIOVASCULAR A:  Sedation Related Hypotension  P:  Tele Monitor QTc Continue levophed for MAP > 65  RENAL A:   Hypokalemia P:   Monitor BMET and UOP Replace electrolytes as needed  GASTROINTESTINAL A:   Constipation > improved P:   Continue senna, dulcolax, miralax, colace TF per Nutrition Pepcid for SUP Continue free water  HEMATOLOGIC A:   Anemia without bleeding P:  Monitor for bleeding lovenox for DVT prophylaxis  INFECTIOUS A:   Severe CAP - treated  Low grade fever> due to ARDS P:   Monitor for hemodynamic change  ENDOCRINE A:   Hyperglycemia    P:   Monitor glucose, SSI  NEUROLOGIC A:   Anxiety, agitation requiring heavy sedation for vent synchrony Hx Depression, Anxiety, ETOH Abuse P:   Increase seroquel Continue RASS goal -1 to -2 Fentanyl gtt/precedex and prn versed Continue clonazepam   FAMILY  - Updates:  Husband at bedside 5/3    My cc time 34 minutes  Roselie Awkward, MD [facility] Pager: [phone] Cell: [phone] After 3pm or if no response, call [phone]

## 2018-02-04 NOTE — Plan of Care (Signed)
  Problem: Elimination: Goal: Will not experience complications related to bowel motility Outcome: Progressing Note:  Patient given multiple agents to assist in a bowel movement on day shift, as she had not had a BM in 9 days. Patient passed one medium sized loose stool PM shift.    Problem: Coping: Goal: Level of anxiety will decrease Outcome: Not Progressing Note:  Patient continues to get agitated and try to pull out tube. Careful watching and administration of versed/fentanyl prn required to keep her safe and tube in.    Problem: Pain Managment: Goal: General experience of comfort will improve Outcome: Not Progressing

## 2018-02-04 NOTE — Care Management Note (Signed)
Case Management Note  Patient Details  Name: Natalie Morrison MRN: [id number] Date of Birth: [birth year]  Subjective/Objective:     Pt admitted with severe CAP - failed outpt treatment            Action/Plan:   PTA independent from home.  Pt remains ventilated.     Expected Discharge Date:                  Expected Discharge Plan:  Home/Self Care  In-House Referral:     Discharge planning Services  CM Consult  Post Acute Care Choice:    Choice offered to:     DME Arranged:    DME Agency:     HH Arranged:    HH Agency:     Status of Service:     If discussed at [employer] of Stay Meetings, dates discussed:    Additional Comments: 02/04/2018 Pt remains intubated .  Pt is from home with husband.  Pt will need PT eval when appropriate Maryclare Labrador, RN 02/04/2018, 3:32 PM

## 2018-02-05 ENCOUNTER — Inpatient Hospital Stay (HOSPITAL_COMMUNITY): Payer: BLUE CROSS/BLUE SHIELD

## 2018-02-05 LAB — GLUCOSE, CAPILLARY
Glucose-Capillary: 113 mg/dL — ABNORMAL HIGH (ref 65–99)
Glucose-Capillary: 116 mg/dL — ABNORMAL HIGH (ref 65–99)
Glucose-Capillary: 112 mg/dL — ABNORMAL HIGH (ref 65–99)
Glucose-Capillary: 118 mg/dL — ABNORMAL HIGH (ref 65–99)
Glucose-Capillary: 167 mg/dL — ABNORMAL HIGH (ref 65–99)
Glucose-Capillary: 154 mg/dL — ABNORMAL HIGH (ref 65–99)

## 2018-02-05 LAB — BASIC METABOLIC PANEL
Anion gap: 11 (ref 5–15)
BUN: 15 mg/dL (ref 6–20)
CO2: 27 mmol/L (ref 22–32)
Calcium: 8.7 mg/dL — ABNORMAL LOW (ref 8.9–10.3)
Chloride: 98 mmol/L — ABNORMAL LOW (ref 101–111)
Creatinine, Ser: 0.51 mg/dL (ref 0.44–1.00)
GFR calc Af Amer: >60 mL/min (ref >60)
GFR calc non Af Amer: >60 mL/min (ref >60)
Glucose, Bld: 117 mg/dL — ABNORMAL HIGH (ref 65–99)
Potassium: 4.1 mmol/L (ref 3.5–5.1)
Sodium: 136 mmol/L (ref 135–145)

## 2018-02-05 MED ORDER — ORAL CARE MOUTH RINSE: 15.0000 mL | Freq: Two times a day (BID) | OROMUCOSAL | Status: DC

## 2018-02-05 MED ORDER — DEXAMETHASONE SODIUM PHOSPHATE 4 MG/ML IJ SOLN
4.0000 mg | Freq: Four times a day (QID) | INTRAMUSCULAR | Status: DC
  Administered 2018-02-05 – 2018-02-08 (×11): 4 mg via INTRAVENOUS
  Filled 2018-02-05 (×12): qty 1

## 2018-02-05 MED ORDER — LORAZEPAM 2 MG/ML IJ SOLN
INTRAMUSCULAR | Status: AC
  Filled 2018-02-05: qty 1

## 2018-02-05 MED ORDER — HALOPERIDOL LACTATE 5 MG/ML IJ SOLN
5.0000 mg | Freq: Once | INTRAMUSCULAR | Status: AC
  Administered 2018-02-05: 5 mg via INTRAMUSCULAR
  Filled 2018-02-05: qty 1

## 2018-02-05 MED ORDER — RACEPINEPHRINE HCL 2.25 % IN NEBU
0.5000 mL | INHALATION_SOLUTION | Freq: Once | RESPIRATORY_TRACT | Status: AC
  Administered 2018-02-05: 0.5 mL via RESPIRATORY_TRACT

## 2018-02-05 MED ORDER — ORAL CARE MOUTH RINSE
15.0000 mL | Freq: Four times a day (QID) | OROMUCOSAL | Status: DC
  Administered 2018-02-05 – 2018-02-06 (×4): 15 mL via OROMUCOSAL

## 2018-02-05 MED ORDER — ETOMIDATE 2 MG/ML IV SOLN: 0.3000 mg/kg | Freq: Once | INTRAVENOUS | Status: DC

## 2018-02-05 MED ORDER — DEXMEDETOMIDINE HCL IN NACL 400 MCG/100ML IV SOLN
0.0000 ug/kg/h | INTRAVENOUS | Status: DC
  Administered 2018-02-05 – 2018-02-06 (×6): 1.2 ug/kg/h via INTRAVENOUS
  Filled 2018-02-05 (×7): qty 100

## 2018-02-05 MED ORDER — RACEPINEPHRINE HCL 2.25 % IN NEBU
INHALATION_SOLUTION | RESPIRATORY_TRACT | Status: AC
  Administered 2018-02-05: 11:00:00
  Filled 2018-02-05: qty 1

## 2018-02-05 MED ORDER — LORAZEPAM 2 MG/ML IJ SOLN
1.0000 mg | Freq: Once | INTRAMUSCULAR | Status: AC
Start: 2018-02-05 — End: 2018-02-05
  Administered 2018-02-05: 1 mg via INTRAVENOUS

## 2018-02-05 MED ORDER — FENTANYL CITRATE (PF) 100 MCG/2ML IJ SOLN
INTRAMUSCULAR | Status: AC
  Administered 2018-02-05: 100 ug
  Filled 2018-02-05: qty 2

## 2018-02-05 MED ORDER — DEXAMETHASONE SODIUM PHOSPHATE 10 MG/ML IJ SOLN
4.0000 mg | Freq: Four times a day (QID) | INTRAMUSCULAR | Status: DC
  Administered 2018-02-05: 4 mg via INTRAVENOUS
  Filled 2018-02-05 (×2): qty 0.4

## 2018-02-05 MED ORDER — DEXMEDETOMIDINE HCL IN NACL 200 MCG/50ML IV SOLN
0.0000 ug/kg/h | INTRAVENOUS | Status: DC
  Administered 2018-02-05: 1.2 ug/kg/h via INTRAVENOUS
  Filled 2018-02-05: qty 50

## 2018-02-05 MED ORDER — FENTANYL CITRATE (PF) 100 MCG/2ML IJ SOLN: 12.5000 ug | INTRAMUSCULAR | Status: DC | PRN

## 2018-02-05 MED ORDER — MIDAZOLAM HCL 2 MG/2ML IJ SOLN
INTRAMUSCULAR | Status: AC
  Administered 2018-02-05: 2 mg
  Filled 2018-02-05: qty 2

## 2018-02-05 MED ORDER — SODIUM CHLORIDE 0.9 % IV SOLN
0.5000 mg/h | INTRAVENOUS | Status: DC
  Administered 2018-02-05: 4 mg/h via INTRAVENOUS
  Administered 2018-02-05: 0.5 mg/h via INTRAVENOUS
  Administered 2018-02-06 – 2018-02-07 (×2): 4 mg/h via INTRAVENOUS
  Filled 2018-02-05 (×5): qty 5

## 2018-02-05 MED ORDER — CHLORHEXIDINE GLUCONATE 0.12% ORAL RINSE (MEDLINE KIT)
15.0000 mL | Freq: Two times a day (BID) | OROMUCOSAL | Status: DC
  Administered 2018-02-05 – 2018-02-07 (×4): 15 mL via OROMUCOSAL

## 2018-02-05 MED ORDER — MIDAZOLAM HCL 2 MG/2ML IJ SOLN
2.0000 mg | INTRAMUSCULAR | Status: AC | PRN
  Administered 2018-02-05 – 2018-02-06 (×3): 2 mg via INTRAVENOUS
  Filled 2018-02-05 (×4): qty 2

## 2018-02-05 MED ORDER — ETOMIDATE 2 MG/ML IV SOLN
20.0000 mg | Freq: Once | INTRAVENOUS | Status: AC
  Administered 2018-02-05: 20 mg via INTRAVENOUS

## 2018-02-05 MED ORDER — MIDAZOLAM HCL 2 MG/2ML IJ SOLN
2.0000 mg | INTRAMUSCULAR | Status: DC | PRN
  Administered 2018-02-05 – 2018-02-06 (×6): 2 mg via INTRAVENOUS
  Filled 2018-02-05 (×6): qty 2

## 2018-02-05 MED ORDER — QUETIAPINE FUMARATE 25 MG PO TABS
200.0000 mg | ORAL_TABLET | Freq: Two times a day (BID) | ORAL | Status: DC
  Administered 2018-02-05 – 2018-02-11 (×8): 200 mg
  Filled 2018-02-05: qty 8
  Filled 2018-02-05 (×5): qty 1
  Filled 2018-02-05: qty 8
  Filled 2018-02-05: qty 1
  Filled 2018-02-05 (×2): qty 8
  Filled 2018-02-05: qty 1

## 2018-02-05 MED ORDER — CLONAZEPAM 1 MG PO TABS
1.0000 mg | ORAL_TABLET | Freq: Three times a day (TID) | ORAL | Status: DC
  Administered 2018-02-05 – 2018-02-07 (×7): 1 mg
  Filled 2018-02-05 (×7): qty 1

## 2018-02-05 MED ORDER — CHLORHEXIDINE GLUCONATE 0.12 % MT SOLN: 15.0000 mL | Freq: Two times a day (BID) | OROMUCOSAL | Status: DC

## 2018-02-05 NOTE — Progress Notes (Signed)
[facility]  Stridor, tachypnea post extubation  Plan  Decadron Racemic epi NTS suctioning Ativan  Continue close monitoring  May need re-intubation  Updated husband by phone  Roselie Awkward, MD [facility] Pager: [phone] Cell: [phone] After 3pm or if no response, call [phone]

## 2018-02-05 NOTE — Progress Notes (Signed)
[facility]  Stridor worsened, persistent hypoxemia Required re-intubation Husband updated bedside  Roselie Awkward, MD [place] [facility] Pager: [phone] Cell: [phone] After 3pm or if no response, call [phone]

## 2018-02-05 NOTE — Procedures (Signed)
Intubation Procedure Note Natalie Morrison [id number] October 22, 1970  Procedure: Intubation Indications: Airway protection and maintenance  Procedure Details Consent: Risks of procedure as well as the alternatives and risks of each were explained to the (patient/caregiver).  Consent for procedure obtained. Time Out: Verified patient identification, verified procedure, site/side was marked, verified correct patient position, special equipment/implants available, medications/allergies/relevent history reviewed, required imaging and test results available.  Performed  Drugs Etomidate 20mg  IV, Versed 2mg  IV, Fentanyl 140mcg IV DL x 1 with GS3 blade Grade 1 view: some cord edema, mild erythema noted on R cord, likely small laceration related to self extubation 7.5 ET tube passed through cords under direct visualization Placement confirmed with bilateral breath sounds, positive EtCO2 change and smoke in tube   Evaluation Hemodynamic Status: BP stable throughout; O2 sats: stable throughout Patient's Current Condition: stable Complications: No apparent complications Patient did tolerate procedure well. Chest X-ray ordered to verify placement.  CXR: pending.   Simonne Maffucci 02/05/2018

## 2018-02-05 NOTE — Progress Notes (Signed)
55ml Fentanyl wasted in sink with Janann Colonel, RN.

## 2018-02-05 NOTE — Progress Notes (Signed)
PULMONARY / CRITICAL CARE MEDICINE   Name: Natalie Morrison MRN: [id number] DOB: 01/16/1971    ADMISSION DATE:  01/27/2018 CONSULTATION DATE:  01/27/2018  REFERRING MD:  Opyd  CHIEF COMPLAINT:  dyspnea  BRIEF SUMMARY:   47 y/o female with asthma, EtOH abuse admitted CAP, developed ARDS.  Self extubated 5/2 early am requiring reintubation.   SUBJECTIVE:   Minimal agitation this morning but had some overnight Weaning on 5/5 this morning   VITAL SIGNS: BP 91/66 (BP Location: Right Arm)   Pulse 75   Temp 98.9 F (37.2 C) (Core)   Resp 18   Ht _0  (1.676 m)   Wt 144 lb 2.9 oz (65.4 kg)   SpO2 95%   BMI 23.27 kg/m   HEMODYNAMICS:    VENTILATOR SETTINGS: Vent Mode: PCV FiO2 (%):  [40 %] 40 % Set Rate:  [20 bmp] 20 bmp PEEP:  [5 cmH20] 5 cmH20 Pressure Support:  [5 cmH20-10 cmH20] 10 cmH20 Plateau Pressure:  [20 KTG25-63 cmH20] 20 cmH20  INTAKE / OUTPUT: I/O last 3 completed shifts: In: 4153.6 [I.V.:1933.6; NG/GT:2120; IV Piggyback:100] Out: 8937 [Urine:5940]  PHYSICAL EXAMINATION:  General:  In bed on vent HENT: NCAT ETT in place PULM: CTA B, vent supported breathing CV: RRR, no mgr GI: BS+, soft, nontender MSK: normal bulk and tone Neuro: sedated on vent     LABS:  BMET Recent Labs  Lab 02/02/18 0333 02/03/18 0500 02/04/18 0340  NA 141 139 138  K 3.2* 3.8 4.2  CL 96* 97* 102  CO2 34* 32 29  BUN _1 CREATININE 0.40* 0.44 0.42*  GLUCOSE 105* 157* 123*    Electrolytes Recent Labs  Lab 01/31/18 0500 02/01/18 0834 02/02/18 0333 02/03/18 0500 02/04/18 0340  CALCIUM 8.9 8.7* 8.8* 8.6* 8.6*  MG 1.5* 1.4* 1.9  --   --   PHOS 3.6 4.8* 4.8*  --   --     CBC Recent Labs  Lab 02/02/18 0333 02/03/18 0500 02/04/18 0340  WBC 15.4* 19.8* 14.5*  HGB 9.7* 9.9* 8.9*  HCT 30.3* 30.3* 28.1*  PLT 329 389 394    Coag's No results for input(s): APTT, INR in the last 168 hours.  Sepsis Markers No results for input(s): LATICACIDVEN,  PROCALCITON, O2SATVEN in the last 168 hours.  ABG Recent Labs  Lab 01/30/18 0448 01/31/18 0436  PHART 7.396 7.393  PCO2ART 46.0 50.0*  PO2ART 91.0 89.1    Liver Enzymes No results for input(s): AST, ALT, ALKPHOS, BILITOT, ALBUMIN in the last 168 hours.  Cardiac Enzymes No results for input(s): TROPONINI, PROBNP in the last 168 hours.  Glucose Recent Labs  Lab 02/04/18 1601 02/04/18 2007 02/04/18 2346 02/05/18 0332 02/05/18 0755 02/05/18 0845  GLUCAP 118* 129* 123* 113* 116* 112*    Imaging Dg Chest Port 1 View  Result Date: 02/05/2018 CLINICAL DATA:  47 year old female with history of acute respiratory failure. EXAM: PORTABLE CHEST 1 VIEW COMPARISON:  Chest x-ray 02/03/2018. FINDINGS: An endotracheal tube is in place with tip 2.9 cm above the carina. Nasogastric tube extends into the stomach. Lung volumes are low. Diffuse patchy interstitial and airspace disease scattered throughout the lungs bilaterally, with persistent similar poor aeration when compared to yesterday's examination. Probable small left pleural effusion. Pulmonary vasculature is obscured. Heart size appears grossly normal. Mediastinal contours are obscured. IMPRESSION: 1. Support apparatus, as above. 2. The appearance of the lungs is most suggestive of severe multilobar pneumonia, similar to the recent prior examination. Electronically  Signed   By: Vinnie Langton M.D.   On: 02/05/2018 08:41    LINES / TUBES: ETT/OG 4/25 >> 5/2, 5/2 >>   CULTURES: Sputum 4/25 >> normal flora  BCx2 4/25 >> negative  BAL 4/29 >> negative  AFB/Fungal 4/29 >>   ANTIBIOTICS: Vancomycin 4/25> 4/26 Doxycycline 4/25 >> 4/30 Rocephin 4/25 >> 4/30  SIGNIFICANT EVENTS:  4/25 Intubated and admitted to the ICU  4/29  Bronchoscopy with BAL 5/02  Self extubated, reintubated   DISCUSSION: 47 y/o female with severe CAP causing ARDS.  Has a history of EtOH abuse.   ASSESSMENT / PLAN:  PULMONARY A: ARDS -  improving Severe CAP Asthma P:   Diurese again today Extubate today> monitor closely in ICU setting  CARDIOVASCULAR A:  Sedation Related Hypotension  P:  Tele Monitor QTc Wean off sedation Wean off levophed for MAP > 65  RENAL A:   Hypokalemia P:   Monitor BMET and UOP Replace electrolytes as needed  GASTROINTESTINAL A:   Constipation > improved P:   Continue free water pepcid for stress ulcer prophylaxis Continue bowel regimen  HEMATOLOGIC A:   Anemia without bleeding P:  Monitor for bleeding lovenox for DVT prophylaxis  INFECTIOUS A:   Severe CAP - treated  Low grade fever> due to ARDS, WBC down, clinically improved P:   Monitor for hemodynamic change, WBC change Monitor off of antibiotics  ENDOCRINE A:   Hyperglycemia    P:   Monitor glucose, SSI  NEUROLOGIC A:   Anxiety, agitation requiring heavy sedation for vent synchrony Hx Depression, Anxiety, ETOH Abuse P:   Continue precedex after extubation, wean to off RASS goal 0 Decrease clonazepam to 2m po q8h Continue seroquel Stop fentanyl infusion   FAMILY  - Updates:  Updated husband bedside 5/3  My cc time 30 minutes  BRoselie Awkward MD [facility] Pager: [phone]: [phone] 3pm or if no response, call [phone]

## 2018-02-05 NOTE — Procedures (Signed)
Extubation Procedure Note  Patient Details:   Name: Natalie Morrison DOB: 03/22/1971 MRN: [id number]   Airway Documentation:    Vent end date: 02/05/18 Vent end time: 0942   Evaluation  O2 sats: stable throughout Complications: No apparent complications Patient did tolerate procedure well. Bilateral Breath Sounds: Rhonchi   Yes  6l  placed  positive cuff leak   [name] 02/05/2018, 9:43 AM

## 2018-02-06 ENCOUNTER — Inpatient Hospital Stay (HOSPITAL_COMMUNITY): Payer: BLUE CROSS/BLUE SHIELD

## 2018-02-06 LAB — GLUCOSE, CAPILLARY
Glucose-Capillary: 151 mg/dL — ABNORMAL HIGH (ref 65–99)
Glucose-Capillary: 152 mg/dL — ABNORMAL HIGH (ref 65–99)
Glucose-Capillary: 153 mg/dL — ABNORMAL HIGH (ref 65–99)
Glucose-Capillary: 163 mg/dL — ABNORMAL HIGH (ref 65–99)
Glucose-Capillary: 168 mg/dL — ABNORMAL HIGH (ref 65–99)
Glucose-Capillary: 186 mg/dL — ABNORMAL HIGH (ref 65–99)
Glucose-Capillary: 193 mg/dL — ABNORMAL HIGH (ref 65–99)

## 2018-02-06 LAB — BASIC METABOLIC PANEL
Anion gap: 10 (ref 5–15)
BUN: 20 mg/dL (ref 6–20)
CO2: 24 mmol/L (ref 22–32)
Calcium: 8.9 mg/dL (ref 8.9–10.3)
Chloride: 102 mmol/L (ref 101–111)
Creatinine, Ser: 0.45 mg/dL (ref 0.44–1.00)
GFR calc Af Amer: >60 mL/min (ref >60)
GFR calc non Af Amer: >60 mL/min (ref >60)
Glucose, Bld: 164 mg/dL — ABNORMAL HIGH (ref 65–99)
Potassium: 4.1 mmol/L (ref 3.5–5.1)
Sodium: 136 mmol/L (ref 135–145)

## 2018-02-06 LAB — CBC
HCT: 29.9 % — ABNORMAL LOW (ref 36.0–46.0)
Hemoglobin: 9.8 g/dL — ABNORMAL LOW (ref 12.0–15.0)
MCH: 34.4 pg — ABNORMAL HIGH (ref 26.0–34.0)
MCHC: 32.8 g/dL (ref 30.0–36.0)
MCV: 104.9 fL — ABNORMAL HIGH (ref 78.0–100.0)
Platelets: 538 10*3/uL — ABNORMAL HIGH (ref 150–400)
RBC: 2.85 MIL/uL — ABNORMAL LOW (ref 3.87–5.11)
RDW: 12.9 % (ref 11.5–15.5)
WBC: 16.8 10*3/uL — ABNORMAL HIGH (ref 4.0–10.5)

## 2018-02-06 MED ORDER — ORAL CARE MOUTH RINSE: 15.0000 mL | OROMUCOSAL | Status: DC

## 2018-02-06 MED ORDER — DEXMEDETOMIDINE HCL IN NACL 200 MCG/50ML IV SOLN
0.4000 ug/kg/h | INTRAVENOUS | Status: DC
  Administered 2018-02-06: 1.4 ug/kg/h via INTRAVENOUS
  Filled 2018-02-06: qty 50

## 2018-02-06 MED ORDER — POTASSIUM CHLORIDE 20 MEQ/15ML (10%) PO SOLN
40.0000 meq | Freq: Two times a day (BID) | ORAL | Status: AC
  Administered 2018-02-06 – 2018-02-07 (×3): 40 meq
  Filled 2018-02-06 (×3): qty 30

## 2018-02-06 MED ORDER — ORAL CARE MOUTH RINSE
15.0000 mL | OROMUCOSAL | Status: DC
  Administered 2018-02-06 – 2018-02-07 (×14): 15 mL via OROMUCOSAL

## 2018-02-06 NOTE — Progress Notes (Addendum)
I spoke with [facility] in regards to patient sitting up in bed & trying to extubate herself.Elzie Rings stated she is going to speak with the physician to see what can be done. I will continue to monitor patient closely & follow physician's orders.

## 2018-02-06 NOTE — Progress Notes (Signed)
Dr. Oletta Darter notified about patient being very anxious and trying to self extubate. He wants an EKG before increasing any sedation. Will obtain EKG at this time.

## 2018-02-06 NOTE — Progress Notes (Signed)
PULMONARY / CRITICAL CARE MEDICINE   Name: Natalie Morrison MRN: [id number] DOB: Feb 07, 1971    ADMISSION DATE:  01/27/2018 CONSULTATION DATE:  01/27/2018  REFERRING MD:  Opyd  CHIEF COMPLAINT:  dyspnea  BRIEF SUMMARY:   47 y/o female with asthma, EtOH abuse admitted CAP, developed ARDS.  Self extubated 5/2 early am requiring reintubation.   SUBJECTIVE:   Some agitation yesterday Haldol helped Extubated, stridor, re-intubated   VITAL SIGNS: BP (!) 98/54   Pulse 67   Temp (!) 97.5 F (36.4 C) (Oral)   Resp 16   Ht _0  (1.676 m)   Wt 146 lb 6.2 oz (66.4 kg)   SpO2 99%   BMI 23.63 kg/m   HEMODYNAMICS:    VENTILATOR SETTINGS: Vent Mode: PCV FiO2 (%):  [40 %] 40 % Set Rate:  [20 bmp] 20 bmp PEEP:  [5 cmH20] 5 cmH20 Plateau Pressure:  [19 cmH20-22 cmH20] 19 cmH20  INTAKE / OUTPUT: I/O last 3 completed shifts: In: 3434.2 [I.V.:1485.2; NG/GT:1749; IV Piggyback:200] Out: 5560 [Urine:5560]  PHYSICAL EXAMINATION:  General:  In bed on vent HENT: NCAT ETT in place PULM: CTA B, vent supported breathing CV: RRR, no mgr GI: BS+, soft, nontender MSK: normal bulk and tone Neuro: sedated on vent       LABS:  BMET Recent Labs  Lab 02/03/18 0500 02/04/18 0340 02/05/18 0926  NA 139 138 136  K 3.8 4.2 4.1  CL 97* 102 98*  CO2 32 29 27  BUN _1 CREATININE 0.44 0.42* 0.51  GLUCOSE 157* 123* 117*    Electrolytes Recent Labs  Lab 01/31/18 0500 02/01/18 0834 02/02/18 0333 02/03/18 0500 02/04/18 0340 02/05/18 0926  CALCIUM 8.9 8.7* 8.8* 8.6* 8.6* 8.7*  MG 1.5* 1.4* 1.9  --   --   --   PHOS 3.6 4.8* 4.8*  --   --   --     CBC Recent Labs  Lab 02/03/18 0500 02/04/18 0340 02/06/18 0552  WBC 19.8* 14.5* 16.8*  HGB 9.9* 8.9* 9.8*  HCT 30.3* 28.1* 29.9*  PLT 389 394 538*    Coag's No results for input(s): APTT, INR in the last 168 hours.  Sepsis Markers No results for input(s): LATICACIDVEN, PROCALCITON, O2SATVEN in the last 168  hours.  ABG Recent Labs  Lab 01/31/18 0436  PHART 7.393  PCO2ART 50.0*  PO2ART 89.1    Liver Enzymes No results for input(s): AST, ALT, ALKPHOS, BILITOT, ALBUMIN in the last 168 hours.  Cardiac Enzymes No results for input(s): TROPONINI, PROBNP in the last 168 hours.  Glucose Recent Labs  Lab 02/05/18 1151 02/05/18 1646 02/05/18 1959 02/06/18 0008 02/06/18 0344 02/06/18 0814  GLUCAP 118* 167* 154* 186* 163* 168*    Imaging Dg Chest Port 1 View  Result Date: 02/06/2018 CLINICAL DATA:  Acute respiratory failure with hypoxemia EXAM: PORTABLE CHEST 1 VIEW COMPARISON:  Chest radiograph from one day prior. FINDINGS: Endotracheal tube tip is 3.8 cm above the carina. Enteric tube enters stomach with the tip not seen on this image. Left PICC terminates over the right atrium. Stable cardiomediastinal silhouette with normal heart size. No pneumothorax. No pleural effusion. Stable severe patchy opacities throughout both lungs. IMPRESSION: 1. Well-positioned support structures. 2. Stable severe diffuse patchy lung opacities suggesting multilobar pneumonia and/or ARDS. Electronically Signed   By: Ilona Sorrel M.D.   On: 02/06/2018 08:25   Portable Chest X-ray  Result Date: 02/05/2018 CLINICAL DATA:  Intubated EXAM: PORTABLE CHEST 1 VIEW COMPARISON:  Chest radiograph from earlier today. FINDINGS: Endotracheal tube tip is 3.3 cm above the carina. Enteric tube terminates in the distal stomach. Left PICC terminates at the cavoatrial junction. Stable cardiomediastinal silhouette with normal heart size. No pneumothorax. No pleural effusion. Stable severe patchy opacities throughout both lungs. IMPRESSION: 1. Well-positioned support structures as detailed. Enteric tube terminates in the distal stomach. 2. Stable severe patchy opacities throughout both lungs most suggestive of multilobar pneumonia. Electronically Signed   By: Ilona Sorrel M.D.   On: 02/05/2018 12:19    LINES / TUBES: ETT/OG 4/25 >>  5/2, 5/2 >> 5/4, 5/4 >   CULTURES: Sputum 4/25 >> normal flora  BCx2 4/25 >> negative  BAL 4/29 >> negative  AFB/Fungal 4/29 >>   ANTIBIOTICS: Vancomycin 4/25> 4/26 Doxycycline 4/25 >> 4/30 Rocephin 4/25 >> 4/30  SIGNIFICANT EVENTS:  4/25 Intubated and admitted to the ICU  4/29  Bronchoscopy with BAL 5/02  Self extubated, reintubated  5/4 extubated, stridor, re-intubated  DISCUSSION: 47 y/o female with severe CAP causing ARDS.  Has a history of EtOH abuse.   ASSESSMENT / PLAN:  PULMONARY A: ARDS - improving, 5/5 CXR with bilateral air space disease improved Severe CAP Asthma Stridor> some laryngeal edema 5/4 on re-intubation P:   Full mechanical vent support VAP prevention Daily WUA/SBT Continue decadron Diurese again today  CARDIOVASCULAR A:  Sedation Related Hypotension  > resolved when not on sedation 5/4 P:  Tele QTc monitoring Wean off sedation as able   RENAL A:   No acute issues P:   Monitor BMET and UOP Replace electrolytes as needed   GASTROINTESTINAL A:   Constipation > improved P:   Continue tube feeding Continue medical stress ulcer prophylaxis Would use methylnaltrexone if recurs Continue bowel regimen  HEMATOLOGIC A:   Anemia without bleeding P:  Monitor for bleeding Transfuse PRBC for Hgb < 7 gm/dL Lovenox for DVT prophylaxis  INFECTIOUS A:   Severe CAP - treated  Low grade fever> due to ARDS, WBC down, clinically improved P:   Monitor for hemodynamic change Monitor off of antibiotics  ENDOCRINE A:   Hyperglycemia    P:   Monitor glucose  NEUROLOGIC A:   Severe anxiety, agitation requiring heavy sedation for vent synchrony  Hx Depression, Anxiety, ETOH Abuse Likely tachyphylaxis P:   Increased seroquel 5/4 Monitor QTc closely> EKG now Continue dilaudid, precedex, clonazepam RASS goal -2 per PD protocol  FAMILY  - Updates:  Updated husband bedside 5/4  My cc time 33 minutes  Roselie Awkward,  MD [place] [facility] Pager: [phone] Cell: [phone] After 3pm or if no response, call [phone]

## 2018-02-06 NOTE — Progress Notes (Signed)
RT note-No wean today per Dr. Lake Bells.

## 2018-02-06 NOTE — Progress Notes (Addendum)
[place] Progress Note Patient Name: Natalie Morrison DOB: [birth year] MRN: [id number]   Date of Service  02/06/2018  HPI/Events of Note  Agitation - Presently on a Precedex IV infusion at ceiling of 1.2 mcg/kg/hour. QTc interval = 477 --> 459 milliseconds.   eICU Interventions  Will order: 1. Precedex 0.4-1.7 mcg/kg/hour. Titrate to RASS = 0 to -1.  2. Monitor QTc interval Q 6 hours. Notify MD if QTc interval > 500 milliseconds.         Natalie Morrison 02/06/2018, 11:06 PM

## 2018-02-07 LAB — BASIC METABOLIC PANEL
Anion gap: 9 (ref 5–15)
BUN: 21 mg/dL — ABNORMAL HIGH (ref 6–20)
CO2: 23 mmol/L (ref 22–32)
Calcium: 9 mg/dL (ref 8.9–10.3)
Chloride: 105 mmol/L (ref 101–111)
Creatinine, Ser: 0.49 mg/dL (ref 0.44–1.00)
GFR calc Af Amer: >60 mL/min (ref >60)
GFR calc non Af Amer: >60 mL/min (ref >60)
Glucose, Bld: 177 mg/dL — ABNORMAL HIGH (ref 65–99)
Potassium: 5.2 mmol/L — ABNORMAL HIGH (ref 3.5–5.1)
Sodium: 137 mmol/L (ref 135–145)

## 2018-02-07 LAB — GLUCOSE, CAPILLARY
Glucose-Capillary: 153 mg/dL — ABNORMAL HIGH (ref 65–99)
Glucose-Capillary: 170 mg/dL — ABNORMAL HIGH (ref 65–99)
Glucose-Capillary: 209 mg/dL — ABNORMAL HIGH (ref 65–99)
Glucose-Capillary: 133 mg/dL — ABNORMAL HIGH (ref 65–99)
Glucose-Capillary: 134 mg/dL — ABNORMAL HIGH (ref 65–99)
Glucose-Capillary: 119 mg/dL — ABNORMAL HIGH (ref 65–99)

## 2018-02-07 LAB — MAGNESIUM: Magnesium: 2.2 mg/dL (ref 1.7–2.4)

## 2018-02-07 MED ORDER — ORAL CARE MOUTH RINSE
15.0000 mL | Freq: Two times a day (BID) | OROMUCOSAL | Status: DC
  Administered 2018-02-09 – 2018-02-14 (×10): 15 mL via OROMUCOSAL

## 2018-02-07 MED ORDER — HALOPERIDOL LACTATE 5 MG/ML IJ SOLN
2.0000 mg | Freq: Once | INTRAMUSCULAR | Status: AC
  Administered 2018-02-07: 2 mg via INTRAVENOUS
  Filled 2018-02-07: qty 1

## 2018-02-07 MED ORDER — CHLORHEXIDINE GLUCONATE 0.12 % MT SOLN
15.0000 mL | Freq: Two times a day (BID) | OROMUCOSAL | Status: DC
  Administered 2018-02-08 – 2018-02-16 (×15): 15 mL via OROMUCOSAL
  Filled 2018-02-07 (×14): qty 15

## 2018-02-07 MED ORDER — FUROSEMIDE 10 MG/ML IJ SOLN
40.0000 mg | Freq: Three times a day (TID) | INTRAMUSCULAR | Status: AC
  Administered 2018-02-07 (×2): 40 mg via INTRAVENOUS
  Filled 2018-02-07 (×3): qty 4

## 2018-02-07 MED ORDER — HALOPERIDOL LACTATE 5 MG/ML IJ SOLN
2.0000 mg | INTRAMUSCULAR | Status: DC | PRN
  Administered 2018-02-07 – 2018-02-08 (×2): 4 mg via INTRAVENOUS
  Filled 2018-02-07 (×2): qty 1

## 2018-02-07 MED ORDER — DEXMEDETOMIDINE HCL IN NACL 400 MCG/100ML IV SOLN
0.4000 ug/kg/h | INTRAVENOUS | Status: DC
  Administered 2018-02-07 – 2018-02-08 (×10): 1.7 ug/kg/h via INTRAVENOUS
  Filled 2018-02-07 (×9): qty 100

## 2018-02-07 MED ORDER — MIDAZOLAM HCL 2 MG/2ML IJ SOLN
2.0000 mg | INTRAMUSCULAR | Status: DC | PRN
  Administered 2018-02-07 (×2): 2 mg via INTRAVENOUS
  Filled 2018-02-07 (×2): qty 2

## 2018-02-07 NOTE — Progress Notes (Signed)
Dr. Oletta Darter increased Versed order to 2MG  IV Q 1 Hour.

## 2018-02-07 NOTE — Progress Notes (Signed)
[place] Progress Note Patient Name: Natalie Morrison DOB: 25-Apr-1971 MRN: [id number]   Date of Service  02/07/2018  HPI/Events of Note  Delirium - Request to renew order for 5 point restraints.   eICU Interventions  Will renew order for 5 point restraints.      Intervention Category Major Interventions: Delirium, psychosis, severe agitation - evaluation and management  Sommer,Steven Eugene 02/07/2018, 9:33 PM

## 2018-02-07 NOTE — Procedures (Signed)
Extubation Procedure Note  Patient Details:   Name: Natalie Morrison DOB: January 04, 1971 MRN: [id number]   Airway Documentation:  Airway (Active)  Secured at (cm) 22 cm 02/07/2018  8:17 AM  Measured From Lips 02/07/2018  8:17 AM  Secured Location Left 02/07/2018  8:17 AM  Secured By [employer] 02/07/2018  8:17 AM  Tube Holder Repositioned Yes 02/07/2018  8:17 AM  Cuff Pressure (cm H2O) 28 cm H2O 02/07/2018  8:17 AM  Site Condition Dry 02/07/2018  8:17 AM   Vent end date: 02/05/18 Vent end time: 0942   Evaluation  O2 sats: stable throughout Complications: No apparent complications Patient did tolerate procedure well. Bilateral Breath Sounds: Clear, Diminished   Yes   Patient extubated per order to 4L [place] with no apparent complications. Positive cuff leak was noted prior to extubation. Vitals are stable and sats are 98%. Patient is able to speak weakly. RT will continue to monitor.   [name] Clyda Greener 02/07/2018, 10:47 AM

## 2018-02-07 NOTE — Progress Notes (Signed)
[place] Progress Note Patient Name: Natalie Morrison DOB: 05/06/1971 MRN: [id number]   Date of Service  02/07/2018  HPI/Events of Note  Agitation - QTc = 0.456 seconds.   eICU Interventions  Will order: 1. Haldol 2 mg IV X 1.         Natalie Morrison 02/07/2018, 2:29 AM

## 2018-02-07 NOTE — Progress Notes (Signed)
[place] Progress Note Patient Name: JAALIYAH LUCATERO DOB: 04-04-1971 MRN: [id number]   Date of Service  02/07/2018  HPI/Events of Note  Remains agitated - Currently on Dilaudid and Precedex IV infusions.   eICU Interventions  Will order: 1. Increase Versed to 2 mg IV Q 1 hour PRN.      Intervention Category Minor Interventions: Agitation / anxiety - evaluation and management  Sommer,Steven Eugene 02/07/2018, 12:36 AM

## 2018-02-07 NOTE — Progress Notes (Signed)
Patient has been restless throughout the entire night until now. Patient is resting with eyes closed now for the first time tonight. Will continue to monitor.

## 2018-02-07 NOTE — Progress Notes (Signed)
PULMONARY / CRITICAL CARE MEDICINE   Name: SELENNE COGGIN MRN: [id number] DOB: [birth year]    ADMISSION DATE:  01/27/2018 CONSULTATION DATE:  01/27/2018  REFERRING MD:  Opyd  CHIEF COMPLAINT:  dyspnea  BRIEF SUMMARY:   47 y/o female with asthma, EtOH abuse admitted CAP, developed ARDS.  Self extubated 5/2 early am requiring reintubation.   SUBJECTIVE:   5/4 extubated, stridor, reintubated Significant agitation overnight, precedex and versed PRN ordered.  VITAL SIGNS: BP (!) 139/96   Pulse 64   Temp (!) 97 F (36.1 C) (Core)   Resp 20   Ht _0  (1.676 m)   Wt 145 lb 15.1 oz (66.2 kg)   SpO2 99%   BMI 23.56 kg/m   HEMODYNAMICS:    VENTILATOR SETTINGS: Vent Mode: PCV FiO2 (%):  [40 %] 40 % Set Rate:  [20 bmp] 20 bmp Vt Set:  [480 mL] 480 mL PEEP:  [5 cmH20] 5 cmH20 Plateau Pressure:  [16 cmH20-19 cmH20] 16 cmH20  INTAKE / OUTPUT: I/O last 3 completed shifts: In: 3574.7 [I.V.:1549.7; NG/GT:1875; IV Piggyback:150] Out: 5916 [Urine:2505; Emesis/NG output:350]  PHYSICAL EXAMINATION:  General:  Chronically ill appearing female, older than stated age HENT: [place]/AT, PERRL, EOM-I and MMM PULM: CTA bilaterally CV: RRR, Nl S1/S2 and -M/R/G GI: Soft, NT, ND and +BS MSK: Normal bulk and tone Neuro: Sedated on vent but arousable and moving all ext to command  LABS:  BMET Recent Labs  Lab 02/04/18 0340 02/05/18 0926 02/06/18 0919  NA 138 136 136  K 4.2 4.1 4.1  CL 102 98* 102  CO2 _1 BUN _2 CREATININE 0.42* 0.51 0.45  GLUCOSE 123* 117* 164*   Electrolytes Recent Labs  Lab 02/01/18 0834 02/02/18 0333  02/04/18 0340 02/05/18 0926 02/06/18 0919  CALCIUM 8.7* 8.8*   < > 8.6* 8.7* 8.9  MG 1.4* 1.9  --   --   --   --   PHOS 4.8* 4.8*  --   --   --   --    < > = values in this interval not displayed.   CBC Recent Labs  Lab 02/03/18 0500 02/04/18 0340 02/06/18 0552  WBC 19.8* 14.5* 16.8*  HGB 9.9* 8.9* 9.8*  HCT 30.3* 28.1* 29.9*  PLT  389 394 538*   Coag's No results for input(s): APTT, INR in the last 168 hours.  Sepsis Markers No results for input(s): LATICACIDVEN, PROCALCITON, O2SATVEN in the last 168 hours.  ABG No results for input(s): PHART, PCO2ART, PO2ART in the last 168 hours.  Liver Enzymes No results for input(s): AST, ALT, ALKPHOS, BILITOT, ALBUMIN in the last 168 hours.  Cardiac Enzymes No results for input(s): TROPONINI, PROBNP in the last 168 hours.  Glucose Recent Labs  Lab 02/06/18 1233 02/06/18 1516 02/06/18 1941 02/06/18 2327 02/07/18 0350 02/07/18 0733  GLUCAP 151* 152* 193* 153* 153* 170*   Imaging No results found.  LINES / TUBES: ETT/OG 4/25 >> 5/2, 5/2 >> 5/4, 5/4 >   CULTURES: Sputum 4/25 >> normal flora  BCx2 4/25 >> negative  BAL 4/29 >> negative  AFB/Fungal 4/29 >>   ANTIBIOTICS: Vancomycin 4/25> 4/26 Doxycycline 4/25 >> 4/30 Rocephin 4/25 >> 4/30  SIGNIFICANT EVENTS:  4/25 Intubated and admitted to the ICU  4/29  Bronchoscopy with BAL 5/02  Self extubated, reintubated  5/4 extubated, stridor, re-intubated  DISCUSSION: 47 y/o female with severe CAP causing ARDS.  Has a history of EtOH abuse.   ASSESSMENT /  PLAN:  PULMONARY A: ARDS - improving, 5/5 CXR with bilateral air space disease improved Severe CAP Asthma Stridor> some laryngeal edema 5/4 on re-intubation P:   PS trials today, if successful then will extubate, if fails then will reintubate and trach VAP prevention Daily WUA/SBT Continue decadron Lasix 40 mg IV q8 x2 doses  CARDIOVASCULAR A:  Sedation Related Hypotension  > resolved when not on sedation 5/4 P:  Tele QTc monitoring with seroquel  RENAL A:   No acute issues P:   Monitor BMET and UOP Replace electrolytes as needed Lasix 40 mg IV q8 x2 doses  GASTROINTESTINAL A:   Constipation > improved P:   Continue tube feeding Continue medical stress ulcer prophylaxis Would use methylnaltrexone if recurs Continue bowel  regimen  HEMATOLOGIC A:   Anemia without bleeding P:  Monitor for bleeding Transfuse PRBC for Hgb < 7 gm/dL Lovenox for DVT prophylaxis  INFECTIOUS A:   Severe CAP - treated  Low grade fever> due to ARDS, WBC down, clinically improved P:   Monitor for hemodynamic change Monitor off of antibiotics  ENDOCRINE A:   Hyperglycemia    P:   Monitor glucose  NEUROLOGIC A:   Severe anxiety, agitation requiring heavy sedation for vent synchrony  Hx Depression, Anxiety, ETOH Abuse Likely tachyphylaxis P:   Increased seroquel 5/4 Monitor QTc closely> EKG now Continue dilaudid, precedex, clonazepam RASS goal -2 per PD protocol  FAMILY  - Updates:  No family bedside 5/6, will attempt to hold dilaudid for now and begin weaning for a chance for extubation, if have reintubate then will trach.  If continues to be agitated towards the middle of the week then will trach.  The patient is critically ill with multiple organ systems failure and requires high complexity decision making for assessment and support, frequent evaluation and titration of therapies, application of advanced monitoring technologies and extensive interpretation of multiple databases.   Critical Care Time devoted to patient care services described in this note is  32  Minutes. This time reflects time of care of this signee Dr Jennet Maduro. This critical care time does not reflect procedure time, or teaching time or supervisory time of PA/NP/Med student/Med Resident etc but could involve care discussion time.  Rush Farmer, M.D. [facility] Pulmonary/Critical Care Medicine. Pager: [phone]. After hours pager: [phone].

## 2018-02-08 ENCOUNTER — Inpatient Hospital Stay (HOSPITAL_COMMUNITY): Payer: BLUE CROSS/BLUE SHIELD

## 2018-02-08 DIAGNOSIS — I10 Essential (primary) hypertension: Secondary | ICD-10-CM

## 2018-02-08 DIAGNOSIS — F10231 Alcohol dependence with withdrawal delirium: Secondary | ICD-10-CM

## 2018-02-08 LAB — BASIC METABOLIC PANEL
Anion gap: 10 (ref 5–15)
BUN: 24 mg/dL — ABNORMAL HIGH (ref 6–20)
CO2: 24 mmol/L (ref 22–32)
Calcium: 9 mg/dL (ref 8.9–10.3)
Chloride: 104 mmol/L (ref 101–111)
Creatinine, Ser: 0.56 mg/dL (ref 0.44–1.00)
GFR calc Af Amer: >60 mL/min (ref >60)
GFR calc non Af Amer: >60 mL/min (ref >60)
Glucose, Bld: 118 mg/dL — ABNORMAL HIGH (ref 65–99)
Potassium: 3.8 mmol/L (ref 3.5–5.1)
Sodium: 138 mmol/L (ref 135–145)

## 2018-02-08 LAB — MAGNESIUM: Magnesium: 2 mg/dL (ref 1.7–2.4)

## 2018-02-08 LAB — CBC
HCT: 31.4 % — ABNORMAL LOW (ref 36.0–46.0)
Hemoglobin: 10.2 g/dL — ABNORMAL LOW (ref 12.0–15.0)
MCH: 34.2 pg — ABNORMAL HIGH (ref 26.0–34.0)
MCHC: 32.5 g/dL (ref 30.0–36.0)
MCV: 105.4 fL — ABNORMAL HIGH (ref 78.0–100.0)
Platelets: 763 10*3/uL — ABNORMAL HIGH (ref 150–400)
RBC: 2.98 MIL/uL — ABNORMAL LOW (ref 3.87–5.11)
RDW: 12.9 % (ref 11.5–15.5)
WBC: 20.8 10*3/uL — ABNORMAL HIGH (ref 4.0–10.5)

## 2018-02-08 LAB — GLUCOSE, CAPILLARY
Glucose-Capillary: 102 mg/dL — ABNORMAL HIGH (ref 65–99)
Glucose-Capillary: 122 mg/dL — ABNORMAL HIGH (ref 65–99)
Glucose-Capillary: 127 mg/dL — ABNORMAL HIGH (ref 65–99)
Glucose-Capillary: 91 mg/dL (ref 65–99)
Glucose-Capillary: 95 mg/dL (ref 65–99)
Glucose-Capillary: 98 mg/dL (ref 65–99)

## 2018-02-08 LAB — BLOOD GAS, ARTERIAL
Acid-Base Excess: 0.4 mmol/L (ref 0.0–2.0)
Bicarbonate: 22.5 mmol/L (ref 20.0–28.0)
Drawn by: 31101
FIO2: 32
O2 Saturation: 99.6 %
Patient temperature: 98.2
pCO2 arterial: 24.9 mmHg — ABNORMAL LOW (ref 32.0–48.0)
pH, Arterial: 7.564 — ABNORMAL HIGH (ref 7.350–7.450)
pO2, Arterial: 170 mmHg — ABNORMAL HIGH (ref 83.0–108.0)

## 2018-02-08 LAB — PHOSPHORUS: Phosphorus: 4.7 mg/dL — ABNORMAL HIGH (ref 2.5–4.6)

## 2018-02-08 MED ORDER — DEXAMETHASONE SODIUM PHOSPHATE 4 MG/ML IJ SOLN
1.0000 mg | Freq: Once | INTRAMUSCULAR | Status: AC
  Administered 2018-02-08: 1 mg via INTRAVENOUS
  Filled 2018-02-08: qty 0.25

## 2018-02-08 MED ORDER — LORAZEPAM 2 MG/ML IJ SOLN
2.0000 mg | INTRAMUSCULAR | Status: DC | PRN
  Administered 2018-02-08 – 2018-02-09 (×3): 2 mg via INTRAVENOUS
  Filled 2018-02-08 (×3): qty 1

## 2018-02-08 MED ORDER — DEXAMETHASONE 0.5 MG PO TABS
1.0000 mg | ORAL_TABLET | Freq: Two times a day (BID) | ORAL | Status: DC
  Administered 2018-02-09 – 2018-02-10 (×2): 1 mg via ORAL
  Filled 2018-02-08 (×4): qty 2

## 2018-02-08 MED ORDER — THIAMINE HCL 100 MG/ML IJ SOLN
100.0000 mg | Freq: Every day | INTRAMUSCULAR | Status: DC
  Administered 2018-02-08 – 2018-02-11 (×4): 100 mg via INTRAVENOUS
  Filled 2018-02-08 (×4): qty 2

## 2018-02-08 MED ORDER — CLONAZEPAM 0.25 MG PO TBDP
1.0000 mg | ORAL_TABLET | Freq: Two times a day (BID) | ORAL | Status: DC
  Administered 2018-02-08 – 2018-02-11 (×5): 1 mg via ORAL
  Filled 2018-02-08: qty 4
  Filled 2018-02-08: qty 2
  Filled 2018-02-08 (×3): qty 4
  Filled 2018-02-08: qty 2

## 2018-02-08 MED ORDER — FOLIC ACID 5 MG/ML IJ SOLN
1.0000 mg | Freq: Every day | INTRAMUSCULAR | Status: DC
  Administered 2018-02-09 – 2018-02-10 (×3): 1 mg via INTRAVENOUS
  Filled 2018-02-08 (×3): qty 0.2

## 2018-02-08 NOTE — Care Management Note (Signed)
Case Management Note  Patient Details  Name: Natalie Morrison MRN: [id number] Date of Birth: 10/28/1970  Subjective/Objective:     Pt admitted with severe CAP - failed outpt treatment            Action/Plan:   PTA independent from home.  Pt remains ventilated.     Expected Discharge Date:                  Expected Discharge Plan:  Home/Self Care  In-House Referral:     Discharge planning Services  CM Consult  Post Acute Care Choice:    Choice offered to:     DME Arranged:    DME Agency:     HH Arranged:    HH Agency:     Status of Service:     If discussed at [employer] of Stay Meetings, dates discussed:    Additional Comments: 02/08/2018  Pt is now extubated but still requiring Precedex drip and safety sitter.  Pt is on CIWA protocol - CSW consulted for substance abuse   02/04/18 Pt remains intubated .  Pt is from home with husband.  Pt will need PT eval when appropriate Maryclare Labrador, RN 02/08/2018, 2:06 PM

## 2018-02-08 NOTE — Progress Notes (Signed)
Patient off precedex and tolerating it off well.  Remains delirious but protecting her airway and interactive with sitter.  Transfer to SDU and to [facility] service with PCCM off 5/8.  Failed swallow, give sublingual clonazepam.    Rush Farmer, M.D. [facility] Pulmonary/Critical Care Medicine. Pager: [phone]. After hours pager: [phone].

## 2018-02-08 NOTE — Progress Notes (Signed)
PULMONARY / CRITICAL CARE MEDICINE   Name: Natalie Morrison MRN: [id number] DOB: 09-05-1971    ADMISSION DATE:  01/27/2018 CONSULTATION DATE:  01/27/2018  REFERRING MD:  Opyd  CHIEF COMPLAINT:  dyspnea  BRIEF SUMMARY:   47 y/o female with asthma, EtOH abuse admitted CAP, developed ARDS.  Self extubated 5/2 early am requiring reintubation.   SUBJECTIVE:   Tolerated extubation Very delirious  VITAL SIGNS: BP (!) 150/89   Pulse 68   Temp 97.7 F (36.5 C) (Oral)   Resp (!) 22   Ht _0  (1.676 m)   Wt 136 lb 14.5 oz (62.1 kg)   SpO2 91%   BMI 22.10 kg/m   HEMODYNAMICS:    VENTILATOR SETTINGS: Vent Mode: PSV;CPAP FiO2 (%):  [32 %-40 %] 32 % PEEP:  [5 cmH20] 5 cmH20 Pressure Support:  [5 cmH20] 5 cmH20  INTAKE / OUTPUT: I/O last 3 completed shifts: In: 2503.3 [I.V.:1478.3; NG/GT:875; IV Piggyback:150] Out: 1443 [Urine:5320; Emesis/NG output:350]  PHYSICAL EXAMINATION:  General:  Confused, chronically ill appearing HENT: [place]/AT, PERRL, EOM-I and MMM PULM: CTA bilaterally CV: RRR, Nl S1/S2 and -M/R/G GI: Soft, NT, ND and +BS MSK: Normal bulk and tone Neuro: Delirious but moving all ext to command  LABS:  BMET Recent Labs  Lab 02/06/18 0919 02/07/18 0941 02/08/18 0345  NA 136 137 138  K 4.1 5.2* 3.8  CL 102 105 104  CO2 _1 BUN 20 21* 24*  CREATININE 0.45 0.49 0.56  GLUCOSE 164* 177* 118*   Electrolytes Recent Labs  Lab 02/02/18 0333  02/06/18 0919 02/07/18 0941 02/08/18 0345  CALCIUM 8.8*   < > 8.9 9.0 9.0  MG 1.9  --   --  2.2 2.0  PHOS 4.8*  --   --   --  4.7*   < > = values in this interval not displayed.   CBC Recent Labs  Lab 02/04/18 0340 02/06/18 0552 02/08/18 0345  WBC 14.5* 16.8* 20.8*  HGB 8.9* 9.8* 10.2*  HCT 28.1* 29.9* 31.4*  PLT 394 538* 763*   Coag's No results for input(s): APTT, INR in the last 168 hours.  Sepsis Markers No results for input(s): LATICACIDVEN, PROCALCITON, O2SATVEN in the last 168  hours.  ABG Recent Labs  Lab 02/08/18 0240  PHART 7.564*  PCO2ART 24.9*  PO2ART 170*   Liver Enzymes No results for input(s): AST, ALT, ALKPHOS, BILITOT, ALBUMIN in the last 168 hours.  Cardiac Enzymes No results for input(s): TROPONINI, PROBNP in the last 168 hours.  Glucose Recent Labs  Lab 02/07/18 1132 02/07/18 1524 02/07/18 1948 02/07/18 2318 02/08/18 0342 02/08/18 0759  GLUCAP 209* 133* 134* 119* 122* 127*   Imaging No results found.  LINES / TUBES: ETT/OG 4/25 >> 5/2, 5/2 >> 5/4, 5/4 >   CULTURES: Sputum 4/25 >> normal flora  BCx2 4/25 >> negative  BAL 4/29 >> negative  AFB/Fungal 4/29 >>   ANTIBIOTICS: Vancomycin 4/25> 4/26 Doxycycline 4/25 >> 4/30 Rocephin 4/25 >> 4/30  SIGNIFICANT EVENTS:  4/25 Intubated and admitted to the ICU  4/29  Bronchoscopy with BAL 5/02  Self extubated, reintubated  5/4 extubated, stridor, re-intubated  DISCUSSION: 48 y/o female with severe CAP causing ARDS.  Has a history of EtOH abuse.   ASSESSMENT / PLAN:  PULMONARY A: ARDS - improving, 5/5 CXR with bilateral air space disease improved Severe CAP Asthma Stridor> some laryngeal edema 5/4 on re-intubation P:   Monitor for airway protection VAP prevention Daily WUA/SBT  Continue decadron D/C further lasix  CARDIOVASCULAR A:  Sedation Related Hypotension  > resolved when not on sedation 5/4 P:  Tele QTc monitoring with seroquel  RENAL A:   No acute issues P:   Monitor BMET and UOP Replace electrolytes as needed KVO IVF  GASTROINTESTINAL A:   Constipation > improved P:   SLP today if fails RN bedside assessment Continue medical stress ulcer prophylaxis Would use methylnaltrexone if recurs Continue bowel regimen  HEMATOLOGIC A:   Anemia without bleeding P:  Monitor for bleeding Transfuse PRBC for Hgb < 7 gm/dL Lovenox for DVT prophylaxis  INFECTIOUS A:   Severe CAP - treated  Low grade fever> due to ARDS, WBC down, clinically  improved P:   Monitor for hemodynamic change Monitor off of antibiotics  ENDOCRINE A:   Hyperglycemia    P:   Monitor glucose Decrease dexamethasone to 1 mg IV BID, and likely d/c in AM   NEUROLOGIC A:   Severe anxiety, agitation requiring heavy sedation for vent synchrony  Hx Depression, Anxiety, ETOH Abuse Likely tachyphylaxis P:   Clonazepam to 1 mg disintegrating BID Restart seroquel 200 mg PO BID once passes swallow evaluation Monitor QTc closely> EKG now D/C dilaudid drip Precedex drip RASS goal -2 per PD protocol Safety sitter CIWA protocol  FAMILY  - Updates:  No family bedside 5/7, continues to require precedex for withdrawal, will continue to monitor today.  Restart clonazepam with the hope of getting off the precedex, seroquel as above.  Hold in the ICU while on precedex.  The patient is critically ill with multiple organ systems failure and requires high complexity decision making for assessment and support, frequent evaluation and titration of therapies, application of advanced monitoring technologies and extensive interpretation of multiple databases.   Critical Care Time devoted to patient care services described in this note is  31  Minutes. This time reflects time of care of this signee Dr Jennet Maduro. This critical care time does not reflect procedure time, or teaching time or supervisory time of PA/NP/Med student/Med Resident etc but could involve care discussion time.  Rush Farmer, M.D. [facility] Pulmonary/Critical Care Medicine. Pager: [phone]. After hours pager: [phone].

## 2018-02-08 NOTE — Progress Notes (Signed)
[place] Progress Note Patient Name: Natalie Morrison DOB: 1970/10/19 MRN: [id number]   Date of Service  02/08/2018  HPI/Events of Note  Delirium - Request to renew soft 5 point restraints.   eICU Interventions  Will renew soft 5 point restraints.      Intervention Category Major Interventions: Delirium, psychosis, severe agitation - evaluation and management  Sommer,Steven Eugene 02/08/2018, 11:44 PM

## 2018-02-08 NOTE — Progress Notes (Signed)
Nutrition Follow-up  DOCUMENTATION CODES:   Not applicable  INTERVENTION:    Recommend swallow evaluation with SLP.  If unable to safely advance PO diet, consider Cortrak tube for TF, Cortrak service is available M-W-F-Sat.   If TF required, Jevity 1.2 at 60 ml/h would provide 1728 kcal, 80 gm protein, 1166 ml free water daily  NUTRITION DIAGNOSIS:   Inadequate oral intake related to inability to eat as evidenced by NPO status.  Ongoing  GOAL:   Provide needs based on ASPEN/SCCM guidelines  Met with TF  MONITOR:   Vent status, TF tolerance, I & O's, Skin, Labs, Weight trends  ASSESSMENT:   47 y.o. F admitted on 01/27/18 for acute respiratory failure with hypoxia with subsequent sepsis from pneumonia - possible ARDS. PMH of alcohol abuse, depression/anxeity, asthma.  Extubated 5/6. TF off, feeding tube removed with extubation. Patient confused, sitter at bedside.  Currently off Precedex.  Failed RN swallow screen today. Remains NPO. Labs and medications reviewed.   Diet Order:   Diet Order    None      EDUCATION NEEDS:   Not appropriate for education at this time  Skin:  Skin Assessment: Reviewed RN Assessment  Last BM:  5/3 (type 6)  Height:   Ht Readings from Last 1 Encounters:  02/04/18 _0  (1.676 m)    Weight:   Wt Readings from Last 1 Encounters:  02/08/18 136 lb 14.5 oz (62.1 kg)    Ideal Body Weight:  59.09 kg  BMI:  Body mass index is 22.1 kg/m.  Estimated Nutritional Needs:   Kcal:  1600-1800  Protein:  80-90 gm  Fluid:  1.6-1.8 L    Molli Barrows, RD, LDN, [place] Pager [phone] After Hours Pager [phone]

## 2018-02-08 NOTE — Progress Notes (Signed)
[place] Progress Note Patient Name: Natalie Morrison DOB: Jul 24, 1971 MRN: [id number]   Date of Service  02/08/2018  HPI/Events of Note  Patient arrives on stepdown unit with HR = 119 and sweating profusely. Nursing concerned with ETOH W/D given Hx of ETOH abuse.   eICU Interventions  Will order: 1. Stepdown CIWA protocol.      Intervention Category Major Interventions: Delirium, psychosis, severe agitation - evaluation and management  Bradleigh Sonnen Eugene 02/08/2018, 8:56 PM

## 2018-02-09 ENCOUNTER — Inpatient Hospital Stay (HOSPITAL_COMMUNITY): Payer: BLUE CROSS/BLUE SHIELD

## 2018-02-09 LAB — CBC
HCT: 31.7 % — ABNORMAL LOW (ref 36.0–46.0)
Hemoglobin: 10.2 g/dL — ABNORMAL LOW (ref 12.0–15.0)
MCH: 33.9 pg (ref 26.0–34.0)
MCHC: 32.2 g/dL (ref 30.0–36.0)
MCV: 105.3 fL — ABNORMAL HIGH (ref 78.0–100.0)
Platelets: 928 10*3/uL (ref 150–400)
RBC: 3.01 MIL/uL — ABNORMAL LOW (ref 3.87–5.11)
RDW: 13.2 % (ref 11.5–15.5)
WBC: 21.1 10*3/uL — ABNORMAL HIGH (ref 4.0–10.5)

## 2018-02-09 LAB — GLUCOSE, CAPILLARY
Glucose-Capillary: 99 mg/dL (ref 65–99)
Glucose-Capillary: 103 mg/dL — ABNORMAL HIGH (ref 65–99)
Glucose-Capillary: 103 mg/dL — ABNORMAL HIGH (ref 65–99)
Glucose-Capillary: 111 mg/dL — ABNORMAL HIGH (ref 65–99)
Glucose-Capillary: 106 mg/dL — ABNORMAL HIGH (ref 65–99)

## 2018-02-09 LAB — MAGNESIUM: Magnesium: 2 mg/dL (ref 1.7–2.4)

## 2018-02-09 LAB — PHOSPHORUS: Phosphorus: 3.9 mg/dL (ref 2.5–4.6)

## 2018-02-09 LAB — BASIC METABOLIC PANEL
Anion gap: 12 (ref 5–15)
BUN: 29 mg/dL — ABNORMAL HIGH (ref 6–20)
CO2: 22 mmol/L (ref 22–32)
Calcium: 9 mg/dL (ref 8.9–10.3)
Chloride: 108 mmol/L (ref 101–111)
Creatinine, Ser: 0.69 mg/dL (ref 0.44–1.00)
GFR calc Af Amer: >60 mL/min (ref >60)
GFR calc non Af Amer: >60 mL/min (ref >60)
Glucose, Bld: 101 mg/dL — ABNORMAL HIGH (ref 65–99)
Potassium: 3.1 mmol/L — ABNORMAL LOW (ref 3.5–5.1)
Sodium: 142 mmol/L (ref 135–145)

## 2018-02-09 LAB — PATHOLOGIST SMEAR REVIEW

## 2018-02-09 MED ORDER — LIDOCAINE VISCOUS 2 % MT SOLN
3.0000 mL | Freq: Once | OROMUCOSAL | Status: AC
  Administered 2018-02-09: 3 mL via OROMUCOSAL
  Filled 2018-02-09: qty 15

## 2018-02-09 MED ORDER — JEVITY 1.2 CAL PO LIQD
1000.0000 mL | ORAL | Status: DC
  Administered 2018-02-09 – 2018-02-13 (×6): 1000 mL
  Filled 2018-02-09 (×10): qty 1000

## 2018-02-09 MED ORDER — LIDOCAINE VISCOUS 2 % MT SOLN
OROMUCOSAL | Status: AC
  Filled 2018-02-09: qty 15

## 2018-02-09 MED ORDER — IOPAMIDOL (ISOVUE-300) INJECTION 61%
INTRAVENOUS | Status: AC
  Filled 2018-02-09: qty 50

## 2018-02-09 MED ORDER — METOPROLOL TARTRATE 25 MG PO TABS
25.0000 mg | ORAL_TABLET | Freq: Two times a day (BID) | ORAL | Status: DC
  Administered 2018-02-09 – 2018-02-11 (×4): 25 mg via ORAL
  Filled 2018-02-09 (×4): qty 1

## 2018-02-09 MED ORDER — IOPAMIDOL (ISOVUE-300) INJECTION 61%
50.0000 mL | Freq: Once | INTRAVENOUS | Status: AC | PRN
  Administered 2018-02-09: 15 mL via ORAL

## 2018-02-09 MED ORDER — POTASSIUM CHLORIDE 10 MEQ/100ML IV SOLN
10.0000 meq | INTRAVENOUS | Status: AC
  Administered 2018-02-09 (×3): 10 meq via INTRAVENOUS
  Filled 2018-02-09 (×3): qty 100

## 2018-02-09 NOTE — Progress Notes (Addendum)
Nutrition Brief Note  Cortrak Tube Team unable to place feeding tube. Hiatal hernia suspected. IR to place small bore feeding tube under fluoroscopy. This RD ordered Jevity 1.2 formula via Adult Tube Feeding Protocol.  Will initiate at 20 ml/hr and increase by 10 ml every 4 hours to goal rate of 60 ml/hr. Provides 1728 kcals, 80 gm protein, 1166 ml of free water daily. RD to continue to follow.  Arthur Holms, RD, LDN Pager #: [phone] After-Hours Pager #: [phone]

## 2018-02-09 NOTE — Progress Notes (Signed)
CRITICAL VALUE ALERT  Critical Value: PLT 928  Date & Time Notified: 02/09/18 @ 0323  Provider Notified: Dr. Oletta Darter w/ [facility]  RN will continue to monitor.

## 2018-02-09 NOTE — Progress Notes (Signed)
[facility] PROGRESS NOTE  Natalie Morrison PFY:[id number] DOB: Jul 24, 1971 DOA: 01/27/2018 PCP: Eulas Post, MD  Brief summary   47 y.o. female with medical history significant for alcohol abuse, depression with anxiety, and history of asthma, presented to the emergency department for evaluation of cough, shortness of breath, and cyanosis. Recently  treated with antibiotic as outpatient for bronchitis.  Her condition has worsened significantly over the past day, her husband found her to have markedly labored breathing and a bluish-purple hue, prompting her presentation to the ED. Noted to develop rapid respiratory failure, transferred to ICU, intubated. Complicated with pneumonia, ARDS, alcohol withdrawals. Required precedex. Self extubated on 5/2. Required reintubation on 5/4. Noted to have laryngeal edema, no won steroids. TFred to [facility] on 5/8   Assessment/Plan:  Sepsis secondary to PNA. ARDS. acute hypoxic respiratory failure. Extubated.  Completed antibiotic treatment while in ICU. Afebrile today. Stridor, some laryngeal edema 5/4 on re-intubation, on steroids per PCCM.  -failed speech/swallow. Will need tube feed temporarily    Hypokalemia. Will replace IV. Monitor   Alcohol dependence. etoh withdrawals. Required precedex , then ransitioned to clonazepam, Seroquel in ICU. Will cont monitor with CIWA and prn Ativan, supplement vitamins and minerals   Code Status: full Family Communication: d/w patient, RN (indicate person spoken with, relationship, and if by phone, the number) Disposition Plan: remains inpatient    Consultants:  PCCM CULTURES: Sputum 4/25 >> normal flora  BCx2 4/25 >> negative  BAL 4/29 >> negative  AFB/Fungal 4/29 >>   ANTIBIOTICS: Vancomycin 4/25> 4/26 Doxycycline 4/25 >>4/30 Rocephin 4/25 >>4/30  SIGNIFICANT EVENTS:  4/25 Intubated and admitted to the ICU 4/29  Bronchoscopywith BAL 5/02  Self extubated, reintubated  5/4 extubated,  stridor, re-intubated   HPI/Subjective: Alert. Failed speech eval. Agreed for Corpak for feeding, meds   Objective: Vitals:   02/09/18 0718 02/09/18 0814  BP: 140/87   Pulse: (!) 126   Resp: (!) 22   Temp:    SpO2: 96% 93%    Intake/Output Summary (Last 24 hours) at 02/09/2018 1153 Last data filed at 02/09/2018 0500 Gross per 24 hour  Intake 140 ml  Output 1185 ml  Net -1045 ml   Filed Weights   02/07/18 0500 02/08/18 0357 02/09/18 0513  Weight: 66.2 kg (145 lb 15.1 oz) 62.1 kg (136 lb 14.5 oz) 60.8 kg (134 lb 0.6 oz)    Exam:   General:  No distress   Cardiovascular: s1,s2 mild tachycardia   Respiratory: diminished in LL  Abdomen: soft, nt   Musculoskeletal: no leg edema    Data Reviewed: Basic Metabolic Panel: Recent Labs  Lab 02/05/18 0926 02/06/18 0919 02/07/18 0941 02/08/18 0345 02/09/18 0237  NA 136 136 137 138 142  K 4.1 4.1 5.2* 3.8 3.1*  CL 98* 102 105 104 108  CO2 _0 GLUCOSE 117* 164* 177* 118* 101*  BUN 15 20 21* 24* 29*  CREATININE 0.51 0.45 0.49 0.56 0.69  CALCIUM 8.7* 8.9 9.0 9.0 9.0  MG  --   --  2.2 2.0 2.0  PHOS  --   --   --  4.7* 3.9   Liver Function Tests: No results for input(s): AST, ALT, ALKPHOS, BILITOT, PROT, ALBUMIN in the last 168 hours. No results for input(s): LIPASE, AMYLASE in the last 168 hours. No results for input(s): AMMONIA in the last 168 hours. CBC: Recent Labs  Lab 02/03/18 0500 02/04/18 0340 02/06/18 0552 02/08/18 0345 02/09/18 0237  WBC  19.8* 14.5* 16.8* 20.8* 21.1*  NEUTROABS 14.0*  --   --   --   --   HGB 9.9* 8.9* 9.8* 10.2* 10.2*  HCT 30.3* 28.1* 29.9* 31.4* 31.7*  MCV 109.0* 108.9* 104.9* 105.4* 105.3*  PLT 389 394 538* 763* 928*   Cardiac Enzymes: No results for input(s): CKTOTAL, CKMB, CKMBINDEX, TROPONINI in the last 168 hours. BNP (last 3 results) No results for input(s): BNP in the last 8760 hours.  ProBNP (last 3 results) No results for input(s): PROBNP in the last 8760  hours.  CBG: Recent Labs  Lab 02/08/18 1719 02/08/18 2037 02/08/18 2331 02/09/18 0419 02/09/18 0751  GLUCAP 102* 95 91 99 103*    Recent Results (from the past 240 hour(s))  Culture, fungus without smear     Status: None (Preliminary result)   Collection Time: 01/31/18  4:47 PM  Result Value Ref Range Status   Specimen Description BRONCHIAL ALVEOLAR LAVAGE  Final   Special Requests LUNG LEFT  Final   Culture   Final    NO FUNGUS ISOLATED AFTER 9 DAYS Performed at [facility] [address], [place], [place] [address]    Report Status PENDING  Incomplete  Acid Fast Smear (AFB)     Status: None   Collection Time: 01/31/18  4:47 PM  Result Value Ref Range Status   AFB Specimen Processing Concentration  Final   Acid Fast Smear Negative  Final    Comment: (NOTE) Performed At: [facility] [place], [place] [id number] Rush Farmer MD VO:[id number]    Source (AFB) BRONCHIAL ALVEOLAR LAVAGE  Final    Comment: LUNG LEFT   Culture, respiratory (NON-Expectorated)     Status: None   Collection Time: 01/31/18  4:47 PM  Result Value Ref Range Status   Specimen Description BRONCHIAL ALVEOLAR LAVAGE  Final   Special Requests LUNG LEFT  Final   Gram Stain   Final    FEW WBC PRESENT, PREDOMINANTLY PMN NO ORGANISMS SEEN    Culture   Final    NO GROWTH 2 DAYS Performed at [facility], [address], [place], [place] [address]    Report Status 02/03/2018 FINAL  Final     Studies: Dg Chest Port 1 View  Result Date: 02/08/2018 CLINICAL DATA:  Respiratory difficulty EXAM: PORTABLE CHEST 1 VIEW COMPARISON:  02/06/2018 FINDINGS: Cardiac shadow is stable. Left-sided PICC line is again noted in satisfactory position. The nasogastric catheter and endotracheal tube have been removed in the interval. Diffuse bilateral patchy opacities are noted throughout both lungs stable from the previous exam. The overall inspiratory effort is worse with some  crowding of the lung opacities. IMPRESSION: Decreased respiratory effort when compared with the prior exam. Diffuse patchy opacities are again seen throughout both lungs. Electronically Signed   By: Inez Catalina M.D.   On: 02/08/2018 09:21    Scheduled Meds: . bacitracin-polymyxin b   Both Eyes QID  . chlorhexidine  15 mL Mouth Rinse BID  . clonazepam  1 mg Oral BID  . dexamethasone  1 mg Oral Q12H  . docusate  100 mg Per Tube Daily  . enoxaparin (LOVENOX) injection  40 mg Subcutaneous Q24H  . folic acid  1 mg Intravenous Daily  . insulin aspart  0-9 Units Subcutaneous Q4H  . mouth rinse  15 mL Mouth Rinse q12n4p  . nicotine  21 mg Transdermal Daily  . polyethylene glycol  17 g Per Tube Daily  .  polyvinyl alcohol  1 drop Both Eyes Q4H  . QUEtiapine  200 mg Per Tube BID  . sennosides  5 mL Per Tube Daily  . sodium chloride flush  10-40 mL Intracatheter Q12H  . thiamine injection  100 mg Intravenous Daily   Continuous Infusions: . sodium chloride 10 mL/hr at 02/08/18 1500  . dexmedetomidine Stopped (02/08/18 1145)    Principal Problem:   Acute respiratory failure with hypoxia (HCC) Active Problems:   Alcohol abuse   Sepsis due to pneumonia ([place])   Hypokalemia   Prolonged QT interval   Acute respiratory failure with hypoxia and hypercapnia (HCC)   Hypomagnesemia   Community acquired pneumonia   Airway intubation performed without difficulty    Time spent: >35 minutes     Kinnie Feil  [facility] Pager [phone]. If 7PM-7AM, please contact night-coverage at [email or web address], password [facility] 02/09/2018, 11:53 AM  LOS: 13 days

## 2018-02-09 NOTE — Progress Notes (Signed)
Cortrak Tube Team Note:  Consult received to place a Cortrak feeding tube.   Cortrak tube unable to be advanced past the LES into the stomach. After repeated attempts tube continued to coil in the esophagus. Recommend IR placement of tube.   Koleen Distance MS, RD, LDN Pager #- [phone] After Hours Pager: [phone]

## 2018-02-09 NOTE — Evaluation (Signed)
Clinical/Bedside Swallow Evaluation Patient Details  Name: Natalie Morrison MRN: [id number] Date of Birth: 1970/12/24  Today's Date: 02/09/2018 Time: SLP Start Time (ACUTE ONLY): 6606 SLP Stop Time (ACUTE ONLY): 3016 SLP Time Calculation (min) (ACUTE ONLY): 10 min  Past Medical History:  Past Medical History:  Diagnosis Date  . Alcohol abuse   . Anemia   . Anxiety   . Anxiety and depression   . Asthma   . Depression   . Hepatitis A    "when I was a kid"  . Hypertension    Past Surgical History:  Past Surgical History:  Procedure Laterality Date  . BREAST LUMPECTOMY Right   . TONSILLECTOMY AND ADENOIDECTOMY Bilateral over 30 years ago   HPI:  47 y/o female with severe CAP causing ARDS. ETT x3: 4/25- 5/2 (self-extubated), 5/2-5-4, 5/4-5/7. PMH: EtOH abuse, HTN, hepatitis A, depression, asthma, anxiety, anemia   Assessment / Plan / Recommendation Clinical Impression  Pt has signs of an acute, likely reversible dysphagia s/p prolonged intubation. She is aphonic s/p three intubations, one self-extubation, and mentation limits her ability to follow commands. Immediate coughing follows trials of ice chips. Pt is not yet ready for PO diet. Will continue to follow for readiness to participate in instrumental testing as her mentation improves and/or we see signs of improved oropharyngeal function, such as improving vocal quality or less overt signs of aspiration at bedside. SLP Visit Diagnosis: Dysphagia, unspecified (R13.10)    Aspiration Risk  Severe aspiration risk    Diet Recommendation NPO   Medication Administration: Via alternative means    Other  Recommendations Oral Care Recommendations: Oral care QID   Follow up Recommendations (tba)      Frequency and Duration min 2x/week  2 weeks       Prognosis Prognosis for Safe Diet Advancement: Good Barriers to Reach Goals: Cognitive deficits      Swallow Study   General HPI: 47 y/o female with severe CAP causing ARDS. ETT  x3: 4/25- 5/2 (self-extubated), 5/2-5-4, 5/4-5/7. PMH: EtOH abuse, HTN, hepatitis A, depression, asthma, anxiety, anemia Type of Study: Bedside Swallow Evaluation Previous Swallow Assessment: none in chart Diet Prior to this Study: NPO Temperature Spikes Noted: No Respiratory Status: Nasal cannula History of Recent Intubation: Yes Length of Intubations (days): 13 days(across three intubations) Date extubated: 02/08/18 Behavior/Cognition: Alert;Confused;Requires cueing;Distractible Oral Cavity Assessment: Within Functional Limits Oral Care Completed by SLP: No Oral Cavity - Dentition: Adequate natural dentition Patient Positioning: Upright in bed Baseline Vocal Quality: Aphonic Volitional Cough: Cognitively unable to elicit Volitional Swallow: Unable to elicit    Oral/Motor/Sensory Function Overall Oral Motor/Sensory Function: Within functional limits   Ice Chips Ice chips: Impaired Presentation: Spoon Pharyngeal Phase Impairments: Cough - Immediate   Thin Liquid Thin Liquid: Not tested    Nectar Thick Nectar Thick Liquid: Not tested   Honey Thick Honey Thick Liquid: Not tested   Puree Puree: Not tested   Solid   GO   Solid: Not tested        Germain Osgood 02/09/2018,9:03 AM  Germain Osgood, M.A. CCC-SLP [phone]

## 2018-02-09 NOTE — Progress Notes (Signed)
[facility]rak Team Note:   Received phone call from Radiology requesting bridle placement  Pt seen in radiology. 10 French feeding tube placed by IR in LEFT nare and this RD inserted nasal bridle and secured tube in place  [employer]ner MS, RD, LDN, CNSC [phone] Pager  [phone] Weekend/On-Call Pager

## 2018-02-10 LAB — GLUCOSE, CAPILLARY
Glucose-Capillary: 107 mg/dL — ABNORMAL HIGH (ref 65–99)
Glucose-Capillary: 119 mg/dL — ABNORMAL HIGH (ref 65–99)
Glucose-Capillary: 126 mg/dL — ABNORMAL HIGH (ref 65–99)
Glucose-Capillary: 136 mg/dL — ABNORMAL HIGH (ref 65–99)
Glucose-Capillary: 137 mg/dL — ABNORMAL HIGH (ref 65–99)
Glucose-Capillary: 139 mg/dL — ABNORMAL HIGH (ref 65–99)
Glucose-Capillary: 141 mg/dL — ABNORMAL HIGH (ref 65–99)

## 2018-02-10 LAB — BASIC METABOLIC PANEL
Anion gap: 9 (ref 5–15)
BUN: 19 mg/dL (ref 6–20)
CO2: 22 mmol/L (ref 22–32)
Calcium: 9.2 mg/dL (ref 8.9–10.3)
Chloride: 111 mmol/L (ref 101–111)
Creatinine, Ser: 0.6 mg/dL (ref 0.44–1.00)
GFR calc Af Amer: >60 mL/min (ref >60)
GFR calc non Af Amer: >60 mL/min (ref >60)
Glucose, Bld: 150 mg/dL — ABNORMAL HIGH (ref 65–99)
Potassium: 3.8 mmol/L (ref 3.5–5.1)
Sodium: 142 mmol/L (ref 135–145)

## 2018-02-10 MED ORDER — FOLIC ACID 1 MG PO TABS
1.0000 mg | ORAL_TABLET | Freq: Every day | ORAL | Status: DC
  Administered 2018-02-11 – 2018-02-14 (×4): 1 mg
  Filled 2018-02-10 (×4): qty 1

## 2018-02-10 MED ORDER — DEXAMETHASONE 0.5 MG PO TABS
1.0000 mg | ORAL_TABLET | Freq: Two times a day (BID) | ORAL | Status: AC
  Administered 2018-02-10 – 2018-02-11 (×2): 1 mg via ORAL
  Filled 2018-02-10 (×2): qty 2

## 2018-02-10 NOTE — Progress Notes (Signed)
[facility] PROGRESS NOTE  ZORAH BACKES ZRA:[id number] DOB: March 30, 1971 DOA: 01/27/2018 PCP: Eulas Post, MD  Brief summary   47 y.o. female with medical history significant for alcohol abuse, depression with anxiety, and history of asthma, presented to the emergency department for evaluation of cough, shortness of breath, and cyanosis. Recently  treated with antibiotic as outpatient for bronchitis.  Her condition has worsened significantly over the past day, her husband found her to have markedly labored breathing and a bluish-purple hue, prompting her presentation to the ED. Noted to develop rapid respiratory failure, transferred to ICU, intubated. Complicated with pneumonia, ARDS, alcohol withdrawals. Required precedex. Self extubated on 5/2. Required reintubation on 5/4. Noted to have laryngeal edema, no won steroids. TFred to [facility] on 5/8   Assessment/Plan:  Sepsis secondary to PNA. ARDS. acute hypoxic respiratory failure. Extubated.  Completed antibiotic treatment while in ICU. Afebrile . Stridor, some laryngeal edema 5/4 on re-intubation, on steroids per PCCM.  -failed speech/swallow. Started on dobbhoff with tube feed. Needs repeat speech eval in 24-48 hrs    Hypokalemia. replace. Monitor   Alcohol dependence. etoh withdrawals. Required precedex , then ransitioned to clonazepam, Seroquel in ICU. Will cont monitor with CIWA and prn Ativan, supplement vitamins and minerals. Tachycardia, tremulousness: improved    Code Status: full Family Communication: d/w patient, RN (indicate person spoken with, relationship, and if by phone, the number). Recently updated her husband  Disposition Plan: remains inpatient    Consultants:  PCCM CULTURES: Sputum 4/25 >> normal flora  BCx2 4/25 >> negative  BAL 4/29 >> negative  AFB/Fungal 4/29 >>   ANTIBIOTICS: Vancomycin 4/25> 4/26 Doxycycline 4/25 >>4/30 Rocephin 4/25 >>4/30  SIGNIFICANT EVENTS:  4/25 Intubated and  admitted to the ICU 4/29  Bronchoscopywith BAL 5/02  Self extubated, reintubated  5/4 extubated, stridor, re-intubated   HPI/Subjective: Alert. Failed speech eval. Agreed for Corpak for feeding, meds   Objective: Vitals:   02/10/18 0708 02/10/18 0921  BP: 124/76 122/80  Pulse: 100 99  Resp: (!) 24   Temp: 99.1 F (37.3 C)   SpO2: 96%     Intake/Output Summary (Last 24 hours) at 02/10/2018 0936 Last data filed at 02/10/2018 5456 Gross per 24 hour  Intake 1079 ml  Output 1100 ml  Net -21 ml   Filed Weights   02/08/18 0357 02/09/18 0513 02/10/18 0500  Weight: 62.1 kg (136 lb 14.5 oz) 60.8 kg (134 lb 0.6 oz) 57.6 kg (126 lb 15.8 oz)    Exam:   General:  No distress   Cardiovascular: s1,s2 mild tachycardia   Respiratory: diminished in LL  Abdomen: soft, nt   Musculoskeletal: no leg edema    Data Reviewed: Basic Metabolic Panel: Recent Labs  Lab 02/06/18 0919 02/07/18 0941 02/08/18 0345 02/09/18 0237 02/10/18 0246  NA 136 137 138 142 142  K 4.1 5.2* 3.8 3.1* 3.8  CL 102 105 104 108 111  CO2 _0 GLUCOSE 164* 177* 118* 101* 150*  BUN 20 21* 24* 29* 19  CREATININE 0.45 0.49 0.56 0.69 0.60  CALCIUM 8.9 9.0 9.0 9.0 9.2  MG  --  2.2 2.0 2.0  --   PHOS  --   --  4.7* 3.9  --    Liver Function Tests: No results for input(s): AST, ALT, ALKPHOS, BILITOT, PROT, ALBUMIN in the last 168 hours. No results for input(s): LIPASE, AMYLASE in the last 168 hours. No results for input(s): AMMONIA in the last 168 hours.  CBC: Recent Labs  Lab 02/04/18 0340 02/06/18 0552 02/08/18 0345 02/09/18 0237  WBC 14.5* 16.8* 20.8* 21.1*  HGB 8.9* 9.8* 10.2* 10.2*  HCT 28.1* 29.9* 31.4* 31.7*  MCV 108.9* 104.9* 105.4* 105.3*  PLT 394 538* 763* 928*   Cardiac Enzymes: No results for input(s): CKTOTAL, CKMB, CKMBINDEX, TROPONINI in the last 168 hours. BNP (last 3 results) No results for input(s): BNP in the last 8760 hours.  ProBNP (last 3 results) No results  for input(s): PROBNP in the last 8760 hours.  CBG: Recent Labs  Lab 02/09/18 1650 02/09/18 2059 02/10/18 0011 02/10/18 0322 02/10/18 0814  GLUCAP 111* 106* 126* 141* 119*    Recent Results (from the past 240 hour(s))  Culture, fungus without smear     Status: None (Preliminary result)   Collection Time: 01/31/18  4:47 PM  Result Value Ref Range Status   Specimen Description BRONCHIAL ALVEOLAR LAVAGE  Final   Special Requests LUNG LEFT  Final   Culture   Final    NO GROWTH 10 DAYS Performed at [facility] [address], [place], [place] [address]    Report Status PENDING  Incomplete  Acid Fast Smear (AFB)     Status: None   Collection Time: 01/31/18  4:47 PM  Result Value Ref Range Status   AFB Specimen Processing Concentration  Final   Acid Fast Smear Negative  Final    Comment: (NOTE) Performed At: [facility] [place], [place] [id number] Rush Farmer MD GU:[id number]    Source (AFB) BRONCHIAL ALVEOLAR LAVAGE  Final    Comment: LUNG LEFT   Culture, respiratory (NON-Expectorated)     Status: None   Collection Time: 01/31/18  4:47 PM  Result Value Ref Range Status   Specimen Description BRONCHIAL ALVEOLAR LAVAGE  Final   Special Requests LUNG LEFT  Final   Gram Stain   Final    FEW WBC PRESENT, PREDOMINANTLY PMN NO ORGANISMS SEEN    Culture   Final    NO GROWTH 2 DAYS Performed at [facility], [address], [place], [place] [address]    Report Status 02/03/2018 FINAL  Final     Studies: Dg Abd 1 View  Result Date: 02/09/2018 INDICATION: Need for feeding catheter EXAM: ABDOMEN - 1 VIEW COMPARISON:  02/02/2018 CONTRAST:  15 mL Isovue FLUOROSCOPY TIME:  2 minutes. 54 seconds. COMPLICATIONS: None immediate PROCEDURE: The Dobbhoff tube was lubricated with viscous lidocaine inserted into the right nostril. Under intermittent fluoroscopic guidance, the Dobbhoff tube was advanced through the stomach, through the duodenum with  tip ultimately terminating over the expected location of the second portion of the duodenum. Contrast material was injected to confirm positioning a spot fluoroscopic image was saved for documentation purposes. The tube was affixed to the patient's nose with tape. The patient tolerated the procedure well without immediate postprocedural complication. FINDINGS: Placement of a feeding catheter into the second portion of the duodenum. IMPRESSION: Successful fluoroscopic guided placement of Dobbhoff tube with tip terminating over the duodenojejunal junction. The tube is ready for immediate use. CLINICAL DATA:  Feeding catheter placement Electronically Signed   By: Inez Catalina M.D.   On: 02/09/2018 15:47    Scheduled Meds: . bacitracin-polymyxin b   Both Eyes QID  . chlorhexidine  15 mL Mouth Rinse BID  . clonazepam  1 mg Oral BID  . dexamethasone  1 mg Oral Q12H  . docusate  100 mg Per Tube Daily  .  enoxaparin (LOVENOX) injection  40 mg Subcutaneous Q24H  . folic acid  1 mg Intravenous Daily  . insulin aspart  0-9 Units Subcutaneous Q4H  . mouth rinse  15 mL Mouth Rinse q12n4p  . metoprolol tartrate  25 mg Oral BID  . nicotine  21 mg Transdermal Daily  . polyethylene glycol  17 g Per Tube Daily  . polyvinyl alcohol  1 drop Both Eyes Q4H  . QUEtiapine  200 mg Per Tube BID  . sennosides  5 mL Per Tube Daily  . sodium chloride flush  10-40 mL Intracatheter Q12H  . thiamine injection  100 mg Intravenous Daily   Continuous Infusions: . sodium chloride 10 mL/hr at 02/08/18 1500  . feeding supplement (JEVITY 1.2 CAL) 1,000 mL (02/09/18 1521)    Principal Problem:   Acute respiratory failure with hypoxia (HCC) Active Problems:   Alcohol abuse   Sepsis due to pneumonia ([place])   Hypokalemia   Prolonged QT interval   Acute respiratory failure with hypoxia and hypercapnia (HCC)   Hypomagnesemia   Community acquired pneumonia   Airway intubation performed without difficulty    Time spent: >35  minutes     Kinnie Feil  [facility] Pager [phone]. If 7PM-7AM, please contact night-coverage at [email or web address], password [facility] 02/10/2018, 9:36 AM  LOS: 14 days

## 2018-02-10 NOTE — Progress Notes (Signed)
  Speech Language Pathology Treatment: Dysphagia  Patient Details Name: Natalie Morrison MRN: [id number] DOB: Jan 18, 1971 Today's Date: 02/10/2018 Time: 7026-3785 SLP Time Calculation (min) (ACUTE ONLY): 9 min  Assessment / Plan / Recommendation Clinical Impression  Pt continues to have coughing with ice chips and needs Mod cues for awareness, sustained attention to bolus preparation. She did cough to command x1 but this could not be replicated despite cueing. Recommend to remain NPO with temporary alternative means of nutrition. Will continue to follow for readiness to participate in instrumental testing pending signs of clinical improvement.   HPI HPI: 47 y/o female with severe CAP causing ARDS. ETT x3: 4/25- 5/2 (self-extubated), 5/2-5-4, 5/4-5/7. PMH: EtOH abuse, HTN, hepatitis A, depression, asthma, anxiety, anemia      SLP Plan  Continue with current plan of care       Recommendations  Diet recommendations: NPO Medication Administration: Via alternative means                Oral Care Recommendations: Oral care QID Follow up Recommendations: (tba) SLP Visit Diagnosis: Dysphagia, unspecified (R13.10) Plan: Continue with current plan of care       GO                Germain Osgood 02/10/2018, 10:50 AM  Germain Osgood, M.A. CCC-SLP [phone]

## 2018-02-11 LAB — GLUCOSE, CAPILLARY
Glucose-Capillary: 112 mg/dL — ABNORMAL HIGH (ref 65–99)
Glucose-Capillary: 115 mg/dL — ABNORMAL HIGH (ref 65–99)
Glucose-Capillary: 126 mg/dL — ABNORMAL HIGH (ref 65–99)
Glucose-Capillary: 126 mg/dL — ABNORMAL HIGH (ref 65–99)
Glucose-Capillary: 134 mg/dL — ABNORMAL HIGH (ref 65–99)
Glucose-Capillary: 146 mg/dL — ABNORMAL HIGH (ref 65–99)

## 2018-02-11 MED ORDER — LORAZEPAM 2 MG/ML IJ SOLN: 2.0000 mg | Freq: Once | INTRAMUSCULAR | Status: DC

## 2018-02-11 MED ORDER — METOPROLOL TARTRATE 25 MG PO TABS
25.0000 mg | ORAL_TABLET | Freq: Two times a day (BID) | ORAL | Status: DC
  Administered 2018-02-11 – 2018-02-14 (×6): 25 mg
  Filled 2018-02-11 (×6): qty 1

## 2018-02-11 MED ORDER — HALOPERIDOL LACTATE 5 MG/ML IJ SOLN: 2.0000 mg | INTRAMUSCULAR | Status: DC | PRN

## 2018-02-11 MED ORDER — QUETIAPINE FUMARATE 25 MG PO TABS
50.0000 mg | ORAL_TABLET | Freq: Two times a day (BID) | ORAL | Status: DC
  Administered 2018-02-11 – 2018-02-12 (×2): 50 mg
  Filled 2018-02-11 (×2): qty 2

## 2018-02-11 MED ORDER — SODIUM CHLORIDE 0.9 % IV SOLN: 250.0000 mL | INTRAVENOUS | Status: DC | PRN

## 2018-02-11 NOTE — Progress Notes (Signed)
[place] TEAM 1 - Stepdown/ICU TEAM  ARIADNE RISSMILLER  JJK:[id number] DOB: 1971-06-11 DOA: 01/27/2018 PCP: Eulas Post, MD    Brief Narrative:  47 y.o.femalew/ a hx of alcohol abuse, depression/anxiety, and asthma who presented to the ED w/ cough, shortness of breath, and cyanosis. Recently  treated with antibiotic as outpatient for bronchitis. Her husband found her to have markedlylabored breathing and a bluish-purple hue, prompting her presentation to the ED. After presentation she developed rapid respiratory failure, was transferred to the ICU, and intubated due to  pneumonia, ARDS, and alcohol withdrawal.   Significant Events: 4/25 admit - intubated 4/29 bronchoscopywith BAL 5/2 self extubated, reintubated  5/4 extubated, stridor, re-intubated 5/6 extubated   Subjective: Pt is sedate and does not awaken for her exam.  She is in no resp distress.  She can not provide a ROS due to her sedation.  There is no evidence of uncontrolled pain.    Assessment & Plan:  Sepsis secondary to PNA - ARDS - acute hypoxic respiratory failure Completed antibiotic treatment while in ICU - no evidence of ongoing infection   Stridor / laryngeal edema  Steroid course has been completed - unable to assess voice today due to sedation - SLP following   Alcohol dependence - EtOH withdrawal required precedex - wean sedatives as pt essentially obtunded at this time    DVT prophylaxis: lovenox  Code Status: FULL CODE Family Communication: spoke w/ family at bedside   Disposition Plan: SDU   Consultants:  PCCM  Antimicrobials:  Vancomycin 4/25 > 4/26 Doxycycline 4/25 >4/30 Rocephin 4/25 >4/30  Objective: Blood pressure 101/63, pulse 82, temperature 99.9 F (37.7 C), temperature source Oral, resp. rate (!) 21, height _0  (1.676 m), weight 55.3 kg (121 lb 14.6 oz), SpO2 97 %.  Intake/Output Summary (Last 24 hours) at 02/11/2018 1152 Last data filed at 02/11/2018 1000 Gross per 24  hour  Intake 2050 ml  Output 0 ml  Net 2050 ml   Filed Weights   02/09/18 0513 02/10/18 0500 02/11/18 0440  Weight: 60.8 kg (134 lb 0.6 oz) 57.6 kg (126 lb 15.8 oz) 55.3 kg (121 lb 14.6 oz)    Examination: General: No acute respiratory distress - obtunded  Lungs: Clear to auscultation bilaterally without wheezes or crackles Cardiovascular: Regular rate and rhythm without murmur gallop or rub normal S1 and S2 Abdomen: Nontender, nondistended, soft, bowel sounds positive, no rebound, no ascites, no appreciable mass Extremities: No significant cyanosis, clubbing, or edema bilateral lower extremities - little muscle mass legs   CBC: Recent Labs  Lab 02/06/18 0552 02/08/18 0345 02/09/18 0237  WBC 16.8* 20.8* 21.1*  HGB 9.8* 10.2* 10.2*  HCT 29.9* 31.4* 31.7*  MCV 104.9* 105.4* 105.3*  PLT 538* 763* 371*   Basic Metabolic Panel: Recent Labs  Lab 02/07/18 0941 02/08/18 0345 02/09/18 0237 02/10/18 0246  NA 137 138 142 142  K 5.2* 3.8 3.1* 3.8  CL 105 104 108 111  CO2 _1 GLUCOSE 177* 118* 101* 150*  BUN 21* 24* 29* 19  CREATININE 0.49 0.56 0.69 0.60  CALCIUM 9.0 9.0 9.0 9.2  MG 2.2 2.0 2.0  --   PHOS  --  4.7* 3.9  --    GFR: Estimated Creatinine Clearance: 75.1 mL/min (by C-G formula based on SCr of 0.6 mg/dL).  Liver Function Tests: No results for input(s): AST, ALT, ALKPHOS, BILITOT, PROT, ALBUMIN in the last 168 hours. No results for input(s): LIPASE, AMYLASE in  the last 168 hours. No results for input(s): AMMONIA in the last 168 hours.   CBG: Recent Labs  Lab 02/10/18 1923 02/10/18 2334 02/11/18 0352 02/11/18 0719 02/11/18 1127  GLUCAP 136* 137* 146* 112* 126*    Scheduled Meds: . bacitracin-polymyxin b   Both Eyes QID  . chlorhexidine  15 mL Mouth Rinse BID  . clonazepam  1 mg Oral BID  . docusate  100 mg Per Tube Daily  . enoxaparin (LOVENOX) injection  40 mg Subcutaneous Q24H  . folic acid  1 mg Per Tube Daily  . insulin aspart  0-9  Units Subcutaneous Q4H  . mouth rinse  15 mL Mouth Rinse q12n4p  . metoprolol tartrate  25 mg Oral BID  . nicotine  21 mg Transdermal Daily  . polyethylene glycol  17 g Per Tube Daily  . polyvinyl alcohol  1 drop Both Eyes Q4H  . QUEtiapine  200 mg Per Tube BID  . sennosides  5 mL Per Tube Daily  . sodium chloride flush  10-40 mL Intracatheter Q12H  . thiamine injection  100 mg Intravenous Daily     LOS: 15 days   Cherene Altes, MD [facility] Office  [phone] Pager - Text Page per Shea Evans as per below:  On-Call/Text Page:      [email or web address]      password [email or web address]  If 7PM-7AM, please contact night-coverage [email or web address] Password [email or web address] 02/11/2018, 11:52 AM

## 2018-02-11 NOTE — Progress Notes (Signed)
  Speech Language Pathology Treatment: Dysphagia  Patient Details Name: Natalie Morrison MRN: [id number] DOB: 1971/04/10 Today's Date: 02/11/2018 Time: 4696-2952 SLP Time Calculation (min) (ACUTE ONLY): 12 min  Assessment / Plan / Recommendation Clinical Impression  Pt is more lethargic this morning, although she did receive several sedating medication shortly before SLP arrival. She is making attempts to cough on command more consistently today given Mod-Max cues, but her cough is subjectively very weak and inefficient at clearing audible secretions, which seem increased from previous date. She also has anterior spillage of her saliva, which has not been observed by SLP before. Minimal ice chips were provided to attempt to facilitate clearance of secretions, but given persistent inability to clear, additional POs were held. RN made aware of current status - SLP will f/u early next week to allow for some time for improvement.   HPI HPI: 47 y/o female with severe CAP causing ARDS. ETT x3: 4/25- 5/2 (self-extubated), 5/2-5-4, 5/4-5/7. PMH: EtOH abuse, HTN, hepatitis A, depression, asthma, anxiety, anemia      SLP Plan  Continue with current plan of care       Recommendations  Diet recommendations: NPO Medication Administration: Via alternative means                Oral Care Recommendations: Oral care QID Follow up Recommendations: (tba) SLP Visit Diagnosis: Dysphagia, unspecified (R13.10) Plan: Continue with current plan of care       GO                Germain Osgood 02/11/2018, 10:42 AM  Germain Osgood, M.A. CCC-SLP [phone]

## 2018-02-12 LAB — CBC
HCT: 35.9 % — ABNORMAL LOW (ref 36.0–46.0)
Hemoglobin: 11.4 g/dL — ABNORMAL LOW (ref 12.0–15.0)
MCH: 34.1 pg — ABNORMAL HIGH (ref 26.0–34.0)
MCHC: 31.8 g/dL (ref 30.0–36.0)
MCV: 107.5 fL — ABNORMAL HIGH (ref 78.0–100.0)
Platelets: 835 10*3/uL — ABNORMAL HIGH (ref 150–400)
RBC: 3.34 MIL/uL — ABNORMAL LOW (ref 3.87–5.11)
RDW: 13.4 % (ref 11.5–15.5)
WBC: 22 10*3/uL — ABNORMAL HIGH (ref 4.0–10.5)

## 2018-02-12 LAB — COMPREHENSIVE METABOLIC PANEL
ALT: 20 U/L (ref 14–54)
AST: 21 U/L (ref 15–41)
Albumin: 2.8 g/dL — ABNORMAL LOW (ref 3.5–5.0)
Alkaline Phosphatase: 95 U/L (ref 38–126)
Anion gap: 10 (ref 5–15)
BUN: 18 mg/dL (ref 6–20)
CO2: 25 mmol/L (ref 22–32)
Calcium: 9.2 mg/dL (ref 8.9–10.3)
Chloride: 108 mmol/L (ref 101–111)
Creatinine, Ser: 0.58 mg/dL (ref 0.44–1.00)
GFR calc Af Amer: >60 mL/min (ref >60)
GFR calc non Af Amer: >60 mL/min (ref >60)
Glucose, Bld: 135 mg/dL — ABNORMAL HIGH (ref 65–99)
Potassium: 3.7 mmol/L (ref 3.5–5.1)
Sodium: 143 mmol/L (ref 135–145)
Total Bilirubin: 0.4 mg/dL (ref 0.3–1.2)
Total Protein: 6.2 g/dL — ABNORMAL LOW (ref 6.5–8.1)

## 2018-02-12 LAB — MAGNESIUM: Magnesium: 2 mg/dL (ref 1.7–2.4)

## 2018-02-12 LAB — GLUCOSE, CAPILLARY
Glucose-Capillary: 116 mg/dL — ABNORMAL HIGH (ref 65–99)
Glucose-Capillary: 124 mg/dL — ABNORMAL HIGH (ref 65–99)
Glucose-Capillary: 124 mg/dL — ABNORMAL HIGH (ref 65–99)
Glucose-Capillary: 131 mg/dL — ABNORMAL HIGH (ref 65–99)
Glucose-Capillary: 132 mg/dL — ABNORMAL HIGH (ref 65–99)
Glucose-Capillary: 135 mg/dL — ABNORMAL HIGH (ref 65–99)

## 2018-02-12 LAB — PHOSPHORUS: Phosphorus: 4.6 mg/dL (ref 2.5–4.6)

## 2018-02-12 MED ORDER — HALOPERIDOL LACTATE 5 MG/ML IJ SOLN: 2.0000 mg | Freq: Four times a day (QID) | INTRAMUSCULAR | Status: DC | PRN

## 2018-02-12 MED ORDER — QUETIAPINE FUMARATE 25 MG PO TABS
25.0000 mg | ORAL_TABLET | Freq: Two times a day (BID) | ORAL | Status: DC
  Administered 2018-02-12 – 2018-02-13 (×2): 25 mg
  Filled 2018-02-12 (×2): qty 1

## 2018-02-12 NOTE — Progress Notes (Signed)
[place] TEAM 1 - Stepdown/ICU TEAM  Natalie Morrison  HBZ:[id number] DOB: 08-Mar-1971 DOA: 01/27/2018 PCP: Eulas Post, MD    Brief Narrative:  47 y.o.femalew/ a hx of alcohol abuse, depression/anxiety, and asthma who presented to the ED w/ cough, shortness of breath, and cyanosis. Recently  treated with antibiotic as outpatient for bronchitis. Her husband found her to have markedlylabored breathing and a bluish-purple hue, prompting her presentation to the ED. After presentation she developed rapid respiratory failure, was transferred to the ICU, and intubated due to  pneumonia, ARDS, and alcohol withdrawal.   Significant Events: 4/25 admit - intubated 4/29 bronchoscopywith BAL 5/2 self extubated, reintubated  5/4 extubated, stridor, re-intubated 5/6 extubated   Subjective: Patient is much more alert today though most of her conversation is unintelligible babbling.  She does follow some simple commands.  She is quite agitated by her soft wrist restraints.  There is no evidence of respiratory distress.  She denies pain.  Assessment & Plan:  Sepsis secondary to PNA - ARDS - acute hypoxic respiratory failure Completed antibiotic treatment while in ICU - no evidence of ongoing infection   Stridor / laryngeal edema  Steroid course has been completed - unable to assess voice today due to sedation - SLP following   Alcohol dependence - EtOH withdrawal - acute encephalopathy required precedex - continue to wean sedatives - release restraints asap but clearly at this time she would immediately remove her NG and IV    DVT prophylaxis: lovenox  Code Status: FULL CODE Family Communication: spoke w/ husband at bedside   Disposition Plan: SDU   Consultants:  PCCM  Antimicrobials:  Vancomycin 4/25 > 4/26 Doxycycline 4/25 >4/30 Rocephin 4/25 >4/30  Objective: Blood pressure (!) 122/91, pulse 92, temperature 99.1 F (37.3 C), temperature source Axillary, resp. rate 19,  height _0  (1.676 m), weight 54.2 kg (119 lb 7.8 oz), SpO2 99 %.  Intake/Output Summary (Last 24 hours) at 02/12/2018 1436 Last data filed at 02/12/2018 1215 Gross per 24 hour  Intake 20 ml  Output 1250 ml  Net -1230 ml   Filed Weights   02/10/18 0500 02/11/18 0440 02/12/18 0434  Weight: 57.6 kg (126 lb 15.8 oz) 55.3 kg (121 lb 14.6 oz) 54.2 kg (119 lb 7.8 oz)    Examination: General: No acute respiratory distress - alert - confused  Lungs: Clear to auscultation bilaterally - fine crackles scattered th/o  Cardiovascular: Regular rate and rhythm without murmur  Abdomen: NT/ND, soft, bs+, no mass  Extremities: no C/C/E B LE   CBC: Recent Labs  Lab 02/08/18 0345 02/09/18 0237 02/12/18 0520  WBC 20.8* 21.1* 22.0*  HGB 10.2* 10.2* 11.4*  HCT 31.4* 31.7* 35.9*  MCV 105.4* 105.3* 107.5*  PLT 763* 928* 101*   Basic Metabolic Panel: Recent Labs  Lab 02/08/18 0345 02/09/18 0237 02/10/18 0246 02/12/18 0520  NA 138 142 142 143  K 3.8 3.1* 3.8 3.7  CL 104 108 111 108  CO2 _1 GLUCOSE 118* 101* 150* 135*  BUN 24* 29* 19 18  CREATININE 0.56 0.69 0.60 0.58  CALCIUM 9.0 9.0 9.2 9.2  MG 2.0 2.0  --  2.0  PHOS 4.7* 3.9  --  4.6   GFR: Estimated Creatinine Clearance: 73.6 mL/min (by C-G formula based on SCr of 0.58 mg/dL).  Liver Function Tests: Recent Labs  Lab 02/12/18 0520  AST 21  ALT 20  ALKPHOS 95  BILITOT 0.4  PROT 6.2*  ALBUMIN  2.8*    CBG: Recent Labs  Lab 02/11/18 2021 02/11/18 2348 02/12/18 0417 02/12/18 0731 02/12/18 1217  GLUCAP 126* 134* 135* 132* 116*    Scheduled Meds: . bacitracin-polymyxin b   Both Eyes QID  . chlorhexidine  15 mL Mouth Rinse BID  . docusate  100 mg Per Tube Daily  . enoxaparin (LOVENOX) injection  40 mg Subcutaneous Q24H  . folic acid  1 mg Per Tube Daily  . insulin aspart  0-9 Units Subcutaneous Q4H  . mouth rinse  15 mL Mouth Rinse q12n4p  . metoprolol tartrate  25 mg Per Tube BID  . nicotine  21 mg  Transdermal Daily  . polyethylene glycol  17 g Per Tube Daily  . polyvinyl alcohol  1 drop Both Eyes Q4H  . QUEtiapine  50 mg Per Tube BID  . sennosides  5 mL Per Tube Daily  . sodium chloride flush  10-40 mL Intracatheter Q12H     LOS: 16 days   Cherene Altes, MD [facility] Office  [phone] Pager - Text Page per Amion as per below:  On-Call/Text Page:      [email or web address]      password [email or web address]  If 7PM-7AM, please contact night-coverage [email or web address] Password [facility] 02/12/2018, 2:36 PM

## 2018-02-13 ENCOUNTER — Inpatient Hospital Stay (HOSPITAL_COMMUNITY): Payer: BLUE CROSS/BLUE SHIELD

## 2018-02-13 LAB — IRON AND TIBC
Iron: 53 ug/dL (ref 28–170)
Saturation Ratios: 21 % (ref 10.4–31.8)
TIBC: 253 ug/dL (ref 250–450)
UIBC: 200 ug/dL

## 2018-02-13 LAB — RETICULOCYTES
RBC.: 3.38 MIL/uL — ABNORMAL LOW (ref 3.87–5.11)
Retic Count, Absolute: 128.4 10*3/uL (ref 19.0–186.0)
Retic Ct Pct: 3.8 % — ABNORMAL HIGH (ref 0.4–3.1)

## 2018-02-13 LAB — GLUCOSE, CAPILLARY
Glucose-Capillary: 125 mg/dL — ABNORMAL HIGH (ref 65–99)
Glucose-Capillary: 137 mg/dL — ABNORMAL HIGH (ref 65–99)
Glucose-Capillary: 139 mg/dL — ABNORMAL HIGH (ref 65–99)
Glucose-Capillary: 125 mg/dL — ABNORMAL HIGH (ref 65–99)
Glucose-Capillary: 169 mg/dL — ABNORMAL HIGH (ref 65–99)
Glucose-Capillary: 138 mg/dL — ABNORMAL HIGH (ref 65–99)

## 2018-02-13 LAB — RPR: RPR Ser Ql: NONREACTIVE

## 2018-02-13 LAB — FOLATE: Folate: 34 ng/mL (ref >5.9)

## 2018-02-13 LAB — TSH: TSH: 3.742 u[IU]/mL (ref 0.350–4.500)

## 2018-02-13 LAB — FERRITIN: Ferritin: 334 ng/mL — ABNORMAL HIGH (ref 11–307)

## 2018-02-13 LAB — VITAMIN B12: Vitamin B-12: 1018 pg/mL — ABNORMAL HIGH (ref 180–914)

## 2018-02-13 LAB — AMMONIA: Ammonia: 18 umol/L (ref 9–35)

## 2018-02-13 MED ORDER — HALOPERIDOL LACTATE 5 MG/ML IJ SOLN
2.0000 mg | Freq: Three times a day (TID) | INTRAMUSCULAR | Status: DC | PRN
  Administered 2018-02-13: 2 mg via INTRAVENOUS
  Filled 2018-02-13: qty 1

## 2018-02-13 MED ORDER — QUETIAPINE FUMARATE 25 MG PO TABS
25.0000 mg | ORAL_TABLET | Freq: Every day | ORAL | Status: DC
  Administered 2018-02-13: 25 mg
  Filled 2018-02-13: qty 1

## 2018-02-13 NOTE — Progress Notes (Signed)
Pt to CT scan via bed and back by RN and transport on monitor.  Tolerated well.  Resting with eyes closed upon return arrival to floor.  Will continue to monitor and catch up on meds from being off unit.

## 2018-02-13 NOTE — Progress Notes (Signed)
[place] TEAM 1 - Stepdown/ICU TEAM  Natalie Morrison  VEL:[id number] DOB: 06-02-1971 DOA: 01/27/2018 PCP: Eulas Post, MD    Brief Narrative:  47 y.o.femalew/ a hx of alcohol abuse, depression/anxiety, and asthma who presented to the ED w/ cough, shortness of breath, and cyanosis. Recently treated with antibiotic as outpatient for bronchitis. Her husband found her to have markedlylabored breathing and a bluish-purple hue, prompting her presentation to the ED. After presentation she developed rapid respiratory failure, was transferred to the ICU, and intubated due to pneumonia, ARDS, and alcohol withdrawal.   Significant Events: 4/25 admit - intubated 4/29 bronchoscopywith BAL 5/2 self extubated, reintubated  5/4 extubated, stridor, re-intubated 5/6 extubated   Subjective: The patient is more awake today and remain significantly altered.  Her speech is opting garbled and incomprehensible.  She does not appear to be uncomfortable.  She denies uncontrolled pain.  She clearly does not like having the NG in place.  She is not able to be redirected verbally.  Assessment & Plan:  Sepsis secondary to PNA - ARDS - acute hypoxic respiratory failure Completed antibiotic treatment while in ICU - no evidence of ongoing infection   Stridor / laryngeal edema  Steroid course has been completed - unable to assess voice today due to sedation - SLP following   Alcohol dependence - EtOH withdrawal - acute encephalopathy required precedex - continue to wean sedatives as able while also attempting to stimulate sleep at night - release restraints asap but clearly at this time she would immediately remove her NG and IV - discontinue Ativan withdrawal protocol as patient is well beyond the window for ongoing withdrawal - check CT head for evidence of anoxic injury as patient would clearly not cooperate with an MRI at this time   DVT prophylaxis: lovenox  Code Status: FULL CODE Family  Communication: no family present at time of exam today   Disposition Plan: SDU   Consultants:  PCCM  Antimicrobials:  Vancomycin 4/25 > 4/26 Doxycycline 4/25 >4/30 Rocephin 4/25 >4/30  Objective: Blood pressure (!) 139/94, pulse 85, temperature 99.2 F (37.3 C), temperature source Axillary, resp. rate (!) 34, height _0  (1.676 m), weight 56.4 kg (124 lb 5.4 oz), SpO2 95 %.  Intake/Output Summary (Last 24 hours) at 02/13/2018 1231 Last data filed at 02/13/2018 0629 Gross per 24 hour  Intake 2262 ml  Output 951 ml  Net 1311 ml   Filed Weights   02/11/18 0440 02/12/18 0434 02/13/18 0335  Weight: 55.3 kg (121 lb 14.6 oz) 54.2 kg (119 lb 7.8 oz) 56.4 kg (124 lb 5.4 oz)    Examination: General: No acute respiratory distress - remains confused Lungs: CTA B without wheezing Cardiovascular: RRR without murmur Abdomen: NT/ND, soft, bs+, no mass  Extremities: no edema bilateral lower extremities   CBC: Recent Labs  Lab 02/08/18 0345 02/09/18 0237 02/12/18 0520  WBC 20.8* 21.1* 22.0*  HGB 10.2* 10.2* 11.4*  HCT 31.4* 31.7* 35.9*  MCV 105.4* 105.3* 107.5*  PLT 763* 928* 258*   Basic Metabolic Panel: Recent Labs  Lab 02/08/18 0345 02/09/18 0237 02/10/18 0246 02/12/18 0520  NA 138 142 142 143  K 3.8 3.1* 3.8 3.7  CL 104 108 111 108  CO2 _1 GLUCOSE 118* 101* 150* 135*  BUN 24* 29* 19 18  CREATININE 0.56 0.69 0.60 0.58  CALCIUM 9.0 9.0 9.2 9.2  MG 2.0 2.0  --  2.0  PHOS 4.7* 3.9  --  4.6  GFR: Estimated Creatinine Clearance: 76.6 mL/min (by C-G formula based on SCr of 0.58 mg/dL).  Liver Function Tests: Recent Labs  Lab 02/12/18 0520  AST 21  ALT 20  ALKPHOS 95  BILITOT 0.4  PROT 6.2*  ALBUMIN 2.8*    CBG: Recent Labs  Lab 02/12/18 1911 02/12/18 2347 02/13/18 0333 02/13/18 0741 02/13/18 1158  GLUCAP 124* 131* 125* 137* 139*    Scheduled Meds: . bacitracin-polymyxin b   Both Eyes QID  . chlorhexidine  15 mL Mouth Rinse BID  .  docusate  100 mg Per Tube Daily  . enoxaparin (LOVENOX) injection  40 mg Subcutaneous Q24H  . folic acid  1 mg Per Tube Daily  . insulin aspart  0-9 Units Subcutaneous Q4H  . mouth rinse  15 mL Mouth Rinse q12n4p  . metoprolol tartrate  25 mg Per Tube BID  . nicotine  21 mg Transdermal Daily  . polyethylene glycol  17 g Per Tube Daily  . polyvinyl alcohol  1 drop Both Eyes Q4H  . QUEtiapine  25 mg Per Tube BID  . sennosides  5 mL Per Tube Daily  . sodium chloride flush  10-40 mL Intracatheter Q12H     LOS: 17 days   Cherene Altes, MD [facility] Office  [phone] Pager - Text Page per Amion as per below:  On-Call/Text Page:      [email or web address]      password [email or web address]  If 7PM-7AM, please contact night-coverage [email or web address] Password [facility] 02/13/2018, 12:31 PM

## 2018-02-13 NOTE — Plan of Care (Signed)
Discussed plan of care with pt.  Stressed my concern over patient's safety.  Patient is agitated and continues to try to get out of bed.  No teach-back displayed.

## 2018-02-13 NOTE — Progress Notes (Signed)
Talked with family asking about children, etc.  Husband states that when they were considering having them, he had 2 MI's and she developed asthma because she worked in a building with asbestos and developed asthma.

## 2018-02-13 NOTE — Progress Notes (Signed)
RN assumed care of pt at 2300. Pt initially appeared to be sleeping, however was easily awoken for vital signs to be checked. Pt has had a difficult time getting to sleep and staying asleep. Pt is alert and oriented to self, otherwise has incomprehensible/inappropriate speech. Pt is in bilateral wrist restraints for her safety as she tries to pull at her cortrak and IV lines. Pt has a [employer] cabin crew at bedside. Pt has been encouraged to try to get some sleep as she has not slept in at least 1 day, otherwise pt is in NAD at this time. Will continue to monitor. Clint Bolder, RN 02/13/18 12:37 AM

## 2018-02-14 ENCOUNTER — Inpatient Hospital Stay (HOSPITAL_COMMUNITY): Payer: BLUE CROSS/BLUE SHIELD

## 2018-02-14 LAB — CBC
HCT: 37.1 % (ref 36.0–46.0)
Hemoglobin: 11.8 g/dL — ABNORMAL LOW (ref 12.0–15.0)
MCH: 34 pg (ref 26.0–34.0)
MCHC: 31.8 g/dL (ref 30.0–36.0)
MCV: 106.9 fL — ABNORMAL HIGH (ref 78.0–100.0)
Platelets: 809 10*3/uL — ABNORMAL HIGH (ref 150–400)
RBC: 3.47 MIL/uL — ABNORMAL LOW (ref 3.87–5.11)
RDW: 13.7 % (ref 11.5–15.5)
WBC: 21 10*3/uL — ABNORMAL HIGH (ref 4.0–10.5)

## 2018-02-14 LAB — COMPREHENSIVE METABOLIC PANEL
ALT: 19 U/L (ref 14–54)
AST: 18 U/L (ref 15–41)
Albumin: 3.1 g/dL — ABNORMAL LOW (ref 3.5–5.0)
Alkaline Phosphatase: 86 U/L (ref 38–126)
Anion gap: 9 (ref 5–15)
BUN: 24 mg/dL — ABNORMAL HIGH (ref 6–20)
CO2: 23 mmol/L (ref 22–32)
Calcium: 9.5 mg/dL (ref 8.9–10.3)
Chloride: 109 mmol/L (ref 101–111)
Creatinine, Ser: 0.58 mg/dL (ref 0.44–1.00)
GFR calc Af Amer: >60 mL/min (ref >60)
GFR calc non Af Amer: >60 mL/min (ref >60)
Glucose, Bld: 131 mg/dL — ABNORMAL HIGH (ref 65–99)
Potassium: 3.8 mmol/L (ref 3.5–5.1)
Sodium: 141 mmol/L (ref 135–145)
Total Bilirubin: 0.3 mg/dL (ref 0.3–1.2)
Total Protein: 6.5 g/dL (ref 6.5–8.1)

## 2018-02-14 LAB — GLUCOSE, CAPILLARY
Glucose-Capillary: 135 mg/dL — ABNORMAL HIGH (ref 65–99)
Glucose-Capillary: 145 mg/dL — ABNORMAL HIGH (ref 65–99)
Glucose-Capillary: 119 mg/dL — ABNORMAL HIGH (ref 65–99)

## 2018-02-14 MED ORDER — POLYETHYLENE GLYCOL 3350 17 G PO PACK
17.0000 g | PACK | Freq: Every day | ORAL | Status: DC
  Filled 2018-02-14: qty 1

## 2018-02-14 MED ORDER — QUETIAPINE FUMARATE 25 MG PO TABS
12.5000 mg | ORAL_TABLET | Freq: Every day | ORAL | Status: AC
  Administered 2018-02-14 – 2018-02-15 (×2): 12.5 mg via ORAL
  Filled 2018-02-14 (×2): qty 1

## 2018-02-14 MED ORDER — ACETAMINOPHEN 325 MG PO TABS: 650.0000 mg | ORAL_TABLET | Freq: Four times a day (QID) | ORAL | Status: DC | PRN

## 2018-02-14 MED ORDER — FOLIC ACID 1 MG PO TABS
1.0000 mg | ORAL_TABLET | Freq: Every day | ORAL | Status: DC
  Administered 2018-02-15 – 2018-02-16 (×2): 1 mg via ORAL
  Filled 2018-02-14 (×2): qty 1

## 2018-02-14 MED ORDER — QUETIAPINE FUMARATE 25 MG PO TABS: 25.0000 mg | ORAL_TABLET | Freq: Every day | ORAL | Status: DC

## 2018-02-14 MED ORDER — DOCUSATE SODIUM 50 MG/5ML PO LIQD
100.0000 mg | Freq: Every day | ORAL | Status: DC
  Filled 2018-02-14 (×2): qty 10

## 2018-02-14 MED ORDER — SENNOSIDES 8.8 MG/5ML PO SYRP
5.0000 mL | ORAL_SOLUTION | Freq: Every day | ORAL | Status: DC
  Filled 2018-02-14 (×2): qty 5

## 2018-02-14 MED ORDER — METOPROLOL TARTRATE 25 MG PO TABS
25.0000 mg | ORAL_TABLET | Freq: Two times a day (BID) | ORAL | Status: DC
  Administered 2018-02-14 – 2018-02-16 (×4): 25 mg via ORAL
  Filled 2018-02-14 (×4): qty 1

## 2018-02-14 MED ORDER — BETHANECHOL CHLORIDE 10 MG PO TABS
10.0000 mg | ORAL_TABLET | Freq: Three times a day (TID) | ORAL | Status: DC
  Administered 2018-02-14 – 2018-02-15 (×3): 10 mg via ORAL
  Filled 2018-02-14 (×5): qty 1

## 2018-02-14 MED ORDER — RESOURCE THICKENUP CLEAR PO POWD
ORAL | Status: DC | PRN
  Filled 2018-02-14: qty 125

## 2018-02-14 NOTE — Care Management Note (Addendum)
Case Management Note  Patient Details  Name: AVIRA TILLISON MRN: [id number] Date of Birth: 1970/10/14  Subjective/Objective:  From home with spouse, Acute Resp Failure, sepsis secondary to pan, HR down in 40';s today, asymptomatic, ARDS, passed MBS, started on dysp 2 diet, ct of head negative for anoxic injury, restraints dc'd, sitter still in room, wbc 21. For MRI if mental status does not improve.   5/15 Tomi Bamberger RN, BSN - per pt eval rec CIR , CIR consulted and recommended..                 Action/Plan: NCM will follow for dc needs.  Expected Discharge Date:                  Expected Discharge Plan:  [place]  In-House Referral:     Discharge planning Services  CM Consult  Post Acute Care Choice:    Choice offered to:     DME Arranged:    DME Agency:     HH Arranged:    [place] Agency:     Status of Service:  In process, will continue to follow  If discussed at Long Length of Stay Meetings, dates discussed:    Additional Comments:  Zenon Mayo, RN 02/14/2018, 4:51 PM

## 2018-02-14 NOTE — Progress Notes (Signed)
Pt HR dropping in the 40's nonsustained while sleeping. Notified physician and charge RN. Pt is asymptomatic.

## 2018-02-14 NOTE — Progress Notes (Signed)
[place] TEAM 1 - Stepdown/ICU TEAM  Natalie Morrison  SNK:[id number] DOB: 02-08-1971 DOA: 01/27/2018 PCP: Eulas Post, MD    Brief Narrative:  47 y.o.femalew/ a hx of alcohol abuse, depression/anxiety, and asthma who presented to the ED w/ cough, shortness of breath, and cyanosis. Recently treated with antibiotic as outpatient for bronchitis. Her husband found her to have markedlylabored breathing and a bluish-purple hue, prompting her presentation to the ED. After presentation she developed rapid respiratory failure, was transferred to the ICU, and intubated due to pneumonia, ARDS, and alcohol withdrawal.   Significant Events: 4/25 admit - intubated 4/29 bronchoscopywith BAL 5/2 self extubated, reintubated  5/4 extubated, stridor, re-intubated 5/6 extubated   Subjective: Patient is awake but mental status remains essentially without change since yesterday.  She denies shortness of breath nausea vomiting or chest pain.  Assessment & Plan:  Sepsis secondary to PNA - ARDS - acute hypoxic respiratory failure Completed antibiotic treatment while in ICU - no evidence of ongoing infection - resp status has stabilized - now on RA  Stridor / laryngeal edema  Steroid course has been completed - unable to assess voice today due to sedation - SLP following - for MBS today to determine if diet can be advanced   Acute delirium / acute encephalopathy - Alcohol dependence - EtOH withdrawal - acute encephalopathy required precedex - avoid sedatives as able while also attempting to stimulate sleep at night - release restraints asap but clearly at this time she would immediately remove her NG and IV - CT head w/o evidence of anoxic injury but did note advanced atrophy likely due to excessive EtOH consumption - consider MRI brain if mental status not improved by Wednesday  DVT prophylaxis: lovenox  Code Status: FULL CODE Family Communication: Spoke with husband at bedside  Disposition Plan:  SDU   Consultants:  PCCM  Antimicrobials:  Vancomycin 4/25 > 4/26 Doxycycline 4/25 >4/30 Rocephin 4/25 >4/30  Objective: Blood pressure 122/76, pulse 69, temperature 98.7 F (37.1 C), temperature source Oral, resp. rate (!) 22, height _0  (1.676 m), weight 56.4 kg (124 lb 5.4 oz), SpO2 100 %.  Intake/Output Summary (Last 24 hours) at 02/14/2018 1150 Last data filed at 02/14/2018 0905 Gross per 24 hour  Intake 20 ml  Output 1075 ml  Net -1055 ml   Filed Weights   02/12/18 0434 02/13/18 0335 02/14/18 0302  Weight: 54.2 kg (119 lb 7.8 oz) 56.4 kg (124 lb 5.4 oz) 56.4 kg (124 lb 5.4 oz)    Examination: General: No acute respiratory distress Lungs: CTA B without wheezing or crackles  Cardiovascular: RRR Abdomen: NT/ND, soft, BS+, no mass  Extremities: no signif edema B LE   CBC: Recent Labs  Lab 02/09/18 0237 02/12/18 0520 02/14/18 0500  WBC 21.1* 22.0* 21.0*  HGB 10.2* 11.4* 11.8*  HCT 31.7* 35.9* 37.1  MCV 105.3* 107.5* 106.9*  PLT 928* 835* 937*   Basic Metabolic Panel: Recent Labs  Lab 02/08/18 0345 02/09/18 0237 02/10/18 0246 02/12/18 0520 02/14/18 0500  NA 138 142 142 143 141  K 3.8 3.1* 3.8 3.7 3.8  CL 104 108 111 108 109  CO2 _1 GLUCOSE 118* 101* 150* 135* 131*  BUN 24* 29* 19 18 24*  CREATININE 0.56 0.69 0.60 0.58 0.58  CALCIUM 9.0 9.0 9.2 9.2 9.5  MG 2.0 2.0  --  2.0  --   PHOS 4.7* 3.9  --  4.6  --    GFR:  Estimated Creatinine Clearance: 76.6 mL/min (by C-G formula based on SCr of 0.58 mg/dL).  Liver Function Tests: Recent Labs  Lab 02/12/18 0520 02/14/18 0500  AST 21 18  ALT 20 19  ALKPHOS 95 86  BILITOT 0.4 0.3  PROT 6.2* 6.5  ALBUMIN 2.8* 3.1*    CBG: Recent Labs  Lab 02/13/18 1158 02/13/18 1618 02/13/18 1937 02/13/18 2310 02/14/18 0358  GLUCAP 139* 125* 169* 138* 135*    Scheduled Meds: . bacitracin-polymyxin b   Both Eyes QID  . chlorhexidine  15 mL Mouth Rinse BID  . docusate  100 mg Per Tube  Daily  . enoxaparin (LOVENOX) injection  40 mg Subcutaneous Q24H  . folic acid  1 mg Per Tube Daily  . insulin aspart  0-9 Units Subcutaneous Q4H  . mouth rinse  15 mL Mouth Rinse q12n4p  . metoprolol tartrate  25 mg Per Tube BID  . nicotine  21 mg Transdermal Daily  . polyethylene glycol  17 g Per Tube Daily  . polyvinyl alcohol  1 drop Both Eyes Q4H  . QUEtiapine  25 mg Per Tube QHS  . sennosides  5 mL Per Tube Daily  . sodium chloride flush  10-40 mL Intracatheter Q12H     LOS: 18 days   Cherene Altes, MD [facility] Office  [phone] Pager - Text Page per Amion as per below:  On-Call/Text Page:      [email or web address]      password [email or web address]  If 7PM-7AM, please contact night-coverage [email or web address] Password [email or web address] 02/14/2018, 11:50 AM

## 2018-02-14 NOTE — Plan of Care (Signed)
Discussed plan of care with patient.  Patient has improved tremendously since last night.  Patient no longer requires restraints and is very cooperative with staff.  Patient transferred to bedside commode with the assistance of the nurse tech.  Nasogastric tube has been removed.  This nurse explained the importance of eating slowing since this was her first day eating soft food.  Patient said she understood.  Patient clearly expressed her desire to go home soon.

## 2018-02-14 NOTE — Progress Notes (Signed)
  Speech Language Pathology Treatment: Dysphagia  Patient Details Name: Natalie Morrison MRN: [id number] DOB: 07/15/1971 Today's Date: 02/14/2018 Time: 1093-2355 SLP Time Calculation (min) (ACUTE ONLY): 13 min  Assessment / Plan / Recommendation Clinical Impression  Pt remains dysphonic but is able to produce phonation today. She coughs consistently although weakly to command, but her spontaneous cough seems to have increased force. At baseline she sounds wet, but after providing oral care and a few pieces of ice to facilitate clearance of suspected pharyngeal/laryngeal secretions, her voice sounds clearer and she has no further coughing with ice chips and spoonfuls of water. Cup sips elicit a spontaneous cough reaction. Given clinical improvements, recommend proceeding with instrumental testing. MBS can be completed this afternoon. Would continue to provide small, single ice chips after oral care to facilitate secretion management, moistening of the oropharyngeal mucosa, and use of the swallowing musculature. Would remain NPO other than ice pending results of MBS.    HPI HPI: 47 y/o female with severe CAP causing ARDS. ETT x3: 4/25- 5/2 (self-extubated), 5/2-5-4, 5/4-5/7. PMH: EtOH abuse, HTN, hepatitis A, depression, asthma, anxiety, anemia      SLP Plan  MBS       Recommendations  Diet recommendations: NPO;Other(comment)(ice chips after oral care) Medication Administration: Via alternative means                Oral Care Recommendations: Oral care QID Follow up Recommendations: (tba) SLP Visit Diagnosis: Dysphagia, unspecified (R13.10) Plan: MBS       GO                Germain Osgood 02/14/2018, 9:09 AM  Germain Osgood, M.A. CCC-SLP [phone]

## 2018-02-14 NOTE — Progress Notes (Addendum)
Nutrition Follow-up  DOCUMENTATION CODES:   Not applicable  INTERVENTION:    Magic cup TID with meals, each supplement provides 290 kcal and 9 grams of protein  NEW NUTRITION DIAGNOSIS:   Predicted suboptimal nutrient intake related to dysphagia as evidenced by (MBSS report), ongoing  GOAL:   Patient will meet greater than or equal to 90% of their needs, currently unmet  MONITOR:   TF tolerance, PO intake, Labs, Skin, Weight trends, I & O's  ASSESSMENT:   47 y.o. F admitted on 01/27/18 for acute respiratory failure with hypoxia with subsequent sepsis from pneumonia - possible ARDS. PMH of alcohol abuse, depression/anxeity, asthma.  5/6 extubated 5/8 10 F feeding tube placed per IR  Pt s/p bedside swallow evaluation 5/8. SLP rec NPO status. Jevity 1.2 formula currently infusing at goal rate of 60 ml/hr via small bore feeding tube. Spoke with RN. Pt tolerating TF well. Plan is for MBSS today.  Medications include folvite & colace.  Labs reviewed. BUN 24 (H). CBG's [id number].  Addendum:  MBSS completed this afternoon.  Advanced to Dys 2-honey thick liquid diet 1342.  TF discontinued.  Diet Order:   Diet Order           DIET DYS 2 Room service appropriate? Yes; Fluid consistency: Honey Thick  Diet effective now         EDUCATION NEEDS:   Not appropriate for education at this time  Skin:  Skin Assessment: Reviewed RN Assessment  Last BM:  5/13   Intake/Output Summary (Last 24 hours) at 02/14/2018 1445 Last data filed at 02/14/2018 0905 Gross per 24 hour  Intake 20 ml  Output 1075 ml  Net -1055 ml   Height:   Ht Readings from Last 1 Encounters:  02/04/18 5\' 6"  (1.676 m)   Weight:   Wt Readings from Last 1 Encounters:  02/14/18 124 lb 5.4 oz (56.4 kg)   Ideal Body Weight:  59.09 kg  BMI:  Body mass index is 20.07 kg/m.  Estimated Nutritional Needs:   Kcal:  1600-1800  Protein:  80-90 gm  Fluid:  1.6-1.8 L  Arthur Holms, RD, LDN Pager  #: [phone] After-Hours Pager #: [phone]

## 2018-02-14 NOTE — Progress Notes (Signed)
Modified Barium Swallow Progress Note  Patient Details  Name: Natalie Morrison MRN: [id number] Date of Birth: May 10, 1971  Today's Date: 02/14/2018  Modified Barium Swallow completed.  Full report located under Chart Review in the Imaging Section.  Brief recommendations include the following:  Clinical Impression  Pt has a mild-moderate oropharyngeal dysphagia due to mental status and multiple, prolonged intubations including one self-extubation. Oral phase is appropriate with purees and liquids but mastication is prolonged with small bites of solids. She does not achieve complete airway closure, suspect due to irritation from ETT, which allows for all liquid consistencies to enter the airway during the swallow. Thin and nectar thick liquids are silently aspirated. Intermittent coughing during the study did not appear to correlate with aspiration to make it an adequate assessment tool clinically, although her cough was intermittently productive of what looked like secretions mixed with barium. Compensatory strategies did not consistently increase airway protection with these consistencies. Only trace amounts of honey thick liquids enter the laryngeal vestibule whether administered by cup or straw, and they are spontaneously cleared upon completion of the swallow. Recommend initiation of Dys 2 diet and honey thick liquids with full supervision due to current cognitive status. SLP will f/u for tolerance and potential to advance with additional time post-extubation.   Swallow Evaluation Recommendations       SLP Diet Recommendations: Dysphagia 2 (Fine chop) solids;Honey thick liquids   Liquid Administration via: Cup;Straw   Medication Administration: Whole meds with puree   Supervision: Full supervision/cueing for compensatory strategies;Staff to assist with self feeding   Compensations: Slow rate;Small sips/bites   Postural Changes: Seated upright at 90 degrees   Oral Care Recommendations:  Oral care BID   Other Recommendations: Order thickener from pharmacy;Prohibited food (jello, ice cream, thin soups);Remove water pitcher    Germain Osgood 02/14/2018,2:34 PM   Germain Osgood, M.A. CCC-SLP [phone]

## 2018-02-15 DIAGNOSIS — G92 Toxic encephalopathy: Secondary | ICD-10-CM

## 2018-02-15 DIAGNOSIS — R5381 Other malaise: Secondary | ICD-10-CM

## 2018-02-15 LAB — URINALYSIS, ROUTINE W REFLEX MICROSCOPIC
Bilirubin Urine: NEGATIVE
Glucose, UA: NEGATIVE mg/dL
Hgb urine dipstick: NEGATIVE
Ketones, ur: NEGATIVE mg/dL
Nitrite: NEGATIVE
Protein, ur: NEGATIVE mg/dL
Specific Gravity, Urine: 1.028 (ref 1.005–1.030)
WBC, UA: >50 WBC/hpf — ABNORMAL HIGH (ref 0–5)
pH: 5 (ref 5.0–8.0)

## 2018-02-15 LAB — GLUCOSE, CAPILLARY
Glucose-Capillary: 104 mg/dL — ABNORMAL HIGH (ref 65–99)
Glucose-Capillary: 116 mg/dL — ABNORMAL HIGH (ref 65–99)
Glucose-Capillary: 121 mg/dL — ABNORMAL HIGH (ref 65–99)
Glucose-Capillary: 124 mg/dL — ABNORMAL HIGH (ref 65–99)
Glucose-Capillary: 129 mg/dL — ABNORMAL HIGH (ref 65–99)
Glucose-Capillary: 131 mg/dL — ABNORMAL HIGH (ref 65–99)
Glucose-Capillary: 136 mg/dL — ABNORMAL HIGH (ref 65–99)
Glucose-Capillary: 172 mg/dL — ABNORMAL HIGH (ref 65–99)

## 2018-02-15 MED ORDER — NICOTINE 14 MG/24HR TD PT24
14.0000 mg | MEDICATED_PATCH | Freq: Every day | TRANSDERMAL | Status: DC
  Administered 2018-02-16: 14 mg via TRANSDERMAL
  Filled 2018-02-15: qty 1

## 2018-02-15 NOTE — Progress Notes (Signed)
[facility] TEAM 1 - Stepdown/ICU TEAM  Natalie Morrison  DEY:[id number] DOB: [birth year] DOA: 01/27/2018 PCP: Eulas Post, MD    Brief Narrative:  47 y.o.femalew/ a hx of alcohol abuse, depression/anxiety, and asthma who presented to the ED w/ cough, shortness of breath, and cyanosis. Recently treated with antibiotic as outpatient for bronchitis. Her husband found her to have markedlylabored breathing and a bluish-purple hue, prompting her presentation to the ED. After presentation she developed rapid respiratory failure, was transferred to the ICU, and intubated due to pneumonia, ARDS, and alcohol withdrawal.   Significant Events: 4/25 admit - intubated 4/29 bronchoscopywith BAL 5/2 self extubated, reintubated  5/4 extubated, stridor, re-intubated 5/6 extubated   Subjective: Mental status appears much improved today.  The patient is now out of restraints.  Her voice is stronger and she answers most questions appropriately.  She denies shortness of breath chest pain nausea or vomiting.  She denies dysuria.  She denies abdominal pain.  Assessment & Plan:  Sepsis secondary to PNA - ARDS - acute hypoxic respiratory failure Completed antibiotic treatment while in ICU - no evidence of ongoing infection - resp status has stabilized - now on RA  Stridor / laryngeal edema  Steroid course has been completed - SLP following and pt now on a diet which she is tolerating well    Acute delirium / acute encephalopathy - Alcohol dependence - EtOH withdrawal - acute encephalopathy required precedex - avoid sedatives as able while also attempting to stimulate sleep at night - release restraints - CT head w/o evidence of anoxic injury but did note advanced atrophy likely due to excessive EtOH consumption - now appears to be making significant improvement   +UA Pt has no sx and is not febrile - if her mental status was not improving I would be inclined to start abx, but given her lack of other sx,  and current improvement in her mental status I will follow for now and await results of her urine culture   DVT prophylaxis: lovenox  Code Status: FULL CODE Family Communication: No family present at time of exam today  Disposition Plan: Telemetry bed - PTOT - continue to follow mental status - CIR evaluation  Consultants:  PCCM  Antimicrobials:  Vancomycin 4/25 > 4/26 Doxycycline 4/25 >4/30 Rocephin 4/25 >4/30  Objective: Blood pressure 110/74, pulse 62, temperature 98.3 F (36.8 C), temperature source Oral, resp. rate (!) 21, height _0  (1.676 m), weight 55.3 kg (121 lb 14.6 oz), SpO2 96 %.  Intake/Output Summary (Last 24 hours) at 02/15/2018 1442 Last data filed at 02/15/2018 1033 Gross per 24 hour  Intake 1912 ml  Output 425 ml  Net 1487 ml   Filed Weights   02/13/18 0335 02/14/18 0302 02/15/18 0422  Weight: 56.4 kg (124 lb 5.4 oz) 56.4 kg (124 lb 5.4 oz) 55.3 kg (121 lb 14.6 oz)    Examination: General: No acute respiratory distress - more alert  Lungs: CTA B - no wheezing or crackles   Cardiovascular: RRR w/o M or rub  Abdomen: NT/ND, soft, BS+, no mass  Extremities: no edema B LE   CBC: Recent Labs  Lab 02/09/18 0237 02/12/18 0520 02/14/18 0500  WBC 21.1* 22.0* 21.0*  HGB 10.2* 11.4* 11.8*  HCT 31.7* 35.9* 37.1  MCV 105.3* 107.5* 106.9*  PLT 928* 835* 314*   Basic Metabolic Panel: Recent Labs  Lab 02/09/18 0237 02/10/18 0246 02/12/18 0520 02/14/18 0500  NA 142 142 143 141  K 3.1*  3.8 3.7 3.8  CL 108 111 108 109  CO2 _0 GLUCOSE 101* 150* 135* 131*  BUN 29* 19 18 24*  CREATININE 0.69 0.60 0.58 0.58  CALCIUM 9.0 9.2 9.2 9.5  MG 2.0  --  2.0  --   PHOS 3.9  --  4.6  --    GFR: Estimated Creatinine Clearance: 75.1 mL/min (by C-G formula based on SCr of 0.58 mg/dL).  Liver Function Tests: Recent Labs  Lab 02/12/18 0520 02/14/18 0500  AST 21 18  ALT 20 19  ALKPHOS 95 86  BILITOT 0.4 0.3  PROT 6.2* 6.5  ALBUMIN 2.8* 3.1*     CBG: Recent Labs  Lab 02/14/18 2003 02/15/18 0013 02/15/18 0411 02/15/18 0737 02/15/18 1146  GLUCAP 119* 131* 124* 129* 172*    Scheduled Meds: . bacitracin-polymyxin b   Both Eyes QID  . bethanechol  10 mg Oral TID  . chlorhexidine  15 mL Mouth Rinse BID  . docusate  100 mg Oral Daily  . enoxaparin (LOVENOX) injection  40 mg Subcutaneous Q24H  . folic acid  1 mg Oral Daily  . insulin aspart  0-9 Units Subcutaneous Q4H  . mouth rinse  15 mL Mouth Rinse q12n4p  . metoprolol tartrate  25 mg Oral BID  . nicotine  21 mg Transdermal Daily  . polyethylene glycol  17 g Oral Daily  . polyvinyl alcohol  1 drop Both Eyes Q4H  . QUEtiapine  12.5 mg Oral QHS  . sennosides  5 mL Oral Daily  . sodium chloride flush  10-40 mL Intracatheter Q12H     LOS: 19 days   Cherene Altes, MD [facility] Office  [phone] Pager - Text Page per Amion as per below:  On-Call/Text Page:      [email or web address]      password [email or web address]  If 7PM-7AM, please contact night-coverage [email or web address] Password [email or web address] 02/15/2018, 2:42 PM

## 2018-02-15 NOTE — Consult Note (Signed)
Physical Medicine and Rehabilitation Consult Reason for Consult: Decreased functional mobility related to respiratory failure Referring Physician: [facility]   HPI: Natalie Morrison is a 47 y.o. right-handed female with history significant for alcohol abuse, hypertension, COPD/asthma with tobacco abuse.  Per chart review patient lives with spouse.  One level home with 5 steps to entry.  She did use assistive device prior to admission for household ambulation.  Husband does the shopping cooking and cleaning.  Husband cannot provide 24-hour care.  Presented 01/27/2018 with increasing cough as well as shortness of breath with cyanosis.  Patient had recently been treated with Augmentin in the outpatient setting.  In the ED oxygen saturations 51% as well as tachycardia 120s, blood pressure 92/63.  Chest x-ray showed diffuse interstitial and alveolar airspace opacities compatible with bronchial pneumonia.  Noted potassium 2.8 as well as leukocytosis 30,000 with lactic acid 2.13.  Patient did require intubation and later extubated 02/07/2018.  Maintain on intravenous Solu-Medrol.  Hospital course complicated by acute delirium suspect alcohol withdrawal requiring Precedex and currently maintained on Seroquel.  Cranial CT scan showed no acute changes.  Currently maintained on a dysphagia #2 honey thick liquid diet.  Subcutaneous Lovenox for DVT prophylaxis.  Patient was slow titration of her oxygen needs.  Physical and occupational therapy evaluations completed 02/15/2018 with recommendations of physical medicine rehab consult   Review of Systems  Constitutional: Positive for malaise/fatigue. Negative for chills and fever.  HENT: Negative for hearing loss.   Eyes: Negative for blurred vision.  Respiratory: Positive for cough, sputum production, shortness of breath and wheezing.   Cardiovascular: Positive for leg swelling.  Gastrointestinal: Positive for constipation. Negative for nausea and vomiting.    Genitourinary: Negative for dysuria, flank pain and hematuria.  Musculoskeletal: Positive for joint pain and myalgias.  Skin: Negative for rash.  Psychiatric/Behavioral: Positive for depression.       Anxiety  All other systems reviewed and are negative.  Past Medical History:  Diagnosis Date  . Alcohol abuse   . Anemia   . Anxiety   . Anxiety and depression   . Asthma   . Depression   . Hepatitis A    "when I was a kid"  . Hypertension    Past Surgical History:  Procedure Laterality Date  . BREAST LUMPECTOMY Right   . TONSILLECTOMY AND ADENOIDECTOMY Bilateral over 30 years ago   Family History  Problem Relation Age of Onset  . Heart disease Mother   . Liver disease Mother   . Heart disease Father    Social History:  reports that she has been smoking cigarettes.  She has a 16.50 pack-year smoking history. She has never used smokeless tobacco. She reports that she drinks about 4.2 oz of alcohol per week. She reports that she has current or past drug history. Drug: Marijuana. Frequency: 1.00 time per week. Allergies:  Allergies  Allergen Reactions  . Nitrofurantoin Hives  . Other Other (See Comments)    Allergies mold, dust per allergy test  . Sulfonamide Derivatives Other (See Comments)    Unknown allergic reaction per husband    Medications Prior to Admission  Medication Sig Dispense Refill  . albuterol (PROVENTIL,VENTOLIN) 90 MCG/ACT inhaler Inhale 2 puffs into the lungs every 6 (six) hours as needed for wheezing or shortness of breath. Reported on 01/02/2016    . amoxicillin-clavulanate (AUGMENTIN) 875-125 MG tablet Take 1 tablet by mouth 2 (two) times daily. 20 tablet 0  . cetirizine (ZYRTEC)  10 MG chewable tablet Chew 10 mg by mouth at bedtime.     . fluticasone (FLONASE) 50 MCG/ACT nasal spray Place 1 spray into both nostrils daily.    . furosemide (LASIX) 20 MG tablet TAKE 1 TABLET BY MOUTH AS NEEDED FOR SWELLING. 20 tablet 3  . GLUCOSAMINE-CHONDROITIN PO Take 1  tablet by mouth daily.    . Multiple Vitamin (MULTIVITAMIN WITH MINERALS) TABS tablet Take 1 tablet by mouth daily. One a Day Women's    . vitamin B-12 1000 MCG tablet Take 1 tablet (1,000 mcg total) by mouth daily. 30 tablet 0    Home: Home Living Family/patient expects to be discharged to:: Private residence Living Arrangements: Spouse/significant other Available Help at Discharge: Family, Available PRN/intermittently Type of Home: House Home Access: Stairs to enter Technical brewer of Steps: 5 Entrance Stairs-Rails: Can reach both Home Layout: Multi-level(split level) Alternate Level Stairs-Number of Steps: 7/2 Bathroom Shower/Tub: Public librarian, Architectural technologist: Standard Bathroom Accessibility: Yes Home Equipment: [employer] - single point, Grab bars - tub/shower, Hand held shower head, Environmental consultant - 2 wheels  Functional History: Prior Function Level of Independence: Needs assistance Gait / Transfers Assistance Needed: household ambulation  ADL's / Homemaking Assistance Needed: husband does shopping, cooking cleaning; pt states she drove adn is "in between jobs" Functional Status:  Mobility: [place] bed mobility: [place] bed mobility comments: increased time and use of bedrails to pull to EoB Transfers Overall transfer level: Needs assistance Equipment used: 1 person hand held assist Transfers: Sit to/from Stand Sit to Stand: Min assist General transfer comment: posterior bias initially; unsteady; reaching out to furniture walk Ambulation/Gait Ambulation/Gait assistance: Min guard, Min assist Ambulation Distance (Feet): 150 Feet Assistive device: Rolling walker (2 wheeled) Gait Pattern/deviations: Step-through pattern, Decreased step length - right, Decreased step length - left, Shuffle, Drifts right/left General Gait Details: hands on min guard with 1x minA to steady after LoB due to reduced foot clearance with R LE. slow, shuffling  gait with decreasing foot clearance as gait progressed, vc for picking up feet and proximity to RW Gait velocity: slowed Gait velocity interpretation: <1.8 ft/sec, indicate of risk for recurrent falls    ADL: ADL Overall ADL's : Needs assistance/impaired Eating/Feeding: Supervision/ safety Eating/Feeding Details (indicate cue type and reason): puree/honey thick liquids Grooming: Min guard, Standing, Supervision/safety Grooming Details (indicate cue type and reason): VC for sequencing adn problem solving Upper Body Bathing: Set up, Supervision/ safety, Sitting Lower Body Bathing: Minimal assistance, Sit to/from stand Upper Body Dressing : Set up, Supervision/safety, Sitting Lower Body Dressing: Minimal assistance, Sit to/from stand Toilet Transfer: Minimal assistance, Ambulation Toileting- Clothing Manipulation and Hygiene: Minimal assistance, Sit to/from stand Functional mobility during ADLs: Minimal assistance, Cueing for safety General ADL Comments: Pt walked to the sink to complete pericare. Pt began to wash her bottom by putting a drop of soap on her hands and reached back to her bottom then stated she was finished. Pt cued to use wash cloth and  needed cues for problem solving/sequencing  Cognition: Cognition Overall Cognitive Status: Within Functional Limits for tasks assessed Orientation Level: Oriented to person Cognition Arousal/Alertness: Awake/alert Behavior During Therapy: WFL for tasks assessed/performed Overall Cognitive Status: Within Functional Limits for tasks assessed  Blood pressure 110/74, pulse 62, temperature 98.3 F (36.8 C), temperature source Oral, resp. rate (!) 21, height 5\' 6"  (1.676 m), weight 55.3 kg (121 lb 14.6 oz), SpO2 96 %. Physical Exam  Constitutional: She appears distressed.  47 year old right-handed female  appearing older than stated age  HENT:  Head: Normocephalic.  Eyes: Right eye exhibits no discharge. Left eye exhibits no discharge.    Neck: No thyromegaly present.  Cardiovascular: Normal rate.  Respiratory: Effort normal.  GI: Soft.  Musculoskeletal: She exhibits no edema.  Neurological: No cranial nerve deficit.  Oriented to hospital, month, reason she's here. Provided me some biographical information. Limited insight and awareness. Intentional tremor in all 4 limbs. Mild limb ataxia. Senses LT. Motor 4/5 in UE, 3+ to 4/5 in LE's.   Skin: No rash noted. No erythema.  Psychiatric:  Anxious, cooperative    Results for orders placed or performed during the hospital encounter of 01/27/18 (from the past 24 hour(s))  Glucose, capillary     Status: Abnormal   Collection Time: 02/14/18  8:03 PM  Result Value Ref Range   Glucose-Capillary 119 (H) 65 - 99 mg/dL  Glucose, capillary     Status: Abnormal   Collection Time: 02/15/18 12:13 AM  Result Value Ref Range   Glucose-Capillary 131 (H) 65 - 99 mg/dL  Glucose, capillary     Status: Abnormal   Collection Time: 02/15/18  4:11 AM  Result Value Ref Range   Glucose-Capillary 124 (H) 65 - 99 mg/dL  Urinalysis, Routine w reflex microscopic     Status: Abnormal   Collection Time: 02/15/18  6:58 AM  Result Value Ref Range   Color, Urine YELLOW YELLOW   APPearance CLOUDY (A) CLEAR   Specific Gravity, Urine 1.028 1.005 - 1.030   pH 5.0 5.0 - 8.0   Glucose, UA NEGATIVE NEGATIVE mg/dL   Hgb urine dipstick NEGATIVE NEGATIVE   Bilirubin Urine NEGATIVE NEGATIVE   Ketones, ur NEGATIVE NEGATIVE mg/dL   Protein, ur NEGATIVE NEGATIVE mg/dL   Nitrite NEGATIVE NEGATIVE   Leukocytes, UA LARGE (A) NEGATIVE   RBC / HPF 21-50 0 - 5 RBC/hpf   WBC, UA >50 (H) 0 - 5 WBC/hpf   Bacteria, UA MANY (A) NONE SEEN   Squamous Epithelial / LPF 0-5 0 - 5   Mucus PRESENT    Budding Yeast PRESENT    Hyphae Yeast PRESENT    Hyaline Casts, UA PRESENT    Ca Oxalate Crys, UA PRESENT   Glucose, capillary     Status: Abnormal   Collection Time: 02/15/18  7:37 AM  Result Value Ref Range    Glucose-Capillary 129 (H) 65 - 99 mg/dL  Glucose, capillary     Status: Abnormal   Collection Time: 02/15/18 11:46 AM  Result Value Ref Range   Glucose-Capillary 172 (H) 65 - 99 mg/dL   Ct Head Wo Contrast  Result Date: 02/13/2018 CLINICAL DATA:  47 year old female with abnormal speech. Recent ICU admission with ARDS, alcohol withdrawal. EXAM: CT HEAD WITHOUT CONTRAST TECHNIQUE: Contiguous axial images were obtained from the base of the skull through the vertex without intravenous contrast. COMPARISON:  Face CT 07/17/2013. FINDINGS: Brain: Advanced for age generalized cerebral volume loss. No focal encephalomalacia identified. No midline shift, ventriculomegaly, mass effect, evidence of mass lesion, intracranial hemorrhage or evidence of cortically based acute infarction. Gray-white matter differentiation is within normal limits throughout the brain. Vascular: Mild Calcified atherosclerosis at the skull base. The distal left vertebral artery appears dominant. Skull: Negative. Sinuses/Orbits: Partially visible left side nasoenteric tube. The visible paranasal sinuses are well pneumatized. Tympanic cavities are clear. There are mild bilateral mastoid effusions. Other: Visualized orbit soft tissues are within normal limits. Visualized scalp soft tissues are within normal limits. IMPRESSION: 1.  No acute intracranial abnormality. Age advanced generalized cerebral volume loss, but otherwise negative noncontrast CT appearance of the brain. 2. Left side nasoenteric tube in place. Mild bilateral mastoid effusions likely related to recent intubation. Electronically Signed   By: Genevie Ann M.D.   On: 02/13/2018 17:22     Assessment/Plan: Diagnosis: functional and cognitive deficits related to ARDS and etoh/etoh withdrawal/encephalopathhy.  1. Does the need for close, 24 hr/day medical supervision in concert with the patient's rehab needs make it unreasonable for this patient to be served in a less intensive setting?  Yes 2. Co-Morbidities requiring supervision/potential complications: nutrition, swallowing, ID considerations 3. Due to bladder management, bowel management, safety, skin/wound care, disease management, medication administration, pain management and patient education, does the patient require 24 hr/day rehab nursing? Yes 4. Does the patient require coordinated care of a physician, rehab nurse, PT (1-2 hrs/day, 5 days/week) and OT (1-2 hrs/day, 5 days/week) to address physical and functional deficits in the context of the above medical diagnosis(es)? Yes Addressing deficits in the following areas: balance, endurance, locomotion, strength, transferring, bowel/bladder control, bathing, dressing, feeding, grooming, toileting and psychosocial support 5. Can the patient actively participate in an intensive therapy program of at least 3 hrs of therapy per day at least 5 days per week? Yes and Potentially 6. The potential for patient to make measurable gains while on inpatient rehab is excellent 7. Anticipated functional outcomes upon discharge from inpatient rehab are modified independent and supervision  with PT, modified independent and supervision with OT, modified independent and supervision with SLP. 8. Estimated rehab length of stay to reach the above functional goals is: 7-10 days 9. Anticipated D/C setting: Home 10. Anticipated post D/C treatments: HH therapy and Outpatient therapy 11. Overall Rehab/Functional Prognosis: excellent  RECOMMENDATIONS: This patient's condition is appropriate for continued rehabilitative care in the following setting: CIR Patient has agreed to participate in recommended program. Yes Note that insurance prior authorization may be required for reimbursement for recommended care.  Comment: Rehab Admissions Coordinator to follow up.  Thanks,  Meredith Staggers, MD, Mellody Drown   Lavon Paganini Angiulli, PA-C 02/15/2018

## 2018-02-15 NOTE — Progress Notes (Signed)
  Speech Language Pathology Treatment: Dysphagia  Patient Details Name: Natalie Morrison MRN: [id number] DOB: 1971/05/02 Today's Date: 02/15/2018 Time: 8677-3736 SLP Time Calculation (min) (ACUTE ONLY): 15 min  Assessment / Plan / Recommendation Clinical Impression  Pt needed Min cues for recall of results/recommendations from [facility] on previous date as well as Min cues for use of swallowing strategies during intake. At baseline her voice was wet, seemingly without pt awareness, but cleared with cued cough. Her voice and cough subjectively seem to be mildly improved today compared to previous date. No further overt signs of aspiration were noted, although sensation was impaired during swallow study. Recommend to continue with current diet (Dys 2 textures, honey thick liquids) with good potential for improvement with additional time post-extubation. This might be expedited with EMST to facilitate cough strength now that pt's mentation is improving. Will continue to follow.   HPI HPI: 47 y/o female with severe CAP causing ARDS. ETT x3: 4/25- 5/2 (self-extubated), 5/2-5-4, 5/4-5/7. PMH: EtOH abuse, HTN, hepatitis A, depression, asthma, anxiety, anemia      SLP Plan  Continue with current plan of care       Recommendations  Diet recommendations: Dysphagia 2 (fine chop);Honey-thick liquid Liquids provided via: Cup;Straw Medication Administration: Whole meds with puree Supervision: Patient able to self feed;Full supervision/cueing for compensatory strategies Compensations: Slow rate;Small sips/bites Postural Changes and/or Swallow Maneuvers: Seated upright 90 degrees                Oral Care Recommendations: Oral care BID Follow up Recommendations: Inpatient Rehab SLP Visit Diagnosis: Dysphagia, oropharyngeal phase (R13.12) Plan: Continue with current plan of care       GO                Germain Osgood 02/15/2018, 3:38 PM  Germain Osgood, M.A. CCC-SLP [phone]

## 2018-02-15 NOTE — Progress Notes (Signed)
Rehab Admissions Coordinator Note:  Patient was screened by Cleatrice Burke for appropriateness for an Inpatient Acute Rehab Consult per OT recommendation. Noted PT recommends HH. Due to complicated medical course, feel an inpt rehab consult is warranted.Cleatrice Burke 02/15/2018, 12:29 PM  I can be reached at [phone]

## 2018-02-15 NOTE — Progress Notes (Signed)
Occupational Therapy Evaluation Patient Details Name: Natalie Morrison MRN: [id number] DOB: [birth year] Today's Date: 02/15/2018    History of Present Illness 46 y.o. female w/ a hx of alcohol abuse, depression/anxiety, and asthma who presented to the ED w/ cough, shortness of breath, and cyanosis. Recently treated with antibiotic as outpatient for bronchitis.  Her husband found her to have markedly labored breathing and a bluish-purple hue, prompting her presentation to the ED. After presentation she developed rapid respiratory failure, was transferred to the ICU, and intubated due to pneumonia, ARDS, and alcohol withdrawal. Intubated 4/25, 5/2 self extubated, 5/4 extubated/reintubated 5/6 extubated   Clinical Impression   PTA, pt lived at home with husband and was independent with ADL and mobility. States she was driving and did not work because she was "in between jobs". Husband present for session. Pt is oriented to month but states it is 2025. Pt is unable to recall situation. Unable to recall diet restrictions.Pt presents with apparent cognitive deficits in addition to weakness and balance deficits. Feel pt could achieve level needed to DC home with intermittent S after short stay at CIR to increase her independence with ADL and functional mobility. Recommend CIR consult. Will follow acutely to address established goals and facilitate safe DC to next venue of  Care.     Follow Up Recommendations  CIR;Supervision/Assistance - 24 hour    Equipment Recommendations  3 in 1 bedside commode    Recommendations for Other Services Rehab consult     Precautions / Restrictions Precautions Precautions: Fall Restrictions Weight Bearing Restrictions: No      Mobility Bed Mobility Overal bed mobility: Modified Independent             General bed mobility comments: increased time and use of bedrails to pull to EoB  Transfers Overall transfer level: Needs assistance Equipment used: 1  person hand held assist Transfers: Sit to/from Stand Sit to Stand: Min assist         General transfer comment: posterior bias initially; unsteady; reaching out to furniture walk    Balance Overall balance assessment: Needs assistance Sitting-balance support: Feet supported;No upper extremity supported Sitting balance-Leahy Scale: Fair     Standing balance support: Bilateral upper extremity supported Standing balance-Leahy Scale: Poor Standing balance comment: needs external support                           ADL either performed or assessed with clinical judgement   ADL Overall ADL's : Needs assistance/impaired Eating/Feeding: Supervision/ safety Eating/Feeding Details (indicate cue type and reason): puree/honey thick liquids Grooming: Min guard;Standing;Supervision/safety Grooming Details (indicate cue type and reason): VC for sequencing adn problem solving Upper Body Bathing: Set up;Supervision/ safety;Sitting   Lower Body Bathing: Minimal assistance;Sit to/from stand   Upper Body Dressing : Set up;Supervision/safety;Sitting   Lower Body Dressing: Minimal assistance;Sit to/from stand   Toilet Transfer: Minimal assistance;Ambulation   Toileting- Clothing Manipulation and Hygiene: Minimal assistance;Sit to/from stand       Functional mobility during ADLs: Minimal assistance;Cueing for safety General ADL Comments: Pt walked to the sink to complete pericare. Pt began to wash her bottom by putting a drop of soap on her hands and reached back to her bottom then stated she was finished. Pt cued to use wash cloth and  needed cues for problem solving/sequencing     Vision Baseline Vision/History: Wears glasses Wears Glasses: Reading only Additional Comments: will further assess     Perception  Praxis      Pertinent Vitals/Pain Pain Assessment: No/denies pain     Hand Dominance Right   Extremity/Trunk Assessment Upper Extremity Assessment Upper  Extremity Assessment: Generalized weakness   Lower Extremity Assessment Lower Extremity Assessment: Generalized weakness   Cervical / Trunk Assessment Cervical / Trunk Assessment: Normal   Communication Communication Communication: No difficulties   Cognition Arousal/Alertness: Awake/alert Behavior During Therapy: WFL for tasks assessed/performed Overall Cognitive Status: Within Functional Limits for tasks assessed                                     General Comments  prior to activity BP 114/90, HR 88 bpm, SaO2 on RA 99%O2, post ambulation BP 121/88, HR 121 bpm, SaO2 on RA 98%    Exercises     Shoulder Instructions      Home Living Family/patient expects to be discharged to:: Private residence Living Arrangements: Spouse/significant other Available Help at Discharge: Family;Available PRN/intermittently Type of Home: House Home Access: Stairs to enter [employer] of Steps: 5 Entrance Stairs-Rails: Can reach both Home Layout: Multi-level(split level) Alternate Level Stairs-Number of Steps: 7/2   Bathroom Shower/Tub: Corporate investment banker: Standard Bathroom Accessibility: Yes How Accessible: Accessible via walker Home Equipment: Cane - single point;Grab bars - tub/shower;Hand held shower head;Walker - 2 wheels          Prior Functioning/Environment Level of Independence: Needs assistance  Gait / Transfers Assistance Needed: household ambulation  ADL's / Homemaking Assistance Needed: husband does shopping, cooking cleaning; pt states she drove adn is "in between jobs"            OT Problem List: Decreased strength;Decreased activity tolerance;Impaired balance (sitting and/or standing);Decreased cognition;Decreased safety awareness;Decreased knowledge of use of DME or AE;Cardiopulmonary status limiting activity      OT Treatment/Interventions: Self-care/ADL training;Therapeutic exercise;Energy conservation;DME and/or  AE instruction;Therapeutic activities;Cognitive remediation/compensation;Patient/family education;Balance training    OT Goals(Current goals can be found in the care plan section) Acute Rehab OT Goals Patient Stated Goal: go home to be with cats OT Goal Formulation: With patient/family Time For Goal Achievement: 03/01/18 Potential to Achieve Goals: Good  OT Frequency: Min 2X/week   Barriers to D/C:            Co-evaluation              AM-PAC PT "6 Clicks" Daily Activity     Outcome Measure Help from another person eating meals?: A Little Help from another person taking care of personal grooming?: A Little Help from another person toileting, which includes using toliet, bedpan, or urinal?: A Little Help from another person bathing (including washing, rinsing, drying)?: A Little Help from another person to put on and taking off regular upper body clothing?: A Little Help from another person to put on and taking off regular lower body clothing?: A Little 6 Click Score: 18   End of Session Equipment Utilized During Treatment: Gait belt Nurse Communication: Mobility status;Other (comment)(DC plan)  Activity Tolerance: Patient tolerated treatment well Patient left: in chair;with call bell/phone within reach;with chair alarm set;with family/visitor present   OT Visit Diagnosis: Unsteadiness on feet (R26.81);Other abnormalities of gait and mobility (R26.89);Muscle weakness (generalized) (M62.81);Other symptoms and signs involving cognitive function;Feeding difficulties (R63.3)                Time: 0998-3382 OT Time Calculation (min): 25 min Charges:  OT General Charges $OT  Visit: 1 Visit OT Evaluation $OT Eval Moderate Complexity: 1 Mod OT Treatments $Self Care/Home Management : 8-22 mins G-Codes:     Maurie Boettcher, OT/L  OT Clinical Specialist [phone]   [facility] 02/15/2018, 11:58 AM

## 2018-02-15 NOTE — Progress Notes (Signed)
Restraints were removed.  Patient was very pleasant and cooperative last night.  Patient is much more alert.  She expressed a desire to go home.

## 2018-02-15 NOTE — Evaluation (Addendum)
Physical Therapy Evaluation Addendum  Patient Details Name: Natalie Morrison MRN: [id number] DOB: [birth year] Today's Date: 02/15/2018   History of Present Illness  47 y.o. female w/ a hx of alcohol abuse, depression/anxiety, and asthma who presented to the ED w/ cough, shortness of breath, and cyanosis. Recently treated with antibiotic as outpatient for bronchitis.  Her husband found her to have markedly labored breathing and a bluish-purple hue, prompting her presentation to the ED. After presentation she developed rapid respiratory failure, was transferred to the ICU, and intubated due to pneumonia, ARDS, and alcohol withdrawal. Intubated 4/25, 5/2 self extubated, 5/4 extubated/reintubated 5/6 extubated  Clinical Impression  PTA pt with household ambulation with out AD, and husband performed cooking, cleaning and shopping. Pt currently limited in safe mobility by increased weakness, and decreased balance and endurance. Pt currently mod I for bed mobility, minA for powerup to RW, and min A for ambulation due to 1x LoB due to decreased foot clearance as pt fatigued.  PT will continue to follow acutely. **PT addendum of d/c recommendation to CIR after consultation with OT regarding cognitive deficits and safety awareness.**    Follow Up Recommendations CIR**    Equipment Recommendations  Rolling walker with 5" wheels       Precautions / Restrictions Precautions Precautions: Fall Restrictions Weight Bearing Restrictions: No      Mobility  Bed Mobility Overal bed mobility: Modified Independent             General bed mobility comments: increased time and use of bedrails to pull to EoB  Transfers Overall transfer level: Needs assistance Equipment used: Rolling walker (2 wheeled) Transfers: Sit to/from Stand Sit to Stand: Min assist         General transfer comment: min A for power up and steadying at RW, vc for hand placement for powerup  Ambulation/Gait Ambulation/Gait  assistance: Min guard;Min assist Ambulation Distance (Feet): 150 Feet Assistive device: Rolling walker (2 wheeled) Gait Pattern/deviations: Step-through pattern;Decreased step length - right;Decreased step length - left;Shuffle;Drifts right/left Gait velocity: slowed Gait velocity interpretation: <1.8 ft/sec, indicate of risk for recurrent falls General Gait Details: hands on min guard with 1x minA to steady after LoB due to reduced foot clearance with R LE. slow, shuffling gait with decreasing foot clearance as gait progressed, vc for picking up feet and proximity to RW      Balance Overall balance assessment: Needs assistance Sitting-balance support: Feet supported;No upper extremity supported Sitting balance-Leahy Scale: Fair     Standing balance support: Bilateral upper extremity supported Standing balance-Leahy Scale: Poor Standing balance comment: requires RW assist for balance                             Pertinent Vitals/Pain Pain Assessment: No/denies pain    Home Living Family/patient expects to be discharged to:: Private residence Living Arrangements: Spouse/significant other Available Help at Discharge: Family;Available PRN/intermittently Type of Home: House Home Access: Stairs to enter Entrance Stairs-Rails: Can reach both Entrance Stairs-Number of Steps: 5 Home Layout: One level Home Equipment: Cane - single point;Grab bars - tub/shower;Hand held shower head      Prior Function Level of Independence: Needs assistance   Gait / Transfers Assistance Needed: household ambulation   ADL's / Homemaking Assistance Needed: husband does shopping, cooking cleaning           Extremity/Trunk Assessment   Upper Extremity Assessment Upper Extremity Assessment: Generalized weakness    Lower Extremity  Assessment Lower Extremity Assessment: Generalized weakness       Communication   Communication: No difficulties  Cognition Arousal/Alertness:  Awake/alert Behavior During Therapy: WFL for tasks assessed/performed Overall Cognitive Status: Within Functional Limits for tasks assessed                                        General Comments General comments (skin integrity, edema, etc.): prior to activity BP 114/90, HR 88 bpm, SaO2 on RA 99%O2, post ambulation BP 121/88, HR 121 bpm, SaO2 on RA 98%        Assessment/Plan    PT Assessment Patient needs continued PT services  PT Problem List Decreased strength;Decreased activity tolerance;Decreased balance;Decreased mobility;Decreased knowledge of use of DME;Decreased safety awareness       PT Treatment Interventions DME instruction;Gait training;Stair training;Functional mobility training;Therapeutic activities;Therapeutic exercise;Balance training;Cognitive remediation;Patient/family education    PT Goals (Current goals can be found in the Care Plan section)  Acute Rehab PT Goals Patient Stated Goal: go home to be with cats PT Goal Formulation: With patient Time For Goal Achievement: 03/01/18 Potential to Achieve Goals: Fair    Frequency Min 3X/week    AM-PAC PT "6 Clicks" Daily Activity  Outcome Measure Difficulty turning over in bed (including adjusting bedclothes, sheets and blankets)?: A Little Difficulty moving from lying on back to sitting on the side of the bed? : A Little Difficulty sitting down on and standing up from a chair with arms (e.g., wheelchair, bedside commode, etc,.)?: Unable Help needed moving to and from a bed to chair (including a wheelchair)?: A Little Help needed walking in hospital room?: A Little Help needed climbing 3-5 steps with a railing? : A Lot 6 Click Score: 15    End of Session Equipment Utilized During Treatment: Gait belt Activity Tolerance: Patient tolerated treatment well Patient left: in chair;with call bell/phone within reach;with chair alarm set Nurse Communication: Mobility status PT Visit Diagnosis:  Unsteadiness on feet (R26.81);Other abnormalities of gait and mobility (R26.89);Muscle weakness (generalized) (M62.81);Difficulty in walking, not elsewhere classified (R26.2)    Time: 2979-8921 PT Time Calculation (min) (ACUTE ONLY): 33 min   Charges:   PT Evaluation $PT Eval Moderate Complexity: 1 Mod PT Treatments $Gait Training: 8-22 mins   PT G Codes:        Shante Maysonet B. Migdalia Dk PT, DPT Acute Rehabilitation  [phone] Pager [phone]    [place] 02/15/2018, 9:33 AM

## 2018-02-16 LAB — CBC
HCT: 33.4 % — ABNORMAL LOW (ref 36.0–46.0)
Hemoglobin: 10.8 g/dL — ABNORMAL LOW (ref 12.0–15.0)
MCH: 33.8 pg (ref 26.0–34.0)
MCHC: 32.3 g/dL (ref 30.0–36.0)
MCV: 104.4 fL — ABNORMAL HIGH (ref 78.0–100.0)
Platelets: 544 10*3/uL — ABNORMAL HIGH (ref 150–400)
RBC: 3.2 MIL/uL — ABNORMAL LOW (ref 3.87–5.11)
RDW: 12.7 % (ref 11.5–15.5)
WBC: 17.5 10*3/uL — ABNORMAL HIGH (ref 4.0–10.5)

## 2018-02-16 LAB — GLUCOSE, CAPILLARY
Glucose-Capillary: 133 mg/dL — ABNORMAL HIGH (ref 65–99)
Glucose-Capillary: 114 mg/dL — ABNORMAL HIGH (ref 65–99)

## 2018-02-16 LAB — COMPREHENSIVE METABOLIC PANEL
ALT: 24 U/L (ref 14–54)
AST: 19 U/L (ref 15–41)
Albumin: 3 g/dL — ABNORMAL LOW (ref 3.5–5.0)
Alkaline Phosphatase: 75 U/L (ref 38–126)
Anion gap: 10 (ref 5–15)
BUN: 20 mg/dL (ref 6–20)
CO2: 23 mmol/L (ref 22–32)
Calcium: 9.2 mg/dL (ref 8.9–10.3)
Chloride: 104 mmol/L (ref 101–111)
Creatinine, Ser: 0.58 mg/dL (ref 0.44–1.00)
GFR calc Af Amer: >60 mL/min (ref >60)
GFR calc non Af Amer: >60 mL/min (ref >60)
Glucose, Bld: 120 mg/dL — ABNORMAL HIGH (ref 65–99)
Potassium: 3.4 mmol/L — ABNORMAL LOW (ref 3.5–5.1)
Sodium: 137 mmol/L (ref 135–145)
Total Bilirubin: 0.5 mg/dL (ref 0.3–1.2)
Total Protein: 6.3 g/dL — ABNORMAL LOW (ref 6.5–8.1)

## 2018-02-16 LAB — MAGNESIUM: Magnesium: 1.9 mg/dL (ref 1.7–2.4)

## 2018-02-16 LAB — URINE CULTURE: Culture: 80000 — AB

## 2018-02-16 MED ORDER — CIPROFLOXACIN HCL 500 MG PO TABS: 500.0000 mg | ORAL_TABLET | Freq: Two times a day (BID) | ORAL | 0 refills | Status: AC

## 2018-02-16 MED ORDER — FOLIC ACID 1 MG PO TABS: 1.0000 mg | ORAL_TABLET | Freq: Every day | ORAL | 0 refills | Status: AC

## 2018-02-16 MED ORDER — FLUCONAZOLE 200 MG PO TABS: 200.0000 mg | ORAL_TABLET | Freq: Every day | ORAL | 0 refills | Status: DC

## 2018-02-16 MED ORDER — POTASSIUM CHLORIDE CRYS ER 20 MEQ PO TBCR: 40.0000 meq | EXTENDED_RELEASE_TABLET | Freq: Once | ORAL | Status: DC

## 2018-02-16 MED ORDER — METOPROLOL TARTRATE 25 MG PO TABS: 25.0000 mg | ORAL_TABLET | Freq: Two times a day (BID) | ORAL | 0 refills | Status: DC

## 2018-02-16 MED ORDER — FLUCONAZOLE 100 MG PO TABS: 200.0000 mg | ORAL_TABLET | Freq: Every day | ORAL | Status: DC

## 2018-02-16 NOTE — Progress Notes (Signed)
Noted d/c plans as outlined by RN CM. I will sign off. [phone]

## 2018-02-16 NOTE — Progress Notes (Signed)
I met with patient and spouse at bedside to discuss caregiver support at home. Natalie Morrison , may be able to assist. Spouse to verify if Clair Gulling can assist while he works, so that pt could d/c home with [facility]. Spouse to verify and let me know today to make d/c plans. I have updated RN CM. [phone]

## 2018-02-16 NOTE — Progress Notes (Signed)
I met with patient at bedside to begin discussions concerning goals of a CIR admit as well as clarify caregiver assist. Patient state she is alone 330 until 11 pm when her husband works. She and her husband have not discussed help at home. With her permission , I left a voicemail for her husband to contact me to discuss her rehab needs. I await call back to determine rehab venue. Noted by physical therapy and occupational therapy  patient with decreased safety awareness, apparent cognitive deficits, weakness and balance deficits. Dysphagia diet with honey thick liquids followed by SLP and tele sitter use in room. I updated RN CM of current status. [phone]

## 2018-02-16 NOTE — Progress Notes (Signed)
Patient and patient's husband given discharged instructions. Patient/husband educated on medication and making follow up appts. Patient/husband verbalized understanding. Patient discharging home with clothing and cell phone.

## 2018-02-16 NOTE — Care Management Note (Addendum)
Case Management Note  Patient Details  Name: ALBERTHA BEATTIE MRN: [id number] Date of Birth: Dec 06, 1970  Subjective/Objective:    From home with spouse, Acute Resp Failure, sepsis secondary to pan, HR down in 40';s today, asymptomatic, ARDS, passed MBS, started on dysp 2 diet, ct of head negative for anoxic injury, restraints dc'd, sitter still in room, wbc 21. For MRI if mental status does not improve.   5/15 Tomi Bamberger RN, BSN - per pt eval rec CIR , CIR consulted and recommended..Patient will be going home with [place], [place], [place], NCM offered her choice from [employer], she chose [facility], referral made to [place] with [facility].  Soc will begin 24-48 hrs post dc.  Husband states he asked his friend Clair Gulling to stay with his wife while he is at work.                               Action/Plan: DC home with [facility] services.   Expected Discharge Date:  02/16/18               Expected Discharge Plan:  [place]  In-House Referral:     Discharge planning Services  CM Consult  Post Acute Care Choice:    Choice offered to:  Patient  DME Arranged:    DME Agency:     HH Arranged:  RN, PT, Disease Management, OT [place] Agency:  [place]  Status of Service:  Completed, signed off  If discussed at [place] of Stay Meetings, dates discussed:    Additional Comments:  Zenon Mayo, RN 02/16/2018, 12:38 PM

## 2018-02-16 NOTE — Progress Notes (Signed)
I called spouse by phone, and discussed her rehab needs. He states he needs to talk to his wife and then will call me back. [phone]

## 2018-02-16 NOTE — Progress Notes (Signed)
Physical Therapy Treatment Patient Details Name: Natalie Morrison MRN: [id number] DOB: [birth year] Today's Date: 02/16/2018    History of Present Illness 47 y.o. female w/ a hx of alcohol abuse, depression/anxiety, and asthma who presented to the ED w/ cough, shortness of breath, and cyanosis. Recently treated with antibiotic as outpatient for bronchitis.  Her husband found her to have markedly labored breathing and a bluish-purple hue, prompting her presentation to the ED. After presentation she developed rapid respiratory failure, was transferred to the ICU, and intubated due to pneumonia, ARDS, and alcohol withdrawal. Intubated 4/25, 5/2 self extubated, 5/4 extubated/reintubated 5/6 extubated    PT Comments    Pt continues to be limited in her safe mobility secondary to decreased balance and decreased safety awareness. Pt is min guard for transfers and ambulation with RW. Pt is able to progress her ambulation today with increased distance, speed and balance challenge through head turns. Once back in room pt released from RW on one side of the room and proceeded to walk back to recliner reaching out to furniture to steady. Pt given vc for keeping RW for safety with ambulation and getting up from the recliner later. D/c recommendation continues to remain appropriate given pt's decreased safety awareness. PT will continue to follow acutely.    Follow Up Recommendations  CIR     Equipment Recommendations  Rolling walker with 5" wheels    Recommendations for Other Services       Precautions / Restrictions Precautions Precautions: Fall Restrictions Weight Bearing Restrictions: No    Mobility  Bed Mobility               General bed mobility comments: OOB in recliner on entry  Transfers Overall transfer level: Needs assistance Equipment used: Rolling walker (2 wheeled) Transfers: Sit to/from Stand Sit to Stand: Min guard         General transfer comment: min guard for safety    Ambulation/Gait Ambulation/Gait assistance: Min guard Ambulation Distance (Feet): 350 Feet Assistive device: Rolling walker (2 wheeled) Gait Pattern/deviations: Step-through pattern;Decreased step length - right;Decreased step length - left;Shuffle;Drifts right/left;Ataxic Gait velocity: slowed Gait velocity interpretation: <1.8 ft/sec, indicate of risk for recurrent falls General Gait Details: min guard for ambulation, slow, shuffling steps, with decreased drift from previous session with straight line walking, increased drift with head turns and gait       Balance Overall balance assessment: Needs assistance Sitting-balance support: Feet supported;No upper extremity supported Sitting balance-Leahy Scale: Fair     Standing balance support: Bilateral upper extremity supported Standing balance-Leahy Scale: Fair Standing balance comment: able to static stand and perform activities within boS             High level balance activites: Head turns High Level Balance Comments: increased drift with gait in head turns R&L and up&down            Cognition Arousal/Alertness: Awake/alert Behavior During Therapy: WFL for tasks assessed/performed Overall Cognitive Status: Within Functional Limits for tasks assessed                                           General Comments General comments (skin integrity, edema, etc.): VSS      Pertinent Vitals/Pain Pain Assessment: No/denies pain    Home Living Family/patient expects to be discharged to:: Private residence Living Arrangements: Spouse/significant other Available Help at Discharge: Family;Available PRN/intermittently  Type of Home: House Home Access: Stairs to enter Entrance Stairs-Rails: Can reach both Home Layout: One level Home Equipment: Cane - single point;Grab bars - tub/shower;Hand held shower head      Prior Function Level of Independence: Needs assistance  Gait / Transfers Assistance Needed:  household ambulation  ADL's / Homemaking Assistance Needed: husband does shopping, cooking cleaning     PT Goals (current goals can now be found in the care plan section) Acute Rehab PT Goals Patient Stated Goal: go home to be with cats PT Goal Formulation: With patient Time For Goal Achievement: 03/01/18 Potential to Achieve Goals: Fair    Frequency    Min 3X/week      PT Plan Current plan remains appropriate       AM-PAC PT "6 Clicks" Daily Activity  Outcome Measure  Difficulty turning over in bed (including adjusting bedclothes, sheets and blankets)?: A Little Difficulty moving from lying on back to sitting on the side of the bed? : A Little Difficulty sitting down on and standing up from a chair with arms (e.g., wheelchair, bedside commode, etc,.)?: Unable Help needed moving to and from a bed to chair (including a wheelchair)?: A Little Help needed walking in hospital room?: A Little Help needed climbing 3-5 steps with a railing? : A Lot 6 Click Score: 15    End of Session Equipment Utilized During Treatment: Gait belt Activity Tolerance: Patient tolerated treatment well Patient left: in chair;with call bell/phone within reach;with chair alarm set Nurse Communication: Mobility status PT Visit Diagnosis: Unsteadiness on feet (R26.81);Other abnormalities of gait and mobility (R26.89);Muscle weakness (generalized) (M62.81);Difficulty in walking, not elsewhere classified (R26.2)     Time: 7741-4239 PT Time Calculation (min) (ACUTE ONLY): 14 min  Charges:  $Gait Training: 8-22 mins                    G Codes:       Natalie Morrison B. Natalie Morrison PT, DPT Acute Rehabilitation  [phone] Pager [phone]     [place] 02/16/2018, 10:15 AM

## 2018-02-16 NOTE — Discharge Summary (Signed)
Physician Discharge Summary  Natalie Morrison PTW:[id number] DOB: 11-05-1970 DOA: 01/27/2018  PCP: Eulas Post, MD  Admit date: 01/27/2018 Discharge date: 02/16/2018  Admitted From: Home  Disposition:  Home with Home Health   Recommendations for Outpatient Follow-up:  1. Follow up with PCP in 1-2 weeks 2. Please obtain BMP/CBC in one week your next doctors visit.  3. Take oral Cipro for 3 more day as prescribed and 14 days of Oral Diflucan for Symptomatic Yeast in the urine.   Home Health: PT/OT/RN/Aide Equipment/Devices: None Discharge Condition: Stable CODE STATUS: Full  Diet recommendatio:  Regular - Dysphagia 2 - Fine chopped and honey thick liquid  Brief/Interim Summary: 47 year old female with history of alcohol abuse, depression, anxiety, asthma came to the ER with complains of cough, shortness of breath and appeared cyanotic.  Prior to admission she was recently treated for bronchitis with oral antibiotics.  In the ER patient rapidly became short of breath and there is concerns of pneumonia leading to ARDS therefore patient had to be intubated.  Initially started on broad-spectrum antibiotics and cultures were not done.  Bronchoscopy was performed on 4/29 with lavage.  Patient ended up self extubating herself on 5/2 and had to be reintubated, she again self extubated herself on 5/4 but due to stridor she had to be reintubated again.  Over the course of next 48 hours she was weaned off the ventilator and extubated on 5/6. Her hospital course was also complicated by acute alcohol withdrawal therefore to be placed on Precedex drip.  CT of the head was negative for any acute anoxic brain injury but did show advanced atrophy due to alcohol. Initially patient had Foley in place but was removed without any issues with urinary retention. Due to prolonged hospital course and extensive ICU stay she appeared quite deconditioned therefore was initially evaluated by physical therapy who  recommended patient would benefit from inpatient rehab.  Inpatient rehabilitation eval with the patient who thought patient was appropriate but over the course of another day as patient was waiting for insurance approval she appeared much stronger and was ambulating greater than 300 feet in the hallway therefore arrangements for home health were made.  Today patient has reached maximum benefit from hospital stay therefore stable to be discharged with outpatient follow-up-recommendations stated as above.   Discharge Diagnoses:  Principal Problem:   Acute respiratory failure with hypoxia (HCC) Active Problems:   Alcohol abuse   Sepsis due to pneumonia ([place])   Hypokalemia   Prolonged QT interval   Acute respiratory failure with hypoxia and hypercapnia (HCC)   Hypomagnesemia   Community acquired pneumonia   Airway intubation performed without difficulty  Sepsis secondary to healthcare acquired pneumonia leading to ARDS and acute hypoxic respiratory failure - Patient was initially intubated and she self extubated herself twice but both times had to be reintubated due to severe respiratory distress and stridor.  She completed her antibiotic course while she was in the ICU and eventually extubated on 5/6.  She was weaned off nasal cannula as well.  She was evaluated by speech and swallow therapy who recommended routine diet for her-dysphagia 2  Laryngeal edema with stridor -This is completely resolved  Acute delirium secondary to alcohol withdrawal -Initially required Precedex.  CT of the head was negative for acute pathology but showed chronic atrophy changes likely from extensive alcohol consumption. -Counseled to quit using alcohol.  Folic acid a multi-vitamin has been prescribed.  Advised to go to her rehab to help  refrain from using alcohol again  Urinary tract infection, bacterial and yeast -Will prescribe patient 14 days of Oral Diflucan and 3 days of Oral Cipro.   Discharge  Instructions   Allergies as of 02/16/2018      Reactions   Nitrofurantoin Hives   Other Other (See Comments)   Allergies mold, dust per allergy test   Sulfonamide Derivatives Other (See Comments)   Unknown allergic reaction per husband       Medication List    STOP taking these medications   amoxicillin-clavulanate 875-125 MG tablet Commonly known as:  AUGMENTIN   GLUCOSAMINE-CHONDROITIN PO     TAKE these medications   albuterol 90 MCG/ACT inhaler Commonly known as:  PROVENTIL,VENTOLIN Inhale 2 puffs into the lungs every 6 (six) hours as needed for wheezing or shortness of breath. Reported on 01/02/2016   cetirizine 10 MG chewable tablet Commonly known as:  ZYRTEC Chew 10 mg by mouth at bedtime.   ciprofloxacin 500 MG tablet Commonly known as:  CIPRO Take 1 tablet (500 mg total) by mouth 2 (two) times daily for 3 days.   cyanocobalamin 1000 MCG tablet Take 1 tablet (1,000 mcg total) by mouth daily.   fluconazole 200 MG tablet Commonly known as:  DIFLUCAN Take 1 tablet (200 mg total) by mouth daily for 14 days.   fluticasone 50 MCG/ACT nasal spray Commonly known as:  FLONASE Place 1 spray into both nostrils daily.   folic acid 1 MG tablet Commonly known as:  FOLVITE Take 1 tablet (1 mg total) by mouth daily. Start taking on:  02/17/2018   furosemide 20 MG tablet Commonly known as:  LASIX TAKE 1 TABLET BY MOUTH AS NEEDED FOR SWELLING.   metoprolol tartrate 25 MG tablet Commonly known as:  LOPRESSOR Take 1 tablet (25 mg total) by mouth 2 (two) times daily.   multivitamin with minerals Tabs tablet Take 1 tablet by mouth daily. One a Day Women's      Follow-up Information    Burchette, Alinda Sierras, MD. Schedule an appointment as soon as possible for a visit in 1 week(s).   Specialty:  Family Medicine Contact information: [address] [place] [address] [phone]          Allergies  Allergen Reactions  . Nitrofurantoin Hives  . Other  Other (See Comments)    Allergies mold, dust per allergy test  . Sulfonamide Derivatives Other (See Comments)    Unknown allergic reaction per husband     You were cared for by a hospitalist during your hospital stay. If you have any questions about your discharge medications or the care you received while you were in the hospital after you are discharged, you can call the unit and asked to speak with the hospitalist on call if the hospitalist that took care of you is not available. Once you are discharged, your primary care physician will handle any further medical issues. Please note that no refills for any discharge medications will be authorized once you are discharged, as it is imperative that you return to your primary care physician (or establish a relationship with a primary care physician if you do not have one) for your aftercare needs so that they can reassess your need for medications and monitor your lab values.  Consultations:  PM&R   Procedures/Studies: Dg Abd 1 View  Result Date: 02/09/2018 INDICATION: Need for feeding catheter EXAM: ABDOMEN - 1 VIEW COMPARISON:  02/02/2018 CONTRAST:  15 mL Isovue FLUOROSCOPY TIME:  2  minutes. 54 seconds. COMPLICATIONS: None immediate PROCEDURE: The Dobbhoff tube was lubricated with viscous lidocaine inserted into the right nostril. Under intermittent fluoroscopic guidance, the Dobbhoff tube was advanced through the stomach, through the duodenum with tip ultimately terminating over the expected location of the second portion of the duodenum. Contrast material was injected to confirm positioning a spot fluoroscopic image was saved for documentation purposes. The tube was affixed to the patient's nose with tape. The patient tolerated the procedure well without immediate postprocedural complication. FINDINGS: Placement of a feeding catheter into the second portion of the duodenum. IMPRESSION: Successful fluoroscopic guided placement of Dobbhoff tube with  tip terminating over the duodenojejunal junction. The tube is ready for immediate use. CLINICAL DATA:  Feeding catheter placement Electronically Signed   By: Inez Catalina M.D.   On: 02/09/2018 15:47   Ct Head Wo Contrast  Result Date: 02/13/2018 CLINICAL DATA:  47 year old female with abnormal speech. Recent ICU admission with ARDS, alcohol withdrawal. EXAM: CT HEAD WITHOUT CONTRAST TECHNIQUE: Contiguous axial images were obtained from the base of the skull through the vertex without intravenous contrast. COMPARISON:  Face CT 07/17/2013. FINDINGS: Brain: Advanced for age generalized cerebral volume loss. No focal encephalomalacia identified. No midline shift, ventriculomegaly, mass effect, evidence of mass lesion, intracranial hemorrhage or evidence of cortically based acute infarction. Gray-white matter differentiation is within normal limits throughout the brain. Vascular: Mild Calcified atherosclerosis at the skull base. The distal left vertebral artery appears dominant. Skull: Negative. Sinuses/Orbits: Partially visible left side nasoenteric tube. The visible paranasal sinuses are well pneumatized. Tympanic cavities are clear. There are mild bilateral mastoid effusions. Other: Visualized orbit soft tissues are within normal limits. Visualized scalp soft tissues are within normal limits. IMPRESSION: 1. No acute intracranial abnormality. Age advanced generalized cerebral volume loss, but otherwise negative noncontrast CT appearance of the brain. 2. Left side nasoenteric tube in place. Mild bilateral mastoid effusions likely related to recent intubation. Electronically Signed   By: Genevie Ann M.D.   On: 02/13/2018 17:22   Dg Chest Port 1 View  Result Date: 02/08/2018 CLINICAL DATA:  Respiratory difficulty EXAM: PORTABLE CHEST 1 VIEW COMPARISON:  02/06/2018 FINDINGS: Cardiac shadow is stable. Left-sided PICC line is again noted in satisfactory position. The nasogastric catheter and endotracheal tube have been  removed in the interval. Diffuse bilateral patchy opacities are noted throughout both lungs stable from the previous exam. The overall inspiratory effort is worse with some crowding of the lung opacities. IMPRESSION: Decreased respiratory effort when compared with the prior exam. Diffuse patchy opacities are again seen throughout both lungs. Electronically Signed   By: Inez Catalina M.D.   On: 02/08/2018 09:21   Dg Chest Port 1 View  Result Date: 02/06/2018 CLINICAL DATA:  Acute respiratory failure with hypoxemia EXAM: PORTABLE CHEST 1 VIEW COMPARISON:  Chest radiograph from one day prior. FINDINGS: Endotracheal tube tip is 3.8 cm above the carina. Enteric tube enters stomach with the tip not seen on this image. Left PICC terminates over the right atrium. Stable cardiomediastinal silhouette with normal heart size. No pneumothorax. No pleural effusion. Stable severe patchy opacities throughout both lungs. IMPRESSION: 1. Well-positioned support structures. 2. Stable severe diffuse patchy lung opacities suggesting multilobar pneumonia and/or ARDS. Electronically Signed   By: Ilona Sorrel M.D.   On: 02/06/2018 08:25   Portable Chest X-ray  Result Date: 02/05/2018 CLINICAL DATA:  Intubated EXAM: PORTABLE CHEST 1 VIEW COMPARISON:  Chest radiograph from earlier today. FINDINGS: Endotracheal tube tip is 3.3 cm  above the carina. Enteric tube terminates in the distal stomach. Left PICC terminates at the cavoatrial junction. Stable cardiomediastinal silhouette with normal heart size. No pneumothorax. No pleural effusion. Stable severe patchy opacities throughout both lungs. IMPRESSION: 1. Well-positioned support structures as detailed. Enteric tube terminates in the distal stomach. 2. Stable severe patchy opacities throughout both lungs most suggestive of multilobar pneumonia. Electronically Signed   By: Ilona Sorrel M.D.   On: 02/05/2018 12:19   Dg Chest Port 1 View  Result Date: 02/05/2018 CLINICAL DATA:  47 year old  female with history of acute respiratory failure. EXAM: PORTABLE CHEST 1 VIEW COMPARISON:  Chest x-ray 02/03/2018. FINDINGS: An endotracheal tube is in place with tip 2.9 cm above the carina. Nasogastric tube extends into the stomach. Lung volumes are low. Diffuse patchy interstitial and airspace disease scattered throughout the lungs bilaterally, with persistent similar poor aeration when compared to yesterday's examination. Probable small left pleural effusion. Pulmonary vasculature is obscured. Heart size appears grossly normal. Mediastinal contours are obscured. IMPRESSION: 1. Support apparatus, as above. 2. The appearance of the lungs is most suggestive of severe multilobar pneumonia, similar to the recent prior examination. Electronically Signed   By: Vinnie Langton M.D.   On: 02/05/2018 08:41   Dg Chest Port 1 View  Result Date: 02/03/2018 CLINICAL DATA:  Hypoxia EXAM: PORTABLE CHEST 1 VIEW COMPARISON:  Study obtained earlier in the day FINDINGS: Endotracheal tube tip is 2.8 cm above the carina. Central catheter tip is in the right atrium just beyond the cavoatrial junction. Nasogastric tube tip and side port are in the stomach. No pneumothorax. There is widespread interstitial and alveolar opacity. Heart is upper normal in size with pulmonary vascularity within normal limits. No evident adenopathy. No bone lesions. IMPRESSION: Tube and catheter positions as described without pneumothorax. Widespread interstitial alveolar opacity bilaterally. Suspect a degree of ARDS. There may well be pulmonary edema and/or pneumonia superimposed as well. The overall appearance is stable compared to earlier in the day. Stable cardiac silhouette. Electronically Signed   By: Lowella Grip III M.D.   On: 02/03/2018 13:00   Dg Chest Port 1 View  Result Date: 02/03/2018 CLINICAL DATA:  Endotracheal and OG tubes. EXAM: PORTABLE CHEST 1 VIEW COMPARISON:  02/02/2018 FINDINGS: Endotracheal tube tip measures 2.4 cm above  the carina. Enteric tube tip is off the field of view but below the left hemidiaphragm. Left central venous catheter tip over the right atrium. No pneumothorax. Shallow inspiration. Normal heart size. Diffuse bilateral airspace disease throughout the lungs. This could be due to edema, pneumonia, or ARDS. Small bilateral pleural effusions. IMPRESSION: Appliances appear in satisfactory position. Diffuse bilateral pulmonary infiltrates without significant change. Electronically Signed   By: Lucienne Capers M.D.   On: 02/03/2018 01:01   Dg Chest Port 1 View  Result Date: 02/02/2018 CLINICAL DATA:  Respiratory difficulty EXAM: PORTABLE CHEST 1 VIEW COMPARISON:  02/01/2018 FINDINGS: Endotracheal tube has been retracted. The tip is now 2.4 cm from the carina. Left subclavian central venous catheter stable. Upper normal heart size. Diffuse airspace opacities are stable. No pneumothorax. IMPRESSION: Stable diffuse airspace opacities. Endotracheal tube tip is now 2.4 cm from the carina. Electronically Signed   By: Marybelle Killings M.D.   On: 02/02/2018 09:34   Dg Chest Port 1 View  Result Date: 02/01/2018 CLINICAL DATA:  Status post intubation of the trachea EXAM: PORTABLE CHEST 1 VIEW COMPARISON:  Portable chest x-ray of January 31, 2018 FINDINGS: The tip of the endotracheal tube is  approximately 5 mm above the crotch of the carina. Withdrawal is needed. The lungs are well-expanded. There are confluent airspace opacities bilaterally which are stable. The left hemidiaphragm is obscured. The esophagogastric tube tip and proximal port project in the gastric cardia. The left PICC line tip projects over the cavoatrial junction. IMPRESSION: Withdrawal of the endotracheal tube by approximately 2-3 cm is recommended to avoid accidental mainstem bronchus intubation. Stable appearance of the widespread airspace opacities bilaterally. Electronically Signed   By: David  Martinique M.D.   On: 02/01/2018 09:25   Dg Chest Port 1  View  Result Date: 01/31/2018 CLINICAL DATA:  ET tube EXAM: PORTABLE CHEST 1 VIEW COMPARISON:  01/30/2018 FINDINGS: Endotracheal tube, left PICC line and NG tube remain in place, unchanged. Diffuse opacities throughout the lungs have worsened since prior study, left greater than right. Heart is borderline in size. Suspect small effusions bilaterally. IMPRESSION: Diffuse bilateral airspace opacities have worsened, left greater than right, concerning for pneumonia. Small bilateral effusions. Electronically Signed   By: Rolm Baptise M.D.   On: 01/31/2018 07:36   Dg Chest Port 1 View  Result Date: 01/30/2018 CLINICAL DATA:  History of ETT. EXAM: PORTABLE CHEST 1 VIEW COMPARISON:  January 29, 2018 FINDINGS: The ETT terminates in good position. The NG tube terminates in the stomach. Bilateral pulmonary opacities, left greater than right. The opacity is stable on the left and mildly improved on the right. Stable cardiomediastinal silhouette. IMPRESSION: 1. Mild improvement in the right-sided pulmonary opacity. Stable diffuse left-sided pulmonary opacity. 2. Support apparatus as above. Electronically Signed   By: Dorise Bullion III M.D   On: 01/30/2018 09:49   Dg Chest Port 1 View  Result Date: 01/29/2018 CLINICAL DATA:  47 year old female currently intubated EXAM: PORTABLE CHEST 1 VIEW COMPARISON:  Prior chest x-ray 01/28/2018 FINDINGS: The patient is intubated. The tip of the endotracheal tube is 2.9 cm above the carina. There is a left upper extremity PICC with the tip overlying the superior cavoatrial junction. A nasogastric tube is in place and coiled in the gastric fundus. Similar appearance of the lungs with diffuse bilateral interstitial and airspace opacities and small bilateral layering pleural effusions. No evidence of pneumothorax. No significant interval change in the degree of aeration. Osseous structures remain intact. IMPRESSION: 1. No significant interval change in the appearance of the chest over  the last 24 hours. Persistent diffuse bilateral interstitial and airspace opacities which may represent pulmonary edema and/or multifocal pneumonia. If the findings persist over time, ARDS is also a consideration. 2. Stable and satisfactory support apparatus. 3. Small bilateral layering pleural effusions. Electronically Signed   By: Jacqulynn Cadet M.D.   On: 01/29/2018 09:12   Dg Chest Port 1 View  Result Date: 01/28/2018 CLINICAL DATA:  Pneumonia.  Acute respiratory failure.  Sepsis. EXAM: PORTABLE CHEST 1 VIEW COMPARISON:  Radiographs dated 01/27/2018 FINDINGS: Endotracheal tube is 12 mm above the carina. NG tube tip is in the fundus of the stomach. Extensive bilateral pulmonary infiltrates are again noted, slightly progressed on the right and slightly improved in the left midzone. No effusions. Heart size and vascularity are normal. No bone abnormality. IMPRESSION: 1. Overall slight progression of bilateral pulmonary infiltrates. 2. Endotracheal tube appears to be 12 mm above the carina. Electronically Signed   By: Lorriane Shire M.D.   On: 01/28/2018 09:06   Dg Chest Portable 1 View  Result Date: 01/27/2018 CLINICAL DATA:  Endotracheal tube placement. EXAM: PORTABLE CHEST 1 VIEW COMPARISON:  Chest radiograph  January 27, 2017 chest radiograph January 27, 2018 FINDINGS: Endotracheal tube tip projects 2.1 cm above the carina. Nasogastric tube looped in proximal stomach with distal tip projecting back at gastric cardia.Worsening interstitial and alveolar airspace opacities. Increased lung volumes. Biapical pleural thickening. No pneumothorax. Soft tissue planes and included osseous structures are nonsuspicious. IMPRESSION: Worsening interstitial and alveolar airspace opacities concerning for bronchopneumonia. Endotracheal tube tip projects 2.1 cm above the carina. Nasogastric tube looped in stomach. Electronically Signed   By: Elon Alas M.D.   On: 01/27/2018 06:06   Dg Chest Portable 1  View  Result Date: 01/27/2018 CLINICAL DATA:  Shortness of breath, hypoxia. EXAM: PORTABLE CHEST 1 VIEW COMPARISON:  Chest radiograph October 30, 2016 FINDINGS: New diffuse inner cyst distal and alveolar airspace opacities. Small pleural effusions. Cardiomediastinal silhouette is normal. No pneumothorax. Soft tissue planes and included osseous structures are nonsuspicious. Mild dextroscoliosis could be positional. IMPRESSION: Diffuse interstitial and alveolar airspace opacities most compatible with bronchopneumonia. Electronically Signed   By: Elon Alas M.D.   On: 01/27/2018 01:10   Dg Abd Portable 1v  Result Date: 02/02/2018 CLINICAL DATA:  Evaluate for obstruction. No bowel movement for 1 week. EXAM: PORTABLE ABDOMEN - 1 VIEW COMPARISON:  None. FINDINGS: Moderate to large stool burden in the colon. Mild gaseous distention of the sigmoid colon. No small bowel distention to suggest small bowel obstruction. No free air organomegaly. No suspicious calcification. IMPRESSION: Moderate to large stool burden within the colon. Mild gaseous distention of the sigmoid colon, nonspecific. No convincing evidence for bowel obstruction. Electronically Signed   By: Rolm Baptise M.D.   On: 02/02/2018 11:34   [place] Ekg Site Rite  Result Date: 01/28/2018 If Site Rite image not attached, placement could not be confirmed due to current cardiac rhythm.    Subjective: No complaints, feels better.   HEENT/EYES = negative for pain, redness, loss of vision, double vision, blurred vision, loss of hearing, sore throat, hoarseness, dysphagia Cardiovascular= negative for chest pain, palpitation, murmurs, lower extremity swelling Respiratory/lungs= negative for shortness of breath, cough, hemoptysis, wheezing, mucus production Gastrointestinal= negative for nausea, vomiting,, abdominal pain, melena, hematemesis Genitourinary= negative for Dysuria, Hematuria, Change in Urinary Frequency MSK = Negative for arthralgia,  myalgias, Back Pain, Joint swelling  Neurology= Negative for headache, seizures, numbness, tingling  Psychiatry= Negative for anxiety, depression, suicidal and homocidal ideation Allergy/Immunology= Medication/Food allergy as listed  Skin= Negative for Rash, lesions, ulcers, itching   Discharge Exam: Vitals:   02/16/18 0004 02/16/18 0815  BP: 99/76 115/76  Pulse: 70 69  Resp:    Temp: (!) 97.5 F (36.4 C) 97.9 F (36.6 C)  SpO2: 99% 99%   Vitals:   02/15/18 1227 02/15/18 1700 02/16/18 0004 02/16/18 0815  BP: 110/74 (!) 117/100 99/76 115/76  Pulse: 62 71 70 69  Resp: (!) 21 (!) 22    Temp: 98.3 F (36.8 C) 97.9 F (36.6 C) (!) 97.5 F (36.4 C) 97.9 F (36.6 C)  TempSrc: Oral Oral Oral Oral  SpO2: 96%  99% 99%  Weight:      Height:        General: Pt is alert, awake, not in acute distress Cardiovascular: RRR, S1/S2 +, no rubs, no gallops Respiratory: CTA bilaterally, no wheezing, no rhonchi Abdominal: Soft, NT, ND, bowel sounds + Extremities: no edema, no cyanosis, 4+/5 strenght in all 4 ext.     The results of significant diagnostics from this hospitalization (including imaging, microbiology, ancillary and laboratory) are listed below for  reference.     Microbiology: Recent Results (from the past 240 hour(s))  Urine Culture     Status: Abnormal   Collection Time: 02/15/18  7:25 AM  Result Value Ref Range Status   Specimen Description URINE, RANDOM  Final   Special Requests   Final    NONE Performed at [facility], [address], [place], [place] [address]    Culture 80,000 COLONIES/mL YEAST (A)  Final   Report Status 02/16/2018 FINAL  Final     Labs: BNP (last 3 results) No results for input(s): BNP in the last 8760 hours. Basic Metabolic Panel: Recent Labs  Lab 02/10/18 0246 02/12/18 0520 02/14/18 0500 02/16/18 0346  NA 142 143 141 137  K 3.8 3.7 3.8 3.4*  CL 111 108 109 104  CO2 22 25 23 23   GLUCOSE 150* 135* 131* 120*  BUN 19 18 24*  20  CREATININE 0.60 0.58 0.58 0.58  CALCIUM 9.2 9.2 9.5 9.2  MG  --  2.0  --  1.9  PHOS  --  4.6  --   --    Liver Function Tests: Recent Labs  Lab 02/12/18 0520 02/14/18 0500 02/16/18 0346  AST 21 18 19   ALT 20 19 24   ALKPHOS 95 86 75  BILITOT 0.4 0.3 0.5  PROT 6.2* 6.5 6.3*  ALBUMIN 2.8* 3.1* 3.0*   No results for input(s): LIPASE, AMYLASE in the last 168 hours. Recent Labs  Lab 02/13/18 0338  AMMONIA 18   CBC: Recent Labs  Lab 02/12/18 0520 02/14/18 0500 02/16/18 0346  WBC 22.0* 21.0* 17.5*  HGB 11.4* 11.8* 10.8*  HCT 35.9* 37.1 33.4*  MCV 107.5* 106.9* 104.4*  PLT 835* 809* 544*   Cardiac Enzymes: No results for input(s): CKTOTAL, CKMB, CKMBINDEX, TROPONINI in the last 168 hours. BNP: Invalid input(s): POCBNP CBG: Recent Labs  Lab 02/15/18 0737 02/15/18 1146 02/15/18 1627 02/15/18 2049 02/16/18 0802  GLUCAP 129* 172* 104* 116* 133*   D-Dimer No results for input(s): DDIMER in the last 72 hours. Hgb A1c No results for input(s): HGBA1C in the last 72 hours. Lipid Profile No results for input(s): CHOL, HDL, LDLCALC, TRIG, CHOLHDL, LDLDIRECT in the last 72 hours. Thyroid function studies No results for input(s): TSH, T4TOTAL, T3FREE, THYROIDAB in the last 72 hours.  Invalid input(s): FREET3 Anemia work up No results for input(s): VITAMINB12, FOLATE, FERRITIN, TIBC, IRON, RETICCTPCT in the last 72 hours. Urinalysis    Component Value Date/Time   COLORURINE YELLOW 02/15/2018 0658   APPEARANCEUR CLOUDY (A) 02/15/2018 0658   LABSPEC 1.028 02/15/2018 0658   PHURINE 5.0 02/15/2018 0658   GLUCOSEU NEGATIVE 02/15/2018 0658   HGBUR NEGATIVE 02/15/2018 0658   HGBUR negative 01/01/2010 1340   [place] 02/15/2018 0658   BILIRUBINUR 3+ 03/18/2016 1614   KETONESUR NEGATIVE 02/15/2018 0658   PROTEINUR NEGATIVE 02/15/2018 0658   UROBILINOGEN 2.0 03/18/2016 1614   UROBILINOGEN 0.2 05/06/2013 1440   NITRITE NEGATIVE 02/15/2018 0658    LEUKOCYTESUR LARGE (A) 02/15/2018 0658   Sepsis Labs Invalid input(s): PROCALCITONIN,  WBC,  LACTICIDVEN Microbiology Recent Results (from the past 240 hour(s))  Urine Culture     Status: Abnormal   Collection Time: 02/15/18  7:25 AM  Result Value Ref Range Status   Specimen Description URINE, RANDOM  Final   Special Requests   Final    NONE Performed at [facility], [place] [address] [place], [place] [address]    Culture 80,000 COLONIES/mL YEAST (  A)  Final   Report Status 02/16/2018 FINAL  Final   Time coordinating discharge:  I have spent 35 minutes face to face with the patient and on the ward discussing the patients care, assessment, plan and disposition with other care givers. >50% of the time was devoted counseling the patient about the risks and benefits of treatment/Discharge disposition and coordinating care.   SIGNED:   Damita Lack, MD  [facility] 02/16/2018, 12:05 PM Pager   If 7PM-7AM, please contact night-coverage [email or web address] Password [email or web address]

## 2018-02-21 LAB — CULTURE, FUNGUS WITHOUT SMEAR

## 2018-02-22 ENCOUNTER — Inpatient Hospital Stay (HOSPITAL_COMMUNITY): Payer: BLUE CROSS/BLUE SHIELD

## 2018-02-22 ENCOUNTER — Inpatient Hospital Stay (HOSPITAL_COMMUNITY)
Admission: EM | Admit: 2018-02-22 | Discharge: 2018-03-01 | DRG: 871 | Disposition: A | Discharge status: Home / Self Care | Payer: BLUE CROSS/BLUE SHIELD | Attending: Internal Medicine | Admitting: Internal Medicine

## 2018-02-22 ENCOUNTER — Emergency Department (HOSPITAL_COMMUNITY): Payer: BLUE CROSS/BLUE SHIELD

## 2018-02-22 ENCOUNTER — Encounter (HOSPITAL_COMMUNITY): Payer: Self-pay | Admitting: Emergency Medicine

## 2018-02-22 DIAGNOSIS — Z882 Allergy status to sulfonamides status: Secondary | ICD-10-CM | POA: Diagnosis not present

## 2018-02-22 DIAGNOSIS — Z8249 Family history of ischemic heart disease and other diseases of the circulatory system: Secondary | ICD-10-CM | POA: Diagnosis not present

## 2018-02-22 DIAGNOSIS — L89300 Pressure ulcer of unspecified buttock, unstageable: Secondary | ICD-10-CM | POA: Diagnosis present

## 2018-02-22 DIAGNOSIS — L8990 Pressure ulcer of unspecified site, unspecified stage: Secondary | ICD-10-CM

## 2018-02-22 DIAGNOSIS — E876 Hypokalemia: Secondary | ICD-10-CM

## 2018-02-22 DIAGNOSIS — R739 Hyperglycemia, unspecified: Secondary | ICD-10-CM | POA: Diagnosis not present

## 2018-02-22 DIAGNOSIS — J45909 Unspecified asthma, uncomplicated: Secondary | ICD-10-CM | POA: Diagnosis present

## 2018-02-22 DIAGNOSIS — I5033 Acute on chronic diastolic (congestive) heart failure: Secondary | ICD-10-CM | POA: Diagnosis present

## 2018-02-22 DIAGNOSIS — R1011 Right upper quadrant pain: Secondary | ICD-10-CM

## 2018-02-22 DIAGNOSIS — Z8701 Personal history of pneumonia (recurrent): Secondary | ICD-10-CM

## 2018-02-22 DIAGNOSIS — E2740 Unspecified adrenocortical insufficiency: Secondary | ICD-10-CM | POA: Diagnosis not present

## 2018-02-22 DIAGNOSIS — F419 Anxiety disorder, unspecified: Secondary | ICD-10-CM | POA: Diagnosis present

## 2018-02-22 DIAGNOSIS — J80 Acute respiratory distress syndrome: Secondary | ICD-10-CM | POA: Diagnosis present

## 2018-02-22 DIAGNOSIS — Z79899 Other long term (current) drug therapy: Secondary | ICD-10-CM | POA: Diagnosis not present

## 2018-02-22 DIAGNOSIS — Z888 Allergy status to other drugs, medicaments and biological substances status: Secondary | ICD-10-CM | POA: Diagnosis not present

## 2018-02-22 DIAGNOSIS — R6520 Severe sepsis without septic shock: Secondary | ICD-10-CM | POA: Diagnosis not present

## 2018-02-22 DIAGNOSIS — Y92230 Patient room in hospital as the place of occurrence of the external cause: Secondary | ICD-10-CM | POA: Diagnosis not present

## 2018-02-22 DIAGNOSIS — T380X5A Adverse effect of glucocorticoids and synthetic analogues, initial encounter: Secondary | ICD-10-CM | POA: Diagnosis not present

## 2018-02-22 DIAGNOSIS — J9601 Acute respiratory failure with hypoxia: Secondary | ICD-10-CM

## 2018-02-22 DIAGNOSIS — F329 Major depressive disorder, single episode, unspecified: Secondary | ICD-10-CM | POA: Diagnosis present

## 2018-02-22 DIAGNOSIS — R4702 Dysphasia: Secondary | ICD-10-CM | POA: Diagnosis present

## 2018-02-22 DIAGNOSIS — I110 Hypertensive heart disease with heart failure: Secondary | ICD-10-CM | POA: Diagnosis present

## 2018-02-22 DIAGNOSIS — J690 Pneumonitis due to inhalation of food and vomit: Secondary | ICD-10-CM | POA: Diagnosis present

## 2018-02-22 DIAGNOSIS — R0603 Acute respiratory distress: Secondary | ICD-10-CM

## 2018-02-22 DIAGNOSIS — A419 Sepsis, unspecified organism: Secondary | ICD-10-CM | POA: Diagnosis not present

## 2018-02-22 DIAGNOSIS — F10121 Alcohol abuse with intoxication delirium: Secondary | ICD-10-CM | POA: Diagnosis not present

## 2018-02-22 DIAGNOSIS — J189 Pneumonia, unspecified organism: Secondary | ICD-10-CM | POA: Diagnosis present

## 2018-02-22 DIAGNOSIS — F10129 Alcohol abuse with intoxication, unspecified: Secondary | ICD-10-CM | POA: Diagnosis present

## 2018-02-22 DIAGNOSIS — E872 Acidosis: Secondary | ICD-10-CM | POA: Diagnosis present

## 2018-02-22 DIAGNOSIS — R109 Unspecified abdominal pain: Secondary | ICD-10-CM

## 2018-02-22 DIAGNOSIS — D649 Anemia, unspecified: Secondary | ICD-10-CM | POA: Diagnosis present

## 2018-02-22 DIAGNOSIS — Y95 Nosocomial condition: Secondary | ICD-10-CM | POA: Diagnosis present

## 2018-02-22 DIAGNOSIS — F320 Major depressive disorder, single episode, mild: Secondary | ICD-10-CM | POA: Diagnosis not present

## 2018-02-22 DIAGNOSIS — Z7141 Alcohol abuse counseling and surveillance of alcoholic: Secondary | ICD-10-CM

## 2018-02-22 DIAGNOSIS — I5032 Chronic diastolic (congestive) heart failure: Secondary | ICD-10-CM | POA: Diagnosis not present

## 2018-02-22 DIAGNOSIS — F17210 Nicotine dependence, cigarettes, uncomplicated: Secondary | ICD-10-CM | POA: Diagnosis present

## 2018-02-22 LAB — RESPIRATORY PANEL BY PCR

## 2018-02-22 LAB — GLUCOSE, CAPILLARY
Glucose-Capillary: 108 mg/dL — ABNORMAL HIGH (ref 65–99)
Glucose-Capillary: 119 mg/dL — ABNORMAL HIGH (ref 65–99)
Glucose-Capillary: 131 mg/dL — ABNORMAL HIGH (ref 65–99)
Glucose-Capillary: 138 mg/dL — ABNORMAL HIGH (ref 65–99)
Glucose-Capillary: 81 mg/dL (ref 65–99)
Glucose-Capillary: 98 mg/dL (ref 65–99)

## 2018-02-22 LAB — RAPID URINE DRUG SCREEN, HOSP PERFORMED
Amphetamines: NOT DETECTED
Barbiturates: NOT DETECTED
Benzodiazepines: NOT DETECTED
Cocaine: NOT DETECTED
Opiates: NOT DETECTED
Tetrahydrocannabinol: NOT DETECTED

## 2018-02-22 LAB — CBC WITH DIFFERENTIAL/PLATELET
Basophils Absolute: 0 10*3/uL (ref 0.0–0.1)
Basophils Relative: 0 %
Eosinophils Absolute: 0.4 10*3/uL (ref 0.0–0.7)
Eosinophils Relative: 1 %
HCT: 32.4 % — ABNORMAL LOW (ref 36.0–46.0)
Hemoglobin: 10.9 g/dL — ABNORMAL LOW (ref 12.0–15.0)
Lymphocytes Relative: 5 %
Lymphs Abs: 2.1 10*3/uL (ref 0.7–4.0)
MCH: 33.5 pg (ref 26.0–34.0)
MCHC: 33.6 g/dL (ref 30.0–36.0)
MCV: 99.7 fL (ref 78.0–100.0)
Monocytes Absolute: 2.5 10*3/uL — ABNORMAL HIGH (ref 0.1–1.0)
Monocytes Relative: 6 %
Neutro Abs: 37.1 10*3/uL — ABNORMAL HIGH (ref 1.7–7.7)
Neutrophils Relative %: 88 %
Platelets: 444 10*3/uL — ABNORMAL HIGH (ref 150–400)
RBC: 3.25 MIL/uL — ABNORMAL LOW (ref 3.87–5.11)
RDW: 13.1 % (ref 11.5–15.5)
WBC: 42.1 10*3/uL — ABNORMAL HIGH (ref 4.0–10.5)

## 2018-02-22 LAB — PROCALCITONIN: Procalcitonin: 0.57 ng/mL

## 2018-02-22 LAB — BRAIN NATRIURETIC PEPTIDE: B Natriuretic Peptide: 113.3 pg/mL — ABNORMAL HIGH (ref 0.0–100.0)

## 2018-02-22 LAB — BASIC METABOLIC PANEL
Anion gap: 10 (ref 5–15)
BUN: 5 mg/dL — ABNORMAL LOW (ref 6–20)
CO2: 18 mmol/L — ABNORMAL LOW (ref 22–32)
Calcium: 7.5 mg/dL — ABNORMAL LOW (ref 8.9–10.3)
Chloride: 110 mmol/L (ref 101–111)
Creatinine, Ser: 0.62 mg/dL (ref 0.44–1.00)
GFR calc Af Amer: >60 mL/min (ref >60)
GFR calc non Af Amer: >60 mL/min (ref >60)
Glucose, Bld: 107 mg/dL — ABNORMAL HIGH (ref 65–99)
Potassium: 2.8 mmol/L — ABNORMAL LOW (ref 3.5–5.1)
Sodium: 138 mmol/L (ref 135–145)

## 2018-02-22 LAB — COMPREHENSIVE METABOLIC PANEL
ALT: 14 U/L (ref 14–54)
AST: 16 U/L (ref 15–41)
Albumin: 2.9 g/dL — ABNORMAL LOW (ref 3.5–5.0)
Alkaline Phosphatase: 100 U/L (ref 38–126)
Anion gap: 17 — ABNORMAL HIGH (ref 5–15)
BUN: 5 mg/dL — ABNORMAL LOW (ref 6–20)
CO2: 14 mmol/L — ABNORMAL LOW (ref 22–32)
Calcium: 9 mg/dL (ref 8.9–10.3)
Chloride: 102 mmol/L (ref 101–111)
Creatinine, Ser: 0.71 mg/dL (ref 0.44–1.00)
GFR calc Af Amer: >60 mL/min (ref >60)
GFR calc non Af Amer: >60 mL/min (ref >60)
Glucose, Bld: 159 mg/dL — ABNORMAL HIGH (ref 65–99)
Potassium: 2.9 mmol/L — ABNORMAL LOW (ref 3.5–5.1)
Sodium: 133 mmol/L — ABNORMAL LOW (ref 135–145)
Total Bilirubin: 0.7 mg/dL (ref 0.3–1.2)
Total Protein: 6.4 g/dL — ABNORMAL LOW (ref 6.5–8.1)

## 2018-02-22 LAB — BASIC METABOLIC PANEL WITH GFR
Anion gap: 7 (ref 5–15)
BUN: <5 mg/dL — ABNORMAL LOW (ref 6–20)
CO2: 20 mmol/L — ABNORMAL LOW (ref 22–32)
Calcium: 7.7 mg/dL — ABNORMAL LOW (ref 8.9–10.3)
Chloride: 112 mmol/L — ABNORMAL HIGH (ref 101–111)
Creatinine, Ser: 0.52 mg/dL (ref 0.44–1.00)
GFR calc Af Amer: >60 mL/min
GFR calc non Af Amer: >60 mL/min
Glucose, Bld: 138 mg/dL — ABNORMAL HIGH (ref 65–99)
Potassium: 3.7 mmol/L (ref 3.5–5.1)
Sodium: 139 mmol/L (ref 135–145)

## 2018-02-22 LAB — I-STAT CG4 LACTIC ACID, ED
Lactic Acid, Venous: 4.01 mmol/L (ref 0.5–1.9)
Lactic Acid, Venous: 1.08 mmol/L (ref 0.5–1.9)

## 2018-02-22 LAB — I-STAT BETA HCG BLOOD, ED (MC, WL, AP ONLY): I-stat hCG, quantitative: 6.6 m[IU]/mL — ABNORMAL HIGH (ref <5)

## 2018-02-22 LAB — ECHOCARDIOGRAM COMPLETE
Height: 65 in
Weight: 2081.14 oz

## 2018-02-22 LAB — I-STAT ARTERIAL BLOOD GAS, ED
Acid-base deficit: 5 mmol/L — ABNORMAL HIGH (ref 0.0–2.0)
Bicarbonate: 16.7 mmol/L — ABNORMAL LOW (ref 20.0–28.0)
O2 Saturation: 99 %
Patient temperature: 103.8
TCO2: 17 mmol/L — ABNORMAL LOW (ref 22–32)
pCO2 arterial: 24.8 mmHg — ABNORMAL LOW (ref 32.0–48.0)
pH, Arterial: 7.446 (ref 7.350–7.450)
pO2, Arterial: 164 mmHg — ABNORMAL HIGH (ref 83.0–108.0)

## 2018-02-22 LAB — CBC
HCT: 28.6 % — ABNORMAL LOW (ref 36.0–46.0)
Hemoglobin: 9.3 g/dL — ABNORMAL LOW (ref 12.0–15.0)
MCH: 33.6 pg (ref 26.0–34.0)
MCHC: 32.5 g/dL (ref 30.0–36.0)
MCV: 103.2 fL — ABNORMAL HIGH (ref 78.0–100.0)
Platelets: 272 10*3/uL (ref 150–400)
RBC: 2.77 MIL/uL — ABNORMAL LOW (ref 3.87–5.11)
RDW: 13.4 % (ref 11.5–15.5)
WBC: 37.2 10*3/uL — ABNORMAL HIGH (ref 4.0–10.5)

## 2018-02-22 LAB — URINALYSIS, ROUTINE W REFLEX MICROSCOPIC
Bilirubin Urine: NEGATIVE
Glucose, UA: NEGATIVE mg/dL
Hgb urine dipstick: NEGATIVE
Ketones, ur: NEGATIVE mg/dL
Leukocytes, UA: NEGATIVE
Nitrite: NEGATIVE
Protein, ur: NEGATIVE mg/dL
Specific Gravity, Urine: 1.003 — ABNORMAL LOW (ref 1.005–1.030)
pH: 5 (ref 5.0–8.0)

## 2018-02-22 LAB — MAGNESIUM
Magnesium: 1.1 mg/dL — ABNORMAL LOW (ref 1.7–2.4)
Magnesium: 1.9 mg/dL (ref 1.7–2.4)

## 2018-02-22 LAB — CK: Total CK: 10 U/L — ABNORMAL LOW (ref 38–234)

## 2018-02-22 LAB — STREP PNEUMONIAE URINARY ANTIGEN: Strep Pneumo Urinary Antigen: NEGATIVE

## 2018-02-22 LAB — TROPONIN I: Troponin I: <0.03 ng/mL (ref <0.03)

## 2018-02-22 LAB — I-STAT TROPONIN, ED: Troponin i, poc: 0.02 ng/mL (ref 0.00–0.08)

## 2018-02-22 LAB — LACTIC ACID, PLASMA
Lactic Acid, Venous: 1.2 mmol/L (ref 0.5–1.9)
Lactic Acid, Venous: 1.1 mmol/L (ref 0.5–1.9)

## 2018-02-22 LAB — PROTIME-INR
INR: 1.16
Prothrombin Time: 14.7 seconds (ref 11.4–15.2)

## 2018-02-22 LAB — CORTISOL: Cortisol, Plasma: 9.4 ug/dL

## 2018-02-22 LAB — MRSA PCR SCREENING: MRSA by PCR: INVALID — AB

## 2018-02-22 LAB — PHOSPHORUS: Phosphorus: 3.5 mg/dL (ref 2.5–4.6)

## 2018-02-22 LAB — LIPASE, BLOOD: Lipase: 23 U/L (ref 11–51)

## 2018-02-22 MED ORDER — HYDROCORTISONE NA SUCCINATE PF 100 MG IJ SOLR
50.0000 mg | Freq: Four times a day (QID) | INTRAMUSCULAR | Status: DC
  Administered 2018-02-22: 50 mg via INTRAVENOUS
  Filled 2018-02-22: qty 2

## 2018-02-22 MED ORDER — VANCOMYCIN HCL IN DEXTROSE 1-5 GM/200ML-% IV SOLN
1000.0000 mg | Freq: Once | INTRAVENOUS | Status: AC
  Administered 2018-02-22: 1000 mg via INTRAVENOUS
  Filled 2018-02-22: qty 200

## 2018-02-22 MED ORDER — VANCOMYCIN HCL IN DEXTROSE 750-5 MG/150ML-% IV SOLN
750.0000 mg | Freq: Two times a day (BID) | INTRAVENOUS | Status: DC
  Administered 2018-02-22: 750 mg via INTRAVENOUS
  Filled 2018-02-22: qty 150

## 2018-02-22 MED ORDER — ORAL CARE MOUTH RINSE
15.0000 mL | Freq: Two times a day (BID) | OROMUCOSAL | Status: DC
  Administered 2018-02-22 – 2018-03-01 (×12): 15 mL via OROMUCOSAL

## 2018-02-22 MED ORDER — ACETAMINOPHEN 325 MG PO TABS
650.0000 mg | ORAL_TABLET | Freq: Four times a day (QID) | ORAL | Status: DC | PRN
Start: 2018-02-22 — End: 2018-03-01
  Administered 2018-02-22 – 2018-03-01 (×7): 650 mg via ORAL
  Filled 2018-02-22 (×6): qty 2

## 2018-02-22 MED ORDER — VANCOMYCIN HCL IN DEXTROSE 750-5 MG/150ML-% IV SOLN
750.0000 mg | Freq: Two times a day (BID) | INTRAVENOUS | Status: DC
  Administered 2018-02-23 – 2018-02-24 (×3): 750 mg via INTRAVENOUS
  Filled 2018-02-22 (×3): qty 150

## 2018-02-22 MED ORDER — VANCOMYCIN HCL IN DEXTROSE 750-5 MG/150ML-% IV SOLN
750.0000 mg | Freq: Two times a day (BID) | INTRAVENOUS | Status: DC
Start: 2018-02-23 — End: 2018-02-22

## 2018-02-22 MED ORDER — SODIUM CHLORIDE 0.9 % IV SOLN: 250.0000 mL | INTRAVENOUS | Status: DC | PRN

## 2018-02-22 MED ORDER — ACETAMINOPHEN 325 MG PO TABS
650.0000 mg | ORAL_TABLET | Freq: Once | ORAL | Status: AC
  Administered 2018-02-22: 650 mg via ORAL
  Filled 2018-02-22: qty 2

## 2018-02-22 MED ORDER — THIAMINE HCL 100 MG/ML IJ SOLN
100.0000 mg | Freq: Every day | INTRAMUSCULAR | Status: DC
  Filled 2018-02-22: qty 2

## 2018-02-22 MED ORDER — NICOTINE 21 MG/24HR TD PT24
21.0000 mg | MEDICATED_PATCH | Freq: Every day | TRANSDERMAL | Status: DC
  Administered 2018-02-22 – 2018-03-01 (×8): 21 mg via TRANSDERMAL
  Filled 2018-02-22 (×9): qty 1

## 2018-02-22 MED ORDER — HEPARIN SODIUM (PORCINE) 5000 UNIT/ML IJ SOLN
5000.0000 [IU] | Freq: Three times a day (TID) | INTRAMUSCULAR | Status: DC
  Administered 2018-02-22 – 2018-03-01 (×22): 5000 [IU] via SUBCUTANEOUS
  Filled 2018-02-22 (×22): qty 1

## 2018-02-22 MED ORDER — THIAMINE HCL 100 MG/ML IJ SOLN
100.0000 mg | Freq: Every day | INTRAMUSCULAR | Status: DC
Start: 2018-02-22 — End: 2018-02-23
  Administered 2018-02-22: 100 mg via INTRAVENOUS

## 2018-02-22 MED ORDER — ACETAMINOPHEN 650 MG RE SUPP
650.0000 mg | Freq: Four times a day (QID) | RECTAL | Status: DC | PRN
Start: 2018-02-22 — End: 2018-03-01

## 2018-02-22 MED ORDER — IOPAMIDOL (ISOVUE-370) INJECTION 76%
100.0000 mL | Freq: Once | INTRAVENOUS | Status: AC | PRN
  Administered 2018-02-22: 100 mL via INTRAVENOUS

## 2018-02-22 MED ORDER — IPRATROPIUM-ALBUTEROL 0.5-2.5 (3) MG/3ML IN SOLN
3.0000 mL | Freq: Four times a day (QID) | RESPIRATORY_TRACT | Status: DC
  Administered 2018-02-22 – 2018-02-26 (×17): 3 mL via RESPIRATORY_TRACT
  Filled 2018-02-22 (×15): qty 3

## 2018-02-22 MED ORDER — SODIUM CHLORIDE 0.9 % IV SOLN
1.0000 g | Freq: Three times a day (TID) | INTRAVENOUS | Status: DC
  Administered 2018-02-22 – 2018-02-24 (×7): 1 g via INTRAVENOUS
  Filled 2018-02-22 (×8): qty 1

## 2018-02-22 MED ORDER — SODIUM CHLORIDE 0.9 % IV BOLUS
2000.0000 mL | Freq: Once | INTRAVENOUS | Status: AC
  Administered 2018-02-22: 2000 mL via INTRAVENOUS

## 2018-02-22 MED ORDER — IOPAMIDOL (ISOVUE-370) INJECTION 76%
INTRAVENOUS | Status: AC
  Filled 2018-02-22: qty 100

## 2018-02-22 MED ORDER — PIPERACILLIN-TAZOBACTAM 3.375 G IVPB 30 MIN
3.3750 g | Freq: Once | INTRAVENOUS | Status: AC
  Administered 2018-02-22: 3.375 g via INTRAVENOUS
  Filled 2018-02-22: qty 50

## 2018-02-22 MED ORDER — SODIUM CHLORIDE 0.9 % IV BOLUS
1000.0000 mL | Freq: Once | INTRAVENOUS | Status: AC
  Administered 2018-02-22: 1000 mL via INTRAVENOUS

## 2018-02-22 MED ORDER — PANTOPRAZOLE SODIUM 40 MG IV SOLR
40.0000 mg | Freq: Every day | INTRAVENOUS | Status: DC
  Administered 2018-02-22: 40 mg via INTRAVENOUS
  Filled 2018-02-22 (×3): qty 40

## 2018-02-22 MED ORDER — INSULIN ASPART 100 UNIT/ML ~~LOC~~ SOLN
1.0000 [IU] | SUBCUTANEOUS | Status: DC
  Administered 2018-02-22 (×2): 1 [IU] via SUBCUTANEOUS

## 2018-02-22 MED ORDER — MAGNESIUM SULFATE 2 GM/50ML IV SOLN
2.0000 g | Freq: Once | INTRAVENOUS | Status: AC
  Administered 2018-02-22: 2 g via INTRAVENOUS
  Filled 2018-02-22: qty 50

## 2018-02-22 MED ORDER — FOLIC ACID 5 MG/ML IJ SOLN
1.0000 mg | Freq: Every day | INTRAMUSCULAR | Status: DC
  Filled 2018-02-22: qty 0.2

## 2018-02-22 MED ORDER — SODIUM BICARBONATE 8.4 % IV SOLN
INTRAVENOUS | Status: DC
  Administered 2018-02-22: 06:00:00 via INTRAVENOUS
  Filled 2018-02-22 (×2): qty 850

## 2018-02-22 MED ORDER — POTASSIUM CHLORIDE 10 MEQ/100ML IV SOLN
10.0000 meq | INTRAVENOUS | Status: AC
  Administered 2018-02-22 (×4): 10 meq via INTRAVENOUS
  Filled 2018-02-22 (×3): qty 100

## 2018-02-22 MED ORDER — FOLIC ACID 5 MG/ML IJ SOLN
1.0000 mg | Freq: Every day | INTRAMUSCULAR | Status: DC
  Administered 2018-02-22: 1 mg via INTRAVENOUS
  Filled 2018-02-22: qty 0.2

## 2018-02-22 NOTE — Progress Notes (Signed)
Desaturation to 70's. Patient not in any obvious distress on HFNC. Respiratory to bedside and placed back on BiPAP. O2 back up to 100% and patient in no obvious distress. Still tachypneic. MD aware.

## 2018-02-22 NOTE — Significant Event (Signed)
Critical Care Attending.  Admitted for acute hypoxic respiratory failure.  Had been improving since last admission but suddenly became SOB with NPC yesterday.  Presently breathing at 50/min.  However, patient able to speak in full sentences. Minimal accessory muscle use with no abdominal paradox.  Diffuse crackles bilaterally. JVP is not elevated. CT scan personally reviewed shows bilateral interstitial disease.   She is not keen on being intubated at this time and hemodynamics and oxygenation are acceptable. Given pure hypoxic respiratory failure, will switch to HFNC.  CRITICAL CARE Performed by: Kipp Brood   Total critical care time: 30 minutes  Critical care time was exclusive of separately billable procedures and treating other patients.  Critical care was necessary to treat or prevent imminent or life-threatening deterioration.  Critical care was time spent personally by me on the following activities: development of treatment plan with patient and/or surrogate as well as nursing, discussions with consultants, evaluation of patient's response to treatment, examination of patient, obtaining history from patient or surrogate, ordering and performing treatments and interventions, ordering and review of laboratory studies, ordering and review of radiographic studies, pulse oximetry and re-evaluation of patient's condition.

## 2018-02-22 NOTE — Progress Notes (Signed)
Pharmacy Antibiotic Note  Natalie Morrison is a 47 y.o. female admitted on 02/22/2018 with sepsis.  Pharmacy has been consulted for meropenem and vancomyicn dosing. Vanc 1gm given earlier today  Plan: Cont vancomycin 750 mg IV q12 hours Meropenem 1gm IV q8 hours F/u renal function, cultures and clinical course  Height: 5\' 6"  (167.6 cm) Weight: 120 lb (54.4 kg) IBW/kg (Calculated) : 59.3  Temp (24hrs), Avg:100.9 F (38.3 C), Min:98.4 F (36.9 C), Max:103.8 F (39.9 C)  Recent Labs  Lab 02/16/18 0346 02/22/18 0138 02/22/18 0154 02/22/18 0346  WBC 17.5* 42.1*  --   --   CREATININE 0.58 0.71  --   --   LATICACIDVEN  --   --  4.01* 1.08    Estimated Creatinine Clearance: 73.9 mL/min (by C-G formula based on SCr of 0.71 mg/dL).    Allergies  Allergen Reactions  . Nitrofurantoin Hives  . Other Other (See Comments)    Allergies mold, dust per allergy test  . Sulfonamide Derivatives Other (See Comments)    Unknown allergic reaction per husband     Thank you for allowing pharmacy to be a part of this patient's care.  Excell Seltzer Poteet 02/22/2018 4:47 AM

## 2018-02-22 NOTE — ED Provider Notes (Signed)
[facility] EMERGENCY DEPARTMENT Provider Note   CSN: [id number] Arrival date & time: 02/22/18  0115     History   Chief Complaint Chief Complaint  Patient presents with  . Shortness of Breath  . Chest Pain    HPI Natalie Morrison is a 47 y.o. female.  47 yo F with a chief complaint of shortness of breath.  This been going on for the past 24 hours.  The patient was recently in the hospital for multifocal pneumonia.  She was discharged earlier this week and had been doing well until this morning.  Having subjective fevers and chills all day and then worsening shortness of breath throughout the evening.  Has had worsening cough as well.  Denies sick contacts.  Denies IV drug abuse.  Patient quit smoking and drinking alcohol since her last visit in the hospital.  The history is provided by the patient and the spouse.  Shortness of Breath  This is a recurrent problem. The average episode lasts 1 day. The problem occurs continuously.The current episode started 12 to 24 hours ago. The problem has not changed since onset.Associated symptoms include a fever and cough. Pertinent negatives include no headaches, no rhinorrhea, no wheezing, no chest pain and no vomiting. She has tried nothing for the symptoms. The treatment provided no relief. She has had prior hospitalizations. She has had prior ED visits. She has had prior ICU admissions. Associated medical issues include pneumonia.  Chest Pain   Associated symptoms include cough, a fever and shortness of breath. Pertinent negatives include no dizziness, no headaches, no nausea, no palpitations and no vomiting.    Past Medical History:  Diagnosis Date  . Alcohol abuse   . Anemia   . Anxiety   . Anxiety and depression   . Asthma   . Depression   . Hepatitis A    "when I was a kid"  . Hypertension     Patient Active Problem List   Diagnosis Date Noted  . Airway intubation performed without difficulty   . Sepsis due to  pneumonia ([place]) 01/27/2018  . Acute respiratory failure with hypoxia ([place]) 01/27/2018  . Hypokalemia 01/27/2018  . Prolonged QT interval 01/27/2018  . Acute respiratory failure with hypoxia and hypercapnia ([place]) 01/27/2018  . Hypomagnesemia 01/27/2018  . Community acquired pneumonia   . Hyponatremia 06/23/2017  . Pancreatitis 10/30/2016  . Acute pancreatitis 10/30/2016  . Tobacco abuse 10/30/2016  . Moderate malnutrition ([place]) 12/03/2015  . Hypotension 09/23/2015  . Acute kidney injury ([place]) 09/23/2015  . Septic shock ([place]) 09/21/2015  . History of depression 06/14/2015  . Asthma exacerbation 04/07/2011  . HEMATURIA UNSPECIFIED 07/25/2009  . Alcohol abuse 06/26/2009  . ALLERGIC RHINITIS 06/26/2009  . Fatigue 06/26/2009  . Essential hypertension 06/28/2007    Past Surgical History:  Procedure Laterality Date  . BREAST LUMPECTOMY Right   . TONSILLECTOMY AND ADENOIDECTOMY Bilateral over 30 years ago     OB History   None      Home Medications    Prior to Admission medications   Medication Sig Start Date End Date Taking? Authorizing Provider  albuterol (PROVENTIL,VENTOLIN) 90 MCG/ACT inhaler Inhale 2 puffs into the lungs every 6 (six) hours as needed for wheezing or shortness of breath. Reported on 01/02/2016   Yes [provider]  cetirizine (ZYRTEC) 10 MG chewable tablet Chew 10 mg by mouth at bedtime.    Yes [provider]  fluconazole (DIFLUCAN) 200 MG tablet Take 1 tablet (  200 mg total) by mouth daily for 14 days. 02/16/18 03/02/18 Yes Amin, Ankit Chirag, MD  fluticasone (FLONASE) 50 MCG/ACT nasal spray Place 1 spray into both nostrils daily.   Yes [provider]  folic acid (FOLVITE) 1 MG tablet Take 1 tablet (1 mg total) by mouth daily. 02/17/18 03/19/18 Yes Amin, Jeanella Flattery, MD  metoprolol tartrate (LOPRESSOR) 25 MG tablet Take 1 tablet (25 mg total) by mouth 2 (two) times daily. 02/16/18 03/18/18 Yes Amin, Jeanella Flattery, MD  Multiple Vitamin  (MULTIVITAMIN WITH MINERALS) TABS tablet Take 1 tablet by mouth daily. One a Day Women's   Yes [provider]  vitamin B-12 1000 MCG tablet Take 1 tablet (1,000 mcg total) by mouth daily. 11/02/16  Yes Lavina Hamman, MD  furosemide (LASIX) 20 MG tablet TAKE 1 TABLET BY MOUTH AS NEEDED FOR SWELLING. Patient not taking: Reported on 02/22/2018 12/03/17   Eulas Post, MD    Family History Family History  Problem Relation Age of Onset  . Heart disease Mother   . Liver disease Mother   . Heart disease Father     Social History Social History   Tobacco Use  . Smoking status: Current Every Day Smoker    Packs/day: 0.50    Years: 33.00    Pack years: 16.50    Types: Cigarettes  . Smokeless tobacco: Never Used  Substance Use Topics  . Alcohol use: Yes    Alcohol/week: 4.2 oz    Types: 7 Shots of liquor per week  . Drug use: Yes    Frequency: 1.0 times per week    Types: Marijuana    Comment: 10/30/2016 "weekly"     Allergies   Nitrofurantoin; Other; and Sulfonamide derivatives   Review of Systems Review of Systems  Constitutional: Positive for chills and fever.  HENT: Positive for congestion. Negative for rhinorrhea.   Eyes: Negative for redness and visual disturbance.  Respiratory: Positive for cough and shortness of breath. Negative for wheezing.   Cardiovascular: Negative for chest pain and palpitations.  Gastrointestinal: Negative for nausea and vomiting.  Genitourinary: Negative for dysuria and urgency.  Musculoskeletal: Negative for arthralgias and myalgias.  Skin: Negative for pallor and wound.  Neurological: Negative for dizziness and headaches.     Physical Exam Updated Vital Signs BP 110/67   Pulse (!) 111   Temp (!) 103.8 F (39.9 C) (Rectal)   Resp (!) 29   Ht 5\' 6"  (1.676 m)   Wt 54.4 kg (120 lb)   SpO2 (S) (!) 87% Comment: placed on 4L, up to 94%  BMI 19.37 kg/m   Physical Exam  Constitutional: She is oriented to person, place, and  time. She appears well-developed and well-nourished. No distress.  Diffuse muscle wasting  HENT:  Head: Normocephalic and atraumatic.  Eyes: Pupils are equal, round, and reactive to light. EOM are normal.  Neck: Normal range of motion. Neck supple.  Cardiovascular: Normal rate and regular rhythm. Exam reveals no gallop and no friction rub.  No murmur heard. Pulmonary/Chest: She has no wheezes. She has no rales.  Tachypnea  Abdominal: Soft. She exhibits no distension and no mass. There is no tenderness. There is no guarding.  Musculoskeletal: She exhibits no edema or tenderness.  Neurological: She is alert and oriented to person, place, and time.  Skin: Skin is warm and dry. She is not diaphoretic.  Psychiatric: She has a normal mood and affect. Her behavior is normal.  Nursing note and vitals reviewed.  ED Treatments / Results  Labs (all labs ordered are listed, but only abnormal results are displayed) Labs Reviewed  COMPREHENSIVE METABOLIC PANEL - Abnormal; Notable for the following components:      Result Value   Sodium 133 (*)    Potassium 2.9 (*)    CO2 14 (*)    Glucose, Bld 159 (*)    BUN 5 (*)    Total Protein 6.4 (*)    Albumin 2.9 (*)    Anion gap 17 (*)    All other components within normal limits  CBC WITH DIFFERENTIAL/PLATELET - Abnormal; Notable for the following components:   WBC 42.1 (*)    RBC 3.25 (*)    Hemoglobin 10.9 (*)    HCT 32.4 (*)    Platelets 444 (*)    Neutro Abs 37.1 (*)    Monocytes Absolute 2.5 (*)    All other components within normal limits  I-STAT CG4 LACTIC ACID, ED - Abnormal; Notable for the following components:   Lactic Acid, Venous 4.01 (*)    All other components within normal limits  I-STAT BETA HCG BLOOD, ED ([facility], [facility], [facility] ONLY) - Abnormal; Notable for the following components:   I-stat hCG, quantitative 6.6 (*)    All other components within normal limits  CULTURE, BLOOD (ROUTINE X 2)  CULTURE, BLOOD (ROUTINE X 2)  URINE  CULTURE  PROTIME-INR  URINALYSIS, ROUTINE W REFLEX MICROSCOPIC  I-STAT TROPONIN, ED    EKG EKG Interpretation  Date/Time:  Tuesday Feb 22 2018 01:20:46 EDT Ventricular Rate:  144 PR Interval:  116 QRS Duration: 78 QT Interval:  286 QTC Calculation: 442 R Axis:   0 Text Interpretation:  Sinus tachycardia Right atrial enlargement Borderline ECG Since last tracing rate faster Confirmed by Deno Etienne [id number]) on 02/22/2018 2:32:35 AM   Radiology Dg Chest Portable 1 View  Result Date: 02/22/2018 CLINICAL DATA:  Initial evaluation for acute shortness of breath, recent pneumonia. EXAM: PORTABLE CHEST 1 VIEW COMPARISON:  Prior radiograph from 02/08/2018 FINDINGS: Cardiac and mediastinal silhouettes are stable in size and contour, and remain within normal limits. A left PICC catheter has been removed since previous. Elevation of the left hemidiaphragm. Previously seen multifocal bilateral airspace opacities are improved within the right lung, but persist within the left mid and lower lung, relatively similar appearance. No pulmonary edema or pleural effusion. No pneumothorax. Osseous structures unchanged. Mild gaseous distension of the colon within the left upper quadrant noted. IMPRESSION: 1. Persistent patchy opacity within the mid and lower lung, overall similar to most recent radiograph from 02/08/2018. While this finding may reflect sequelae of recently treated pneumonia, persistent and/or recurrent pneumonia could be considered in the correct clinical setting. 2. Additional previously seen bilateral airspace opacities otherwise improved from previous. Electronically Signed   By: Jeannine Boga M.D.   On: 02/22/2018 02:15    Procedures Procedures (including critical care time)  Medications Ordered in ED Medications  vancomycin (VANCOCIN) IVPB 1000 mg/200 mL premix (1,000 mg Intravenous New Bag/Given 02/22/18 0159)  sodium chloride 0.9 % bolus 2,000 mL (2,000 mLs Intravenous New  Bag/Given 02/22/18 0159)  acetaminophen (TYLENOL) tablet 650 mg (650 mg Oral Given 02/22/18 0159)  piperacillin-tazobactam (ZOSYN) IVPB 3.375 g (3.375 g Intravenous New Bag/Given 02/22/18 0159)     Initial Impression / Assessment and Plan / ED Course  I have reviewed the triage vital signs and the nursing notes.  Pertinent labs & imaging results that were available during my care of the patient  were reviewed by me and considered in my medical decision making (see chart for details).     47 yo F with a chief complaint of fevers and chills and shortness of breath.  She was recently discharged from the hospital for multifocal pneumonia, she had been doing better until today.  Patient is significantly tachypneic has a temperature of 104 rectally, chest x-ray concerning for recurrence of pneumonia.  Started on broad-spectrum antibiotics.  Lactate is 4.  Given 30 cc/kg of IV fluids.  Heart rate improved with Tylenol and fluids.  Will discuss with hospitalist for admission.  Sepsis - Repeat Assessment  Performed at:    0230  Vitals     Blood pressure 110/67, pulse (!) 111, temperature (!) 103.8 F (39.9 C), temperature source Rectal, resp. rate (!) 29, height 5\' 6"  (1.676 m), weight 54.4 kg (120 lb), SpO2 (S) (!) 87 %.  Heart:     Tachycardic  Lungs:    CTA  Capillary Refill:   <2 sec  Peripheral Pulse:   Radial pulse palpable  Skin:     Normal Color     CRITICAL CARE Performed by: Cecilio Asper   Total critical care time: 80 minutes  Critical care time was exclusive of separately billable procedures and treating other patients.  Critical care was necessary to treat or prevent imminent or life-threatening deterioration.  Critical care was time spent personally by me on the following activities: development of treatment plan with patient and/or surrogate as well as nursing, discussions with consultants, evaluation of patient's response to treatment, examination of patient,  obtaining history from patient or surrogate, ordering and performing treatments and interventions, ordering and review of laboratory studies, ordering and review of radiographic studies, pulse oximetry and re-evaluation of patient's condition.  The patients results and plan were reviewed and discussed.   Any x-rays performed were independently reviewed by myself.   Differential diagnosis were considered with the presenting HPI.  Medications  vancomycin (VANCOCIN) IVPB 1000 mg/200 mL premix (1,000 mg Intravenous New Bag/Given 02/22/18 0159)  sodium chloride 0.9 % bolus 2,000 mL (2,000 mLs Intravenous New Bag/Given 02/22/18 0159)  acetaminophen (TYLENOL) tablet 650 mg (650 mg Oral Given 02/22/18 0159)  piperacillin-tazobactam (ZOSYN) IVPB 3.375 g (3.375 g Intravenous New Bag/Given 02/22/18 0159)    Vitals:   02/22/18 0125 02/22/18 0140 02/22/18 0200 02/22/18 0215  BP: 93/70  110/73 110/67  Pulse: (!) 141  (!) 115 (!) 111  Resp: (!) 25  (!) 42 (!) 29  Temp: 98.4 F (36.9 C) (!) 103.8 F (39.9 C)    TempSrc: Oral Rectal    SpO2: (S) (!) 87%     Weight: 54.4 kg (120 lb)     Height: 5\' 6"  (1.676 m)       Final diagnoses:  HCAP (healthcare-associated pneumonia)    Admission/ observation were discussed with the admitting physician, patient and/or family and they are comfortable with the plan.    Final Clinical Impressions(s) / ED Diagnoses   Final diagnoses:  HCAP (healthcare-associated pneumonia)    ED Discharge Orders    None       Deno Etienne, DO 02/22/18 0234

## 2018-02-22 NOTE — Care Management Note (Signed)
Case Management Note  Patient Details  Name: Natalie Morrison MRN: [id number] Date of Birth: 09-Oct-1970  Subjective/Objective:   Pt admitted with RF                 Action/Plan:   PTA from home with spouse.  Pt was recently discharged home with [place], PT and OT - agency aware of admit.  CM will continue to follow for discharge needs   Expected Discharge Date:                  Expected Discharge Plan:     In-House Referral:     Discharge planning Services     Post Acute Care Choice:    Choice offered to:     DME Arranged:    DME Agency:     HH Arranged:    HH Agency:     Status of Service:     If discussed at [employer] of Stay Meetings, dates discussed:    Additional Comments:  Maryclare Labrador, RN 02/22/2018, 4:15 PM

## 2018-02-22 NOTE — Progress Notes (Signed)
[facility] Progress Note Patient Name: Natalie Morrison DOB: Jun 04, 1971 MRN: [id number]   Date of Service  02/22/2018  HPI/Events of Note  Patient requests a Nicoderm patch.   eICU Interventions  Will order: 1. Nicoderm patch 21 mg to skin Q day.     Intervention Category Major Interventions: Other:  Lysle Dingwall 02/22/2018, 11:00 PM

## 2018-02-22 NOTE — Progress Notes (Signed)
  Echocardiogram 2D Echocardiogram has been performed.  Natalie Morrison F 02/22/2018, 10:19 AM

## 2018-02-22 NOTE — ED Triage Notes (Signed)
Pt reports shortness of breath and "lung pain" onset tonight, reports feeling ho and cold chills. Recently in hospital for pneumonia.

## 2018-02-22 NOTE — H&P (Signed)
Name: Natalie Morrison MRN: [id number] DOB: 1971-03-23    ADMISSION DATE:  02/22/2018 CONSULTATION DATE:  5/21  REFERRING MD :  TRH  CHIEF COMPLAINT:  Respiratory Distress  HISTORY OF PRESENT ILLNESS:   47 year old female with PMH of alcohol abuse, asthma, anxiety and depression presenting with shortness of breath and fever.  Recently hospitalized from 4/25 to 1/75, course complicated by pneumonia, ARDS, UTI, and alcohol withdrawal.  She was reintubated several times for self extubation and stridor.  Her blood cultures and cultures from BAL on 4/29 were negative.  She was discharged home on room air with cipro and diflucan.    She reports she was feeling well except using her inhaler more since 5/18.  She reported fever, chills, nonproductive cough, congestion, ear pain, and increasing shortness of breath since yesterday.  Additionally, she denies smoking, but states she has had a few alcoholic drinks at home, including yesterday, because she thought she could possibly be withdrawing.   In the ER, she was febrile, 103.8, borderline hypotensive, tachycardic, tachypneic ranging from 30-40's, and initially hypoxic on room air at 87% requiring ventri mask.  Labs noted for WBC 42.0 with left shift, Hgb 10.9, K 2.9, CO2 14, glucose 159, AG 17, albumin 2.9, protein 6.4, neg troponin x 2, lactic 4.01, which has improved to 1.08 after IVF. UA neg, and CXR appears slightly improved from prior, but still noted for persistent left mid and lower opacities.  Currently patient remains borderline hypotensive despite almost 4L NS and remains significantly tachypneic.   Blood and urine cultures sent and started on vancomycin and zosyn.   PCCM called by [facility] for possible admission versus consult; will admit to ICU.   PAST MEDICAL HISTORY :   has a past medical history of Alcohol abuse, Anemia, Anxiety, Anxiety and depression, Asthma, Depression, Hepatitis A, and Hypertension.  has a past surgical history that  includes Tonsillectomy and adenoidectomy (Bilateral, over 30 years ago) and Breast lumpectomy (Right). Prior to Admission medications   Medication Sig Start Date End Date Taking? Authorizing Provider  albuterol (PROVENTIL,VENTOLIN) 90 MCG/ACT inhaler Inhale 2 puffs into the lungs every 6 (six) hours as needed for wheezing or shortness of breath. Reported on 01/02/2016   Yes [provider]  cetirizine (ZYRTEC) 10 MG chewable tablet Chew 10 mg by mouth at bedtime.    Yes [provider]  fluconazole (DIFLUCAN) 200 MG tablet Take 1 tablet (200 mg total) by mouth daily for 14 days. 02/16/18 03/02/18 Yes Amin, Ankit Chirag, MD  fluticasone (FLONASE) 50 MCG/ACT nasal spray Place 1 spray into both nostrils daily.   Yes [provider]  folic acid (FOLVITE) 1 MG tablet Take 1 tablet (1 mg total) by mouth daily. 02/17/18 03/19/18 Yes Amin, Jeanella Flattery, MD  metoprolol tartrate (LOPRESSOR) 25 MG tablet Take 1 tablet (25 mg total) by mouth 2 (two) times daily. 02/16/18 03/18/18 Yes Amin, Jeanella Flattery, MD  Multiple Vitamin (MULTIVITAMIN WITH MINERALS) TABS tablet Take 1 tablet by mouth daily. One a Day Women's   Yes [provider]  vitamin B-12 1000 MCG tablet Take 1 tablet (1,000 mcg total) by mouth daily. 11/02/16  Yes Lavina Hamman, MD  furosemide (LASIX) 20 MG tablet TAKE 1 TABLET BY MOUTH AS NEEDED FOR SWELLING. Patient not taking: Reported on 02/22/2018 12/03/17   Eulas Post, MD   Allergies  Allergen Reactions  . Nitrofurantoin Hives  . Other Other (See Comments)    Allergies mold, dust  per allergy test  . Sulfonamide Derivatives Other (See Comments)    Unknown allergic reaction per husband     FAMILY HISTORY:  family history includes Heart disease in her father and mother; Liver disease in her mother. SOCIAL HISTORY:  reports that she has been smoking cigarettes.  She has a 16.50 pack-year smoking history. She has never used smokeless tobacco. She reports  that she drinks about 4.2 oz of alcohol per week. She reports that she has current or past drug history. Drug: Marijuana. Frequency: 1.00 time per week.  REVIEW OF SYSTEMS:  POSITIVES IN BOLD Gen: Denies fever, chills, weight change, fatigue, night sweats HEENT: Denies vision changes, ear pain, sinus congestion, rhinorrhea, sore throat, neck stiffness, dysphagia PULM: Denies shortness of breath, cough, sputum production, hemoptysis, wheezing CV: Denies chest pain with coughing, edema, orthopnea, palpitations GI: Denies RUQ abdominal pain, nausea, vomiting, diarrhea, change in bowel habits GU: Denies dysuria, hematuria, polyuria, oliguria Endocrine: Denies polyuria, polyphagia or appetite change Derm: Denies rash, dry skin, scaling or peeling skin change Heme: Denies easy bruising, bleeding Neuro: Denies headache, numbness, weakness, slurred speech, loss of memory or consciousness  SUBJECTIVE:   VITAL SIGNS: Temp:  [98.4 F (36.9 C)-103.8 F (39.9 C)] 100.6 F (38.1 C) (05/21 0306) Pulse Rate:  [98-141] 98 (05/21 0420) Resp:  [25-49] 38 (05/21 0420) BP: (88-114)/(59-73) 91/59 (05/21 0420) SpO2:  [86 %-98 %] 98 % (05/21 0420) FiO2 (%):  [55 %] 55 % (05/21 0335) Weight:  [120 lb (54.4 kg)] 120 lb (54.4 kg) (05/21 0125)  PHYSICAL EXAMINATION: General:  Ill appearing adult female sitting upright in bed in mild distress HEENT: MM pink/dry, pupils equal/reactive Neuro: alert, oriented x3, MAE, non-focal CV:  ST 103, rrr, no m/r/g PULM: even/labored, tachpneic in the 40's, dry rales bilaterally, no wheeze, speaks short sentences GI: soft, mild tenderness RUQ, bs active  Extremities: warm/dry, no peripheral edema  Skin: no rashes    Recent Labs  Lab 02/16/18 0346 02/22/18 0138  NA 137 133*  K 3.4* 2.9*  CL 104 102  CO2 23 14*  BUN 20 5*  CREATININE 0.58 0.71  GLUCOSE 120* 159*   Recent Labs  Lab 02/16/18 0346 02/22/18 0138  HGB 10.8* 10.9*  HCT 33.4* 32.4*  WBC 17.5*  42.1*  PLT 544* 444*    STUDIES:  5/21 CXR  1. Persistent patchy opacity within the mid and lower lung, overall similar to most recent radiograph from 02/08/2018. While this finding may reflect sequelae of recently treated pneumonia, persistent and/or recurrent pneumonia could be considered in the correct clinical setting. 2. Additional previously seen bilateral airspace opacities otherwise improved from previous  CULTURES: 5/21 BC x2 >> 5/21 UC >> 5/21 RVP >>  ANTIBIOTICS: 5/21 zosyn x 1 5/21 vanc >> 5/21 meropenem >>  SIGNIFICANT EVENTS: 4/25- 5/15 admitted 5/21- admitted  LINES/TUBES: PIV x 2  DISCUSSION: 25 yoF recent hospitalized with PNA/ARDS/ UTI/ ETOH withdrawals discharged on 5/15 presenting back with SOB, fever, and leukocytosis. CXR showing   ASSESSMENT / PLAN:  PULMONARY A: Acute hypoxic respiratory insufficiency R/o HCAP vs pneumonitis vs ?ILD process - recently treated for PNA/ ARDS  Hx Asthma P:   High risk for intubation, admit to ICU Continue to wean supplemental O2 as able for sats > 92% Prn BiPAP as needed for increased WOB See ID CTA chest pending Sending autoimmune labs- RF, ANCA, ANA,  Trend CXR and ABG  CARDIOVASCULAR A:  SIRS P:  Tele monitoring Goal MAP >65 S/p  4L of NS, add neo for goal MAP > 65 if needed Assess cortisol, BNP  RENAL A:   Hypokalemia Lactic acid- resolved  P:   KCL replete now Assess mag and phos Trend BMP /mag/ phos/ daily wt/ urinary output Replace electrolytes as indicated  GASTROINTESTINAL A:   Hx of prior dysphagia 2 diet on recent discharge per SLP - lfts wnl P:   Check lipase NPO for now PPI   HEMATOLOGIC A:   Anemia- chronic, stable P:  Trend CBC SQ heparin and SCDs for VTE  INFECTIOUS A:   Leukocytosis R/o HCAP P:   Assess PCT, RVP Sputum cx if able Follow blood and urine cultures Continue vancomycin D/c zosyn, start meropenem Trend WBC and fever curve Tylenol PRN    ENDOCRINE A:   Hyperglycemia   P:   CBG q 4 SSI  NEUROLOGIC A:   ETOH abuse - last drink 5/20 Hx anxiety / depression - complained of HA in ER P:   CTH pending Monitor  Thiamine and folate daily  UDS pending   FAMILY  - Updates: Patient and husband at bedside updated on plan of care  - Inter-disciplinary family meet or Palliative Care meeting due by: 5/28  Kennieth Rad, AGACNP-BC [place] Pgr: [phone] or if no answer [phone] 02/22/2018, 5:21 AM

## 2018-02-23 ENCOUNTER — Inpatient Hospital Stay: Payer: BLUE CROSS/BLUE SHIELD | Admitting: Family Medicine

## 2018-02-23 LAB — URINE CULTURE: Culture: NO GROWTH

## 2018-02-23 LAB — CENTROMERE ANTIBODIES: Centromere Ab Screen: <0.2 AI (ref 0.0–0.9)

## 2018-02-23 LAB — BASIC METABOLIC PANEL
Anion gap: 8 (ref 5–15)
BUN: <5 mg/dL — ABNORMAL LOW (ref 6–20)
CO2: 23 mmol/L (ref 22–32)
Calcium: 8.3 mg/dL — ABNORMAL LOW (ref 8.9–10.3)
Chloride: 108 mmol/L (ref 101–111)
Creatinine, Ser: 0.56 mg/dL (ref 0.44–1.00)
GFR calc Af Amer: >60 mL/min (ref >60)
GFR calc non Af Amer: >60 mL/min (ref >60)
Glucose, Bld: 99 mg/dL (ref 65–99)
Potassium: 3 mmol/L — ABNORMAL LOW (ref 3.5–5.1)
Sodium: 139 mmol/L (ref 135–145)

## 2018-02-23 LAB — GLUCOSE, CAPILLARY
Glucose-Capillary: 117 mg/dL — ABNORMAL HIGH (ref 65–99)
Glucose-Capillary: 91 mg/dL (ref 65–99)
Glucose-Capillary: 111 mg/dL — ABNORMAL HIGH (ref 65–99)
Glucose-Capillary: 106 mg/dL — ABNORMAL HIGH (ref 65–99)
Glucose-Capillary: 137 mg/dL — ABNORMAL HIGH (ref 65–99)
Glucose-Capillary: 114 mg/dL — ABNORMAL HIGH (ref 65–99)

## 2018-02-23 LAB — ANA W/REFLEX IF POSITIVE: Anti Nuclear Antibody(ANA): NEGATIVE

## 2018-02-23 LAB — MPO/PR-3 (ANCA) ANTIBODIES
ANCA Proteinase 3: <3.5 U/mL (ref 0.0–3.5)
Myeloperoxidase Abs: <9 U/mL (ref 0.0–9.0)

## 2018-02-23 LAB — MRSA CULTURE: Culture: NOT DETECTED

## 2018-02-23 LAB — LEGIONELLA PNEUMOPHILA SEROGP 1 UR AG: L. pneumophila Serogp 1 Ur Ag: NEGATIVE

## 2018-02-23 LAB — RHEUMATOID FACTOR: Rhuematoid fact SerPl-aCnc: <10 IU/mL (ref 0.0–13.9)

## 2018-02-23 LAB — ANTI-SCLERODERMA ANTIBODY: Scleroderma (Scl-70) (ENA) Antibody, IgG: <0.2 AI (ref 0.0–0.9)

## 2018-02-23 LAB — PROCALCITONIN: Procalcitonin: 0.56 ng/mL

## 2018-02-23 LAB — ANCA TITERS
Atypical P-ANCA titer: <1:20 {titer}
C-ANCA: <1:20 {titer}
P-ANCA: <1:20 {titer}

## 2018-02-23 LAB — ANTI-DNA ANTIBODY, DOUBLE-STRANDED: ds DNA Ab: <1 IU/mL (ref 0–9)

## 2018-02-23 LAB — ANTI-JO 1 ANTIBODY, IGG: Anti JO-1: <0.2 AI (ref 0.0–0.9)

## 2018-02-23 LAB — MAGNESIUM: Magnesium: 1.6 mg/dL — ABNORMAL LOW (ref 1.7–2.4)

## 2018-02-23 MED ORDER — LORAZEPAM 2 MG/ML IJ SOLN
1.0000 mg | Freq: Four times a day (QID) | INTRAMUSCULAR | Status: DC | PRN
  Administered 2018-02-26 – 2018-02-27 (×2): 2 mg via INTRAVENOUS
  Administered 2018-02-28: 1 mg via INTRAVENOUS
  Filled 2018-02-23 (×3): qty 1

## 2018-02-23 MED ORDER — FUROSEMIDE 10 MG/ML IJ SOLN
40.0000 mg | Freq: Once | INTRAMUSCULAR | Status: AC
  Administered 2018-02-23: 40 mg via INTRAVENOUS
  Filled 2018-02-23: qty 4

## 2018-02-23 MED ORDER — FOLIC ACID 1 MG PO TABS
1.0000 mg | ORAL_TABLET | Freq: Every day | ORAL | Status: DC
Start: 2018-02-23 — End: 2018-03-01
  Administered 2018-02-23 – 2018-03-01 (×7): 1 mg via ORAL
  Filled 2018-02-23 (×9): qty 1

## 2018-02-23 MED ORDER — VITAMIN B-1 100 MG PO TABS
100.0000 mg | ORAL_TABLET | Freq: Every day | ORAL | Status: DC
  Administered 2018-02-23 – 2018-03-01 (×7): 100 mg via ORAL
  Filled 2018-02-23 (×7): qty 1

## 2018-02-23 MED ORDER — POTASSIUM CHLORIDE CRYS ER 20 MEQ PO TBCR
40.0000 meq | EXTENDED_RELEASE_TABLET | Freq: Once | ORAL | Status: AC
  Administered 2018-02-23: 40 meq via ORAL
  Filled 2018-02-23: qty 2

## 2018-02-23 MED ORDER — MORPHINE SULFATE (PF) 2 MG/ML IV SOLN: 1.0000 mg | INTRAVENOUS | Status: DC | PRN

## 2018-02-23 MED ORDER — LORAZEPAM 2 MG/ML IJ SOLN
1.0000 mg | INTRAMUSCULAR | Status: DC
  Administered 2018-02-23 – 2018-02-25 (×4): 1 mg via INTRAVENOUS
  Filled 2018-02-23 (×5): qty 1

## 2018-02-23 MED ORDER — COLLAGENASE 250 UNIT/GM EX OINT
TOPICAL_OINTMENT | Freq: Every day | CUTANEOUS | Status: DC
  Administered 2018-02-23 – 2018-03-01 (×5): via TOPICAL
  Filled 2018-02-23: qty 30

## 2018-02-23 NOTE — Consult Note (Addendum)
[facility] Nurse wound consult note Reason for Consult: Wound staging, currently unstagealbe. Wound type: Related to moisture, friction, shear, pressure.  Located at the top of the gluteal fold. POA: Yes Measurement: 1 cm x 0.3 cm x unknown depth at the time of my assessment in 2M04. Wound bed:  100% yellow slough Drainage (amount, consistency, odor) No wound drainage on sacral foam dressing, no odor Periwound: perimeter of wound is very slightly erythematous, otherwise, intact skin normal color and texture. Dressing procedure/placement/frequency: Apply Santyl to the wound bed. Cover with saline moistened gauze, then dry gauze or ABD pad.  Change daily. Monitor the wound area(s) for worsening of condition such as: Signs/symptoms of infection,  Increase in size,  Development of or worsening of odor, Development of pain, or increased pain at the affected locations.  Notify the medical team if any of these develop.  Thank you for the consult.  Discussed plan of care with the patient and bedside nurse.  [place] nurse will not follow at this time.  Please re-consult the [place] team if needed.  Val Riles, RN, MSN, CWOCN, CNS-BC, pager [phone]

## 2018-02-23 NOTE — Progress Notes (Signed)
Name: Natalie Morrison MRN: [id number] DOB: 1970/11/17    ADMISSION DATE:  02/22/2018 CONSULTATION DATE:  5/21  REFERRING MD :  TRH  CHIEF COMPLAINT:  Respiratory Distress  HISTORY OF PRESENT ILLNESS:   47 year old female with PMH of alcohol abuse, asthma, anxiety and depression presenting with shortness of breath and fever.  Recently hospitalized from 4/25 to 1/54, course complicated by pneumonia, ARDS, UTI, and alcohol withdrawal.  She was reintubated several times for self extubation and stridor.  Her blood cultures and cultures from BAL on 4/29 were negative.  She was discharged home on room air with cipro and diflucan.    She reports she was feeling well except using her inhaler more since 5/18.  She reported fever, chills, nonproductive cough, congestion, ear pain, and increasing shortness of breath since yesterday.  Additionally, she denies smoking, but states she has had a few alcoholic drinks at home, including yesterday, because she thought she could possibly be withdrawing.   In the ER, she was febrile, 103.8, borderline hypotensive, tachycardic, tachypneic ranging from 30-40's, and initially hypoxic on room air at 87% requiring ventri mask.  Labs noted for WBC 42.0 with left shift, Hgb 10.9, K 2.9, CO2 14, glucose 159, AG 17, albumin 2.9, protein 6.4, neg troponin x 2, lactic 4.01, which has improved to 1.08 after IVF. UA neg, and CXR appears slightly improved from prior, but still noted for persistent left mid and lower opacities.  Currently patient remains borderline hypotensive despite almost 4L NS and remains significantly tachypneic.   Blood and urine cultures sent and started on vancomycin and zosyn.   PCCM called by [facility] for possible admission versus consult; will admit to ICU.    SUBJECTIVE:  Remains on HFNC.  WOB normal this AM.    VITAL SIGNS: Temp:  [97.4 F (36.3 C)-98.2 F (36.8 C)] 98.1 F (36.7 C) (05/22 0735) Pulse Rate:  [69-105] 79 (05/22 0753) Resp:  [16-54]  16 (05/22 0753) BP: (89-124)/(52-80) 117/76 (05/22 0753) SpO2:  [78 %-100 %] 96 % (05/22 0753) FiO2 (%):  [40 %-80 %] 40 % (05/22 0753) Weight:  [59.5 kg (131 lb 2.8 oz)] 59.5 kg (131 lb 2.8 oz) (05/22 0500)  PHYSICAL EXAMINATION: General:  Chronically ill appearing adult female sitting upright in bed, in NAD HEENT: MM pink/dry, pupils equal/reactive Neuro: alert, oriented x3, MAE, non-focal CV:  RRR, no M/R/G PULM: Resps even and unlabored.  Crackles bilaterally. On HFNC GI: BS x 4, S/NT/ND Extremities: warm/dry, no peripheral edema  Skin: no rashes    Recent Labs  Lab 02/22/18 0138 02/22/18 0452 02/22/18 1352  NA 133* 138 139  K 2.9* 2.8* 3.7  CL 102 110 112*  CO2 14* 18* 20*  BUN 5* 5* <5*  CREATININE 0.71 0.62 0.52  GLUCOSE 159* 107* 138*   Recent Labs  Lab 02/22/18 0138 02/22/18 0452  HGB 10.9* 9.3*  HCT 32.4* 28.6*  WBC 42.1* 37.2*  PLT 444* 272    STUDIES:  CXR 5/21 > b/l opacities. CTA chest 5/21 > no PE. GGO's likely sequela of ARDS vs edema. Abd [place] 5/21 > no acute process.  CULTURES: 5/21 BC x2 >> 5/21 UC >> 5/21 RVP >>  ANTIBIOTICS: 5/21 zosyn x 1 5/21 vanc >> 5/21 meropenem >>  SIGNIFICANT EVENTS: 4/25- 5/15 admitted 5/21- admitted  LINES/TUBES: PIV x 2  DISCUSSION: 55 yoF recent hospitalized with PNA/ARDS/ UTI/ ETOH withdrawals discharged on 5/15 presenting back with SOB, fever, and leukocytosis.  ASSESSMENT /  PLAN:  PULMONARY A: Acute hypoxic respiratory insufficiency - ? V/Q mismatch from recent ARDS/possible HCAP/pneuomonitis and possible acute edema now R/o HCAP vs pneumonitis vs ?ILD process Hx Asthma P:   Continue to wean supplemental O2 as able for sats > 92% Lasix 41m x 1 and assess response, might need further Morphine low dose for air hunger PRN BiPAP as needed for increased WOB F/u on autoimmune labs- RF, ANCA, ANA Follow CXR intermittently  CARDIOVASCULAR A:  SIRS - multifactorial P:  Tele  monitoring Consider stress steroids (cortisol 9.4)  RENAL A:   Hypokalemia - resolved Lactic acidosis - resolved  P:   Trend BMP /mag/ phos/ daily wt/ urinary output Replace electrolytes as indicated  GASTROINTESTINAL A:   Hx of prior dysphagia 2 diet on recent discharge per SLP P:   Will start back dysphagia 2 diet  HEMATOLOGIC A:   Anemia- chronic, stable VTE prophylaxis P:  Transfuse for Hgb < 7 SQ heparin and SCDs Follow CBC  INFECTIOUS A:   R/o HCAP P:   Continue empiric abx (vanc, merrem) Follow cultures, PCT  ENDOCRINE A:   Hyperglycemia - resolved P:   CBG q 4  NEUROLOGIC A:   ETOH abuse - last drink 5/20 Hx anxiety / depression P:   Thiamine and folate daily Start Ativan scheduled and PRN EtOH cessation counseling   FAMILY  - Updates: Patient and husband at bedside updated on plan of care.  - Inter-disciplinary family meet or Palliative Care meeting due by: 5/28   RMontey Hora PBull Run Mountain EstatesPulmonary & Critical Care Medicine Pager: [phone] or ([phone]/22/2019, 8:47 AM

## 2018-02-24 ENCOUNTER — Inpatient Hospital Stay (HOSPITAL_COMMUNITY): Payer: BLUE CROSS/BLUE SHIELD

## 2018-02-24 LAB — BASIC METABOLIC PANEL
Anion gap: 8 (ref 5–15)
Anion gap: 10 (ref 5–15)
BUN: 7 mg/dL (ref 6–20)
BUN: 8 mg/dL (ref 6–20)
CO2: 26 mmol/L (ref 22–32)
CO2: 28 mmol/L (ref 22–32)
Calcium: 8.5 mg/dL — ABNORMAL LOW (ref 8.9–10.3)
Calcium: 8.6 mg/dL — ABNORMAL LOW (ref 8.9–10.3)
Chloride: 104 mmol/L (ref 101–111)
Chloride: 97 mmol/L — ABNORMAL LOW (ref 101–111)
Creatinine, Ser: 0.52 mg/dL (ref 0.44–1.00)
Creatinine, Ser: 0.54 mg/dL (ref 0.44–1.00)
GFR calc Af Amer: >60 mL/min (ref >60)
GFR calc Af Amer: >60 mL/min (ref >60)
GFR calc non Af Amer: >60 mL/min (ref >60)
GFR calc non Af Amer: >60 mL/min (ref >60)
Glucose, Bld: 113 mg/dL — ABNORMAL HIGH (ref 65–99)
Glucose, Bld: 273 mg/dL — ABNORMAL HIGH (ref 65–99)
Potassium: 3.3 mmol/L — ABNORMAL LOW (ref 3.5–5.1)
Potassium: 4.1 mmol/L (ref 3.5–5.1)
Sodium: 138 mmol/L (ref 135–145)
Sodium: 135 mmol/L (ref 135–145)

## 2018-02-24 LAB — BLOOD GAS, ARTERIAL
Acid-Base Excess: 3.4 mmol/L — ABNORMAL HIGH (ref 0.0–2.0)
Bicarbonate: 26.8 mmol/L (ref 20.0–28.0)
Drawn by: 511911
FIO2: 60
O2 Content: 30 L/min
O2 Saturation: 97.3 %
Patient temperature: 99.5
pCO2 arterial: 37.2 mmHg (ref 32.0–48.0)
pH, Arterial: 7.473 — ABNORMAL HIGH (ref 7.350–7.450)
pO2, Arterial: 91.1 mmHg (ref 83.0–108.0)

## 2018-02-24 LAB — GLUCOSE, CAPILLARY
Glucose-Capillary: 127 mg/dL — ABNORMAL HIGH (ref 65–99)
Glucose-Capillary: 264 mg/dL — ABNORMAL HIGH (ref 65–99)
Glucose-Capillary: 236 mg/dL — ABNORMAL HIGH (ref 65–99)

## 2018-02-24 LAB — MAGNESIUM: Magnesium: 1.4 mg/dL — ABNORMAL LOW (ref 1.7–2.4)

## 2018-02-24 LAB — PROCALCITONIN: Procalcitonin: 0.37 ng/mL

## 2018-02-24 MED ORDER — FUROSEMIDE 10 MG/ML IJ SOLN
60.0000 mg | Freq: Once | INTRAMUSCULAR | Status: AC
  Administered 2018-02-24: 60 mg via INTRAVENOUS
  Filled 2018-02-24: qty 6

## 2018-02-24 MED ORDER — METHYLPREDNISOLONE SODIUM SUCC 125 MG IJ SOLR
60.0000 mg | Freq: Four times a day (QID) | INTRAMUSCULAR | Status: DC
  Administered 2018-02-24 – 2018-02-26 (×9): 60 mg via INTRAVENOUS
  Filled 2018-02-24 (×9): qty 2

## 2018-02-24 MED ORDER — MAGNESIUM SULFATE 2 GM/50ML IV SOLN
2.0000 g | Freq: Once | INTRAVENOUS | Status: AC
  Administered 2018-02-24: 2 g via INTRAVENOUS
  Filled 2018-02-24: qty 50

## 2018-02-24 MED ORDER — AMOXICILLIN-POT CLAVULANATE 600-42.9 MG/5ML PO SUSR
875.0000 mg | Freq: Two times a day (BID) | ORAL | Status: DC
  Administered 2018-02-24 – 2018-02-27 (×7): 876 mg via ORAL
  Filled 2018-02-24 (×7): qty 7.3

## 2018-02-24 MED ORDER — POTASSIUM CHLORIDE CRYS ER 20 MEQ PO TBCR: 20.0000 meq | EXTENDED_RELEASE_TABLET | ORAL | Status: DC

## 2018-02-24 MED ORDER — POTASSIUM CHLORIDE 10 MEQ/100ML IV SOLN
10.0000 meq | INTRAVENOUS | Status: AC
  Administered 2018-02-24 (×2): 10 meq via INTRAVENOUS
  Filled 2018-02-24 (×2): qty 100

## 2018-02-24 MED ORDER — FUROSEMIDE 10 MG/ML IJ SOLN
40.0000 mg | Freq: Two times a day (BID) | INTRAMUSCULAR | Status: DC
Start: 2018-02-24 — End: 2018-03-01
  Administered 2018-02-24 – 2018-02-28 (×9): 40 mg via INTRAVENOUS
  Filled 2018-02-24 (×10): qty 4

## 2018-02-24 MED ORDER — POTASSIUM CHLORIDE CRYS ER 20 MEQ PO TBCR
40.0000 meq | EXTENDED_RELEASE_TABLET | Freq: Once | ORAL | Status: AC
  Administered 2018-02-24: 40 meq via ORAL
  Filled 2018-02-24: qty 2

## 2018-02-24 MED ORDER — POTASSIUM CHLORIDE CRYS ER 20 MEQ PO TBCR
40.0000 meq | EXTENDED_RELEASE_TABLET | Freq: Two times a day (BID) | ORAL | Status: DC
  Administered 2018-02-24 – 2018-02-25 (×2): 40 meq via ORAL
  Filled 2018-02-24 (×2): qty 2

## 2018-02-24 NOTE — Plan of Care (Signed)
  Problem: Education: Goal: Knowledge of General Education information will improve Outcome: Progressing Note:  POC reviewed with pt.- pt. quiet, not talking much.

## 2018-02-24 NOTE — Plan of Care (Signed)
  Problem: Education: Goal: Knowledge of General Education information will improve Outcome: Progressing   

## 2018-02-24 NOTE — Progress Notes (Addendum)
Name: Natalie Morrison MRN: [id number] DOB: 12-Feb-1971    ADMISSION DATE:  02/22/2018 CONSULTATION DATE:  5/21  REFERRING MD :  TRH  CHIEF COMPLAINT:  Respiratory Distress  HISTORY OF PRESENT ILLNESS:   47 year old female with PMH of alcohol abuse, asthma, anxiety and depression presenting with shortness of breath and fever.  Recently hospitalized from 4/25 to 8/10, course complicated by pneumonia, ARDS, UTI, and alcohol withdrawal.  She was reintubated several times for self extubation and stridor.  Her blood cultures and cultures from BAL on 4/29 were negative.  She was discharged home on room air with cipro and diflucan.    She reports she was feeling well except using her inhaler more since 5/18.  She reported fever, chills, nonproductive cough, congestion, ear pain, and increasing shortness of breath since yesterday.  Additionally, she denies smoking, but states she has had a few alcoholic drinks at home, including yesterday, because she thought she could possibly be withdrawing.   In the ER, she was febrile, 103.8, borderline hypotensive, tachycardic, tachypneic ranging from 30-40's, and initially hypoxic on room air at 87% requiring ventri mask.  Labs noted for WBC 42.0 with left shift, Hgb 10.9, K 2.9, CO2 14, glucose 159, AG 17, albumin 2.9, protein 6.4, neg troponin x 2, lactic 4.01, which has improved to 1.08 after IVF. UA neg, and CXR appears slightly improved from prior, but still noted for persistent left mid and lower opacities.  Currently patient remains borderline hypotensive despite almost 4L NS and remains significantly tachypneic.   Blood and urine cultures sent and started on vancomycin and zosyn.   PCCM called by [facility] for possible admission versus consult; will admit to ICU.    SUBJECTIVE:  Had some intermittent hypoxia overnight requiring HFNC @ 100% FiO2 again. This AM, seems comfortable, SpO2 100% on 60%.  Excellent response to lasix challenge yesterday (-2.6L UOP past 24  hrs and now +3.4 net since admit). Of note, SLP informs me that on prior admit, pt was instructed to thicken all meals at home; however, pt reports that she was not doing so.  She would thicken 1 - 2 meals per day but certainly not all.  This certainly raises suspicion for occult aspiration  VITAL SIGNS: Temp:  [97.9 F (36.6 C)-99.5 F (37.5 C)] 98.4 F (36.9 C) (05/23 0720) Pulse Rate:  [82-102] 97 (05/22 1830) Resp:  [17-44] 30 (05/22 1830) BP: (90-109)/(59-88) 97/66 (05/22 1830) SpO2:  [91 %-100 %] 100 % (05/23 0720) FiO2 (%):  [40 %-60 %] 60 % (05/23 0720)  PHYSICAL EXAMINATION: General:  Chronically ill appearing adult female sitting upright in bed watching TV, in NAD HEENT: MM pink/dry, EOMI Neuro: alert, oriented x3, MAE, non-focal CV:  RRR, no M/R/G PULM: Resps even and unlabored.  Crackles bilaterally. On HFNC GI: BS x 4, S/NT/ND Extremities: warm/dry, no peripheral edema  Skin: no rashes    Recent Labs  Lab 02/22/18 1352 02/23/18 0812 02/24/18 0306  NA 139 139 138  K 3.7 3.0* 3.3*  CL 112* 108 104  CO2 20* 23 26  BUN <5* <5* 7  CREATININE 0.52 0.56 0.52  GLUCOSE 138* 99 113*   Recent Labs  Lab 02/22/18 0138 02/22/18 0452  HGB 10.9* 9.3*  HCT 32.4* 28.6*  WBC 42.1* 37.2*  PLT 444* 272    STUDIES:  CXR 5/21 > b/l opacities. CTA chest 5/21 > no PE. GGO's likely sequela of ARDS vs edema. Abd [place] 5/21 > no acute  process. Autoimmune labs:  Ds-DNA neg, anti jo neg, anti Scl neg, RF neg, ANCA neg, ANA neg, HSP panel pending  CULTURES: 5/21 BC x2 >> 5/21 UC >> 5/21 RVP >> neg  ANTIBIOTICS: 5/21 zosyn x 1 5/21 vanc >> 5/23 5/21 meropenem >> 5/23 Augmentin 5/23 >   SIGNIFICANT EVENTS: 4/25- 5/15 admitted 5/21- admitted  LINES/TUBES: PIV x 2  DISCUSSION: 32 yoF recent hospitalized with PNA/ARDS/ UTI/ ETOH withdrawals discharged on 5/15 presenting back with SOB, fever, and leukocytosis.  ASSESSMENT / PLAN:  PULMONARY A: Acute hypoxic  respiratory insufficiency - ? V/Q mismatch from recent ARDS/possible HCAP/pneuomonitis and possible acute edema now.  Favor pneumonitis from occult aspiration (pt admits to not thickening all meals at home) R/o HCAP vs pneumonitis vs ?ILD process Hx Asthma P:   Continue to wean supplemental O2 as able for sats > 92% Excellent response to lasix yesterday 5/22, so will continue today and start 31m now then start 480mBID after that Start solumedrol 6082m6hrs Morphine low dose for air hunger PRN BiPAP as needed for increased WOB Follow CXR intermittently  CARDIOVASCULAR A:  SIRS - multifactorial P:  Tele monitoring Consider stress steroids (cortisol 9.4)  RENAL A:   Hypokalemia  Hypomagnesemia Lactic acidosis - resolved  P:   Replace electrolytes as indicated Follow BMP  GASTROINTESTINAL A:   Hx of prior dysphagia 2 diet on recent discharge per SLP - ? Persistent occult aspirations (pt reports at home she was not thickening all of her meals) P:   SLP eval today at 1000  HEMATOLOGIC A:   Anemia- chronic, stable VTE prophylaxis P:  Transfuse for Hgb < 7 SQ heparin and SCDs Follow CBC  INFECTIOUS A:   R/o HCAP P:   Continue empiric abx (vanc, merrem) Follow cultures, PCT  ENDOCRINE A:   Hyperglycemia - resolved; though expect rise again now with steroids P:   CBG q 4  NEUROLOGIC A:   ETOH abuse - last drink 5/20 Hx anxiety / depression P:   Thiamine and folate daily Continue Ativan scheduled and PRN EtOH cessation counseling   FAMILY  - Updates: Patient and husband at bedside updated on plan of care.  - Inter-disciplinary family meet or Palliative Care meeting due by: 5/28   Will transfer to SDU and ask TRH to assume care starting in AM 5/24.  PCCM will follow for pulmonary / O2 needs.   RahMontey HoraA [facility]lmonary & Critical Care Medicine Pager: [phone] ([phone]/2019, 8:30 AM

## 2018-02-24 NOTE — Progress Notes (Addendum)
O2 sat decreasing 84- 85%, pt. coughing and nonproductive; HOB elevated more; Chris,RN increased pt. to 100% high flow/ 30L; resp. therapy called and Elink called into pt.'s room. O2 sat increasing up and decreased to 60%/ 30L 0036 and O2 sat 94-95 %.

## 2018-02-24 NOTE — Evaluation (Signed)
Clinical/Bedside Swallow Evaluation Patient Details  Name: Natalie Morrison MRN: [id number] Date of Birth: 06/18/1971  Today's Date: 02/24/2018 Time: SLP Start Time (ACUTE ONLY): 3614 SLP Stop Time (ACUTE ONLY): 0846 SLP Time Calculation (min) (ACUTE ONLY): 7 min  Past Medical History:  Past Medical History:  Diagnosis Date  . Alcohol abuse   . Anemia   . Anxiety   . Anxiety and depression   . Asthma   . Depression   . Hepatitis A    "when I was a kid"  . Hypertension    Past Surgical History:  Past Surgical History:  Procedure Laterality Date  . BREAST LUMPECTOMY Right   . TONSILLECTOMY AND ADENOIDECTOMY Bilateral over 30 years ago   HPI:  Pt is a 47 year old female with PMH of alcohol abuse, asthma, anxiety and depression presenting with shortness of breath and fever. She was recently hospitalized from 4/25 to 4/31, course complicated by pneumonia, ARDS, UTI, and alcohol withdrawal.  She was reintubated several times for self extubation and stridor. SLP saw at that time with evidence of post-extubation/cognitive dysphagia with MBS recommending Dys 2 diet, honey thick liquids by spoon.   Assessment / Plan / Recommendation Clinical Impression  Pt is known to this SLP from recent admission. Her voice remains dysphonic although perhaps mildly improved from that time. She has immediate coughing with thin liquids as well as delayed coughing that appears to persist through additional trials of puree. She reports using thickener in her drinks "2 times a day" at home, otherwise drinking liquids thin. Concern for aspiration persists. Recommend proceeding with MBS this morning to better assess oropahryngeal function and look for potential improvements from most recent study. Since this is scheduled for this morning, would hold further POs except for bites of puree, meds in puree pending completion. SLP Visit Diagnosis: Dysphagia, unspecified (R13.10)    Aspiration Risk  Moderate aspiration  risk    Diet Recommendation NPO except meds;Other (Comment)(bites of puree)   Medication Administration: Whole meds with puree    Other  Recommendations Oral Care Recommendations: Oral care QID   Follow up Recommendations        Frequency and Duration            Prognosis Prognosis for Safe Diet Advancement: Good      Swallow Study   General HPI: Pt is a 47 year old female with PMH of alcohol abuse, asthma, anxiety and depression presenting with shortness of breath and fever. She was recently hospitalized from 4/25 to 5/40, course complicated by pneumonia, ARDS, UTI, and alcohol withdrawal.  She was reintubated several times for self extubation and stridor. SLP saw at that time with evidence of post-extubation/cognitive dysphagia with MBS recommending Dys 2 diet, honey thick liquids by spoon. Type of Study: Bedside Swallow Evaluation Previous Swallow Assessment: see HPI Diet Prior to this Study: Dysphagia 2 (chopped);Thin liquids Temperature Spikes Noted: No Respiratory Status: Nasal cannula(HFNC) History of Recent Intubation: No(during last admission - see HPI) Behavior/Cognition: Alert;Cooperative;Pleasant mood Oral Care Completed by SLP: No Oral Cavity - Dentition: Adequate natural dentition Vision: Functional for self-feeding Self-Feeding Abilities: Able to feed self Patient Positioning: Upright in bed Baseline Vocal Quality: Hoarse Volitional Cough: Weak    Oral/Motor/Sensory Function     Ice Chips Ice chips: Not tested   Thin Liquid Thin Liquid: Impaired Presentation: Self Fed;Straw Pharyngeal  Phase Impairments: Cough - Immediate;Cough - Delayed    Nectar Thick Nectar Thick Liquid: Not tested   Honey Thick  Honey Thick Liquid: Not tested   Puree Puree: Impaired Presentation: Self Fed;Spoon Pharyngeal Phase Impairments: Cough - Delayed   Solid   GO   Solid: Not tested        Germain Osgood 02/24/2018,8:54 AM  Germain Osgood, M.A.  CCC-SLP [phone]

## 2018-02-24 NOTE — Progress Notes (Signed)
Patient placed on 45% venturi mask for transport. Sats 98%.

## 2018-02-24 NOTE — Progress Notes (Signed)
[facility] ADULT ICU REPLACEMENT PROTOCOL FOR AM LAB REPLACEMENT ONLY  The patient does apply for the [facility] Adult ICU Electrolyte Replacment Protocol based on the criteria listed below:   1. Is GFR >/= 40 ml/min? Yes.    Patient's GFR today is >60 2. Is urine output >/= 0.5 ml/kg/hr for the last 6 hours? Yes.   Patient's UOP is .7 ml/kg/hr 3. Is BUN < 60 mg/dL? Yes.    Patient's BUN today is 3 4. Abnormal electrolyte(s): K-3.3 5. Ordered repletion with: per protocol 6. If a panic level lab has been reported, has the CCM MD in charge been notified? Yes.  .   Physician:  Dr Jimmy Footman  Christophe Louis, Philis Nettle 02/24/2018 6:11 AM

## 2018-02-24 NOTE — Plan of Care (Signed)
  Problem: Education: Goal: Knowledge of General Education information will improve Outcome: Progressing   Problem: Health Behavior/Discharge Planning: Goal: Ability to manage health-related needs will improve Outcome: Progressing   Problem: Clinical Measurements: Goal: Ability to maintain clinical measurements within normal limits will improve Outcome: Progressing   Problem: Activity: Goal: Risk for activity intolerance will decrease Outcome: Progressing   Problem: Safety: Goal: Ability to remain free from injury will improve Outcome: Progressing

## 2018-02-24 NOTE — Progress Notes (Signed)
Modified Barium Swallow Progress Note  Patient Details  Name: Natalie Morrison MRN: [id number] Date of Birth: [birth year]  Today's Date: 02/24/2018  Modified Barium Swallow completed.  Full report located under Chart Review in the Imaging Section.  Brief recommendations include the following:  Clinical Impression  Pt shows improvements since MBS last admission, likely secondary to improved mentation and increased time post-extubation. Her oral phase is now [facility]. She continues to have small amounts of thin liquids that enter her laryngeal vestibule during the swallow, suspect from decreased glottal closure given that her vocal quality remains hoarse. Liquids contact the vocal folds but are ejected with a delayed cough. It is possible that some liquid could spill further into the trachea before she elicits a cough, but this was not observed today. She achieved better airway protection with no penetration or aspiration when using a chin tuck with thin liquids. Solids also were cleared from her pharynx with good efficiency and airway protection. Recommend that pt eat unrestricted textures and liquids, but would focus more on aspiration precautions and swallowing strategies as outlined below. Would administer pills whole in puree to facilitate esophageal transit, as was needed on testing today. SLP will f/u for tolerance and to increase utilization of strategies.   Swallow Evaluation Recommendations       SLP Diet Recommendations: Regular solids;Thin liquid   Liquid Administration via: Cup;Straw   Medication Administration: Whole meds with puree   Supervision: Patient able to self feed;Full supervision/cueing for compensatory strategies;Comment(supervision for first few meals to learn chin tuck strategy)   Compensations: Slow rate;Small sips/bites;Chin tuck;Other (Comment)(rest breaks PRN for respirations)   Postural Changes: Seated upright at 90 degrees;Remain semi-upright after after feeds/meals  (Comment)   Oral Care Recommendations: Oral care BID        Germain Osgood 02/24/2018,11:07 AM   Germain Osgood, M.A. CCC-SLP [phone]

## 2018-02-25 ENCOUNTER — Other Ambulatory Visit: Payer: Self-pay

## 2018-02-25 DIAGNOSIS — F419 Anxiety disorder, unspecified: Secondary | ICD-10-CM

## 2018-02-25 DIAGNOSIS — F10121 Alcohol abuse with intoxication delirium: Secondary | ICD-10-CM

## 2018-02-25 DIAGNOSIS — E872 Acidosis: Secondary | ICD-10-CM

## 2018-02-25 DIAGNOSIS — I5032 Chronic diastolic (congestive) heart failure: Secondary | ICD-10-CM

## 2018-02-25 DIAGNOSIS — F320 Major depressive disorder, single episode, mild: Secondary | ICD-10-CM

## 2018-02-25 DIAGNOSIS — J80 Acute respiratory distress syndrome: Secondary | ICD-10-CM

## 2018-02-25 LAB — HYPERSENSITIVITY PNEUMONITIS
A. Pullulans Abs: NEGATIVE
A.Fumigatus #1 Abs: NEGATIVE
Micropolyspora faeni, IgG: NEGATIVE
Pigeon Serum Abs: NEGATIVE
Thermoact. Saccharii: NEGATIVE
Thermoactinomyces vulgaris, IgG: NEGATIVE

## 2018-02-25 LAB — LIPID PANEL
Cholesterol: 169 mg/dL (ref 0–200)
HDL: 25 mg/dL — ABNORMAL LOW (ref >40)
LDL Cholesterol: 104 mg/dL — ABNORMAL HIGH (ref 0–99)
Total CHOL/HDL Ratio: 6.8 RATIO
Triglycerides: 202 mg/dL — ABNORMAL HIGH (ref <150)
VLDL: 40 mg/dL (ref 0–40)

## 2018-02-25 LAB — BASIC METABOLIC PANEL
Anion gap: 12 (ref 5–15)
BUN: 16 mg/dL (ref 6–20)
CO2: 26 mmol/L (ref 22–32)
Calcium: 8.8 mg/dL — ABNORMAL LOW (ref 8.9–10.3)
Chloride: 97 mmol/L — ABNORMAL LOW (ref 101–111)
Creatinine, Ser: 0.64 mg/dL (ref 0.44–1.00)
GFR calc Af Amer: >60 mL/min (ref >60)
GFR calc non Af Amer: >60 mL/min (ref >60)
Glucose, Bld: 296 mg/dL — ABNORMAL HIGH (ref 65–99)
Potassium: 4.3 mmol/L (ref 3.5–5.1)
Sodium: 135 mmol/L (ref 135–145)

## 2018-02-25 LAB — IRON AND TIBC
Iron: 52 ug/dL (ref 28–170)
Saturation Ratios: 22 % (ref 10.4–31.8)
TIBC: 232 ug/dL — ABNORMAL LOW (ref 250–450)
UIBC: 180 ug/dL

## 2018-02-25 LAB — CBC
HCT: 28.6 % — ABNORMAL LOW (ref 36.0–46.0)
Hemoglobin: 9.4 g/dL — ABNORMAL LOW (ref 12.0–15.0)
MCH: 33.5 pg (ref 26.0–34.0)
MCHC: 32.9 g/dL (ref 30.0–36.0)
MCV: 101.8 fL — ABNORMAL HIGH (ref 78.0–100.0)
Platelets: 353 10*3/uL (ref 150–400)
RBC: 2.81 MIL/uL — ABNORMAL LOW (ref 3.87–5.11)
RDW: 13.4 % (ref 11.5–15.5)
WBC: 20.3 10*3/uL — ABNORMAL HIGH (ref 4.0–10.5)

## 2018-02-25 LAB — HEMOGLOBIN A1C
Hgb A1c MFr Bld: 5.7 % — ABNORMAL HIGH (ref 4.8–5.6)
Mean Plasma Glucose: 116.89 mg/dL

## 2018-02-25 LAB — RETICULOCYTES
RBC.: 2.86 MIL/uL — ABNORMAL LOW (ref 3.87–5.11)
Retic Count, Absolute: 108.7 10*3/uL (ref 19.0–186.0)
Retic Ct Pct: 3.8 % — ABNORMAL HIGH (ref 0.4–3.1)

## 2018-02-25 LAB — PHOSPHORUS: Phosphorus: 4.2 mg/dL (ref 2.5–4.6)

## 2018-02-25 LAB — FERRITIN: Ferritin: 429 ng/mL — ABNORMAL HIGH (ref 11–307)

## 2018-02-25 LAB — VITAMIN B12: Vitamin B-12: 998 pg/mL — ABNORMAL HIGH (ref 180–914)

## 2018-02-25 LAB — GLUCOSE, CAPILLARY
Glucose-Capillary: 174 mg/dL — ABNORMAL HIGH (ref 65–99)
Glucose-Capillary: 186 mg/dL — ABNORMAL HIGH (ref 65–99)

## 2018-02-25 LAB — MAGNESIUM: Magnesium: 1.9 mg/dL (ref 1.7–2.4)

## 2018-02-25 LAB — FOLATE: Folate: 14.1 ng/mL (ref >5.9)

## 2018-02-25 MED ORDER — INSULIN ASPART 100 UNIT/ML ~~LOC~~ SOLN
0.0000 [IU] | Freq: Three times a day (TID) | SUBCUTANEOUS | Status: DC
  Administered 2018-02-26: 2 [IU] via SUBCUTANEOUS
  Administered 2018-02-26 – 2018-02-27 (×2): 3 [IU] via SUBCUTANEOUS
  Administered 2018-02-27 (×2): 1 [IU] via SUBCUTANEOUS
  Administered 2018-02-28: 2 [IU] via SUBCUTANEOUS

## 2018-02-25 MED ORDER — FLUOXETINE HCL 20 MG PO CAPS
20.0000 mg | ORAL_CAPSULE | Freq: Every day | ORAL | Status: DC
  Filled 2018-02-25 (×4): qty 1

## 2018-02-25 MED ORDER — INSULIN ASPART 100 UNIT/ML ~~LOC~~ SOLN
0.0000 [IU] | SUBCUTANEOUS | Status: DC
  Administered 2018-02-25: 2 [IU] via SUBCUTANEOUS

## 2018-02-25 NOTE — Progress Notes (Signed)
PROGRESS NOTE    Natalie Morrison  ZDG:[id number] DOB: 09-11-1971 DOA: 02/22/2018 PCP: Eulas Post, MD   Brief Narrative:  47 year old WF PMHx EtOH abuse, Anxiety, Depression, Asthma, Hepatitis A, HTN  Presenting with shortness of breath and fever.   Recently hospitalized from 4/25 to 9/51, course complicated by pneumonia, ARDS, UTI, and EtOH withdrawal.  She was reintubated several times for self extubation and stridor.  Her blood cultures and cultures from BAL on 4/29 were negative.  She was discharged home on room air with cipro and diflucan.     She reports she was feeling well except using her inhaler more since 5/18.  She reported fever, chills, nonproductive cough, congestion, ear pain, and increasing shortness of breath since yesterday.  Additionally, she denies smoking, but states she has had a few alcoholic drinks at home, including yesterday, because she thought she could possibly be withdrawing.    In the ER, she was febrile, 103.8, borderline hypotensive, tachycardic, tachypneic ranging from 30-40's, and initially hypoxic on room air at 87% requiring ventri mask.  Labs noted for WBC 42.0 with left shift, Hgb 10.9, K 2.9, CO2 14, glucose 159, AG 17, albumin 2.9, protein 6.4, neg troponin x 2, lactic 4.01, which has improved to 1.08 after IVF. UA neg, and CXR appears slightly improved from prior, but still noted for persistent left mid and lower opacities.  Currently patient remains borderline hypotensive despite almost 4L NS and remains significantly tachypneic.   Blood and urine cultures sent and started on vancomycin and zosyn.   PCCM called by [facility] for possible admission versus consult; will admit to ICU.     Subjective: 5/24 4, sitting in chair comfortably.  Negative CP, negative abdominal pain.  Positive S OB.  States does not use home O2.    Assessment & Plan:   Active Problems:   Acute respiratory failure with hypoxia (HCC)   Sepsis (HCC)   Pressure injury of  skin   Acute respiratory failure with hypoxia/ARDS/HCAP - Multifactorial aspiration pneumonia secondary to EtOH intoxication, pneumonitis secondary to aspiration, HCAP?, ILD? - Favor pneumonitis.  However will complete course of empiric antibiotics. -Titrate O2 to maintain SPO2> 93% -PCXR pending 5/27.  Evaluate and will change   Chronic diastolic CHF -Strict in and out -Daily weight - Transfuse for hemoglobin<8 - Lasix IV 40 mg  BID -Patient's BP borderline would not add additional agents at this time.   Lactic Acidosis -Resolved   Hypokalemia -Potassium goal> 4  Dysphasia -5/23 Modified Barium Swallow; cleared for regular diet.   Anemia -Anemia panel pending  Hyperglycemia -Hemoglobin A1c pending -Lipid panel pending -May be iatrogenic secondary to steroids - Sensitive SSI  EtOH abuse  -Last drink 5/20  -Folate 1 mg daily -Thiamine 100 mg daily  Anxiety/Depression -Start Prozac 20 mg daily  -Currently on Ativan PRN     Goals of care - 5/24 PT/OT; EtOH abuse, aspiration pneumonia, deconditioning consult evaluate for CIR vs SNF    DVT prophylaxis: Sq Heparin Code Status: Full Family Communication: None Disposition Plan: TBD   Consultants:    Procedures/Significant Events:  5/21 Echocardiogram;Left ventricle: LVEF= 65% to 70%.--(grade 1 diastolic dysfunction). 5/21 CTA chest PE protocol: PE-Moderate symmetric patchy ground-glass opacities in a slight upper lobe and anterior prominent distribution with mild septal: sequela of recent ARDS is favored.-  Mild bilateral hilar adenopathy is likely reactive. 5/21 CXR  > b/l opacities. 5/21 Abd US> no acute process.    Autoimmune labs:  Ds-DNA neg, anti jo neg, anti Scl neg, RF neg, ANCA neg, ANA neg, HSP panel pending    I have personally reviewed and interpreted all radiology studies and my findings are as above.  VENTILATOR SETTINGS:    Cultures 5/21 BC x2 >> 5/21 UC >> 5/21 RVP >>  neg   Antimicrobials: Anti-infectives (From admission, onward)   Start     Stop   02/24/18 1000  amoxicillin-clavulanate (AUGMENTIN) 600-42.9 MG/5ML suspension 876 mg         02/23/18 0400  vancomycin (VANCOCIN) IVPB 750 mg/150 ml premix  Status:  Discontinued     02/22/18 1558   02/23/18 0330  vancomycin (VANCOCIN) IVPB 750 mg/150 ml premix  Status:  Discontinued     02/24/18 0905   02/22/18 1400  vancomycin (VANCOCIN) IVPB 750 mg/150 ml premix  Status:  Discontinued     02/22/18 1557   02/22/18 0600  meropenem (MERREM) 1 g in sodium chloride 0.9 % 100 mL IVPB  Status:  Discontinued     02/24/18 0905   02/22/18 0145  vancomycin (VANCOCIN) IVPB 1000 mg/200 mL premix     02/22/18 0305   02/22/18 0145  piperacillin-tazobactam (ZOSYN) IVPB 3.375 g     02/22/18 0235       Devices    LINES / TUBES:      Continuous Infusions: . sodium chloride       Objective: Vitals:   02/25/18 0404 02/25/18 0500 02/25/18 0600 02/25/18 0716  BP:   106/75   Pulse:   82   Resp:   (!) 25   Temp: 98.1 F (36.7 C)     TempSrc: Oral     SpO2:   96% 99%  Weight:  128 lb 1.4 oz (58.1 kg)    Height:        Intake/Output Summary (Last 24 hours) at 02/25/2018 0732 Last data filed at 02/24/2018 2000 Gross per 24 hour  Intake 25 ml  Output 2400 ml  Net -2375 ml   Filed Weights   02/22/18 0637 02/23/18 0500 02/25/18 0500  Weight: 130 lb 1.1 oz (59 kg) 131 lb 2.8 oz (59.5 kg) 128 lb 1.4 oz (58.1 kg)    Examination:  General: A/O x4, positive acute respiratory distress, cachectic Eyes: negative scleral hemorrhage, negative anisocoria, negative icterus Neck:  Negative scars, masses, torticollis, lymphadenopathy, JVD Lungs: diffuse poor air movement, mild expiratory wheeze, negative crackles.   Cardiovascular: Regular rate and rhythm without murmur gallop or rub normal S1 and S2 Abdomen: negative abdominal pain, nondistended, positive soft, bowel sounds, no rebound, no ascites, no  appreciable mass Extremities: No significant cyanosis, clubbing, or edema bilateral lower extremities Skin: Negative rashes, lesions, ulcers Psychiatric:  Negative depression, negative anxiety, negative fatigue, negative mania  Central nervous system:  Cranial nerves II through XII intact, tongue/uvula midline, all extremities muscle strength 5/5, sensation intact throughout,  negative dysarthria, negative expressive aphasia, negative receptive aphasia.  .     Data Reviewed: Care during the described time interval was provided by me .  I have reviewed this patient's available data, including medical history, events of note, physical examination, and all test results as part of my evaluation.   CBC: Recent Labs  Lab 02/22/18 0138 02/22/18 0452 02/25/18 0327  WBC 42.1* 37.2* 20.3*  NEUTROABS 37.1*  --   --   HGB 10.9* 9.3* 9.4*  HCT 32.4* 28.6* 28.6*  MCV 99.7 103.2* 101.8*  PLT 444* 272 469   Basic Metabolic Panel:  Recent Labs  Lab 02/22/18 0452 02/22/18 1352 02/23/18 0812 02/24/18 0306 02/24/18 1541 02/25/18 0327  NA 138 139 139 138 135 135  K 2.8* 3.7 3.0* 3.3* 4.1 4.3  CL 110 112* 108 104 97* 97*  CO2 18* 20* _0 GLUCOSE 107* 138* 99 113* 273* 296*  BUN 5* <5* <5* _1 CREATININE 0.62 0.52 0.56 0.52 0.54 0.64  CALCIUM 7.5* 7.7* 8.3* 8.5* 8.6* 8.8*  MG 1.1* 1.9 1.6* 1.4*  --  1.9  PHOS 3.5  --   --   --   --  4.2   GFR: Estimated Creatinine Clearance: 77.4 mL/min (by C-G formula based on SCr of 0.64 mg/dL). Liver Function Tests: Recent Labs  Lab 02/22/18 0138  AST 16  ALT 14  ALKPHOS 100  BILITOT 0.7  PROT 6.4*  ALBUMIN 2.9*   Recent Labs  Lab 02/22/18 0452  LIPASE 23   No results for input(s): AMMONIA in the last 168 hours. Coagulation Profile: Recent Labs  Lab 02/22/18 0138  INR 1.16   Cardiac Enzymes: Recent Labs  Lab 02/22/18 0139 02/22/18 1352  CKTOTAL  --  10*  TROPONINI <0.03  --    BNP (last 3 results) No results for  input(s): PROBNP in the last 8760 hours. HbA1C: No results for input(s): HGBA1C in the last 72 hours. CBG: Recent Labs  Lab 02/23/18 1939 02/23/18 2326 02/24/18 0337 02/24/18 1547 02/24/18 2009  GLUCAP 137* 114* 127* 264* 236*   Lipid Profile: No results for input(s): CHOL, HDL, LDLCALC, TRIG, CHOLHDL, LDLDIRECT in the last 72 hours. Thyroid Function Tests: No results for input(s): TSH, T4TOTAL, FREET4, T3FREE, THYROIDAB in the last 72 hours. Anemia Panel: No results for input(s): VITAMINB12, FOLATE, FERRITIN, TIBC, IRON, RETICCTPCT in the last 72 hours. Urine analysis:    Component Value Date/Time   COLORURINE STRAW (A) 02/22/2018 0300   APPEARANCEUR CLEAR 02/22/2018 0300   LABSPEC 1.003 (L) 02/22/2018 0300   PHURINE 5.0 02/22/2018 0300   GLUCOSEU NEGATIVE 02/22/2018 0300   HGBUR NEGATIVE 02/22/2018 0300   HGBUR negative 01/01/2010 1340   BILIRUBINUR NEGATIVE 02/22/2018 0300   BILIRUBINUR 3+ 03/18/2016 1614   KETONESUR NEGATIVE 02/22/2018 0300   PROTEINUR NEGATIVE 02/22/2018 0300   UROBILINOGEN 2.0 03/18/2016 1614   UROBILINOGEN 0.2 05/06/2013 1440   NITRITE NEGATIVE 02/22/2018 0300   LEUKOCYTESUR NEGATIVE 02/22/2018 0300   Sepsis Labs: _2 (procalcitonin:4,lacticidven:4)  ) Recent Results (from the past 240 hour(s))  Culture, blood (Routine x 2)     Status: None (Preliminary result)   Collection Time: 02/22/18  1:56 AM  Result Value Ref Range Status   Specimen Description BLOOD LEFT HAND  Final   Special Requests   Final    BOTTLES DRAWN AEROBIC AND ANAEROBIC Blood Culture results may not be optimal due to an inadequate volume of blood received in culture bottles   Culture   Final    NO GROWTH 2 DAYS Performed at [facility], [place] [address], [place],  [address]    Report Status PENDING  Incomplete  Culture, blood (Routine x 2)     Status: None (Preliminary result)   Collection Time: 02/22/18  1:56 AM  Result Value Ref Range Status    Specimen Description BLOOD LEFT FOREARM  Final   Special Requests   Final    BOTTLES DRAWN AEROBIC AND ANAEROBIC Blood Culture adequate volume   Culture   Final    NO GROWTH 2 DAYS Performed  at [facility], [place] [address], [place], [place] [address]    Report Status PENDING  Incomplete  Urine culture     Status: None   Collection Time: 02/22/18  3:00 AM  Result Value Ref Range Status   Specimen Description URINE, RANDOM  Final   Special Requests NONE  Final   Culture   Final    NO GROWTH Performed at [facility], [address], [place], [place] [address]    Report Status 02/23/2018 FINAL  Final  Respiratory Panel by PCR     Status: None   Collection Time: 02/22/18  6:32 AM  Result Value Ref Range Status   Adenovirus NOT DETECTED NOT DETECTED Final   Coronavirus 229E NOT DETECTED NOT DETECTED Final   Coronavirus HKU1 NOT DETECTED NOT DETECTED Final   Coronavirus NL63 NOT DETECTED NOT DETECTED Final   Coronavirus OC43 NOT DETECTED NOT DETECTED Final   Metapneumovirus NOT DETECTED NOT DETECTED Final   Rhinovirus / Enterovirus NOT DETECTED NOT DETECTED Final   Influenza A NOT DETECTED NOT DETECTED Final   Influenza B NOT DETECTED NOT DETECTED Final   Parainfluenza Virus 1 NOT DETECTED NOT DETECTED Final   Parainfluenza Virus 2 NOT DETECTED NOT DETECTED Final   Parainfluenza Virus 3 NOT DETECTED NOT DETECTED Final   Parainfluenza Virus 4 NOT DETECTED NOT DETECTED Final   Respiratory Syncytial Virus NOT DETECTED NOT DETECTED Final   Bordetella pertussis NOT DETECTED NOT DETECTED Final   Chlamydophila pneumoniae NOT DETECTED NOT DETECTED Final   Mycoplasma pneumoniae NOT DETECTED NOT DETECTED Final    Comment: Performed at [facility], [place] [address], [place], [place] [address]  MRSA PCR Screening     Status: Abnormal   Collection Time: 02/22/18  6:32 AM  Result Value Ref Range Status   MRSA by PCR INVALID RESULTS, SPECIMEN SENT FOR CULTURE (A) NEGATIVE Final     Comment:        The GeneXpert MRSA Assay (FDA approved for NASAL specimens only), is one component of a comprehensive MRSA colonization surveillance program. It is not intended to diagnose MRSA infection nor to guide or monitor treatment for MRSA infections. Performed at [facility], [place] [address] [place], [place] [address]   MRSA culture     Status: None   Collection Time: 02/22/18  7:32 AM  Result Value Ref Range Status   Specimen Description NASAL SWAB  Final   Special Requests NONE  Final   Culture   Final    NO MRSA DETECTED Performed at [facility], [address], [place], [place] [address]    Report Status 02/23/2018 FINAL  Final         Radiology Studies: Dg Chest Port 1 View  Result Date: 02/24/2018 CLINICAL DATA:  Respiratory distress. EXAM: PORTABLE CHEST 1 VIEW COMPARISON:  CT chest Feb 22, 2018 and chest radiograph Feb 22, 2018 FINDINGS: Similar to worsening interstitial prominence without pleural effusion or focal consolidation. Cardiac silhouette is upper limits of normal, mediastinal silhouette is nonsuspicious. No pneumothorax. Soft tissue planes and included osseous structures are normal. IMPRESSION: Similar to worsening interstitial changes seen with ARDS or infectious etiology. No focal consolidation. Electronically Signed   By: Elon Alas M.D.   On: 02/24/2018 01:16   Dg Swallowing Func-speech Pathology  Result Date: 02/24/2018 Objective Swallowing Evaluation: Type of Study: MBS-Modified Barium Swallow Study  Patient Details Name: Natalie Morrison MRN: [id number] Date of Birth:  10/01/1971 Today's Date: 02/24/2018 Time: SLP Start Time (ACUTE ONLY): 1007 -SLP Stop Time (ACUTE ONLY): 1043 SLP Time Calculation (min) (ACUTE ONLY): 36 min Past Medical History: Past Medical History: Diagnosis Date . Alcohol abuse  . Anemia  . Anxiety  . Anxiety and depression  . Asthma  . Depression  . Hepatitis A   "when I was a kid" . Hypertension  Past  Surgical History: Past Surgical History: Procedure Laterality Date . BREAST LUMPECTOMY Right  . TONSILLECTOMY AND ADENOIDECTOMY Bilateral over 30 years ago HPI: Pt is a 47 year old female with PMH of alcohol abuse, asthma, anxiety and depression presenting with shortness of breath and fever. She was recently hospitalized from 4/25 to 0/98, course complicated by pneumonia, ARDS, UTI, and alcohol withdrawal.  She was reintubated several times for self extubation and stridor. SLP saw at that time with evidence of post-extubation/cognitive dysphagia with MBS recommending Dys 2 diet, honey thick liquids by spoon.  Subjective: pt remembers SLP from previous admission and remembers the need for thickened liquids but says she was not consistently thickening liquids at home Assessment / Plan / Recommendation CHL IP CLINICAL IMPRESSIONS 02/24/2018 Clinical Impression Pt shows improvements since MBS last admission, likely secondary to improved mentation and increased time post-extubation. Her oral phase is now [facility]. She continues to have small amounts of thin liquids that enter her laryngeal vestibule during the swallow, suspect from decreased glottal closure given that her vocal quality remains hoarse. Liquids contact the vocal folds but are ejected with a delayed cough. It is possible that some liquid could spill further into the trachea before she elicits a cough, but this was not observed today. She achieved better airway protection with no penetration or aspiration when using a chin tuck with thin liquids. Solids also were cleared from her pharynx with good efficiency and airway protection. Recommend that pt eat unrestricted textures and liquids, but would focus more on aspiration precautions and swallowing strategies as outlined below. Would administer pills whole in puree to facilitate esophageal transit, as was needed on testing today. SLP will f/u for tolerance and to increase utilization of strategies. SLP Visit  Diagnosis Dysphagia, pharyngeal phase (R13.13) Attention and concentration deficit following -- Frontal lobe and executive function deficit following -- Impact on safety and function Mild aspiration risk   CHL IP TREATMENT RECOMMENDATION 02/24/2018 Treatment Recommendations Therapy as outlined in treatment plan below   Prognosis 02/24/2018 Prognosis for Safe Diet Advancement Good Barriers to Reach Goals -- Barriers/Prognosis Comment -- CHL IP DIET RECOMMENDATION 02/24/2018 SLP Diet Recommendations Regular solids;Thin liquid Liquid Administration via Cup;Straw Medication Administration Whole meds with puree Compensations Slow rate;Small sips/bites;Chin tuck;Other (Comment) Postural Changes Seated upright at 90 degrees;Remain semi-upright after after feeds/meals (Comment)   CHL IP OTHER RECOMMENDATIONS 02/24/2018 Recommended Consults -- Oral Care Recommendations Oral care BID Other Recommendations --   CHL IP FOLLOW UP RECOMMENDATIONS 02/24/2018 Follow up Recommendations (No Data)   CHL IP FREQUENCY AND DURATION 02/24/2018 Speech Therapy Frequency (ACUTE ONLY) min 2x/week Treatment Duration 2 weeks      CHL IP ORAL PHASE 02/24/2018 Oral Phase WFL Oral - Pudding Teaspoon -- Oral - Pudding Cup -- Oral - Honey Teaspoon -- Oral - Honey Cup -- Oral - Nectar Teaspoon -- Oral - Nectar Cup -- Oral - Nectar Straw -- Oral - Thin Teaspoon -- Oral - Thin Cup -- Oral - Thin Straw -- Oral - Puree -- Oral - Mech Soft -- Oral - Regular -- Oral - Multi-Consistency --  Oral - Pill -- Oral Phase - Comment --  CHL IP PHARYNGEAL PHASE 02/24/2018 Pharyngeal Phase Impaired Pharyngeal- Pudding Teaspoon -- Pharyngeal -- Pharyngeal- Pudding Cup -- Pharyngeal -- Pharyngeal- Honey Teaspoon -- Pharyngeal -- Pharyngeal- Honey Cup NT Pharyngeal -- Pharyngeal- Nectar Teaspoon -- Pharyngeal -- Pharyngeal- Nectar Cup NT Pharyngeal -- Pharyngeal- Nectar Straw NT Pharyngeal -- Pharyngeal- Thin Teaspoon NT Pharyngeal -- Pharyngeal- Thin Cup Reduced  airway/laryngeal closure;Penetration/Aspiration during swallow Pharyngeal Material enters airway, CONTACTS cords and then ejected out Pharyngeal- Thin Straw Reduced airway/laryngeal closure;Penetration/Aspiration during swallow Pharyngeal Material enters airway, CONTACTS cords and then ejected out Pharyngeal- Puree Reduced airway/laryngeal closure Pharyngeal -- Pharyngeal- Mechanical Soft Reduced airway/laryngeal closure Pharyngeal -- Pharyngeal- Regular -- Pharyngeal -- Pharyngeal- Multi-consistency -- Pharyngeal -- Pharyngeal- Pill Reduced airway/laryngeal closure Pharyngeal -- Pharyngeal Comment --  CHL IP CERVICAL ESOPHAGEAL PHASE 02/24/2018 Cervical Esophageal Phase WFL Pudding Teaspoon -- Pudding Cup -- Honey Teaspoon -- Honey Cup -- Nectar Teaspoon -- Nectar Cup -- Nectar Straw -- Thin Teaspoon -- Thin Cup -- Thin Straw -- Puree -- Mechanical Soft -- Regular -- Multi-consistency -- Pill -- Cervical Esophageal Comment -- No flowsheet data found. Germain Osgood 02/24/2018, 11:20 AM  Germain Osgood, M.A. CCC-SLP [phone]                  Scheduled Meds: . amoxicillin-clavulanate  875 mg of amoxicillin Oral BID  . collagenase   Topical Daily  . folic acid  1 mg Oral Daily  . furosemide  40 mg Intravenous Q12H  . heparin  5,000 Units Subcutaneous Q8H  . ipratropium-albuterol  3 mL Nebulization Q6H  . LORazepam  1 mg Intravenous Q4H  . mouth rinse  15 mL Mouth Rinse BID  . methylPREDNISolone (SOLU-MEDROL) injection  60 mg Intravenous Q6H  . nicotine  21 mg Transdermal Daily  . potassium chloride  40 mEq Oral BID  . thiamine  100 mg Oral Daily   Continuous Infusions: . sodium chloride       LOS: 3 days    Time spent: 40 minutes    WOODS, Geraldo Docker, MD [facility] Pager [phone]   If 7PM-7AM, please contact night-coverage [email or web address] Password [facility] 02/25/2018, 7:32 AM

## 2018-02-25 NOTE — Progress Notes (Signed)
Pt HR brady in 40's for brief period. Notified Georgann Housekeeper, NP at bedside, no new orders given. Pt now NSR 80's. Will continue to monitor.

## 2018-02-25 NOTE — Progress Notes (Signed)
Inpatient Diabetes Program Recommendations  AACE/ADA: New Consensus Statement on Inpatient Glycemic Control (2015)  Target Ranges:  Prepandial:   less than 140 mg/dL      Peak postprandial:   less than 180 mg/dL (1-2 hours)      Critically ill patients:  140 - 180 mg/dL   Lab Results  Component Value Date   GLUCAP 174 (H) 02/25/2018    Review of Glycemic Control Results for Natalie Morrison, Natalie Morrison (MRN [id number]) as of 02/25/2018 11:24  Ref. Range 02/24/2018 15:47 02/24/2018 20:09 02/25/2018 08:21  Glucose-Capillary Latest Ref Range: 65 - 99 mg/dL 264 (H) 236 (H) 174 (H)   Diabetes history: no DM hx noted Outpatient Diabetes medications: none Current orders for Inpatient glycemic control: none  Inpatient Diabetes Program Recommendations:    In the setting of steroids, may want to consider adding Novolog 0-9 units TID + HS using the glycemic control order set. Will need to be adjusted once steroids are tapered.  Thanks, Bronson Curb, MSN, RNC-OB Diabetes Coordinator [phone] (8a-5p)

## 2018-02-25 NOTE — Care Management Note (Signed)
Case Management Note  Patient Details  Name: Natalie Morrison MRN: [id number] Date of Birth: 03-Jun-1971  Subjective/Objective:   From home with spouse, who works, presents with acute resp failure, asp pna, she is active with [facility] for [facility], [place], HHOT, But[facility] Penny aware of admit.  She is ambulatory.                                   Action/Plan: DC home with resumption of [place] services when ready for dc.  Expected Discharge Date:                  Expected Discharge Plan:  [place]  In-House Referral:     Discharge planning Services  CM Consult  Post Acute Care Choice:  Resumption of Svcs/PTA Provider Choice offered to:     DME Arranged:    DME Agency:     HH Arranged:  RN, Disease Management, PT, OT HH Agency:  [facility]  Status of Service:  Completed, signed off  If discussed at [place] of Stay Meetings, dates discussed:    Additional Comments:  Zenon Mayo, RN 02/25/2018, 2:31 PM

## 2018-02-25 NOTE — Progress Notes (Signed)
  Speech Language Pathology Treatment: Dysphagia  Patient Details Name: Natalie Morrison MRN: [id number] DOB: 11/21/1970 Today's Date: 02/25/2018 Time: 1550-1600 SLP Time Calculation (min) (ACUTE ONLY): 10 min  Assessment / Plan / Recommendation Clinical Impression  Pt demonstrates adequate tolerance of thin liquids without signs of aspiration. Vocal quality subjectively improving per pt. She is able to state chin tuck strategy and complete. SLP reinforced rationale. Education complete. Will sign off.   HPI HPI: Pt is a 47 year old female with PMH of alcohol abuse, asthma, anxiety and depression presenting with shortness of breath and fever. She was recently hospitalized from 4/25 to 5/00, course complicated by pneumonia, ARDS, UTI, and alcohol withdrawal.  She was reintubated several times for self extubation and stridor. SLP saw at that time with evidence of post-extubation/cognitive dysphagia with MBS recommending Dys 2 diet, honey thick liquids by spoon.      SLP Plan  All goals met       Recommendations  Diet recommendations: Regular;Thin liquid Liquids provided via: Cup;Straw Medication Administration: Whole meds with puree Supervision: Patient able to self feed;Full supervision/cueing for compensatory strategies Compensations: Slow rate;Small sips/bites;Chin tuck;Other (Comment) Postural Changes and/or Swallow Maneuvers: Seated upright 90 degrees                Oral Care Recommendations: Oral care BID Follow up Recommendations: None Plan: All goals met       GO               Herbie Baltimore, MA CCC-SLP [phone]  Lynann Beaver 02/25/2018, 4:02 PM

## 2018-02-26 LAB — GLUCOSE, CAPILLARY
Glucose-Capillary: 226 mg/dL — ABNORMAL HIGH (ref 65–99)
Glucose-Capillary: 154 mg/dL — ABNORMAL HIGH (ref 65–99)
Glucose-Capillary: 141 mg/dL — ABNORMAL HIGH (ref 65–99)
Glucose-Capillary: 245 mg/dL — ABNORMAL HIGH (ref 65–99)
Glucose-Capillary: 137 mg/dL — ABNORMAL HIGH (ref 65–99)

## 2018-02-26 LAB — BASIC METABOLIC PANEL
Anion gap: 11 (ref 5–15)
BUN: 20 mg/dL (ref 6–20)
CO2: 28 mmol/L (ref 22–32)
Calcium: 9.1 mg/dL (ref 8.9–10.3)
Chloride: 98 mmol/L — ABNORMAL LOW (ref 101–111)
Creatinine, Ser: 0.69 mg/dL (ref 0.44–1.00)
GFR calc Af Amer: >60 mL/min (ref >60)
GFR calc non Af Amer: >60 mL/min (ref >60)
Glucose, Bld: 271 mg/dL — ABNORMAL HIGH (ref 65–99)
Potassium: 4.3 mmol/L (ref 3.5–5.1)
Sodium: 137 mmol/L (ref 135–145)

## 2018-02-26 LAB — CBC
HCT: 28.7 % — ABNORMAL LOW (ref 36.0–46.0)
Hemoglobin: 9.4 g/dL — ABNORMAL LOW (ref 12.0–15.0)
MCH: 33.2 pg (ref 26.0–34.0)
MCHC: 32.8 g/dL (ref 30.0–36.0)
MCV: 101.4 fL — ABNORMAL HIGH (ref 78.0–100.0)
Platelets: 401 10*3/uL — ABNORMAL HIGH (ref 150–400)
RBC: 2.83 MIL/uL — ABNORMAL LOW (ref 3.87–5.11)
RDW: 13.4 % (ref 11.5–15.5)
WBC: 36.6 10*3/uL — ABNORMAL HIGH (ref 4.0–10.5)

## 2018-02-26 LAB — MAGNESIUM: Magnesium: 1.7 mg/dL (ref 1.7–2.4)

## 2018-02-26 MED ORDER — IPRATROPIUM-ALBUTEROL 0.5-2.5 (3) MG/3ML IN SOLN
3.0000 mL | Freq: Three times a day (TID) | RESPIRATORY_TRACT | Status: DC
  Administered 2018-02-26 – 2018-02-27 (×2): 3 mL via RESPIRATORY_TRACT
  Filled 2018-02-26 (×3): qty 3

## 2018-02-26 MED ORDER — METHYLPREDNISOLONE SODIUM SUCC 125 MG IJ SOLR
60.0000 mg | Freq: Two times a day (BID) | INTRAMUSCULAR | Status: DC
  Administered 2018-02-26 – 2018-02-27 (×2): 60 mg via INTRAVENOUS
  Filled 2018-02-26 (×2): qty 2

## 2018-02-26 NOTE — Evaluation (Signed)
Physical Therapy Evaluation Patient Details Name: Natalie Morrison MRN: [id number] DOB: Feb 15, 1971 Today's Date: 02/26/2018   History of Present Illness  Pt is a 47 y.o. female with PMH of alcohol abuse, asthma, anxiety and depression, admitted 02/22/18 with SOB and fever. Worked up for multifactorial aspiration pneumonia secondary to EtOH intoxication, pneumonitis secondary to aspiration, HCAP?, ILD?    Clinical Impression  Patient evaluated by Physical Therapy with no further acute PT needs identified. All education has been completed and the patient has no further questions. See below for any follow-up Physical Therapy needs. PT is signing off. Thank you for this referral.    Follow Up Recommendations Home health PT;Supervision - Intermittent    Equipment Recommendations  None recommended by PT    Recommendations for Other Services       Precautions / Restrictions Precautions Precautions: Fall Restrictions Weight Bearing Restrictions: No      Mobility  Bed Mobility Overal bed mobility: Independent             General bed mobility comments: Sitting EOB upon arrival  Transfers Overall transfer level: Independent Equipment used: None Transfers: Sit to/from Stand Sit to Stand: Min guard            Ambulation/Gait Ambulation/Gait assistance: Independent Ambulation Distance (Feet): 350 Feet Assistive device: None Gait Pattern/deviations: Step-through pattern;Decreased stride length;Wide base of support Gait velocity: Decreased   General Gait Details: Pt reporting "I have to work on my girth," meaning she knows she typically walks with wide BOS and is trying to take more narrow steps; good ability to compensate for this  Stairs Stairs: Yes Stairs assistance: Modified independent (Device/Increase time) Stair Management: One rail Left   General stair comments: Simulated ascending steps by high marching with single UE support on rail  Wheelchair Mobility     Modified Rankin (Stroke Patients Only)       Balance Overall balance assessment: Needs assistance   Sitting balance-Leahy Scale: Good       Standing balance-Leahy Scale: Good               High level balance activites: Side stepping;Backward walking;Direction changes;Turns;Sudden stops;Head turns High Level Balance Comments: No instability or overt LOB noted with higher level balance activities. Decreased ability for SLS             Pertinent Vitals/Pain Pain Assessment: No/denies pain    Home Living Family/patient expects to be discharged to:: Private residence Living Arrangements: Spouse/significant other Available Help at Discharge: Family(unsure reliability of 24/7 support) Type of Home: House Home Access: Stairs to enter Entrance Stairs-Rails: Right;Left;Can reach both Entrance Stairs-Number of Steps: 4 Home Layout: One level Home Equipment: Cane - single point;Grab bars - tub/shower;Hand held shower head;Shower seat      Prior Function Level of Independence: Independent   Gait / Transfers Assistance Needed: Pt reports indep and no recent falls     Comments: unsure of PLOF     Hand Dominance        Extremity/Trunk Assessment   Upper Extremity Assessment Upper Extremity Assessment: Defer to OT evaluation    Lower Extremity Assessment Lower Extremity Assessment: Overall WFL for tasks assessed       Communication   Communication: No difficulties  Cognition Arousal/Alertness: Awake/alert Behavior During Therapy: Flat affect Overall Cognitive Status: No family/caregiver present to determine baseline cognitive functioning  General Comments: Pt focused on "I'll do whatever I have to do to get out of here"      General Comments      Exercises     Assessment/Plan    PT Assessment All further PT needs can be met in the next venue of care  PT Problem List Decreased strength;Decreased  activity tolerance;Decreased balance;Decreased mobility;Decreased knowledge of use of DME;Decreased safety awareness       PT Treatment Interventions      PT Goals (Current goals can be found in the Care Plan section)  Acute Rehab PT Goals Patient Stated Goal: go home PT Goal Formulation: All assessment and education complete, DC therapy    Frequency     Barriers to discharge        Co-evaluation               AM-PAC PT "6 Clicks" Daily Activity  Outcome Measure Difficulty turning over in bed (including adjusting bedclothes, sheets and blankets)?: None Difficulty moving from lying on back to sitting on the side of the bed? : None Difficulty sitting down on and standing up from a chair with arms (e.g., wheelchair, bedside commode, etc,.)?: None Help needed moving to and from a bed to chair (including a wheelchair)?: None Help needed walking in hospital room?: None Help needed climbing 3-5 steps with a railing? : A Little 6 Click Score: 23    End of Session Equipment Utilized During Treatment: Gait belt Activity Tolerance: Patient tolerated treatment well Patient left: in bed;with call bell/phone within reach Nurse Communication: Mobility status PT Visit Diagnosis: Unsteadiness on feet (R26.81);Other abnormalities of gait and mobility (R26.89);Muscle weakness (generalized) (M62.81);Difficulty in walking, not elsewhere classified (R26.2)    Time: 1130-1140 PT Time Calculation (min) (ACUTE ONLY): 10 min   Charges:   PT Evaluation $PT Eval Moderate Complexity: 1 Mod     PT G Codes:       Mabeline Caras, PT, DPT Acute Rehab Services  Pager: [place] 02/26/2018, 12:37 PM

## 2018-02-26 NOTE — Progress Notes (Signed)
PROGRESS NOTE    Natalie Morrison  GLO:[id number] DOB: [birth year] DOA: 02/22/2018 PCP: Eulas Post, MD   Brief Narrative: 47 year old WF PMHx EtOH abuse, Anxiety, Depression, Asthma, Hepatitis A, HTN  Presenting with shortness of breath and fever.  Recently hospitalized from 4/25 to 1/88, course complicated by pneumonia, ARDS, UTI, and EtOH withdrawal. She was reintubated several times for self extubation and stridor. Her blood cultures and cultures from BAL on 4/29 were negative. She was discharged home on room air with cipro and diflucan.   She reports she was feeling well except using her inhaler more since 5/18. She reported fever, chills, nonproductive cough, congestion, ear pain, and increasing shortness of breath since yesterday. Additionally, she denies smoking, but states she has had a few alcoholic drinks at home, including yesterday, because she thought she could possibly be withdrawing.   In the ER, she was febrile, 103.8, borderline hypotensive, tachycardic, tachypneic ranging from 30-40's, and initially hypoxic on room air at 87% requiring ventri mask. Labs noted for WBC 42.0 with left shift, Hgb 10.9, K 2.9, CO2 14, glucose 159, AG 17, albumin 2.9, protein 6.4, neg troponin x 2, lactic 4.01, which has improved to 1.08 after IVF. UA neg, and CXR appears slightly improved from prior, but still noted for persistent left mid and lower opacities. Currently patient remains borderline hypotensive despite almost 4L NS and remains significantly tachypneic. Blood and urine cultures sent and started on vancomycin and zosyn. PCCM called by [facility] for possible admission versus consult; will admit to ICU.    Assessment & Plan:   Active Problems:   Acute respiratory failure with hypoxia (HCC)   Sepsis (HCC)   Pressure injury of skin  Acute respiratory failure with hypoxia/ARDS/HCAP Multifactorial related to Aspiration PNA, in setting of alcohol intoxication, vs  pneumonitis.  Will repeat chest x ray tomorrow,. On IV lasix. Weight 130 pounds. Prior weight 120.  On Augmentin. WBC increase today , but she is on IV steroids. Taper steroids. If spike fevr will need to broad antibiotics. She appears to be improving clinically./  Blood culture; no growth to date.  Received 2 days of Primaxin and vancomycin and today is day 3 of Augmentin.   Acute on chronic diastolic HF;  On IV lasix.  She was not using lasix PRN at home.  Weight 130 pounds. Weight on last discharge from hospital was 120/.   Lactic acidosis; resolved.  Dysphasia -5/23 Modified Barium Swallow; cleared for regular diet.  Anemia;  Iron; 52, ferritin; 429, B12. 998 Hb stable at 9.    Hyperglycemia from steroids. Taper steroids.  HB A1c at 5.7  Alcohol use ;  Counseling provide.  SW consulted, to see patient on 5-26 patient preference.    Anxiety/Depression -Start Prozac 20 mg daily  -Currently on Ativan PRN      DVT prophylaxis: Heparin  Code Status: full code.  Family Communication: care discussed with patient  Disposition Plan: home when respiratory status improved.    Consultants:   none   Procedures:   Procedures/Significant Events:  5/21 Echocardiogram;Left ventricle: LVEF= 65% to 70%.--(grade 1 diastolic dysfunction). 5/21 CTA chest PE protocol: PE-Moderate symmetric patchy ground-glass opacities in a slight upper lobe and anterior prominent distribution with mild septal: sequela of recent ARDS is favored.-  Mild bilateral hilar adenopathy is likely reactive. 5/21 CXR  > b/l opacities. 5/21 Abd US> no acute process.  Autoimmune labs: Ds-DNA neg, anti jo neg, anti Scl neg, RF neg, ANCA neg,  ANA neg, HSP panel pending   Antimicrobials:   Augmentin    Subjective: She is feeling ok. Denies dyspnea or worsening cough. Denies diarrhea or dysuria.   Objective: Vitals:   02/25/18 1718 02/26/18 0121 02/26/18 0141 02/26/18 0428  BP: 108/73  127/89    Pulse: 96 78    Resp: (!) 24 20    Temp: 97.8 F (36.6 C) 97.8 F (36.6 C)    TempSrc:  Oral    SpO2: 94% 94% 95%   Weight:    59.4 kg (130 lb 15.3 oz)  Height:        Intake/Output Summary (Last 24 hours) at 02/26/2018 0720 Last data filed at 02/25/2018 1841 Gross per 24 hour  Intake 240 ml  Output -  Net 240 ml   Filed Weights   02/23/18 0500 02/25/18 0500 02/26/18 0428  Weight: 59.5 kg (131 lb 2.8 oz) 58.1 kg (128 lb 1.4 oz) 59.4 kg (130 lb 15.3 oz)    Examination:  General exam: Appears calm and comfortable  Respiratory system: Clear to auscultation. Respiratory effort normal. Cardiovascular system: S1 & S2 heard, RRR. No JVD, murmurs, rubs, gallops or clicks. No pedal edema. Gastrointestinal system: Abdomen is nondistended, soft and nontender. No organomegaly or masses felt. Normal bowel sounds heard. Central nervous system: Alert and oriented. No focal neurological deficits. Extremities: Symmetric 5 x 5 power. Skin: No rashes, lesions or ulcers Psychiatry: Judgement and insight appear normal. Mood & affect appropriate.     Data Reviewed: I have personally reviewed following labs and imaging studies  CBC: Recent Labs  Lab 02/22/18 0138 02/22/18 0452 02/25/18 0327 02/26/18 0035  WBC 42.1* 37.2* 20.3* 36.6*  NEUTROABS 37.1*  --   --   --   HGB 10.9* 9.3* 9.4* 9.4*  HCT 32.4* 28.6* 28.6* 28.7*  MCV 99.7 103.2* 101.8* 101.4*  PLT 444* 272 353 832*   Basic Metabolic Panel: Recent Labs  Lab 02/22/18 0452 02/22/18 1352 02/23/18 0812 02/24/18 0306 02/24/18 1541 02/25/18 0327 02/26/18 0035  NA 138 139 139 138 135 135 137  K 2.8* 3.7 3.0* 3.3* 4.1 4.3 4.3  CL 110 112* 108 104 97* 97* 98*  CO2 18* 20* _0 GLUCOSE 107* 138* 99 113* 273* 296* 271*  BUN 5* <5* <5* _1 CREATININE 0.62 0.52 0.56 0.52 0.54 0.64 0.69  CALCIUM 7.5* 7.7* 8.3* 8.5* 8.6* 8.8* 9.1  MG 1.1* 1.9 1.6* 1.4*  --  1.9 1.7  PHOS 3.5  --   --   --   --  4.2  --     GFR: Estimated Creatinine Clearance: 77.4 mL/min (by C-G formula based on SCr of 0.69 mg/dL). Liver Function Tests: Recent Labs  Lab 02/22/18 0138  AST 16  ALT 14  ALKPHOS 100  BILITOT 0.7  PROT 6.4*  ALBUMIN 2.9*   Recent Labs  Lab 02/22/18 0452  LIPASE 23   No results for input(s): AMMONIA in the last 168 hours. Coagulation Profile: Recent Labs  Lab 02/22/18 0138  INR 1.16   Cardiac Enzymes: Recent Labs  Lab 02/22/18 0139 02/22/18 1352  CKTOTAL  --  10*  TROPONINI <0.03  --    BNP (last 3 results) No results for input(s): PROBNP in the last 8760 hours. HbA1C: Recent Labs    02/25/18 1726  HGBA1C 5.7*   CBG: Recent Labs  Lab 02/24/18 1547 02/24/18 2009 02/25/18 0821 02/25/18 2039 02/26/18 0118  GLUCAP 264* 236*  174* 186* 226*   Lipid Profile: Recent Labs    02/25/18 1727  CHOL 169  HDL 25*  LDLCALC 104*  TRIG 202*  CHOLHDL 6.8   Thyroid Function Tests: No results for input(s): TSH, T4TOTAL, FREET4, T3FREE, THYROIDAB in the last 72 hours. Anemia Panel: Recent Labs    02/25/18 0826  VITAMINB12 998*  FOLATE 14.1  FERRITIN 429*  TIBC 232*  IRON 52  RETICCTPCT 3.8*   Sepsis Labs: Recent Labs  Lab 02/22/18 0154 02/22/18 0346 02/22/18 0420 02/22/18 0452 02/23/18 0812 02/24/18 0306  PROCALCITON  --   --   --  0.57 0.56 0.37  LATICACIDVEN 4.01* 1.08 1.2 1.1  --   --     Recent Results (from the past 240 hour(s))  Culture, blood (Routine x 2)     Status: None (Preliminary result)   Collection Time: 02/22/18  1:56 AM  Result Value Ref Range Status   Specimen Description BLOOD LEFT HAND  Final   Special Requests   Final    BOTTLES DRAWN AEROBIC AND ANAEROBIC Blood Culture results may not be optimal due to an inadequate volume of blood received in culture bottles   Culture   Final    NO GROWTH 3 DAYS Performed at [facility], [place] [address], [place], [place] [address]    Report Status PENDING  Incomplete  Culture,  blood (Routine x 2)     Status: None (Preliminary result)   Collection Time: 02/22/18  1:56 AM  Result Value Ref Range Status   Specimen Description BLOOD LEFT FOREARM  Final   Special Requests   Final    BOTTLES DRAWN AEROBIC AND ANAEROBIC Blood Culture adequate volume   Culture   Final    NO GROWTH 3 DAYS Performed at [facility], [place] [address] [place], [place] [address]    Report Status PENDING  Incomplete  Urine culture     Status: None   Collection Time: 02/22/18  3:00 AM  Result Value Ref Range Status   Specimen Description URINE, RANDOM  Final   Special Requests NONE  Final   Culture   Final    NO GROWTH Performed at [facility], [address] [place], [place] [address]    Report Status 02/23/2018 FINAL  Final  Respiratory Panel by PCR     Status: None   Collection Time: 02/22/18  6:32 AM  Result Value Ref Range Status   Adenovirus NOT DETECTED NOT DETECTED Final   Coronavirus 229E NOT DETECTED NOT DETECTED Final   Coronavirus HKU1 NOT DETECTED NOT DETECTED Final   Coronavirus NL63 NOT DETECTED NOT DETECTED Final   Coronavirus OC43 NOT DETECTED NOT DETECTED Final   Metapneumovirus NOT DETECTED NOT DETECTED Final   Rhinovirus / Enterovirus NOT DETECTED NOT DETECTED Final   Influenza A NOT DETECTED NOT DETECTED Final   Influenza B NOT DETECTED NOT DETECTED Final   Parainfluenza Virus 1 NOT DETECTED NOT DETECTED Final   Parainfluenza Virus 2 NOT DETECTED NOT DETECTED Final   Parainfluenza Virus 3 NOT DETECTED NOT DETECTED Final   Parainfluenza Virus 4 NOT DETECTED NOT DETECTED Final   Respiratory Syncytial Virus NOT DETECTED NOT DETECTED Final   Bordetella pertussis NOT DETECTED NOT DETECTED Final   Chlamydophila pneumoniae NOT DETECTED NOT DETECTED Final   Mycoplasma pneumoniae NOT DETECTED NOT DETECTED Final    Comment: Performed at [facility], [place] [address], [place], [place] [address]  MRSA PCR Screening  Status: Abnormal   Collection  Time: 02/22/18  6:32 AM  Result Value Ref Range Status   MRSA by PCR INVALID RESULTS, SPECIMEN SENT FOR CULTURE (A) NEGATIVE Final    Comment:        The GeneXpert MRSA Assay (FDA approved for NASAL specimens only), is one component of a comprehensive MRSA colonization surveillance program. It is not intended to diagnose MRSA infection nor to guide or monitor treatment for MRSA infections. Performed at [facility], [place] [address], [place], [place] [address]   MRSA culture     Status: None   Collection Time: 02/22/18  7:32 AM  Result Value Ref Range Status   Specimen Description NASAL SWAB  Final   Special Requests NONE  Final   Culture   Final    NO MRSA DETECTED Performed at [facility], [address], [place], [place] [address]    Report Status 02/23/2018 FINAL  Final         Radiology Studies: Dg Swallowing Func-speech Pathology  Result Date: 02/24/2018 Objective Swallowing Evaluation: Type of Study: MBS-Modified Barium Swallow Study  Patient Details Name: CONCHA SUDOL MRN: [id number] Date of Birth: 03-Mar-1971 Today's Date: 02/24/2018 Time: SLP Start Time (ACUTE ONLY): 1007 -SLP Stop Time (ACUTE ONLY): 1043 SLP Time Calculation (min) (ACUTE ONLY): 36 min Past Medical History: Past Medical History: Diagnosis Date . Alcohol abuse  . Anemia  . Anxiety  . Anxiety and depression  . Asthma  . Depression  . Hepatitis A   "when I was a kid" . Hypertension  Past Surgical History: Past Surgical History: Procedure Laterality Date . BREAST LUMPECTOMY Right  . TONSILLECTOMY AND ADENOIDECTOMY Bilateral over 30 years ago HPI: Pt is a 47 year old female with PMH of alcohol abuse, asthma, anxiety and depression presenting with shortness of breath and fever. She was recently hospitalized from 4/25 to 5/70, course complicated by pneumonia, ARDS, UTI, and alcohol withdrawal.  She was reintubated several times for self extubation and stridor. SLP saw at that time with evidence of  post-extubation/cognitive dysphagia with MBS recommending Dys 2 diet, honey thick liquids by spoon.  Subjective: pt remembers SLP from previous admission and remembers the need for thickened liquids but says she was not consistently thickening liquids at home Assessment / Plan / Recommendation CHL IP CLINICAL IMPRESSIONS 02/24/2018 Clinical Impression Pt shows improvements since MBS last admission, likely secondary to improved mentation and increased time post-extubation. Her oral phase is now [facility]. She continues to have small amounts of thin liquids that enter her laryngeal vestibule during the swallow, suspect from decreased glottal closure given that her vocal quality remains hoarse. Liquids contact the vocal folds but are ejected with a delayed cough. It is possible that some liquid could spill further into the trachea before she elicits a cough, but this was not observed today. She achieved better airway protection with no penetration or aspiration when using a chin tuck with thin liquids. Solids also were cleared from her pharynx with good efficiency and airway protection. Recommend that pt eat unrestricted textures and liquids, but would focus more on aspiration precautions and swallowing strategies as outlined below. Would administer pills whole in puree to facilitate esophageal transit, as was needed on testing today. SLP will f/u for tolerance and to increase utilization of strategies. SLP Visit Diagnosis Dysphagia, pharyngeal phase (R13.13) Attention and concentration deficit following -- Frontal lobe and executive function deficit following -- Impact on safety and function Mild aspiration risk  CHL IP TREATMENT RECOMMENDATION 02/24/2018 Treatment Recommendations Therapy as outlined in treatment plan below   Prognosis 02/24/2018 Prognosis for Safe Diet Advancement Good Barriers to Reach Goals -- Barriers/Prognosis Comment -- CHL IP DIET RECOMMENDATION 02/24/2018 SLP Diet Recommendations Regular solids;Thin  liquid Liquid Administration via Cup;Straw Medication Administration Whole meds with puree Compensations Slow rate;Small sips/bites;Chin tuck;Other (Comment) Postural Changes Seated upright at 90 degrees;Remain semi-upright after after feeds/meals (Comment)   CHL IP OTHER RECOMMENDATIONS 02/24/2018 Recommended Consults -- Oral Care Recommendations Oral care BID Other Recommendations --   CHL IP FOLLOW UP RECOMMENDATIONS 02/24/2018 Follow up Recommendations (No Data)   CHL IP FREQUENCY AND DURATION 02/24/2018 Speech Therapy Frequency (ACUTE ONLY) min 2x/week Treatment Duration 2 weeks      CHL IP ORAL PHASE 02/24/2018 Oral Phase WFL Oral - Pudding Teaspoon -- Oral - Pudding Cup -- Oral - Honey Teaspoon -- Oral - Honey Cup -- Oral - Nectar Teaspoon -- Oral - Nectar Cup -- Oral - Nectar Straw -- Oral - Thin Teaspoon -- Oral - Thin Cup -- Oral - Thin Straw -- Oral - Puree -- Oral - Mech Soft -- Oral - Regular -- Oral - Multi-Consistency -- Oral - Pill -- Oral Phase - Comment --  CHL IP PHARYNGEAL PHASE 02/24/2018 Pharyngeal Phase Impaired Pharyngeal- Pudding Teaspoon -- Pharyngeal -- Pharyngeal- Pudding Cup -- Pharyngeal -- Pharyngeal- Honey Teaspoon -- Pharyngeal -- Pharyngeal- Honey Cup NT Pharyngeal -- Pharyngeal- Nectar Teaspoon -- Pharyngeal -- Pharyngeal- Nectar Cup NT Pharyngeal -- Pharyngeal- Nectar Straw NT Pharyngeal -- Pharyngeal- Thin Teaspoon NT Pharyngeal -- Pharyngeal- Thin Cup Reduced airway/laryngeal closure;Penetration/Aspiration during swallow Pharyngeal Material enters airway, CONTACTS cords and then ejected out Pharyngeal- Thin Straw Reduced airway/laryngeal closure;Penetration/Aspiration during swallow Pharyngeal Material enters airway, CONTACTS cords and then ejected out Pharyngeal- Puree Reduced airway/laryngeal closure Pharyngeal -- Pharyngeal- Mechanical Soft Reduced airway/laryngeal closure Pharyngeal -- Pharyngeal- Regular -- Pharyngeal -- Pharyngeal- Multi-consistency -- Pharyngeal -- Pharyngeal-  Pill Reduced airway/laryngeal closure Pharyngeal -- Pharyngeal Comment --  CHL IP CERVICAL ESOPHAGEAL PHASE 02/24/2018 Cervical Esophageal Phase WFL Pudding Teaspoon -- Pudding Cup -- Honey Teaspoon -- Honey Cup -- Nectar Teaspoon -- Nectar Cup -- Nectar Straw -- Thin Teaspoon -- Thin Cup -- Thin Straw -- Puree -- Mechanical Soft -- Regular -- Multi-consistency -- Pill -- Cervical Esophageal Comment -- No flowsheet data found. Germain Osgood 02/24/2018, 11:20 AM  Germain Osgood, M.A. CCC-SLP [phone]                  Scheduled Meds: . amoxicillin-clavulanate  875 mg of amoxicillin Oral BID  . collagenase   Topical Daily  . FLUoxetine  20 mg Oral Daily  . folic acid  1 mg Oral Daily  . furosemide  40 mg Intravenous Q12H  . heparin  5,000 Units Subcutaneous Q8H  . insulin aspart  0-9 Units Subcutaneous TID WC  . ipratropium-albuterol  3 mL Nebulization Q6H  . mouth rinse  15 mL Mouth Rinse BID  . methylPREDNISolone (SOLU-MEDROL) injection  60 mg Intravenous Q6H  . nicotine  21 mg Transdermal Daily  . thiamine  100 mg Oral Daily   Continuous Infusions: . sodium chloride       LOS: 4 days    Time spent: 35 minutes.     Elmarie Shiley, MD [facility] Pager [phone]  If 7PM-7AM, please contact night-coverage [email or web address] Password [email or web address] 02/26/2018, 7:20 AM

## 2018-02-26 NOTE — Evaluation (Signed)
Occupational Therapy Evaluation Patient Details Name: Natalie Morrison MRN: [id number] DOB: 1971/02/21 Today's Date: 02/26/2018    History of Present Illness 47 year old female with PMH of alcohol abuse, asthma, anxiety and depression presented with shortness of breath and fever.   Clinical Impression   Pt admitted for above. Unsure of pt's PLOF but HHOT was lined up prior to this admission. Feel pt will benefit from acute OT to increase independence prior to d/c.      Follow Up Recommendations  Home health OT;Supervision/Assistance - 24 hour    Equipment Recommendations  None recommended by OT    Recommendations for Other Services       Precautions / Restrictions Precautions Precautions: Fall Restrictions Weight Bearing Restrictions: No      Mobility Bed Mobility Overal bed mobility: Independent                Transfers Overall transfer level: Needs assistance   Transfers: Sit to/from Stand Sit to Stand: Min guard              Balance                                           ADL either performed or assessed with clinical judgement   ADL Overall ADL's : Needs assistance/impaired     Grooming: Wash/dry face;Oral care;Standing;Set up;Supervision/safety(Min guard to ambulate to sink)               Lower Body Dressing: Sit to/from stand;Min guard   Toilet Transfer: Min guard;Ambulation(sit to stand from bed)   Toileting- Clothing Manipulation and Hygiene: Sit to/from stand;Min guard       Functional mobility during ADLs: Min guard General ADL Comments: Educated on safety such as sitting for LB ADLs, rugs/items on floor, and having someone with her for shower transfer, and safe footwear.      Vision         Perception     Praxis      Pertinent Vitals/Pain Pain Assessment: No/denies pain     Hand Dominance     Extremity/Trunk Assessment Upper Extremity Assessment Upper Extremity Assessment: Generalized  weakness(weakness in bilateral shoulder flexors)   Lower Extremity Assessment Lower Extremity Assessment: Defer to PT evaluation       Communication Communication Communication: No difficulties   Cognition Arousal/Alertness: Awake/alert Behavior During Therapy: Flat affect;WFL for tasks assessed/performed Overall Cognitive Status: No family/caregiver present to determine baseline cognitive functioning                                     General Comments       Exercises     Shoulder Instructions      Home Living Family/patient expects to be discharged to:: Private residence Living Arrangements: Spouse/significant other Available Help at Discharge: Family(unsure if she really has 24/7 assist available) Type of Home: House Home Access: Stairs to enter [employer] of Steps: 4 Entrance Stairs-Rails: Right;Left;Can reach both Home Layout: One level     Bathroom Shower/Tub: Teacher, early years/pre: Standard     Home Equipment: Cane - single point;Grab bars - tub/shower;Hand held shower head;Shower seat          Prior Functioning/Environment          Comments: unsure of PLOF  OT Problem List: Decreased strength;Impaired balance (sitting and/or standing);Decreased safety awareness;Decreased knowledge of precautions      OT Treatment/Interventions: Self-care/ADL training;DME and/or AE instruction;Therapeutic activities;Cognitive remediation/compensation;Patient/family education;Balance training;Therapeutic exercise    OT Goals(Current goals can be found in the care plan section) Acute Rehab OT Goals Patient Stated Goal: go home OT Goal Formulation: With patient Time For Goal Achievement: 03/05/18 Potential to Achieve Goals: Good ADL Goals Pt Will Transfer to Toilet: ambulating;regular height toilet;with modified independence Additional ADL Goal #1: Pt will perform UE exercises independently to increase UB  strength. Additional ADL Goal #2: Pt will gather clothing/grooming items with supervision from high/low surfaces to work on high level balance.  OT Frequency: Min 2X/week   Barriers to D/C:            Co-evaluation              AM-PAC PT "6 Clicks" Daily Activity     Outcome Measure Help from another person eating meals?: None Help from another person taking care of personal grooming?: A Little Help from another person toileting, which includes using toliet, bedpan, or urinal?: A Little Help from another person bathing (including washing, rinsing, drying)?: A Little Help from another person to put on and taking off regular upper body clothing?: A Little Help from another person to put on and taking off regular lower body clothing?: A Little 6 Click Score: 19   End of Session Equipment Utilized During Treatment: Gait belt Nurse Communication: Other (comment)(spoke with tech and he went in to set her bed alarm)  Activity Tolerance: Patient tolerated treatment well Patient left: in bed;with call bell/phone within reach  OT Visit Diagnosis: Unsteadiness on feet (R26.81)                Time: 1505-6979 OT Time Calculation (min): 13 min Charges:  OT General Charges $OT Visit: 1 Visit OT Evaluation $OT Eval Moderate Complexity: 1 Mod G-Codes:      Tessia Kassin L Sire Poet OTR/L 02/26/2018, 11:54 AM

## 2018-02-27 ENCOUNTER — Inpatient Hospital Stay (HOSPITAL_COMMUNITY): Payer: BLUE CROSS/BLUE SHIELD

## 2018-02-27 LAB — CBC
HCT: 29.2 % — ABNORMAL LOW (ref 36.0–46.0)
Hemoglobin: 9.6 g/dL — ABNORMAL LOW (ref 12.0–15.0)
MCH: 33.7 pg (ref 26.0–34.0)
MCHC: 32.9 g/dL (ref 30.0–36.0)
MCV: 102.5 fL — ABNORMAL HIGH (ref 78.0–100.0)
Platelets: 440 10*3/uL — ABNORMAL HIGH (ref 150–400)
RBC: 2.85 MIL/uL — ABNORMAL LOW (ref 3.87–5.11)
RDW: 13.9 % (ref 11.5–15.5)
WBC: 35.2 10*3/uL — ABNORMAL HIGH (ref 4.0–10.5)

## 2018-02-27 LAB — CULTURE, BLOOD (ROUTINE X 2)
Culture: NO GROWTH
Culture: NO GROWTH
Special Requests: ADEQUATE

## 2018-02-27 LAB — MAGNESIUM: Magnesium: 1.9 mg/dL (ref 1.7–2.4)

## 2018-02-27 LAB — BASIC METABOLIC PANEL
Anion gap: 10 (ref 5–15)
BUN: 26 mg/dL — ABNORMAL HIGH (ref 6–20)
CO2: 28 mmol/L (ref 22–32)
Calcium: 8.8 mg/dL — ABNORMAL LOW (ref 8.9–10.3)
Chloride: 96 mmol/L — ABNORMAL LOW (ref 101–111)
Creatinine, Ser: 0.7 mg/dL (ref 0.44–1.00)
GFR calc Af Amer: >60 mL/min (ref >60)
GFR calc non Af Amer: >60 mL/min (ref >60)
Glucose, Bld: 158 mg/dL — ABNORMAL HIGH (ref 65–99)
Potassium: 4.1 mmol/L (ref 3.5–5.1)
Sodium: 134 mmol/L — ABNORMAL LOW (ref 135–145)

## 2018-02-27 LAB — GLUCOSE, CAPILLARY
Glucose-Capillary: 141 mg/dL — ABNORMAL HIGH (ref 65–99)
Glucose-Capillary: 141 mg/dL — ABNORMAL HIGH (ref 65–99)
Glucose-Capillary: 233 mg/dL — ABNORMAL HIGH (ref 65–99)
Glucose-Capillary: 170 mg/dL — ABNORMAL HIGH (ref 65–99)

## 2018-02-27 MED ORDER — IPRATROPIUM-ALBUTEROL 0.5-2.5 (3) MG/3ML IN SOLN
3.0000 mL | Freq: Two times a day (BID) | RESPIRATORY_TRACT | Status: DC
  Filled 2018-02-27: qty 3

## 2018-02-27 MED ORDER — IPRATROPIUM-ALBUTEROL 0.5-2.5 (3) MG/3ML IN SOLN
3.0000 mL | Freq: Four times a day (QID) | RESPIRATORY_TRACT | Status: DC | PRN
Start: 2018-02-27 — End: 2018-03-01

## 2018-02-27 MED ORDER — METHYLPREDNISOLONE SODIUM SUCC 40 MG IJ SOLR
40.0000 mg | Freq: Two times a day (BID) | INTRAMUSCULAR | Status: DC
  Administered 2018-02-27: 40 mg via INTRAVENOUS
  Filled 2018-02-27: qty 1

## 2018-02-27 MED ORDER — PIPERACILLIN-TAZOBACTAM 3.375 G IVPB
3.3750 g | Freq: Three times a day (TID) | INTRAVENOUS | Status: DC
  Administered 2018-02-27 – 2018-03-01 (×6): 3.375 g via INTRAVENOUS
  Filled 2018-02-27 (×7): qty 50

## 2018-02-27 NOTE — Progress Notes (Signed)
PROGRESS NOTE    Natalie Morrison  BHA:[id number] DOB: 1971/02/21 DOA: 02/22/2018 PCP: Eulas Post, MD   Brief Narrative: 47 year old WF PMHx EtOH abuse, Anxiety, Depression, Asthma, Hepatitis A, HTN  Presenting with shortness of breath and fever.  Recently hospitalized from 4/25 to 9/73, course complicated by pneumonia, ARDS, UTI, and EtOH withdrawal. She was reintubated several times for self extubation and stridor. Her blood cultures and cultures from BAL on 4/29 were negative. She was discharged home on room air with cipro and diflucan.   She reports she was feeling well except using her inhaler more since 5/18. She reported fever, chills, nonproductive cough, congestion, ear pain, and increasing shortness of breath since yesterday. Additionally, she denies smoking, but states she has had a few alcoholic drinks at home, including yesterday, because she thought she could possibly be withdrawing.   In the ER, she was febrile, 103.8, borderline hypotensive, tachycardic, tachypneic ranging from 30-40's, and initially hypoxic on room air at 87% requiring ventri mask. Labs noted for WBC 42.0 with left shift, Hgb 10.9, K 2.9, CO2 14, glucose 159, AG 17, albumin 2.9, protein 6.4, neg troponin x 2, lactic 4.01, which has improved to 1.08 after IVF. UA neg, and CXR appears slightly improved from prior, but still noted for persistent left mid and lower opacities. Currently patient remains borderline hypotensive despite almost 4L NS and remains significantly tachypneic. Blood and urine cultures sent and started on vancomycin and zosyn. PCCM called by [facility] for possible admission versus consult; will admit to ICU.    Assessment & Plan:   Active Problems:   Acute respiratory failure with hypoxia (HCC)   Sepsis (HCC)   Pressure injury of skin  Acute respiratory failure with hypoxia/ARDS/HCAP Multifactorial related to Aspiration PNA, in setting of alcohol intoxication, vs  pneumonitis.  Will repeat chest x ray tomorrow,. On IV lasix. Weight 128 pounds. Prior weight 120.  On Augmentin. WBC increase today , but she is on IV steroids. Taper steroids. If spike fevr will need to broad antibiotics. She appears to be improving clinically./  Blood culture; no growth to date.  Received 2 days of Primaxin and vancomycin and  3 of Augmentin.  WBC still in the 30 K range. Will change oral antibiotics to IV zosyn, ' Continue with I lasix. Continue to taper Solumedrol/   Acute on chronic diastolic HF;  On IV lasix.  She was not using lasix PRN at home.  Weight 128 pounds. Weight on last discharge from hospital was 120/.   Lactic acidosis; resolved.  Dysphasia -5/23 Modified Barium Swallow; cleared for regular diet.  Anemia;  Iron; 52, ferritin; 429, B12. 998 Hb stable at 9.    Hyperglycemia from steroids. Taper steroids.  HB A1c at 5.7  Alcohol use ;  Counseling provide.  SW consulted, to see patient on 5-26 patient preference.    Anxiety/Depression -Start Prozac 20 mg daily  -Currently on Ativan PRN      DVT prophylaxis: Heparin  Code Status: full code.  Family Communication: care discussed with patient  Disposition Plan: home when respiratory status improved.    Consultants:   none   Procedures:   Procedures/Significant Events:  5/21 Echocardiogram;Left ventricle: LVEF= 65% to 70%.--(grade 1 diastolic dysfunction). 5/21 CTA chest PE protocol: PE-Moderate symmetric patchy ground-glass opacities in a slight upper lobe and anterior prominent distribution with mild septal: sequela of recent ARDS is favored.-  Mild bilateral hilar adenopathy is likely reactive. 5/21 CXR  > b/l  opacities. 5/21 Abd US> no acute process.  Autoimmune labs: Ds-DNA neg, anti jo neg, anti Scl neg, RF neg, ANCA neg, ANA neg, HSP panel pending   Antimicrobials:   Augmentin    Subjective: She is breathing better, denies abdominal pain or chest pain     Objective: Vitals:   02/27/18 0015 02/27/18 0500 02/27/18 0729 02/27/18 0828  BP: 128/87   106/75  Pulse: (!) 59  68 86  Resp: _0 Temp: (!) 97.5 F (36.4 C)   97.9 F (36.6 C)  TempSrc: Oral   Oral  SpO2: 96%  97% 97%  Weight:  58.2 kg (128 lb 4.9 oz)    Height:        Intake/Output Summary (Last 24 hours) at 02/27/2018 1217 Last data filed at 02/27/2018 0200 Gross per 24 hour  Intake 480 ml  Output 500 ml  Net -20 ml   Filed Weights   02/25/18 0500 02/26/18 0428 02/27/18 0500  Weight: 58.1 kg (128 lb 1.4 oz) 59.4 kg (130 lb 15.3 oz) 58.2 kg (128 lb 4.9 oz)    Examination:  General exam: NAD Respiratory system: CTA Cardiovascular system: S 1, S 2 RRR Gastrointestinal system: BS present, soft, nt Central nervous system; non focal.  Extremities: Symmetric power.  Skin: No rashes, lesions or ulcers    Data Reviewed: I have personally reviewed following labs and imaging studies  CBC: Recent Labs  Lab 02/22/18 0138 02/22/18 0452 02/25/18 0327 02/26/18 0035 02/27/18 0250  WBC 42.1* 37.2* 20.3* 36.6* 35.2*  NEUTROABS 37.1*  --   --   --   --   HGB 10.9* 9.3* 9.4* 9.4* 9.6*  HCT 32.4* 28.6* 28.6* 28.7* 29.2*  MCV 99.7 103.2* 101.8* 101.4* 102.5*  PLT 444* 272 353 401* 449*   Basic Metabolic Panel: Recent Labs  Lab 02/22/18 0452  02/23/18 0812 02/24/18 0306 02/24/18 1541 02/25/18 0327 02/26/18 0035 02/27/18 0250  NA 138   < > 139 138 135 135 137 134*  K 2.8*   < > 3.0* 3.3* 4.1 4.3 4.3 4.1  CL 110   < > 108 104 97* 97* 98* 96*  CO2 18*   < > _1 GLUCOSE 107*   < > 99 113* 273* 296* 271* 158*  BUN 5*   < > <5* _2 26*  CREATININE 0.62   < > 0.56 0.52 0.54 0.64 0.69 0.70  CALCIUM 7.5*   < > 8.3* 8.5* 8.6* 8.8* 9.1 8.8*  MG 1.1*   < > 1.6* 1.4*  --  1.9 1.7 1.9  PHOS 3.5  --   --   --   --  4.2  --   --    < > = values in this interval not displayed.   GFR: Estimated Creatinine Clearance: 77.4 mL/min (by C-G formula  based on SCr of 0.7 mg/dL). Liver Function Tests: Recent Labs  Lab 02/22/18 0138  AST 16  ALT 14  ALKPHOS 100  BILITOT 0.7  PROT 6.4*  ALBUMIN 2.9*   Recent Labs  Lab 02/22/18 0452  LIPASE 23   No results for input(s): AMMONIA in the last 168 hours. Coagulation Profile: Recent Labs  Lab 02/22/18 0138  INR 1.16   Cardiac Enzymes: Recent Labs  Lab 02/22/18 0139 02/22/18 1352  CKTOTAL  --  10*  TROPONINI <0.03  --    BNP (last 3 results) No results for input(s): PROBNP in  the last 8760 hours. HbA1C: Recent Labs    02/25/18 1726  HGBA1C 5.7*   CBG: Recent Labs  Lab 02/26/18 0732 02/26/18 1255 02/26/18 1638 02/26/18 2134 02/27/18 0755  GLUCAP 154* 141* 245* 137* 141*   Lipid Profile: Recent Labs    02/25/18 1727  CHOL 169  HDL 25*  LDLCALC 104*  TRIG 202*  CHOLHDL 6.8   Thyroid Function Tests: No results for input(s): TSH, T4TOTAL, FREET4, T3FREE, THYROIDAB in the last 72 hours. Anemia Panel: Recent Labs    02/25/18 0826  VITAMINB12 998*  FOLATE 14.1  FERRITIN 429*  TIBC 232*  IRON 52  RETICCTPCT 3.8*   Sepsis Labs: Recent Labs  Lab 02/22/18 0154 02/22/18 0346 02/22/18 0420 02/22/18 0452 02/23/18 0812 02/24/18 0306  PROCALCITON  --   --   --  0.57 0.56 0.37  LATICACIDVEN 4.01* 1.08 1.2 1.1  --   --     Recent Results (from the past 240 hour(s))  Culture, blood (Routine x 2)     Status: None   Collection Time: 02/22/18  1:56 AM  Result Value Ref Range Status   Specimen Description BLOOD LEFT HAND  Final   Special Requests   Final    BOTTLES DRAWN AEROBIC AND ANAEROBIC Blood Culture results may not be optimal due to an inadequate volume of blood received in culture bottles   Culture   Final    NO GROWTH 5 DAYS Performed at [facility], [place] [address], [place], [place] [address]    Report Status 02/27/2018 FINAL  Final  Culture, blood (Routine x 2)     Status: None   Collection Time: 02/22/18  1:56 AM  Result Value Ref  Range Status   Specimen Description BLOOD LEFT FOREARM  Final   Special Requests   Final    BOTTLES DRAWN AEROBIC AND ANAEROBIC Blood Culture adequate volume   Culture   Final    NO GROWTH 5 DAYS Performed at [facility], [place] [address], [place], [place] [address]    Report Status 02/27/2018 FINAL  Final  Urine culture     Status: None   Collection Time: 02/22/18  3:00 AM  Result Value Ref Range Status   Specimen Description URINE, RANDOM  Final   Special Requests NONE  Final   Culture   Final    NO GROWTH Performed at [facility], [place] [address], [place], [place] [address]    Report Status 02/23/2018 FINAL  Final  Respiratory Panel by PCR     Status: None   Collection Time: 02/22/18  6:32 AM  Result Value Ref Range Status   Adenovirus NOT DETECTED NOT DETECTED Final   Coronavirus 229E NOT DETECTED NOT DETECTED Final   Coronavirus HKU1 NOT DETECTED NOT DETECTED Final   Coronavirus NL63 NOT DETECTED NOT DETECTED Final   Coronavirus OC43 NOT DETECTED NOT DETECTED Final   Metapneumovirus NOT DETECTED NOT DETECTED Final   Rhinovirus / Enterovirus NOT DETECTED NOT DETECTED Final   Influenza A NOT DETECTED NOT DETECTED Final   Influenza B NOT DETECTED NOT DETECTED Final   Parainfluenza Virus 1 NOT DETECTED NOT DETECTED Final   Parainfluenza Virus 2 NOT DETECTED NOT DETECTED Final   Parainfluenza Virus 3 NOT DETECTED NOT DETECTED Final   Parainfluenza Virus 4 NOT DETECTED NOT DETECTED Final   Respiratory Syncytial Virus NOT DETECTED NOT DETECTED Final   Bordetella pertussis NOT DETECTED NOT DETECTED Final   Chlamydophila pneumoniae NOT  DETECTED NOT DETECTED Final   Mycoplasma pneumoniae NOT DETECTED NOT DETECTED Final    Comment: Performed at [facility], [place] [address] [place], [place] [address]  MRSA PCR Screening     Status: Abnormal   Collection Time: 02/22/18  6:32 AM  Result Value Ref Range Status   MRSA by PCR INVALID RESULTS, SPECIMEN SENT FOR CULTURE  (A) NEGATIVE Final    Comment:        The GeneXpert MRSA Assay (FDA approved for NASAL specimens only), is one component of a comprehensive MRSA colonization surveillance program. It is not intended to diagnose MRSA infection nor to guide or monitor treatment for MRSA infections. Performed at [facility], [place] [address], [place], [place] [address]   MRSA culture     Status: None   Collection Time: 02/22/18  7:32 AM  Result Value Ref Range Status   Specimen Description NASAL SWAB  Final   Special Requests NONE  Final   Culture   Final    NO MRSA DETECTED Performed at [facility], [address], [place], [place] [address]    Report Status 02/23/2018 FINAL  Final         Radiology Studies: Dg Chest Port 1 View  Result Date: 02/27/2018 CLINICAL DATA:  Encounter for ARDS. EXAM: PORTABLE CHEST 1 VIEW COMPARISON:  02/24/2018 FINDINGS: Normal heart size. There is no pleural effusion. Interval improvement in pulmonary edema pattern. No new findings. IMPRESSION: 1. Interval improvement in pulmonary edema. Electronically Signed   By: Kerby Moors M.D.   On: 02/27/2018 08:49        Scheduled Meds: . collagenase   Topical Daily  . FLUoxetine  20 mg Oral Daily  . folic acid  1 mg Oral Daily  . furosemide  40 mg Intravenous Q12H  . heparin  5,000 Units Subcutaneous Q8H  . insulin aspart  0-9 Units Subcutaneous TID WC  . ipratropium-albuterol  3 mL Nebulization TID  . mouth rinse  15 mL Mouth Rinse BID  . methylPREDNISolone (SOLU-MEDROL) injection  40 mg Intravenous Q12H  . nicotine  21 mg Transdermal Daily  . thiamine  100 mg Oral Daily   Continuous Infusions: . sodium chloride    . piperacillin-tazobactam (ZOSYN)  IV       LOS: 5 days    Time spent: 35 minutes.     Elmarie Shiley, MD [facility] Pager [phone]  If 7PM-7AM, please contact night-coverage [email or web address] Password [facility] 02/27/2018, 12:17 PM

## 2018-02-27 NOTE — Progress Notes (Signed)
ANTIBIOTIC CONSULT NOTE - INITIAL  Pharmacy Consult for zosyn Indication: aspiration PNA  Allergies  Allergen Reactions  . Nitrofurantoin Hives  . Other Other (See Comments)    Allergies mold, dust per allergy test  . Sulfonamide Derivatives Other (See Comments)    Unknown allergic reaction per husband     Patient Measurements: Height: 5\' 5"  (165.1 cm) Weight: 128 lb 4.9 oz (58.2 kg) IBW/kg (Calculated) : 57   Vital Signs: Temp: 97.9 F (36.6 C) (05/26 0828) Temp Source: Oral (05/26 0828) BP: 106/75 (05/26 0828) Pulse Rate: 86 (05/26 0828) Intake/Output from previous day: 05/25 0701 - 05/26 0700 In: 840 [P.O.:840] Out: 500 [Urine:500] Intake/Output from this shift: No intake/output data recorded.  Labs: Recent Labs    02/25/18 0327 02/26/18 0035 02/27/18 0250  WBC 20.3* 36.6* 35.2*  HGB 9.4* 9.4* 9.6*  PLT 353 401* 440*  CREATININE 0.64 0.69 0.70   Estimated Creatinine Clearance: 77.4 mL/min (by C-G formula based on SCr of 0.7 mg/dL). No results for input(s): VANCOTROUGH, VANCOPEAK, VANCORANDOM, GENTTROUGH, GENTPEAK, GENTRANDOM, TOBRATROUGH, TOBRAPEAK, TOBRARND, AMIKACINPEAK, AMIKACINTROU, AMIKACIN in the last 72 hours.   Microbiology: Recent Results (from the past 720 hour(s))  Culture, fungus without smear     Status: None   Collection Time: 01/31/18  4:47 PM  Result Value Ref Range Status   Specimen Description BRONCHIAL ALVEOLAR LAVAGE  Final   Special Requests LUNG LEFT  Final   Culture   Final    NO FUNGUS ISOLATED AFTER 21 DAYS Performed at [facility], [address], [place], [place] [address]    Report Status 02/21/2018 FINAL  Final  Acid Fast Smear (AFB)     Status: None   Collection Time: 01/31/18  4:47 PM  Result Value Ref Range Status   AFB Specimen Processing Concentration  Final   Acid Fast Smear Negative  Final    Comment: (NOTE) Performed At: [facility] [place], [place] [id number] Rush Farmer MD  AO:[id number]    Source (AFB) BRONCHIAL ALVEOLAR LAVAGE  Final    Comment: LUNG LEFT   Culture, respiratory (NON-Expectorated)     Status: None   Collection Time: 01/31/18  4:47 PM  Result Value Ref Range Status   Specimen Description BRONCHIAL ALVEOLAR LAVAGE  Final   Special Requests LUNG LEFT  Final   Gram Stain   Final    FEW WBC PRESENT, PREDOMINANTLY PMN NO ORGANISMS SEEN    Culture   Final    NO GROWTH 2 DAYS Performed at [facility], [address], [place], [place] [address]    Report Status 02/03/2018 FINAL  Final  Urine Culture     Status: Abnormal   Collection Time: 02/15/18  7:25 AM  Result Value Ref Range Status   Specimen Description URINE, RANDOM  Final   Special Requests   Final    NONE Performed at [facility], [place] [address] [place], [place] [address]    Culture 80,000 COLONIES/mL YEAST (A)  Final   Report Status 02/16/2018 FINAL  Final  Culture, blood (Routine x 2)     Status: None   Collection Time: 02/22/18  1:56 AM  Result Value Ref Range Status   Specimen Description BLOOD LEFT HAND  Final   Special Requests   Final    BOTTLES DRAWN AEROBIC AND ANAEROBIC Blood Culture results may not be optimal due to an inadequate volume of blood received in culture bottles   Culture  Final    NO GROWTH 5 DAYS Performed at [facility], [place] [address], [place], [place] [address]    Report Status 02/27/2018 FINAL  Final  Culture, blood (Routine x 2)     Status: None   Collection Time: 02/22/18  1:56 AM  Result Value Ref Range Status   Specimen Description BLOOD LEFT FOREARM  Final   Special Requests   Final    BOTTLES DRAWN AEROBIC AND ANAEROBIC Blood Culture adequate volume   Culture   Final    NO GROWTH 5 DAYS Performed at [facility], [place] [address], [place], [place] [address]    Report Status 02/27/2018 FINAL  Final  Urine culture     Status: None   Collection Time: 02/22/18  3:00 AM  Result Value Ref Range Status    Specimen Description URINE, RANDOM  Final   Special Requests NONE  Final   Culture   Final    NO GROWTH Performed at [facility], [place] [address] [place], [place] [address]    Report Status 02/23/2018 FINAL  Final  Respiratory Panel by PCR     Status: None   Collection Time: 02/22/18  6:32 AM  Result Value Ref Range Status   Adenovirus NOT DETECTED NOT DETECTED Final   Coronavirus 229E NOT DETECTED NOT DETECTED Final   Coronavirus HKU1 NOT DETECTED NOT DETECTED Final   Coronavirus NL63 NOT DETECTED NOT DETECTED Final   Coronavirus OC43 NOT DETECTED NOT DETECTED Final   Metapneumovirus NOT DETECTED NOT DETECTED Final   Rhinovirus / Enterovirus NOT DETECTED NOT DETECTED Final   Influenza A NOT DETECTED NOT DETECTED Final   Influenza B NOT DETECTED NOT DETECTED Final   Parainfluenza Virus 1 NOT DETECTED NOT DETECTED Final   Parainfluenza Virus 2 NOT DETECTED NOT DETECTED Final   Parainfluenza Virus 3 NOT DETECTED NOT DETECTED Final   Parainfluenza Virus 4 NOT DETECTED NOT DETECTED Final   Respiratory Syncytial Virus NOT DETECTED NOT DETECTED Final   Bordetella pertussis NOT DETECTED NOT DETECTED Final   Chlamydophila pneumoniae NOT DETECTED NOT DETECTED Final   Mycoplasma pneumoniae NOT DETECTED NOT DETECTED Final    Comment: Performed at [facility], [place] [address], [place], [place] [address]  MRSA PCR Screening     Status: Abnormal   Collection Time: 02/22/18  6:32 AM  Result Value Ref Range Status   MRSA by PCR INVALID RESULTS, SPECIMEN SENT FOR CULTURE (A) NEGATIVE Final    Comment:        The GeneXpert MRSA Assay (FDA approved for NASAL specimens only), is one component of a comprehensive MRSA colonization surveillance program. It is not intended to diagnose MRSA infection nor to guide or monitor treatment for MRSA infections. Performed at [facility], [place] [address] [place], [place] [address]   MRSA culture     Status: None   Collection Time:  02/22/18  7:32 AM  Result Value Ref Range Status   Specimen Description NASAL SWAB  Final   Special Requests NONE  Final   Culture   Final    NO MRSA DETECTED Performed at [facility], [address], [place], [place] [address]    Report Status 02/23/2018 FINAL  Final    Medical History: Past Medical History:  Diagnosis Date  . Alcohol abuse   . Anemia   . Anxiety   . Anxiety and depression   . Asthma   . Depression   .  Hepatitis A    "when I was a kid"  . Hypertension      Assessment: 47 yo female with aspiration PNA on Augmentin since 5/23. Pharmacy has been asked to dose zosyn for aspiration PNA.   Plan:  Zosyn 3.375 gm IV q8h (Extended infusion) Monitor for CBC, clinical course, fever curve and de-escalation plan.   Sheriece Jefcoat A. Levada Dy, PharmD, [place] Pager: [phone]  02/27/2018,11:20 AM

## 2018-02-28 LAB — BASIC METABOLIC PANEL
Anion gap: 11 (ref 5–15)
BUN: 28 mg/dL — ABNORMAL HIGH (ref 6–20)
CO2: 30 mmol/L (ref 22–32)
Calcium: 9.1 mg/dL (ref 8.9–10.3)
Chloride: 97 mmol/L — ABNORMAL LOW (ref 101–111)
Creatinine, Ser: 0.86 mg/dL (ref 0.44–1.00)
GFR calc Af Amer: >60 mL/min (ref >60)
GFR calc non Af Amer: >60 mL/min (ref >60)
Glucose, Bld: 163 mg/dL — ABNORMAL HIGH (ref 65–99)
Potassium: 4.2 mmol/L (ref 3.5–5.1)
Sodium: 138 mmol/L (ref 135–145)

## 2018-02-28 LAB — CBC
HCT: 30.5 % — ABNORMAL LOW (ref 36.0–46.0)
Hemoglobin: 9.9 g/dL — ABNORMAL LOW (ref 12.0–15.0)
MCH: 33.2 pg (ref 26.0–34.0)
MCHC: 32.5 g/dL (ref 30.0–36.0)
MCV: 102.3 fL — ABNORMAL HIGH (ref 78.0–100.0)
Platelets: 485 10*3/uL — ABNORMAL HIGH (ref 150–400)
RBC: 2.98 MIL/uL — ABNORMAL LOW (ref 3.87–5.11)
RDW: 13.5 % (ref 11.5–15.5)
WBC: 25 10*3/uL — ABNORMAL HIGH (ref 4.0–10.5)

## 2018-02-28 LAB — MAGNESIUM: Magnesium: 2 mg/dL (ref 1.7–2.4)

## 2018-02-28 LAB — GLUCOSE, CAPILLARY
Glucose-Capillary: 115 mg/dL — ABNORMAL HIGH (ref 65–99)
Glucose-Capillary: 105 mg/dL — ABNORMAL HIGH (ref 65–99)
Glucose-Capillary: 173 mg/dL — ABNORMAL HIGH (ref 65–99)
Glucose-Capillary: 188 mg/dL — ABNORMAL HIGH (ref 65–99)

## 2018-02-28 MED ORDER — PREDNISONE 20 MG PO TABS
40.0000 mg | ORAL_TABLET | Freq: Every day | ORAL | Status: DC
  Administered 2018-02-28 – 2018-03-01 (×2): 40 mg via ORAL
  Filled 2018-02-28 (×3): qty 2

## 2018-02-28 NOTE — Progress Notes (Signed)
PROGRESS NOTE    Natalie Morrison  BLT:[id number] DOB: 1970/11/16 DOA: 02/22/2018 PCP: Eulas Post, MD   Brief Narrative: 47 year old WF PMHx EtOH abuse, Anxiety, Depression, Asthma, Hepatitis A, HTN  Presenting with shortness of breath and fever.  Recently hospitalized from 4/25 to 0/07, course complicated by pneumonia, ARDS, UTI, and EtOH withdrawal. She was reintubated several times for self extubation and stridor. Her blood cultures and cultures from BAL on 4/29 were negative. She was discharged home on room air with cipro and diflucan.   She reports she was feeling well except using her inhaler more since 5/18. She reported fever, chills, nonproductive cough, congestion, ear pain, and increasing shortness of breath since yesterday. Additionally, she denies smoking, but states she has had a few alcoholic drinks at home, including yesterday, because she thought she could possibly be withdrawing.   In the ER, she was febrile, 103.8, borderline hypotensive, tachycardic, tachypneic ranging from 30-40's, and initially hypoxic on room air at 87% requiring ventri mask. Labs noted for WBC 42.0 with left shift, Hgb 10.9, K 2.9, CO2 14, glucose 159, AG 17, albumin 2.9, protein 6.4, neg troponin x 2, lactic 4.01, which has improved to 1.08 after IVF. UA neg, and CXR appears slightly improved from prior, but still noted for persistent left mid and lower opacities. Currently patient remains borderline hypotensive despite almost 4L NS and remains significantly tachypneic. Blood and urine cultures sent and started on vancomycin and zosyn. PCCM called by [facility] for possible admission versus consult; will admit to ICU.    Assessment & Plan:   Active Problems:   Acute respiratory failure with hypoxia (HCC)   Sepsis (HCC)   Pressure injury of skin  Acute respiratory failure with hypoxia/ARDS/HCAP Multifactorial related to Aspiration PNA, in setting of alcohol intoxication, vs  pneumonitis.  On IV lasix. Weight 134  pounds. Prior weight 120.  Blood culture; no growth to date.  Received 2 days of Primaxin and vancomycin and  3 of Augmentin.  WBC still in the 30 K range.  change oral antibiotics to IV zosyn, 5-26 Continue with IV lasix. Continue to taper Solumedrol/  IV zosyn.   Acute on chronic diastolic HF;  Continue with  IV lasix.  She was not using lasix PRN at home.  Weight 128 pounds. Weight on last discharge from hospital was 120/.   Lactic acidosis; resolved.  Dysphasia -5/23 Modified Barium Swallow; cleared for regular diet.  Anemia;  Iron; 52, ferritin; 429, B12. 998 Hb stable at 9.    Hyperglycemia from steroids. Taper steroids.  HB A1c at 5.7  Alcohol abuse ;  Counseling provide.  SW consulted, to see patient on 5-26 patient preference.    Anxiety/Depression -Start Prozac 20 mg daily  -Currently on Ativan PRN      DVT prophylaxis: Heparin  Code Status: full code.  Family Communication: care discussed with patient  Disposition Plan: home when respiratory status improved.    Consultants:   none   Procedures:   Procedures/Significant Events:  5/21 Echocardiogram;Left ventricle: LVEF= 65% to 70%.--(grade 1 diastolic dysfunction). 5/21 CTA chest PE protocol: PE-Moderate symmetric patchy ground-glass opacities in a slight upper lobe and anterior prominent distribution with mild septal: sequela of recent ARDS is favored.-  Mild bilateral hilar adenopathy is likely reactive. 5/21 CXR  > b/l opacities. 5/21 Abd US> no acute process.  Autoimmune labs: Ds-DNA neg, anti jo neg, anti Scl neg, RF neg, ANCA neg, ANA neg, HSP panel pending  Antimicrobials:   Augmentin    Subjective: She is breathing better, denies abdominal pain or chest pain    Objective: Vitals:   02/27/18 0828 02/27/18 1708 02/28/18 0012 02/28/18 0300  BP: 106/75 107/74 (!) 145/96   Pulse: 86 86 (!) 51   Resp: 18 18    Temp: 97.9 F (36.6  C) 98 F (36.7 C) 97.7 F (36.5 C)   TempSrc: Oral Oral Oral   SpO2: 97% 97% 97%   Weight:    60.8 kg (134 lb 0.6 oz)  Height:        Intake/Output Summary (Last 24 hours) at 02/28/2018 0818 Last data filed at 02/28/2018 0230 Gross per 24 hour  Intake 340 ml  Output -  Net 340 ml   Filed Weights   02/26/18 0428 02/27/18 0500 02/28/18 0300  Weight: 59.4 kg (130 lb 15.3 oz) 58.2 kg (128 lb 4.9 oz) 60.8 kg (134 lb 0.6 oz)    Examination:  General exam: NAD Respiratory system: CTA Cardiovascular system: S 1, S 2 RRR Gastrointestinal system: BS present, soft, nt Central nervous system; Non focal.  Extremities: Symmetric power.  Skin: No rashes.     Data Reviewed: I have personally reviewed following labs and imaging studies  CBC: Recent Labs  Lab 02/22/18 0138 02/22/18 0452 02/25/18 0327 02/26/18 0035 02/27/18 0250 02/28/18 0330  WBC 42.1* 37.2* 20.3* 36.6* 35.2* 25.0*  NEUTROABS 37.1*  --   --   --   --   --   HGB 10.9* 9.3* 9.4* 9.4* 9.6* 9.9*  HCT 32.4* 28.6* 28.6* 28.7* 29.2* 30.5*  MCV 99.7 103.2* 101.8* 101.4* 102.5* 102.3*  PLT 444* 272 353 401* 440* 196*   Basic Metabolic Panel: Recent Labs  Lab 02/22/18 0452  02/24/18 0306 02/24/18 1541 02/25/18 0327 02/26/18 0035 02/27/18 0250 02/28/18 0330  NA 138   < > 138 135 135 137 134* 138  K 2.8*   < > 3.3* 4.1 4.3 4.3 4.1 4.2  CL 110   < > 104 97* 97* 98* 96* 97*  CO2 18*   < > _0 GLUCOSE 107*   < > 113* 273* 296* 271* 158* 163*  BUN 5*   < > _1 26* 28*  CREATININE 0.62   < > 0.52 0.54 0.64 0.69 0.70 0.86  CALCIUM 7.5*   < > 8.5* 8.6* 8.8* 9.1 8.8* 9.1  MG 1.1*   < > 1.4*  --  1.9 1.7 1.9 2.0  PHOS 3.5  --   --   --  4.2  --   --   --    < > = values in this interval not displayed.   GFR: Estimated Creatinine Clearance: 72 mL/min (by C-G formula based on SCr of 0.86 mg/dL). Liver Function Tests: Recent Labs  Lab 02/22/18 0138  AST 16  ALT 14  ALKPHOS 100  BILITOT 0.7    PROT 6.4*  ALBUMIN 2.9*   Recent Labs  Lab 02/22/18 0452  LIPASE 23   No results for input(s): AMMONIA in the last 168 hours. Coagulation Profile: Recent Labs  Lab 02/22/18 0138  INR 1.16   Cardiac Enzymes: Recent Labs  Lab 02/22/18 0139 02/22/18 1352  CKTOTAL  --  10*  TROPONINI <0.03  --    BNP (last 3 results) No results for input(s): PROBNP in the last 8760 hours. HbA1C: Recent Labs    02/25/18 1726  HGBA1C 5.7*   CBG:  Recent Labs  Lab 02/27/18 0755 02/27/18 1232 02/27/18 1734 02/27/18 2014 02/28/18 0809  GLUCAP 141* 141* 233* 170* 115*   Lipid Profile: Recent Labs    02/25/18 1727  CHOL 169  HDL 25*  LDLCALC 104*  TRIG 202*  CHOLHDL 6.8   Thyroid Function Tests: No results for input(s): TSH, T4TOTAL, FREET4, T3FREE, THYROIDAB in the last 72 hours. Anemia Panel: Recent Labs    02/25/18 0826  VITAMINB12 998*  FOLATE 14.1  FERRITIN 429*  TIBC 232*  IRON 52  RETICCTPCT 3.8*   Sepsis Labs: Recent Labs  Lab 02/22/18 0154 02/22/18 0346 02/22/18 0420 02/22/18 0452 02/23/18 0812 02/24/18 0306  PROCALCITON  --   --   --  0.57 0.56 0.37  LATICACIDVEN 4.01* 1.08 1.2 1.1  --   --     Recent Results (from the past 240 hour(s))  Culture, blood (Routine x 2)     Status: None   Collection Time: 02/22/18  1:56 AM  Result Value Ref Range Status   Specimen Description BLOOD LEFT HAND  Final   Special Requests   Final    BOTTLES DRAWN AEROBIC AND ANAEROBIC Blood Culture results may not be optimal due to an inadequate volume of blood received in culture bottles   Culture   Final    NO GROWTH 5 DAYS Performed at [facility], [place] [address] [place], [place] [address]    Report Status 02/27/2018 FINAL  Final  Culture, blood (Routine x 2)     Status: None   Collection Time: 02/22/18  1:56 AM  Result Value Ref Range Status   Specimen Description BLOOD LEFT FOREARM  Final   Special Requests   Final    BOTTLES DRAWN AEROBIC AND ANAEROBIC  Blood Culture adequate volume   Culture   Final    NO GROWTH 5 DAYS Performed at [facility], [place] [address], [place], [place] [address]    Report Status 02/27/2018 FINAL  Final  Urine culture     Status: None   Collection Time: 02/22/18  3:00 AM  Result Value Ref Range Status   Specimen Description URINE, RANDOM  Final   Special Requests NONE  Final   Culture   Final    NO GROWTH Performed at [facility], [place] [address], [place], [place] [address]    Report Status 02/23/2018 FINAL  Final  Respiratory Panel by PCR     Status: None   Collection Time: 02/22/18  6:32 AM  Result Value Ref Range Status   Adenovirus NOT DETECTED NOT DETECTED Final   Coronavirus 229E NOT DETECTED NOT DETECTED Final   Coronavirus HKU1 NOT DETECTED NOT DETECTED Final   Coronavirus NL63 NOT DETECTED NOT DETECTED Final   Coronavirus OC43 NOT DETECTED NOT DETECTED Final   Metapneumovirus NOT DETECTED NOT DETECTED Final   Rhinovirus / Enterovirus NOT DETECTED NOT DETECTED Final   Influenza A NOT DETECTED NOT DETECTED Final   Influenza B NOT DETECTED NOT DETECTED Final   Parainfluenza Virus 1 NOT DETECTED NOT DETECTED Final   Parainfluenza Virus 2 NOT DETECTED NOT DETECTED Final   Parainfluenza Virus 3 NOT DETECTED NOT DETECTED Final   Parainfluenza Virus 4 NOT DETECTED NOT DETECTED Final   Respiratory Syncytial Virus NOT DETECTED NOT DETECTED Final   Bordetella pertussis NOT DETECTED NOT DETECTED Final   Chlamydophila pneumoniae NOT DETECTED NOT DETECTED Final   Mycoplasma pneumoniae NOT DETECTED NOT DETECTED Final    Comment: Performed  at [facility], [place] [address], [place], [place] [address]  MRSA PCR Screening     Status: Abnormal   Collection Time: 02/22/18  6:32 AM  Result Value Ref Range Status   MRSA by PCR INVALID RESULTS, SPECIMEN SENT FOR CULTURE (A) NEGATIVE Final    Comment:        The GeneXpert MRSA Assay (FDA approved for NASAL specimens only), is one component of  a comprehensive MRSA colonization surveillance program. It is not intended to diagnose MRSA infection nor to guide or monitor treatment for MRSA infections. Performed at [facility], [place] [address], [place], [place] [address]   MRSA culture     Status: None   Collection Time: 02/22/18  7:32 AM  Result Value Ref Range Status   Specimen Description NASAL SWAB  Final   Special Requests NONE  Final   Culture   Final    NO MRSA DETECTED Performed at [facility], [address], [place],  [address]    Report Status 02/23/2018 FINAL  Final         Radiology Studies: Dg Chest Port 1 View  Result Date: 02/27/2018 CLINICAL DATA:  Encounter for ARDS. EXAM: PORTABLE CHEST 1 VIEW COMPARISON:  02/24/2018 FINDINGS: Normal heart size. There is no pleural effusion. Interval improvement in pulmonary edema pattern. No new findings. IMPRESSION: 1. Interval improvement in pulmonary edema. Electronically Signed   By: Kerby Moors M.D.   On: 02/27/2018 08:49        Scheduled Meds: . collagenase   Topical Daily  . FLUoxetine  20 mg Oral Daily  . folic acid  1 mg Oral Daily  . furosemide  40 mg Intravenous Q12H  . heparin  5,000 Units Subcutaneous Q8H  . insulin aspart  0-9 Units Subcutaneous TID WC  . mouth rinse  15 mL Mouth Rinse BID  . nicotine  21 mg Transdermal Daily  . predniSONE  40 mg Oral Q breakfast  . thiamine  100 mg Oral Daily   Continuous Infusions: . sodium chloride    . piperacillin-tazobactam (ZOSYN)  IV 3.375 g (02/28/18 0551)     LOS: 6 days    Time spent: 35 minutes.     Elmarie Shiley, MD [facility] Pager [phone]  If 7PM-7AM, please contact night-coverage [email or web address] Password [facility] 02/28/2018, 8:18 AM

## 2018-02-28 NOTE — Clinical Social Work Note (Signed)
Clinical Social Work Assessment  Patient Details  Name: Natalie Morrison MRN: [id number] Date of Birth: August 11, 1971  Date of referral:  02/28/18               Reason for consult:  Substance Use/ETOH Abuse                Permission sought to share information with:  Family Supports Permission granted to share information::  Yes, Verbal Permission Granted  Name::     Cambrea Kirt  Agency::     Relationship::  spouse  Contact Information:  [phone]  Housing/Transportation Living arrangements for the past 2 months:  [place] of Information:  Patient Patient Interpreter Needed:  None Criminal Activity/Legal Involvement Pertinent to Current Situation/Hospitalization:  No - Comment as needed Significant Relationships:  Spouse Lives with:  Spouse Do you feel safe going back to the place where you live?  Yes Need for family participation in patient care:  No (Coment)  Care giving concerns: Patient from home with spouse. PT recommending home health. CSW consulted for substance use.   Social Worker assessment / plan: CSW met with patient and spouse at bedside. CSW requested to speak to patient privately; patient declined to talk about alcohol use, stating she was annoyed right now because "I've been here a month." (CSW noted recent admission 4/25 - 02/16/18 and current admission from 5/21.)   CSW offered inpatient and outpatient resources and provided patient with list. Patient accepted list and thanked Education officer, museum. Provided patient with CSW contact if any additional questions about the resources provided. Signing off as no additional needs identified.  Employment status:    Insurance information:  Managed Care(BCBS) PT Recommendations:  Home with [place] / Referral to community resources:  Outpatient Substance Abuse Treatment Options, Residential Substance Abuse Treatment Options  Patient/Family's Response to care: Patient appreciative of  resources.  Patient/Family's Understanding of and Emotional Response to Diagnosis, Current Treatment, and Prognosis: Not discussed. Patient expressed annoyance with being admitted and did not wish to discuss conditions or substance use.  Emotional Assessment Appearance:  Appears stated age Attitude/Demeanor/Rapport:  Engaged Affect (typically observed):  Irritable Orientation:  Oriented to Self, Oriented to Place, Oriented to  Time, Oriented to Situation Alcohol / Substance use:  Not Applicable Psych involvement (Current and /or in the community):  No (Comment)  Discharge Needs  Concerns to be addressed:  Substance Abuse Concerns Readmission within the last 30 days:  Yes Current discharge risk:  Substance Abuse, Physical Impairment Barriers to Discharge:  Continued Medical Work up   Estanislado Emms, LCSW 02/28/2018, 2:05 PM

## 2018-02-28 NOTE — Plan of Care (Signed)
Pt ambulating in room and hallway without difficulty, appetite good with 100% of meal intake for breakfast and lunch.  Sacral wound dressing changed, wound care completed.  No signs of infection noted.

## 2018-03-01 LAB — BASIC METABOLIC PANEL
Anion gap: 13 (ref 5–15)
BUN: 26 mg/dL — ABNORMAL HIGH (ref 6–20)
CO2: 28 mmol/L (ref 22–32)
Calcium: 9.1 mg/dL (ref 8.9–10.3)
Chloride: 95 mmol/L — ABNORMAL LOW (ref 101–111)
Creatinine, Ser: 0.85 mg/dL (ref 0.44–1.00)
GFR calc Af Amer: >60 mL/min (ref >60)
GFR calc non Af Amer: >60 mL/min (ref >60)
Glucose, Bld: 150 mg/dL — ABNORMAL HIGH (ref 65–99)
Potassium: 3.2 mmol/L — ABNORMAL LOW (ref 3.5–5.1)
Sodium: 136 mmol/L (ref 135–145)

## 2018-03-01 LAB — GLUCOSE, CAPILLARY: Glucose-Capillary: 83 mg/dL (ref 65–99)

## 2018-03-01 LAB — CBC
HCT: 32.4 % — ABNORMAL LOW (ref 36.0–46.0)
Hemoglobin: 10.5 g/dL — ABNORMAL LOW (ref 12.0–15.0)
MCH: 33 pg (ref 26.0–34.0)
MCHC: 32.4 g/dL (ref 30.0–36.0)
MCV: 101.9 fL — ABNORMAL HIGH (ref 78.0–100.0)
Platelets: 462 10*3/uL — ABNORMAL HIGH (ref 150–400)
RBC: 3.18 MIL/uL — ABNORMAL LOW (ref 3.87–5.11)
RDW: 13.6 % (ref 11.5–15.5)
WBC: 19.7 10*3/uL — ABNORMAL HIGH (ref 4.0–10.5)

## 2018-03-01 LAB — MAGNESIUM: Magnesium: 1.9 mg/dL (ref 1.7–2.4)

## 2018-03-01 MED ORDER — POTASSIUM CHLORIDE CRYS ER 20 MEQ PO TBCR
40.0000 meq | EXTENDED_RELEASE_TABLET | Freq: Once | ORAL | Status: AC
  Administered 2018-03-01: 40 meq via ORAL
  Filled 2018-03-01: qty 2

## 2018-03-01 MED ORDER — IPRATROPIUM-ALBUTEROL 0.5-2.5 (3) MG/3ML IN SOLN: 3.0000 mL | Freq: Four times a day (QID) | RESPIRATORY_TRACT | 0 refills | Status: DC | PRN

## 2018-03-01 MED ORDER — LEVOFLOXACIN 750 MG PO TABS: 750.0000 mg | ORAL_TABLET | Freq: Every day | ORAL | 0 refills | Status: DC

## 2018-03-01 MED ORDER — NICOTINE 21 MG/24HR TD PT24: 21.0000 mg | MEDICATED_PATCH | Freq: Every day | TRANSDERMAL | 0 refills | Status: DC

## 2018-03-01 MED ORDER — PREDNISONE 20 MG PO TABS: ORAL_TABLET | ORAL | 0 refills | Status: DC

## 2018-03-01 NOTE — Discharge Summary (Addendum)
Physician Discharge Summary  Natalie Morrison WPY:[id number] DOB: [birth year] DOA: 02/22/2018  PCP: Eulas Post, MD  Admit date: 02/22/2018 Discharge date: 03/01/2018  Admitted From: Home  Disposition:  Home   Recommendations for Outpatient Follow-up:  1. Follow up with PCP in 1-2 weeks 2. Please obtain BMP/CBC in one week 3. Continue counseling regarding alcohol cessation.   Home Health: yes   Discharge Condition: Stable,  CODE STATUS: Full code.  Diet recommendation: Heart Healthy   Brief/Interim Summary:  Brief Narrative: 47 year oldWFPMHxEtOH abuse,Anxiety,Depression,Asthma,Hepatitis A,HTN  Presenting with shortness of breath and fever.  Recently hospitalized from 4/25 to 0/53, course complicated by pneumonia, ARDS, UTI, andEtOH withdrawal. She was reintubated several times for self extubation and stridor. Her blood cultures and cultures from BAL on 4/29 were negative. She was discharged home on room air with cipro and diflucan.   She reports she was feeling well except using her inhaler more since 5/18. She reported fever, chills, nonproductive cough, congestion, ear pain, and increasing shortness of breath since yesterday. Additionally, she denies smoking, but states she has had a few alcoholic drinks at home, including yesterday, because she thought she could possibly be withdrawing.   In the ER, she was febrile, 103.8, borderline hypotensive, tachycardic, tachypneic ranging from 30-40's, and initially hypoxic on room air at 87% requiring ventri mask. Labs noted for WBC 42.0 with left shift, Hgb 10.9, K 2.9, CO2 14, glucose 159, AG 17, albumin 2.9, protein 6.4, neg troponin x 2, lactic 4.01, which has improved to 1.08 after IVF. UA neg, and CXR appears slightly improved from prior, but still noted for persistent left mid and lower opacities. Currently patient remains borderline hypotensive despite almost 4L NS and remains significantly tachypneic. Blood  and urine cultures sent and started on vancomycin and zosyn. PCCM called by [facility] for possible admission versus consult; will admit to ICU.   Assessment & Plan:   Active Problems:   Acute respiratory failure with hypoxia (HCC)   Sepsis (HCC)   Pressure injury of skin  Acute respiratory failure with hypoxia/ARDS/HCAP Multifactorial related to Aspiration PNA, in setting of alcohol intoxication, vs pneumonitis.  On IV lasix. Weight 134  pounds. Prior weight 120. Weight day of discharge 124.  Blood culture; no growth to date.  Received 2 days of Primaxin and vancomycin and  3 of Augmentin.  WBC still in the 30 K range.  change oral antibiotics to IV zosyn, 5-26.  Treated  with IV lasix and  taper of Solumedrol/  Received 2 days of IV zosyn, WBC decreased. She will be discharge on Levaquin and prednisone taper. Resume her home dose PRN lasix.   Sepsis; ruled in.  Treated wit Antibiotics.   Acute on chronic diastolic HF;  Continue with  IV lasix.  She was not using lasix PRN at home.  Weight 128 pounds. Weight on last discharge from hospital was 120/.   Lactic acidosis; resolved.  Dysphasia -5/23ModifiedBariumSwallow;cleared for regular diet.  Anemia;  Iron; 52, ferritin; 429, B12. 998 Hb stable at 9.    Hyperglycemia from steroids. Taper steroids.  HB A1c at 5.7  Alcohol abuse ;  Counseling provide. Explain the importance of alcohol cessation, to avoid episodes of AMS and aspiration. Patient relates she will reviewed material provided by SW.  SW consulted.    Anxiety/Depression -Start Prozac 20 mg daily   Wound , unstageable, at gluteal fold. Related to friction. Local care  Discharge Diagnoses:  Active Problems:   Acute respiratory failure  with hypoxia (HCC)   Sepsis ([place])   Alcohol abuse   Acute on chronic Diastolic dysfunction.    Anemia   Discharge Instructions  Discharge Instructions    Diet - low sodium heart healthy   Complete by:  As  directed    Increase activity slowly   Complete by:  As directed      Allergies as of 03/01/2018      Reactions   Nitrofurantoin Hives   Other Other (See Comments)   Allergies mold, dust per allergy test   Sulfonamide Derivatives Other (See Comments)   Unknown allergic reaction per husband       Medication List    STOP taking these medications   fluconazole 200 MG tablet Commonly known as:  DIFLUCAN   metoprolol tartrate 25 MG tablet Commonly known as:  LOPRESSOR     TAKE these medications   albuterol 90 MCG/ACT inhaler Commonly known as:  PROVENTIL,VENTOLIN Inhale 2 puffs into the lungs every 6 (six) hours as needed for wheezing or shortness of breath. Reported on 01/02/2016   cetirizine 10 MG chewable tablet Commonly known as:  ZYRTEC Chew 10 mg by mouth at bedtime.   cyanocobalamin 1000 MCG tablet Take 1 tablet (1,000 mcg total) by mouth daily.   fluticasone 50 MCG/ACT nasal spray Commonly known as:  FLONASE Place 1 spray into both nostrils daily.   folic acid 1 MG tablet Commonly known as:  FOLVITE Take 1 tablet (1 mg total) by mouth daily.   furosemide 20 MG tablet Commonly known as:  LASIX TAKE 1 TABLET BY MOUTH AS NEEDED FOR SWELLING.   ipratropium-albuterol 0.5-2.5 (3) MG/3ML Soln Commonly known as:  DUONEB Take 3 mLs by nebulization every 6 (six) hours as needed.   levofloxacin 750 MG tablet Commonly known as:  LEVAQUIN Take 1 tablet (750 mg total) by mouth daily for 7 days.   multivitamin with minerals Tabs tablet Take 1 tablet by mouth daily. One a Day Women's   nicotine 21 mg/24hr patch Commonly known as:  NICODERM CQ - dosed in mg/24 hours Place 1 patch (21 mg total) onto the skin daily.   predniSONE 20 MG tablet Commonly known as:  DELTASONE Take 40 mg for 3 days then 20 mg for 3 days then stop.      Follow-up Information    [facility] Follow [facility].   Specialty:  [place] Why:  Resume HHRN, HHPT,  HHOT Contact information: [place] [address] [phone]          Allergies  Allergen Reactions  . Nitrofurantoin Hives  . Other Other (See Comments)    Allergies mold, dust per allergy test  . Sulfonamide Derivatives Other (See Comments)    Unknown allergic reaction per husband     Consultations:  CCM   Procedures/Studies: Dg Abd 1 View  Result Date: 02/09/2018 INDICATION: Need for feeding catheter EXAM: ABDOMEN - 1 VIEW COMPARISON:  02/02/2018 CONTRAST:  15 mL Isovue FLUOROSCOPY TIME:  2 minutes. 54 seconds. COMPLICATIONS: None immediate PROCEDURE: The Dobbhoff tube was lubricated with viscous lidocaine inserted into the right nostril. Under intermittent fluoroscopic guidance, the Dobbhoff tube was advanced through the stomach, through the duodenum with tip ultimately terminating over the expected location of the second portion of the duodenum. Contrast material was injected to confirm positioning a spot fluoroscopic image was saved for documentation purposes. The tube was affixed to the patient's nose with tape. The patient tolerated the procedure  well without immediate postprocedural complication. FINDINGS: Placement of a feeding catheter into the second portion of the duodenum. IMPRESSION: Successful fluoroscopic guided placement of Dobbhoff tube with tip terminating over the duodenojejunal junction. The tube is ready for immediate use. CLINICAL DATA:  Feeding catheter placement Electronically Signed   By: Inez Catalina M.D.   On: 02/09/2018 15:47   Ct Head Wo Contrast  Result Date: 02/22/2018 CLINICAL DATA:  Headache, acute, severe, worst HA of life EXAM: CT HEAD WITHOUT CONTRAST TECHNIQUE: Contiguous axial images were obtained from the base of the skull through the vertex without intravenous contrast. COMPARISON:  Head CT 02/13/2018 FINDINGS: Brain: Generalized atrophy, stable from prior exam but advanced for age. No intracranial hemorrhage, mass effect,  or midline shift. No hydrocephalus. The basilar cisterns are patent. No evidence of territorial infarct or acute ischemia. No extra-axial or intracranial fluid collection. Vascular: No hyperdense vessel or unexpected calcification. Skull: No fracture or focal lesion. Sinuses/Orbits: Decreasing opacification of lower mastoid air cells, residual in the left greater than right. No acute findings. Other: None. IMPRESSION: 1.  No acute intracranial abnormality. 2. Stable but advanced for age atrophy from recent prior exam. Electronically Signed   By: Jeb Levering M.D.   On: 02/22/2018 06:01   Ct Head Wo Contrast  Result Date: 02/13/2018 CLINICAL DATA:  47 year old female with abnormal speech. Recent ICU admission with ARDS, alcohol withdrawal. EXAM: CT HEAD WITHOUT CONTRAST TECHNIQUE: Contiguous axial images were obtained from the base of the skull through the vertex without intravenous contrast. COMPARISON:  Face CT 07/17/2013. FINDINGS: Brain: Advanced for age generalized cerebral volume loss. No focal encephalomalacia identified. No midline shift, ventriculomegaly, mass effect, evidence of mass lesion, intracranial hemorrhage or evidence of cortically based acute infarction. Gray-white matter differentiation is within normal limits throughout the brain. Vascular: Mild Calcified atherosclerosis at the skull base. The distal left vertebral artery appears dominant. Skull: Negative. Sinuses/Orbits: Partially visible left side nasoenteric tube. The visible paranasal sinuses are well pneumatized. Tympanic cavities are clear. There are mild bilateral mastoid effusions. Other: Visualized orbit soft tissues are within normal limits. Visualized scalp soft tissues are within normal limits. IMPRESSION: 1. No acute intracranial abnormality. Age advanced generalized cerebral volume loss, but otherwise negative noncontrast CT appearance of the brain. 2. Left side nasoenteric tube in place. Mild bilateral mastoid effusions  likely related to recent intubation. Electronically Signed   By: Genevie Ann M.D.   On: 02/13/2018 17:22   Ct Angio Chest Pe W Or Wo Contrast  Result Date: 02/22/2018 CLINICAL DATA:  Shortness of breath and chest pain. Recent hospital admission for pneumonia. EXAM: CT ANGIOGRAPHY CHEST WITH CONTRAST TECHNIQUE: Multidetector CT imaging of the chest was performed using the standard protocol during bolus administration of intravenous contrast. Multiplanar CT image reconstructions and MIPs were obtained to evaluate the vascular anatomy. CONTRAST:  156m ISOVUE-370 IOPAMIDOL (ISOVUE-370) INJECTION 76% COMPARISON:  Radiographs earlier this day. Prior chest radiographs over the past month. FINDINGS: Cardiovascular: There are no filling defects within the pulmonary arteries to suggest pulmonary embolus. Evaluation of the lung bases slightly limited due to breathing motion artifact. Normal caliber thoracic aorta without dissection. Heart is normal in size. No pericardial effusion. Mediastinum/Nodes: There prominent bilateral hilar lymph nodes measuring 11 mm on the right 14 mm on the left. Shotty mediastinal nodes. The esophagus is decompressed. Lungs/Pleura: Moderate symmetric patchy ground-glass opacities, slight upper lobe and anterior predominant with mild septal thickening. Small bilateral pleural effusions. Trachea and mainstem bronchi are patent. No  dominant pulmonary mass. Mild breathing motion artifact. Upper Abdomen: No acute abnormality. Musculoskeletal: There are no acute or suspicious osseous abnormalities. Review of the MIP images confirms the above findings. IMPRESSION: 1. No pulmonary embolus. 2. Moderate symmetric patchy ground-glass opacities in a slight upper lobe and anterior prominent distribution with mild septal thickening. Findings are nonspecific, however sequela of recent ARDS is favored. Pulmonary edema and infectious etiologies are also considered. 3. Mild bilateral hilar adenopathy is likely  reactive. Aortic Atherosclerosis (ICD10-I70.0). Electronically Signed   By: Jeb Levering M.D.   On: 02/22/2018 06:16   Dg Chest Port 1 View  Result Date: 02/27/2018 CLINICAL DATA:  Encounter for ARDS. EXAM: PORTABLE CHEST 1 VIEW COMPARISON:  02/24/2018 FINDINGS: Normal heart size. There is no pleural effusion. Interval improvement in pulmonary edema pattern. No new findings. IMPRESSION: 1. Interval improvement in pulmonary edema. Electronically Signed   By: Kerby Moors M.D.   On: 02/27/2018 08:49   Dg Chest Port 1 View  Result Date: 02/24/2018 CLINICAL DATA:  Respiratory distress. EXAM: PORTABLE CHEST 1 VIEW COMPARISON:  CT chest Feb 22, 2018 and chest radiograph Feb 22, 2018 FINDINGS: Similar to worsening interstitial prominence without pleural effusion or focal consolidation. Cardiac silhouette is upper limits of normal, mediastinal silhouette is nonsuspicious. No pneumothorax. Soft tissue planes and included osseous structures are normal. IMPRESSION: Similar to worsening interstitial changes seen with ARDS or infectious etiology. No focal consolidation. Electronically Signed   By: Elon Alas M.D.   On: 02/24/2018 01:16   Dg Chest Portable 1 View  Result Date: 02/22/2018 CLINICAL DATA:  Initial evaluation for acute shortness of breath, recent pneumonia. EXAM: PORTABLE CHEST 1 VIEW COMPARISON:  Prior radiograph from 02/08/2018 FINDINGS: Cardiac and mediastinal silhouettes are stable in size and contour, and remain within normal limits. A left PICC catheter has been removed since previous. Elevation of the left hemidiaphragm. Previously seen multifocal bilateral airspace opacities are improved within the right lung, but persist within the left mid and lower lung, relatively similar appearance. No pulmonary edema or pleural effusion. No pneumothorax. Osseous structures unchanged. Mild gaseous distension of the colon within the left upper quadrant noted. IMPRESSION: 1. Persistent patchy  opacity within the mid and lower lung, overall similar to most recent radiograph from 02/08/2018. While this finding may reflect sequelae of recently treated pneumonia, persistent and/or recurrent pneumonia could be considered in the correct clinical setting. 2. Additional previously seen bilateral airspace opacities otherwise improved from previous. Electronically Signed   By: Jeannine Boga M.D.   On: 02/22/2018 02:15   Dg Chest Port 1 View  Result Date: 02/08/2018 CLINICAL DATA:  Respiratory difficulty EXAM: PORTABLE CHEST 1 VIEW COMPARISON:  02/06/2018 FINDINGS: Cardiac shadow is stable. Left-sided PICC line is again noted in satisfactory position. The nasogastric catheter and endotracheal tube have been removed in the interval. Diffuse bilateral patchy opacities are noted throughout both lungs stable from the previous exam. The overall inspiratory effort is worse with some crowding of the lung opacities. IMPRESSION: Decreased respiratory effort when compared with the prior exam. Diffuse patchy opacities are again seen throughout both lungs. Electronically Signed   By: Inez Catalina M.D.   On: 02/08/2018 09:21   Dg Chest Port 1 View  Result Date: 02/06/2018 CLINICAL DATA:  Acute respiratory failure with hypoxemia EXAM: PORTABLE CHEST 1 VIEW COMPARISON:  Chest radiograph from one day prior. FINDINGS: Endotracheal tube tip is 3.8 cm above the carina. Enteric tube enters stomach with the tip not seen on this  image. Left PICC terminates over the right atrium. Stable cardiomediastinal silhouette with normal heart size. No pneumothorax. No pleural effusion. Stable severe patchy opacities throughout both lungs. IMPRESSION: 1. Well-positioned support structures. 2. Stable severe diffuse patchy lung opacities suggesting multilobar pneumonia and/or ARDS. Electronically Signed   By: Ilona Sorrel M.D.   On: 02/06/2018 08:25   Portable Chest X-ray  Result Date: 02/05/2018 CLINICAL DATA:  Intubated EXAM:  PORTABLE CHEST 1 VIEW COMPARISON:  Chest radiograph from earlier today. FINDINGS: Endotracheal tube tip is 3.3 cm above the carina. Enteric tube terminates in the distal stomach. Left PICC terminates at the cavoatrial junction. Stable cardiomediastinal silhouette with normal heart size. No pneumothorax. No pleural effusion. Stable severe patchy opacities throughout both lungs. IMPRESSION: 1. Well-positioned support structures as detailed. Enteric tube terminates in the distal stomach. 2. Stable severe patchy opacities throughout both lungs most suggestive of multilobar pneumonia. Electronically Signed   By: Ilona Sorrel M.D.   On: 02/05/2018 12:19   Dg Chest Port 1 View  Result Date: 02/05/2018 CLINICAL DATA:  47 year old female with history of acute respiratory failure. EXAM: PORTABLE CHEST 1 VIEW COMPARISON:  Chest x-ray 02/03/2018. FINDINGS: An endotracheal tube is in place with tip 2.9 cm above the carina. Nasogastric tube extends into the stomach. Lung volumes are low. Diffuse patchy interstitial and airspace disease scattered throughout the lungs bilaterally, with persistent similar poor aeration when compared to yesterday's examination. Probable small left pleural effusion. Pulmonary vasculature is obscured. Heart size appears grossly normal. Mediastinal contours are obscured. IMPRESSION: 1. Support apparatus, as above. 2. The appearance of the lungs is most suggestive of severe multilobar pneumonia, similar to the recent prior examination. Electronically Signed   By: Vinnie Langton M.D.   On: 02/05/2018 08:41   Dg Chest Port 1 View  Result Date: 02/03/2018 CLINICAL DATA:  Hypoxia EXAM: PORTABLE CHEST 1 VIEW COMPARISON:  Study obtained earlier in the day FINDINGS: Endotracheal tube tip is 2.8 cm above the carina. Central catheter tip is in the right atrium just beyond the cavoatrial junction. Nasogastric tube tip and side port are in the stomach. No pneumothorax. There is widespread interstitial and  alveolar opacity. Heart is upper normal in size with pulmonary vascularity within normal limits. No evident adenopathy. No bone lesions. IMPRESSION: Tube and catheter positions as described without pneumothorax. Widespread interstitial alveolar opacity bilaterally. Suspect a degree of ARDS. There may well be pulmonary edema and/or pneumonia superimposed as well. The overall appearance is stable compared to earlier in the day. Stable cardiac silhouette. Electronically Signed   By: Lowella Grip III M.D.   On: 02/03/2018 13:00   Dg Chest Port 1 View  Result Date: 02/03/2018 CLINICAL DATA:  Endotracheal and OG tubes. EXAM: PORTABLE CHEST 1 VIEW COMPARISON:  02/02/2018 FINDINGS: Endotracheal tube tip measures 2.4 cm above the carina. Enteric tube tip is off the field of view but below the left hemidiaphragm. Left central venous catheter tip over the right atrium. No pneumothorax. Shallow inspiration. Normal heart size. Diffuse bilateral airspace disease throughout the lungs. This could be due to edema, pneumonia, or ARDS. Small bilateral pleural effusions. IMPRESSION: Appliances appear in satisfactory position. Diffuse bilateral pulmonary infiltrates without significant change. Electronically Signed   By: Lucienne Capers M.D.   On: 02/03/2018 01:01   Dg Chest Port 1 View  Result Date: 02/02/2018 CLINICAL DATA:  Respiratory difficulty EXAM: PORTABLE CHEST 1 VIEW COMPARISON:  02/01/2018 FINDINGS: Endotracheal tube has been retracted. The tip is now 2.4 cm from  the carina. Left subclavian central venous catheter stable. Upper normal heart size. Diffuse airspace opacities are stable. No pneumothorax. IMPRESSION: Stable diffuse airspace opacities. Endotracheal tube tip is now 2.4 cm from the carina. Electronically Signed   By: Marybelle Killings M.D.   On: 02/02/2018 09:34   Dg Chest Port 1 View  Result Date: 02/01/2018 CLINICAL DATA:  Status post intubation of the trachea EXAM: PORTABLE CHEST 1 VIEW COMPARISON:   Portable chest x-ray of January 31, 2018 FINDINGS: The tip of the endotracheal tube is approximately 5 mm above the crotch of the carina. Withdrawal is needed. The lungs are well-expanded. There are confluent airspace opacities bilaterally which are stable. The left hemidiaphragm is obscured. The esophagogastric tube tip and proximal port project in the gastric cardia. The left PICC line tip projects over the cavoatrial junction. IMPRESSION: Withdrawal of the endotracheal tube by approximately 2-3 cm is recommended to avoid accidental mainstem bronchus intubation. Stable appearance of the widespread airspace opacities bilaterally. Electronically Signed   By: David  Martinique M.D.   On: 02/01/2018 09:25   Dg Chest Port 1 View  Result Date: 01/31/2018 CLINICAL DATA:  ET tube EXAM: PORTABLE CHEST 1 VIEW COMPARISON:  01/30/2018 FINDINGS: Endotracheal tube, left PICC line and NG tube remain in place, unchanged. Diffuse opacities throughout the lungs have worsened since prior study, left greater than right. Heart is borderline in size. Suspect small effusions bilaterally. IMPRESSION: Diffuse bilateral airspace opacities have worsened, left greater than right, concerning for pneumonia. Small bilateral effusions. Electronically Signed   By: Rolm Baptise M.D.   On: 01/31/2018 07:36   Dg Abd Portable 1v  Result Date: 02/02/2018 CLINICAL DATA:  Evaluate for obstruction. No bowel movement for 1 week. EXAM: PORTABLE ABDOMEN - 1 VIEW COMPARISON:  None. FINDINGS: Moderate to large stool burden in the colon. Mild gaseous distention of the sigmoid colon. No small bowel distention to suggest small bowel obstruction. No free air organomegaly. No suspicious calcification. IMPRESSION: Moderate to large stool burden within the colon. Mild gaseous distention of the sigmoid colon, nonspecific. No convincing evidence for bowel obstruction. Electronically Signed   By: Rolm Baptise M.D.   On: 02/02/2018 11:34   Dg Swallowing Func-speech  Pathology  Result Date: 02/24/2018 Objective Swallowing Evaluation: Type of Study: MBS-Modified Barium Swallow Study  Patient Details Name: Natalie Morrison MRN: [id number] Date of Birth: [birth year] Today's Date: 02/24/2018 Time: SLP Start Time (ACUTE ONLY): 1007 -SLP Stop Time (ACUTE ONLY): 1043 SLP Time Calculation (min) (ACUTE ONLY): 36 min Past Medical History: Past Medical History: Diagnosis Date . Alcohol abuse  . Anemia  . Anxiety  . Anxiety and depression  . Asthma  . Depression  . Hepatitis A   "when I was a kid" . Hypertension  Past Surgical History: Past Surgical History: Procedure Laterality Date . BREAST LUMPECTOMY Right  . TONSILLECTOMY AND ADENOIDECTOMY Bilateral over 30 years ago HPI: Pt is a 47 year old female with PMH of alcohol abuse, asthma, anxiety and depression presenting with shortness of breath and fever. She was recently hospitalized from 4/25 to 6/29, course complicated by pneumonia, ARDS, UTI, and alcohol withdrawal.  She was reintubated several times for self extubation and stridor. SLP saw at that time with evidence of post-extubation/cognitive dysphagia with MBS recommending Dys 2 diet, honey thick liquids by spoon.  Subjective: pt remembers SLP from previous admission and remembers the need for thickened liquids but says she was not consistently thickening liquids at home Assessment / Plan / Recommendation  CHL IP CLINICAL IMPRESSIONS 02/24/2018 Clinical Impression Pt shows improvements since MBS last admission, likely secondary to improved mentation and increased time post-extubation. Her oral phase is now [facility]. She continues to have small amounts of thin liquids that enter her laryngeal vestibule during the swallow, suspect from decreased glottal closure given that her vocal quality remains hoarse. Liquids contact the vocal folds but are ejected with a delayed cough. It is possible that some liquid could spill further into the trachea before she elicits a cough, but this was not observed  today. She achieved better airway protection with no penetration or aspiration when using a chin tuck with thin liquids. Solids also were cleared from her pharynx with good efficiency and airway protection. Recommend that pt eat unrestricted textures and liquids, but would focus more on aspiration precautions and swallowing strategies as outlined below. Would administer pills whole in puree to facilitate esophageal transit, as was needed on testing today. SLP will f/u for tolerance and to increase utilization of strategies. SLP Visit Diagnosis Dysphagia, pharyngeal phase (R13.13) Attention and concentration deficit following -- Frontal lobe and executive function deficit following -- Impact on safety and function Mild aspiration risk   CHL IP TREATMENT RECOMMENDATION 02/24/2018 Treatment Recommendations Therapy as outlined in treatment plan below   Prognosis 02/24/2018 Prognosis for Safe Diet Advancement Good Barriers to Reach Goals -- Barriers/Prognosis Comment -- CHL IP DIET RECOMMENDATION 02/24/2018 SLP Diet Recommendations Regular solids;Thin liquid Liquid Administration via Cup;Straw Medication Administration Whole meds with puree Compensations Slow rate;Small sips/bites;Chin tuck;Other (Comment) Postural Changes Seated upright at 90 degrees;Remain semi-upright after after feeds/meals (Comment)   CHL IP OTHER RECOMMENDATIONS 02/24/2018 Recommended Consults -- Oral Care Recommendations Oral care BID Other Recommendations --   CHL IP FOLLOW UP RECOMMENDATIONS 02/24/2018 Follow up Recommendations (No Data)   CHL IP FREQUENCY AND DURATION 02/24/2018 Speech Therapy Frequency (ACUTE ONLY) min 2x/week Treatment Duration 2 weeks      CHL IP ORAL PHASE 02/24/2018 Oral Phase WFL Oral - Pudding Teaspoon -- Oral - Pudding Cup -- Oral - Honey Teaspoon -- Oral - Honey Cup -- Oral - Nectar Teaspoon -- Oral - Nectar Cup -- Oral - Nectar Straw -- Oral - Thin Teaspoon -- Oral - Thin Cup -- Oral - Thin Straw -- Oral - Puree -- Oral -  Mech Soft -- Oral - Regular -- Oral - Multi-Consistency -- Oral - Pill -- Oral Phase - Comment --  CHL IP PHARYNGEAL PHASE 02/24/2018 Pharyngeal Phase Impaired Pharyngeal- Pudding Teaspoon -- Pharyngeal -- Pharyngeal- Pudding Cup -- Pharyngeal -- Pharyngeal- Honey Teaspoon -- Pharyngeal -- Pharyngeal- Honey Cup NT Pharyngeal -- Pharyngeal- Nectar Teaspoon -- Pharyngeal -- Pharyngeal- Nectar Cup NT Pharyngeal -- Pharyngeal- Nectar Straw NT Pharyngeal -- Pharyngeal- Thin Teaspoon NT Pharyngeal -- Pharyngeal- Thin Cup Reduced airway/laryngeal closure;Penetration/Aspiration during swallow Pharyngeal Material enters airway, CONTACTS cords and then ejected out Pharyngeal- Thin Straw Reduced airway/laryngeal closure;Penetration/Aspiration during swallow Pharyngeal Material enters airway, CONTACTS cords and then ejected out Pharyngeal- Puree Reduced airway/laryngeal closure Pharyngeal -- Pharyngeal- Mechanical Soft Reduced airway/laryngeal closure Pharyngeal -- Pharyngeal- Regular -- Pharyngeal -- Pharyngeal- Multi-consistency -- Pharyngeal -- Pharyngeal- Pill Reduced airway/laryngeal closure Pharyngeal -- Pharyngeal Comment --  CHL IP CERVICAL ESOPHAGEAL PHASE 02/24/2018 Cervical Esophageal Phase WFL Pudding Teaspoon -- Pudding Cup -- Honey Teaspoon -- Honey Cup -- Nectar Teaspoon -- Nectar Cup -- Nectar Straw -- Thin Teaspoon -- Thin Cup -- Thin Straw -- Puree -- Mechanical Soft -- Regular -- Multi-consistency -- Pill -- Cervical Esophageal Comment --  No flowsheet data found. Germain Osgood 02/24/2018, 11:20 AM  Germain Osgood, M.A. CCC-SLP [phone]             Dg Swallowing Func-speech Pathology  Result Date: 02/24/2018 Objective Swallowing Evaluation: Type of Study: MBS-Modified Barium Swallow Study  Patient Details Name: Natalie Morrison MRN: [id number] Date of Birth: 07/01/1971 Today's Date: 02/24/2018 Time: SLP Start Time (ACUTE ONLY): 1446 -SLP Stop Time (ACUTE ONLY): 1501 SLP Time Calculation (min) (ACUTE  ONLY): 15 min Past Medical History: Past Medical History: Diagnosis Date . Alcohol abuse  . Anemia  . Anxiety  . Anxiety and depression  . Asthma  . Depression  . Hepatitis A   "when I was a kid" . Hypertension  Past Surgical History: Past Surgical History: Procedure Laterality Date . BREAST LUMPECTOMY Right  . TONSILLECTOMY AND ADENOIDECTOMY Bilateral over 30 years ago HPI: 47 y/o female with severe CAP causing ARDS. ETT x3: 4/25- 5/2 (self-extubated), 5/2-5-4, 5/4-5/7. PMH: EtOH abuse, HTN, hepatitis A, depression, asthma, anxiety, anemia  Subjective: pt alert but confused, following simple commands with Min cues Assessment / Plan / Recommendation CHL IP CLINICAL IMPRESSIONS 02/15/2018 Clinical Impression Pt has a mild-moderate oropharyngeal dysphagia due to mental status and multiple, prolonged intubations including one self-extubation. Oral phase is appropriate with purees and liquids but mastication is prolonged with small bites of solids. She does not achieve complete airway closure, suspect due to irritation from ETT, which allows for all liquid consistencies to enter the airway during the swallow. Thin and nectar thick liquids are silently aspirated. Intermittent coughing during the study did not appear to correlate with aspiration to make it an adequate assessment tool clinically, although her cough was intermittently productive of what looked like secretions mixed with barium. Compensatory strategies did not consistently increase airway protection with these consistencies. Only trace amounts of honey thick liquids enter the laryngeal vestibule whether administered by cup or straw, and they are spontaneously cleared upon completion of the swallow. Recommend initiation of Dys 2 diet and honey thick liquids with full supervision due to current cognitive status. SLP will f/u for tolerance and potential to advance with additional time post-extubation.  SLP Visit Diagnosis Dysphagia, oropharyngeal phase (R13.12)  Attention and concentration deficit following -- Frontal lobe and executive function deficit following -- Impact on safety and function --   CHL IP TREATMENT RECOMMENDATION 02/14/2018 Treatment Recommendations Therapy as outlined in treatment plan below   Prognosis 02/14/2018 Prognosis for Safe Diet Advancement Good Barriers to Reach Goals Cognitive deficits Barriers/Prognosis Comment -- CHL IP DIET RECOMMENDATION 02/15/2018 SLP Diet Recommendations -- Liquid Administration via -- Medication Administration -- Compensations Slow rate;Small sips/bites Postural Changes --   CHL IP OTHER RECOMMENDATIONS 02/14/2018 Recommended Consults -- Oral Care Recommendations Oral care BID Other Recommendations Order thickener from pharmacy;Prohibited food (jello, ice cream, thin soups);Remove water pitcher   CHL IP FOLLOW UP RECOMMENDATIONS 02/15/2018 Follow up Recommendations Inpatient Rehab   CHL IP FREQUENCY AND DURATION 02/14/2018 Speech Therapy Frequency (ACUTE ONLY) min 2x/week Treatment Duration 2 weeks      CHL IP ORAL PHASE 02/14/2018 Oral Phase Impaired Oral - Pudding Teaspoon -- Oral - Pudding Cup -- Oral - Honey Teaspoon -- Oral - Honey Cup WFL Oral - Nectar Teaspoon -- Oral - Nectar Cup WFL Oral - Nectar Straw WFL Oral - Thin Teaspoon WFL Oral - Thin Cup -- Oral - Thin Straw WFL Oral - Puree WFL Oral - Mech Soft Impaired mastication Oral - Regular -- Oral - Multi-Consistency -- Oral -  Pill -- Oral Phase - Comment --  CHL IP PHARYNGEAL PHASE 02/14/2018 Pharyngeal Phase Impaired Pharyngeal- Pudding Teaspoon -- Pharyngeal -- Pharyngeal- Pudding Cup -- Pharyngeal -- Pharyngeal- Honey Teaspoon -- Pharyngeal -- Pharyngeal- Honey Cup Reduced airway/laryngeal closure;Penetration/Aspiration during swallow Pharyngeal Material enters airway, remains ABOVE vocal cords then ejected out Pharyngeal- Nectar Teaspoon -- Pharyngeal -- Pharyngeal- Nectar Cup Reduced airway/laryngeal closure;Penetration/Aspiration during swallow Pharyngeal  Material enters airway, passes BELOW cords without attempt by patient to eject out (silent aspiration) Pharyngeal- Nectar Straw Reduced airway/laryngeal closure;Penetration/Aspiration during swallow Pharyngeal Material enters airway, passes BELOW cords without attempt by patient to eject out (silent aspiration) Pharyngeal- Thin Teaspoon Reduced airway/laryngeal closure Pharyngeal -- Pharyngeal- Thin Cup -- Pharyngeal -- Pharyngeal- Thin Straw Reduced airway/laryngeal closure;Penetration/Aspiration during swallow;Compensatory strategies attempted (with notebox) Pharyngeal Material enters airway, passes BELOW cords without attempt by patient to eject out (silent aspiration) Pharyngeal- Puree WFL Pharyngeal -- Pharyngeal- Mechanical Soft WFL Pharyngeal -- Pharyngeal- Regular -- Pharyngeal -- Pharyngeal- Multi-consistency -- Pharyngeal -- Pharyngeal- Pill -- Pharyngeal -- Pharyngeal Comment --  CHL IP CERVICAL ESOPHAGEAL PHASE 02/14/2018 Cervical Esophageal Phase WFL Pudding Teaspoon -- Pudding Cup -- Honey Teaspoon -- Honey Cup -- Nectar Teaspoon -- Nectar Cup -- Nectar Straw -- Thin Teaspoon -- Thin Cup -- Thin Straw -- Puree -- Mechanical Soft -- Regular -- Multi-consistency -- Pill -- Cervical Esophageal Comment -- No flowsheet data found. Germain Osgood 02/24/2018, 8:26 AM  Germain Osgood, M.A. CCC-SLP [phone]             US Abdomen Limited Ruq  Result Date: 02/22/2018 CLINICAL DATA:  One-day history of chest pain. EXAM: ULTRASOUND ABDOMEN LIMITED RIGHT UPPER QUADRANT COMPARISON:  10/30/2016 FINDINGS: Gallbladder: No gallstones or gallbladder wall thickening. No pericholecystic fluid. The sonographer reports no sonographic Murphy's sign. Common bile duct: Diameter: 3 mm Liver: Increased echogenicity diffusely in the liver parenchyma. No focal abnormality evident. Liver measures at least 19 cm craniocaudal length, enlarged. IMPRESSION: 1. No cholelithiasis or evidence of cholecystitis. 2. Hepatomegaly  with probable steatosis. Electronically Signed   By: Misty Stanley M.D.   On: 02/22/2018 07:33       Subjective: Denies  dyspnea. Denies cough.   Discharge Exam: Vitals:   03/01/18 0034 03/01/18 0839  BP: 110/75 113/82  Pulse: (!) 56 77  Resp:  16  Temp: 97.9 F (36.6 C) 97.9 F (36.6 C)  SpO2: 97% 97%   Vitals:   02/28/18 1731 03/01/18 0034 03/01/18 0500 03/01/18 0839  BP: 98/78 110/75  113/82  Pulse: 75 (!) 56  77  Resp: 18   16  Temp: 98.2 F (36.8 C) 97.9 F (36.6 C)  97.9 F (36.6 C)  TempSrc: Oral Oral  Oral  SpO2: 97% 97%  97%  Weight:   55.8 kg (123 lb 0.3 oz)   Height:        General: Pt is alert, awake, not in acute distress Cardiovascular: RRR, S1/S2 +, no rubs, no gallops Respiratory: CTA bilaterally, no wheezing, no rhonchi Abdominal: Soft, NT, ND, bowel sounds + Extremities: no edema, no cyanosis    The results of significant diagnostics from this hospitalization (including imaging, microbiology, ancillary and laboratory) are listed below for reference.     Microbiology: Recent Results (from the past 240 hour(s))  Culture, blood (Routine x 2)     Status: None   Collection Time: 02/22/18  1:56 AM  Result Value Ref Range Status   Specimen Description BLOOD LEFT HAND  Final   Special Requests  Final    BOTTLES DRAWN AEROBIC AND ANAEROBIC Blood Culture results may not be optimal due to an inadequate volume of blood received in culture bottles   Culture   Final    NO GROWTH 5 DAYS Performed at [facility], [place] [address] [place], [place] [address]    Report Status 02/27/2018 FINAL  Final  Culture, blood (Routine x 2)     Status: None   Collection Time: 02/22/18  1:56 AM  Result Value Ref Range Status   Specimen Description BLOOD LEFT FOREARM  Final   Special Requests   Final    BOTTLES DRAWN AEROBIC AND ANAEROBIC Blood Culture adequate volume   Culture   Final    NO GROWTH 5 DAYS Performed at [facility], [place] [address] [place], [place] [address]    Report Status 02/27/2018 FINAL  Final  Urine culture     Status: None   Collection Time: 02/22/18  3:00 AM  Result Value Ref Range Status   Specimen Description URINE, RANDOM  Final   Special Requests NONE  Final   Culture   Final    NO GROWTH Performed at [facility], [place] [address], [place], [place] [address]    Report Status 02/23/2018 FINAL  Final  Respiratory Panel by PCR     Status: None   Collection Time: 02/22/18  6:32 AM  Result Value Ref Range Status   Adenovirus NOT DETECTED NOT DETECTED Final   Coronavirus 229E NOT DETECTED NOT DETECTED Final   Coronavirus HKU1 NOT DETECTED NOT DETECTED Final   Coronavirus NL63 NOT DETECTED NOT DETECTED Final   Coronavirus OC43 NOT DETECTED NOT DETECTED Final   Metapneumovirus NOT DETECTED NOT DETECTED Final   Rhinovirus / Enterovirus NOT DETECTED NOT DETECTED Final   Influenza A NOT DETECTED NOT DETECTED Final   Influenza B NOT DETECTED NOT DETECTED Final   Parainfluenza Virus 1 NOT DETECTED NOT DETECTED Final   Parainfluenza Virus 2 NOT DETECTED NOT DETECTED Final   Parainfluenza Virus 3 NOT DETECTED NOT DETECTED Final   Parainfluenza Virus 4 NOT DETECTED NOT DETECTED Final   Respiratory Syncytial Virus NOT DETECTED NOT DETECTED Final   Bordetella pertussis NOT DETECTED NOT DETECTED Final   Chlamydophila pneumoniae NOT DETECTED NOT DETECTED Final   Mycoplasma pneumoniae NOT DETECTED NOT DETECTED Final    Comment: Performed at [facility], [place] [address], [place], [place] [address]  MRSA PCR Screening     Status: Abnormal   Collection Time: 02/22/18  6:32 AM  Result Value Ref Range Status   MRSA by PCR INVALID RESULTS, SPECIMEN SENT FOR CULTURE (A) NEGATIVE Final    Comment:        The GeneXpert MRSA Assay (FDA approved for NASAL specimens only), is one component of a comprehensive MRSA colonization surveillance program. It is not intended to diagnose MRSA infection nor to guide  or monitor treatment for MRSA infections. Performed at [facility], [place] [address], [place], [place] [address]   MRSA culture     Status: None   Collection Time: 02/22/18  7:32 AM  Result Value Ref Range Status   Specimen Description NASAL SWAB  Final   Special Requests NONE  Final   Culture   Final    NO MRSA DETECTED Performed at [facility], [address] [place], [place] [address]    Report Status 02/23/2018 FINAL  Final     Labs:  BNP (last 3 results) Recent Labs    02/22/18 0452  BNP 025.8*   Basic Metabolic Panel: Recent Labs  Lab 02/25/18 0327 02/26/18 0035 02/27/18 0250 02/28/18 0330 03/01/18 0237  NA 135 137 134* 138 136  K 4.3 4.3 4.1 4.2 3.2*  CL 97* 98* 96* 97* 95*  CO2 _0 GLUCOSE 296* 271* 158* 163* 150*  BUN 16 20 26* 28* 26*  CREATININE 0.64 0.69 0.70 0.86 0.85  CALCIUM 8.8* 9.1 8.8* 9.1 9.1  MG 1.9 1.7 1.9 2.0 1.9  PHOS 4.2  --   --   --   --    Liver Function Tests: No results for input(s): AST, ALT, ALKPHOS, BILITOT, PROT, ALBUMIN in the last 168 hours. No results for input(s): LIPASE, AMYLASE in the last 168 hours. No results for input(s): AMMONIA in the last 168 hours. CBC: Recent Labs  Lab 02/25/18 0327 02/26/18 0035 02/27/18 0250 02/28/18 0330 03/01/18 0237  WBC 20.3* 36.6* 35.2* 25.0* 19.7*  HGB 9.4* 9.4* 9.6* 9.9* 10.5*  HCT 28.6* 28.7* 29.2* 30.5* 32.4*  MCV 101.8* 101.4* 102.5* 102.3* 101.9*  PLT 353 401* 440* 485* 462*   Cardiac Enzymes: Recent Labs  Lab 02/22/18 1352  CKTOTAL 10*   BNP: Invalid input(s): POCBNP CBG: Recent Labs  Lab 02/28/18 0809 02/28/18 1141 02/28/18 1716 02/28/18 2119 03/01/18 0819  GLUCAP 115* 105* 173* 188* 83   D-Dimer No results for input(s): DDIMER in the last 72 hours. Hgb A1c No results for input(s): HGBA1C in the last 72 hours. Lipid Profile No results for input(s): CHOL, HDL, LDLCALC, TRIG, CHOLHDL, LDLDIRECT in the last 72 hours. Thyroid function  studies No results for input(s): TSH, T4TOTAL, T3FREE, THYROIDAB in the last 72 hours.  Invalid input(s): FREET3 Anemia work up No results for input(s): VITAMINB12, FOLATE, FERRITIN, TIBC, IRON, RETICCTPCT in the last 72 hours. Urinalysis    Component Value Date/Time   COLORURINE STRAW (A) 02/22/2018 0300   APPEARANCEUR CLEAR 02/22/2018 0300   LABSPEC 1.003 (L) 02/22/2018 0300   PHURINE 5.0 02/22/2018 0300   GLUCOSEU NEGATIVE 02/22/2018 0300   HGBUR NEGATIVE 02/22/2018 0300   HGBUR negative 01/01/2010 1340   BILIRUBINUR NEGATIVE 02/22/2018 0300   BILIRUBINUR 3+ 03/18/2016 1614   KETONESUR NEGATIVE 02/22/2018 0300   PROTEINUR NEGATIVE 02/22/2018 0300   UROBILINOGEN 2.0 03/18/2016 1614   UROBILINOGEN 0.2 05/06/2013 1440   NITRITE NEGATIVE 02/22/2018 0300   LEUKOCYTESUR NEGATIVE 02/22/2018 0300   Sepsis Labs Invalid input(s): PROCALCITONIN,  WBC,  LACTICIDVEN Microbiology Recent Results (from the past 240 hour(s))  Culture, blood (Routine x 2)     Status: None   Collection Time: 02/22/18  1:56 AM  Result Value Ref Range Status   Specimen Description BLOOD LEFT HAND  Final   Special Requests   Final    BOTTLES DRAWN AEROBIC AND ANAEROBIC Blood Culture results may not be optimal due to an inadequate volume of blood received in culture bottles   Culture   Final    NO GROWTH 5 DAYS Performed at [facility], [place] [address] [place], [place] [address]    Report Status 02/27/2018 FINAL  Final  Culture, blood (Routine x 2)     Status: None   Collection Time: 02/22/18  1:56 AM  Result Value Ref Range Status   Specimen Description BLOOD LEFT FOREARM  Final   Special Requests   Final    BOTTLES DRAWN AEROBIC AND ANAEROBIC Blood Culture adequate volume  Culture   Final    NO GROWTH 5 DAYS Performed at [facility], [place] [address], [place], [place] [address]    Report Status 02/27/2018 FINAL  Final  Urine culture     Status: None   Collection Time: 02/22/18  3:00 AM   Result Value Ref Range Status   Specimen Description URINE, RANDOM  Final   Special Requests NONE  Final   Culture   Final    NO GROWTH Performed at [facility], [place] [address], [place], [place] [address]    Report Status 02/23/2018 FINAL  Final  Respiratory Panel by PCR     Status: None   Collection Time: 02/22/18  6:32 AM  Result Value Ref Range Status   Adenovirus NOT DETECTED NOT DETECTED Final   Coronavirus 229E NOT DETECTED NOT DETECTED Final   Coronavirus HKU1 NOT DETECTED NOT DETECTED Final   Coronavirus NL63 NOT DETECTED NOT DETECTED Final   Coronavirus OC43 NOT DETECTED NOT DETECTED Final   Metapneumovirus NOT DETECTED NOT DETECTED Final   Rhinovirus / Enterovirus NOT DETECTED NOT DETECTED Final   Influenza A NOT DETECTED NOT DETECTED Final   Influenza B NOT DETECTED NOT DETECTED Final   Parainfluenza Virus 1 NOT DETECTED NOT DETECTED Final   Parainfluenza Virus 2 NOT DETECTED NOT DETECTED Final   Parainfluenza Virus 3 NOT DETECTED NOT DETECTED Final   Parainfluenza Virus 4 NOT DETECTED NOT DETECTED Final   Respiratory Syncytial Virus NOT DETECTED NOT DETECTED Final   Bordetella pertussis NOT DETECTED NOT DETECTED Final   Chlamydophila pneumoniae NOT DETECTED NOT DETECTED Final   Mycoplasma pneumoniae NOT DETECTED NOT DETECTED Final    Comment: Performed at [facility], [place] [address], [place], [place] [address]  MRSA PCR Screening     Status: Abnormal   Collection Time: 02/22/18  6:32 AM  Result Value Ref Range Status   MRSA by PCR INVALID RESULTS, SPECIMEN SENT FOR CULTURE (A) NEGATIVE Final    Comment:        The GeneXpert MRSA Assay (FDA approved for NASAL specimens only), is one component of a comprehensive MRSA colonization surveillance program. It is not intended to diagnose MRSA infection nor to guide or monitor treatment for MRSA infections. Performed at [facility], [place] [address], [place], [place] [address]   MRSA culture      Status: None   Collection Time: 02/22/18  7:32 AM  Result Value Ref Range Status   Specimen Description NASAL SWAB  Final   Special Requests NONE  Final   Culture   Final    NO MRSA DETECTED Performed at [facility], [address], [place],  [address]    Report Status 02/23/2018 FINAL  Final     Time coordinating discharge: 35 minutes.   SIGNED:   Elmarie Shiley, MD  [facility] 03/01/2018, 9:02 AM Pager [phone]  If 7PM-7AM, please contact night-coverage [email or web address] Password [email or web address]

## 2018-03-03 ENCOUNTER — Telehealth: Payer: Self-pay | Admitting: Family Medicine

## 2018-03-03 NOTE — Telephone Encounter (Signed)
Copied from [place] [id number]. Topic: Quick Communication - See Telephone Encounter >> Mar 03, 2018  3:49 PM Ether Griffins B wrote: CRM for notification. See Telephone encounter for: 03/03/18.  Holly with [facility] calling to make Dr. Elease Hashimoto aware she has been trying to get in touch with pt today to resume care. She as unable  to get in touch with the pt and she will keep trying to get in touch with her.

## 2018-03-04 ENCOUNTER — Telehealth: Payer: Self-pay | Admitting: Family Medicine

## 2018-03-04 ENCOUNTER — Encounter: Payer: Self-pay | Admitting: Family Medicine

## 2018-03-04 ENCOUNTER — Ambulatory Visit: Payer: BLUE CROSS/BLUE SHIELD | Admitting: Family Medicine

## 2018-03-04 VITALS — BP 102/70 | Temp 97.8°F | Wt 116.9 lb

## 2018-03-04 DIAGNOSIS — E440 Moderate protein-calorie malnutrition: Secondary | ICD-10-CM | POA: Diagnosis not present

## 2018-03-04 DIAGNOSIS — F1010 Alcohol abuse, uncomplicated: Secondary | ICD-10-CM

## 2018-03-04 DIAGNOSIS — J189 Pneumonia, unspecified organism: Secondary | ICD-10-CM | POA: Diagnosis not present

## 2018-03-04 DIAGNOSIS — E876 Hypokalemia: Secondary | ICD-10-CM | POA: Diagnosis not present

## 2018-03-04 DIAGNOSIS — D72829 Elevated white blood cell count, unspecified: Secondary | ICD-10-CM | POA: Diagnosis not present

## 2018-03-04 DIAGNOSIS — J9601 Acute respiratory failure with hypoxia: Secondary | ICD-10-CM | POA: Diagnosis not present

## 2018-03-04 LAB — CBC WITH DIFFERENTIAL/PLATELET
Basophils Absolute: 0.1 10*3/uL (ref 0.0–0.1)
Basophils Relative: 0.2 % (ref 0.0–3.0)
Eosinophils Absolute: 0 10*3/uL (ref 0.0–0.7)
Eosinophils Relative: 0 % (ref 0.0–5.0)
HCT: 36.5 % (ref 36.0–46.0)
Hemoglobin: 12 g/dL (ref 12.0–15.0)
Lymphocytes Relative: 7.8 % — ABNORMAL LOW (ref 12.0–46.0)
Lymphs Abs: 2 10*3/uL (ref 0.7–4.0)
MCHC: 32.8 g/dL (ref 30.0–36.0)
MCV: 102.7 fl — ABNORMAL HIGH (ref 78.0–100.0)
Monocytes Absolute: 1.7 10*3/uL — ABNORMAL HIGH (ref 0.1–1.0)
Monocytes Relative: 6.6 % (ref 3.0–12.0)
Neutro Abs: 21.4 10*3/uL — ABNORMAL HIGH (ref 1.4–7.7)
Neutrophils Relative %: 85.4 % — ABNORMAL HIGH (ref 43.0–77.0)
Platelets: 626 10*3/uL — ABNORMAL HIGH (ref 150.0–400.0)
RBC: 3.55 Mil/uL — ABNORMAL LOW (ref 3.87–5.11)
RDW: 15.3 % (ref 11.5–15.5)
WBC: 25.1 10*3/uL (ref 4.0–10.5)

## 2018-03-04 LAB — BASIC METABOLIC PANEL
BUN: 19 mg/dL (ref 6–23)
CO2: 27 mEq/L (ref 19–32)
Calcium: 10.1 mg/dL (ref 8.4–10.5)
Chloride: 99 mEq/L (ref 96–112)
Creatinine, Ser: 0.53 mg/dL (ref 0.40–1.20)
GFR: 130.8 mL/min (ref >60.00)
Glucose, Bld: 94 mg/dL (ref 70–99)
Potassium: 5.2 mEq/L — ABNORMAL HIGH (ref 3.5–5.1)
Sodium: 135 mEq/L (ref 135–145)

## 2018-03-04 NOTE — Patient Instructions (Signed)
Follow up immediately for any fever or increased shortness of breath. 

## 2018-03-04 NOTE — Progress Notes (Signed)
Subjective:     Patient ID: Natalie Morrison, female   DOB: [birth year], 47 y.o.   MRN: [id number]  HPI Patient seen for hospital follow-up. She was actually admitted April 25 and discharged on 02/16/18 and then readmitted on the 21st and discharge on the 28th.  Prior to her initial admission she had about a month of some cough and had been treated with outpatient antibiotics and was admitted with pneumonia and ARDS. Blood cultures remained negative. She had bronchoscopy on the 29th. She was intubated for several days and self extubated and had to be reintubated again. Her hospital course was complicated by acute alcohol withdrawal. CT head revealed no acute changes but did show some advanced atrophy for age felt to be secondary to alcohol.  She was very debilitated after first hospitalization and was discharged with some physical therapy. One day prior to her second admission she developed some recurrent fever, chills, increased cough and increasing shortness of breath. She was admitted with fever, hypotension, tachycardia and recurrent hypoxia. White count 42,000. Lactic acid 4.01 chest x-ray on second admission showed persistent left mid and lower opacities. Repeat blood and urine cultures were done and patient was started on broad spectrum antibiotics with vancomycin and Zosyn. Differential was aspiration pneumonia versus pneumonitis. Her blood cultures remained negative. She was discharged on Levaquin and prednisone taper. She has only today and tomorrow and she'll be off the prednisone.  There was also diagnosis of acute on chronic diastolic heart failure. She was discharged on outpatient Lasix 20 mg daily as needed but states her edema has been stable. She feels that she is much improved at this time with no cough and no fever. No chills. Still very weak and fatigued overall but slowly improving. Her appetite is improving. She had recent modified barium swallow and was cleared for regular diet. Her  studies were normal. B12 level normal.  Patient has long history of alcohol abuse. Counseling was recently provided. She declines outpatient programs at this time but states she will consider. There was mention of starting outpatient Prozac 20 mg daily but she never started on that.  She declines outpatient occupational therapy or physical therapy at this time  Past Medical History:  Diagnosis Date  . Alcohol abuse   . Anemia   . Anxiety   . Anxiety and depression   . Asthma   . Depression   . Hepatitis A    "when I was a kid"  . Hypertension    Past Surgical History:  Procedure Laterality Date  . BREAST LUMPECTOMY Right   . TONSILLECTOMY AND ADENOIDECTOMY Bilateral over 30 years ago    reports that she quit smoking about 4 weeks ago. Her smoking use included cigarettes. She has a 16.50 pack-year smoking history. She has never used smokeless tobacco. She reports that she drinks about 4.2 oz of alcohol per week. She reports that she has current or past drug history. Drug: Marijuana. Frequency: 1.00 time per week. family history includes Heart disease in her father and mother; Liver disease in her mother. Allergies  Allergen Reactions  . Nitrofurantoin Hives  . Other Other (See Comments)    Allergies mold, dust per allergy test  . Sulfonamide Derivatives Other (See Comments)    Unknown allergic reaction per husband      Review of Systems  Constitutional: Positive for fatigue. Negative for chills and fever.  HENT: Negative for trouble swallowing.   Respiratory: Negative for cough, shortness of breath and wheezing.  Gastrointestinal: Negative for abdominal pain, diarrhea, nausea and vomiting.  Genitourinary: Negative for dysuria.  Neurological: Positive for weakness. Negative for dizziness.       Objective:   Physical Exam  Constitutional: She is oriented to person, place, and time.  Thin 47 year old female no distress  Cardiovascular: Normal rate and regular rhythm.   Pulmonary/Chest: Effort normal and breath sounds normal. No respiratory distress. She has no wheezes. She has no rales.  Musculoskeletal: She exhibits no edema.  Neurological: She is alert and oriented to person, place, and time. No cranial nerve deficit.  Skin: No rash noted.  Psychiatric: She has a normal mood and affect. Her behavior is normal.       Assessment:     #1 recent acute respiratory failure with hypoxia/ARDS/HCAP-currently improved clinically Very debilitated physically with fairly substantial weight loss.  #2 macrocytic anemia with recent normal B12 and normal iron studies  #3 recent mild hyperglycemia related to steroids  #4 leukocytosis probably related to recent steroids  #5 mild hypokalemia with potassium 3.2 day of discharge from hospital  #6 long-standing history of alcohol abuse    Plan:     -Recommend continue multivitamin with thiamine and folic acid -Recheck labs with CBC and basic metabolic panel -She declines further physical therapy and occupational therapy this time -We discussed maintenance of abstinence from alcohol and she understands importance of not going back with alcohol or smoking at this point. We recommended she consider outpatient programs and she will think this over -Follow-up immediately for any recurrent fever, cough, or dyspnea  Eulas Post MD [facility] at [facility]

## 2018-03-04 NOTE — Telephone Encounter (Signed)
noted 

## 2018-03-04 NOTE — Telephone Encounter (Signed)
Call report-critical lab WBC 25.1, will print this message and give to Dr. Elease Hashimoto for review.

## 2018-03-07 ENCOUNTER — Other Ambulatory Visit: Payer: Self-pay | Admitting: Family Medicine

## 2018-03-07 DIAGNOSIS — D72829 Elevated white blood cell count, unspecified: Secondary | ICD-10-CM

## 2018-03-10 ENCOUNTER — Telehealth: Payer: Self-pay | Admitting: Family Medicine

## 2018-03-10 ENCOUNTER — Other Ambulatory Visit (INDEPENDENT_AMBULATORY_CARE_PROVIDER_SITE_OTHER): Payer: BLUE CROSS/BLUE SHIELD

## 2018-03-10 DIAGNOSIS — D72829 Elevated white blood cell count, unspecified: Secondary | ICD-10-CM | POA: Diagnosis not present

## 2018-03-10 LAB — CBC WITH DIFFERENTIAL/PLATELET
Basophils Absolute: 0 10*3/uL (ref 0.0–0.1)
Basophils Relative: 0.3 % (ref 0.0–3.0)
Eosinophils Absolute: 0.1 10*3/uL (ref 0.0–0.7)
Eosinophils Relative: 0.9 % (ref 0.0–5.0)
HCT: 31.6 % — ABNORMAL LOW (ref 36.0–46.0)
Hemoglobin: 10.7 g/dL — ABNORMAL LOW (ref 12.0–15.0)
Lymphocytes Relative: 21.5 % (ref 12.0–46.0)
Lymphs Abs: 3.3 10*3/uL (ref 0.7–4.0)
MCHC: 33.9 g/dL (ref 30.0–36.0)
MCV: 104.2 fl — ABNORMAL HIGH (ref 78.0–100.0)
Monocytes Absolute: 2 10*3/uL — ABNORMAL HIGH (ref 0.1–1.0)
Monocytes Relative: 13 % — ABNORMAL HIGH (ref 3.0–12.0)
Neutro Abs: 9.7 10*3/uL — ABNORMAL HIGH (ref 1.4–7.7)
Neutrophils Relative %: 64.3 % (ref 43.0–77.0)
Platelets: 339 10*3/uL (ref 150.0–400.0)
RBC: 3.03 Mil/uL — ABNORMAL LOW (ref 3.87–5.11)
RDW: 16.2 % — ABNORMAL HIGH (ref 11.5–15.5)
WBC: 15.1 10*3/uL — ABNORMAL HIGH (ref 4.0–10.5)

## 2018-03-10 NOTE — Telephone Encounter (Signed)
Patient needs to pick up a fax confirmation from her FMLA paperwork-- stated that she was told it was ready to be picked up.

## 2018-03-11 NOTE — Telephone Encounter (Signed)
Forms ready for pick up and patient is aware

## 2018-03-14 LAB — ACID FAST CULTURE WITH REFLEXED SENSITIVITIES (MYCOBACTERIA): Acid Fast Culture: NEGATIVE

## 2018-03-29 ENCOUNTER — Ambulatory Visit: Payer: BLUE CROSS/BLUE SHIELD | Admitting: Family Medicine

## 2018-03-29 ENCOUNTER — Encounter: Payer: Self-pay | Admitting: Family Medicine

## 2018-03-29 VITALS — BP 130/82 | HR 112 | Temp 97.9°F | Wt 121.7 lb

## 2018-03-29 DIAGNOSIS — J189 Pneumonia, unspecified organism: Secondary | ICD-10-CM | POA: Diagnosis not present

## 2018-03-29 DIAGNOSIS — I10 Essential (primary) hypertension: Secondary | ICD-10-CM | POA: Diagnosis not present

## 2018-03-29 DIAGNOSIS — R790 Abnormal level of blood mineral: Secondary | ICD-10-CM | POA: Diagnosis not present

## 2018-03-29 DIAGNOSIS — D72829 Elevated white blood cell count, unspecified: Secondary | ICD-10-CM

## 2018-03-29 LAB — CBC WITH DIFFERENTIAL/PLATELET
Basophils Absolute: 0 10*3/uL (ref 0.0–0.1)
Basophils Relative: 0.5 % (ref 0.0–3.0)
Eosinophils Absolute: 0.1 10*3/uL (ref 0.0–0.7)
Eosinophils Relative: 1.4 % (ref 0.0–5.0)
HCT: 38.2 % (ref 36.0–46.0)
Hemoglobin: 13.1 g/dL (ref 12.0–15.0)
Lymphocytes Relative: 41.1 % (ref 12.0–46.0)
Lymphs Abs: 3.2 10*3/uL (ref 0.7–4.0)
MCHC: 34.3 g/dL (ref 30.0–36.0)
MCV: 101.6 fl — ABNORMAL HIGH (ref 78.0–100.0)
Monocytes Absolute: 0.8 10*3/uL (ref 0.1–1.0)
Monocytes Relative: 10.1 % (ref 3.0–12.0)
Neutro Abs: 3.6 10*3/uL (ref 1.4–7.7)
Neutrophils Relative %: 46.9 % (ref 43.0–77.0)
Platelets: 383 10*3/uL (ref 150.0–400.0)
RBC: 3.76 Mil/uL — ABNORMAL LOW (ref 3.87–5.11)
RDW: 15.4 % (ref 11.5–15.5)
WBC: 7.7 10*3/uL (ref 4.0–10.5)

## 2018-03-29 LAB — MAGNESIUM: Magnesium: 1.7 mg/dL (ref 1.5–2.5)

## 2018-03-29 NOTE — Progress Notes (Signed)
Subjective:     Patient ID: Natalie Morrison, female   DOB: Nov 07, 1970, 47 y.o.   MRN: [id number]  HPI Patient seen for follow-up regarding recent hospitalization. Refer to recent note of 03/04/18. She was admitted back in April and had lengthy hospitalization for community-acquired pneumonia complicated by ARDS and alcohol withdrawal. She had become very debilitated following hospitalization. She was discharged with physical therapy but declined home physical therapy. She was then readmitted with elevated white count and elevated lactic acid and repeat blood cultures negative.  She's done well since last visit. She's gained about 5 pounds which she attributes to increased eating. She's tried to eat snacks every couple hours. No peripheral edema. No dyspnea. She's been walking some for exercise. No difficulties with negotiating stairs. She quit smoking with recent hospitalization and currently has nicotine patch on. Recent white count 15,000 with hemoglobin 10.7.  Unfortunately though she is still drinking some alcohol but she has scaled back greatly. She has declined outpatient programs  Past Medical History:  Diagnosis Date  . Alcohol abuse   . Anemia   . Anxiety   . Anxiety and depression   . Asthma   . Depression   . Hepatitis A    "when I was a kid"  . Hypertension    Past Surgical History:  Procedure Laterality Date  . BREAST LUMPECTOMY Right   . TONSILLECTOMY AND ADENOIDECTOMY Bilateral over 30 years ago    reports that she quit smoking about 8 weeks ago. Her smoking use included cigarettes. She has a 16.50 pack-year smoking history. She has never used smokeless tobacco. She reports that she drinks about 4.2 oz of alcohol per week. She reports that she has current or past drug history. Drug: Marijuana. Frequency: 1.00 time per week. family history includes Heart disease in her father and mother; Liver disease in her mother. Allergies  Allergen Reactions  . Nitrofurantoin Hives  .  Other Other (See Comments)    Allergies mold, dust per allergy test  . Sulfonamide Derivatives Other (See Comments)    Unknown allergic reaction per husband      Review of Systems  Constitutional: Negative for chills and fever.  Respiratory: Negative for cough, shortness of breath and wheezing.   Cardiovascular: Negative for chest pain and leg swelling.  Gastrointestinal: Negative for abdominal pain, diarrhea, nausea and vomiting.  Genitourinary: Negative for dysuria.  Neurological: Negative for dizziness.       Objective:   Physical Exam  Constitutional: She appears well-developed and well-nourished.  Neck: Neck supple.  Cardiovascular: Normal rate and regular rhythm.  Pulmonary/Chest: Effort normal and breath sounds normal. No respiratory distress. She has no wheezes. She has no rales.  Musculoskeletal: She exhibits no edema.  Neurological: She is alert.       Assessment:     #1 recent community acquired pneumonia complicated by ARDS, diastolic heart failure, and alcohol withdrawal  #2 macrocytic anemia with recent normal B12 and iron studies  #3 hypomagnesemia during recent hospitalization.  #4 leukocytosis probably exacerbated by recent steroid use  #5 alcohol abuse with ongoing use  #6 history of nicotine use. Currently using nicotine patches    Plan:     -Repeat CBC and magnesium level -Continue multivitamin with vitamin and folate -Again discussed importance of total abstinence from alcohol. We've offered outpatient programs and she declines at this time -We agreed to return to work date of July 1. She also try this without any reduced work schedule  Alinda Sierras  Burchette MD [facility] at [facility]

## 2018-03-30 ENCOUNTER — Telehealth: Payer: Self-pay | Admitting: Family Medicine

## 2018-03-30 NOTE — Telephone Encounter (Deleted)
Please call patient with results.

## 2018-03-30 NOTE — Telephone Encounter (Signed)
Copied from [place]. Topic: Quick Communication - Lab Results >> Mar 30, 2018 10:00 AM Rebecca Eaton, CMA wrote: Called patient to inform them of 03/29/18 lab results. When patient returns call, triage nurse may disclose results.

## 2018-03-30 NOTE — Telephone Encounter (Signed)
Result notes entered by Dr Elease Hashimoto read to patient results documented in result notes entered as well

## 2018-04-16 ENCOUNTER — Other Ambulatory Visit: Payer: Self-pay | Admitting: Family Medicine

## 2018-04-26 ENCOUNTER — Telehealth: Payer: Self-pay | Admitting: Family Medicine

## 2018-04-26 NOTE — Telephone Encounter (Signed)
[employer] faxed FMLA forms for the patient  Fax the forms to: [phone]  Disposition: Dr's Folder

## 2018-04-27 NOTE — Telephone Encounter (Signed)
Placed in Dr Burchette's folder 

## 2018-06-07 ENCOUNTER — Telehealth: Payer: Self-pay | Admitting: Family Medicine

## 2018-06-07 DIAGNOSIS — Z720 Tobacco use: Secondary | ICD-10-CM

## 2018-06-07 DIAGNOSIS — R059 Cough, unspecified: Secondary | ICD-10-CM

## 2018-06-07 DIAGNOSIS — J189 Pneumonia, unspecified organism: Secondary | ICD-10-CM

## 2018-06-07 DIAGNOSIS — R05 Cough: Secondary | ICD-10-CM

## 2018-06-07 NOTE — Telephone Encounter (Signed)
OK to refer.  I do think she would be good to get some baseline PFTs.

## 2018-06-07 NOTE — Telephone Encounter (Signed)
Copied from [place] [id number]. Topic: Referral - Request >> Jun 07, 2018  9:29 AM Lennox Solders wrote: Reason for CRM: pt is calling and would like a referral to pulmonologist . Pt was a smoker for 30 years and has asthma and pt had double pna last spring

## 2018-06-07 NOTE — Telephone Encounter (Signed)
Referral placed.

## 2018-06-13 ENCOUNTER — Telehealth: Payer: Self-pay | Admitting: Family Medicine

## 2018-06-13 NOTE — Telephone Encounter (Signed)
She also has hx of asthma which should justify one time pneumovax prior to age 47.  Prevnar 13 would be recommended for patients on immuno-suppresion (eg Humira).  I do not see indication for Prevnar and pneumovax- only the pneumovax.

## 2018-06-13 NOTE — Telephone Encounter (Signed)
Please advise request. I see dx of alcohol abuse on problem list that may make her a candidate for PPSV 23 but I do not see anything on problem list or PMH that indicates need for PCV 13.

## 2018-06-13 NOTE — Telephone Encounter (Signed)
Copied from [place] [id number]. Topic: Quick Communication - See Telephone Encounter >> Jun 13, 2018  3:03 PM Bea Graff, NT wrote: CRM for notification. See Telephone encounter for: 06/13/18. Pt calling to see if a rx for a prevnar and pneumonia vaccine can be called into her pharmacy.[id number] - [place], [place] - [address] [phone] (Phone) [phone] (Fax)

## 2018-06-14 MED ORDER — PNEUMOCOCCAL VAC POLYVALENT 25 MCG/0.5ML IJ INJ: 0.5000 mL | INJECTION | Freq: Once | INTRAMUSCULAR | 0 refills | Status: AC

## 2018-06-14 NOTE — Progress Notes (Signed)
Synopsis: Referred in September 2019 for shortness of breath by Eulas Post, MD  Subjective:   PATIENT ID: Natalie Morrison GENDER: female DOB: 1971-09-23, MRN: [id number]  Chief Complaint  Patient presents with  . Follow-up    Repeated PNE, last seen 2017. ER 02/22/2018. States her breathing is stable at the moment. Only uses albuterol inhaler prn. Denies having cough.     47 yo FM with a PMH of asthma and alcohol abuse.  Patient had recent hospitalization in April 2019 for community-acquired pneumonia comp located by ARDS and alcohol withdrawal.  She was very debilitated following her prolonged hospitalization. She doesn't remember a lot about that time.  She was diagnosed with asthma 10 years ago. Former smoker 33 years, 1 ppd and quit in April after her hospitalization. She has been gaining weight in which she needed. She has stopped alcohol use at this time.   She recently lost her job at [employer]. She feels like her breathing symptoms are better. She use to see an allergist but has seen seen them in the past 3 years and was treated for her allergies and asthma. She currently used OTC zrytec, flonase and prn albuterol. She was on a maintence inhaler in the past but not currently. Currently using it 1-2 times per week.   Currently going to the gym regularly and trying to take care of herself.    Past Medical History:  Diagnosis Date  . Alcohol abuse   . Anemia   . Anxiety   . Anxiety and depression   . Asthma   . Depression   . Hepatitis A    "when I was a kid"  . Hypertension      Family History  Problem Relation Age of Onset  . Heart disease Mother   . Liver disease Mother   . Heart disease Father      Social History   Socioeconomic History  . Marital status: Married    Spouse name: Not on file  . Number of children: Not on file  . Years of education: Not on file  . Highest education level: Not on file  Occupational History  . Not on file  Social Needs  .  Financial resource strain: Not on file  . Food insecurity:    Worry: Not on file    Inability: Not on file  . Transportation needs:    Medical: Not on file    Non-medical: Not on file  Tobacco Use  . Smoking status: Former Smoker    Packs/day: 0.50    Years: 33.00    Pack years: 16.50    Types: Cigarettes    Last attempt to quit: 02/01/2018    Years since quitting: 0.3  . Smokeless tobacco: Never Used  Substance and Sexual Activity  . Alcohol use: Yes    Alcohol/week: 7.0 standard drinks    Types: 7 Shots of liquor per week  . Drug use: Yes    Frequency: 1.0 times per week    Types: Marijuana    Comment: 10/30/2016 "weekly"  . Sexual activity: Yes  Lifestyle  . Physical activity:    Days per week: Not on file    Minutes per session: Not on file  . Stress: Not on file  Relationships  . Social connections:    Talks on phone: Not on file    Gets together: Not on file    Attends religious service: Not on file    Active member of club  or organization: Not on file    Attends meetings of clubs or organizations: Not on file    Relationship status: Not on file  . Intimate partner violence:    Fear of current or ex partner: Not on file    Emotionally abused: Not on file    Physically abused: Not on file    Forced sexual activity: Not on file  Other Topics Concern  . Not on file  Social History Narrative  . Not on file     Allergies  Allergen Reactions  . Nitrofurantoin Hives  . Other Other (See Comments)    Allergies mold, dust per allergy test  . Sulfonamide Derivatives Other (See Comments)    Unknown allergic reaction per husband      Outpatient Medications Prior to Visit  Medication Sig Dispense Refill  . albuterol (PROVENTIL,VENTOLIN) 90 MCG/ACT inhaler Inhale 2 puffs into the lungs every 6 (six) hours as needed for wheezing or shortness of breath. Reported on 01/02/2016    . cetirizine (ZYRTEC) 10 MG chewable tablet Chew 10 mg by mouth at bedtime.     .  fluticasone (FLONASE) 50 MCG/ACT nasal spray Place 1 spray into both nostrils daily.    . furosemide (LASIX) 20 MG tablet TAKE 1 TABLET BY MOUTH AS NEEDED FOR SWELLING. 30 tablet 0  . Multiple Vitamin (MULTIVITAMIN WITH MINERALS) TABS tablet Take 1 tablet by mouth daily. One a Day Women's    . vitamin B-12 1000 MCG tablet Take 1 tablet (1,000 mcg total) by mouth daily. 30 tablet 0  . nicotine (NICODERM CQ - DOSED IN MG/24 HOURS) 21 mg/24hr patch Place 1 patch (21 mg total) onto the skin daily. (Patient not taking: Reported on 06/15/2018) 28 patch 0   No facility-administered medications prior to visit.     Review of Systems  Constitutional: Negative for chills, fever, malaise/fatigue and weight loss.  HENT: Negative for hearing loss, sore throat and tinnitus.   Eyes: Negative for blurred vision and double vision.  Respiratory: Positive for shortness of breath. Negative for cough, hemoptysis, sputum production, wheezing and stridor.   Cardiovascular: Negative for chest pain, palpitations, orthopnea, leg swelling and PND.  Gastrointestinal: Negative for abdominal pain, constipation, diarrhea, heartburn, nausea and vomiting.  Genitourinary: Negative for dysuria, hematuria and urgency.  Musculoskeletal: Negative for joint pain and myalgias.  Skin: Negative for itching and rash.  Neurological: Negative for dizziness, tingling, weakness and headaches.  Endo/Heme/Allergies: Negative for environmental allergies. Does not bruise/bleed easily.  Psychiatric/Behavioral: Negative for depression. The patient is not nervous/anxious and does not have insomnia.   All other systems reviewed and are negative.    Objective:  Physical Exam  Constitutional: She is oriented to person, place, and time. She appears well-developed and well-nourished. No distress.  HENT:  Head: Normocephalic and atraumatic.  Mouth/Throat: Oropharynx is clear and moist.  Eyes: Pupils are equal, round, and reactive to light.  Conjunctivae are normal. No scleral icterus.  Neck: Neck supple. No JVD present. No tracheal deviation present.  Cardiovascular: Normal rate, regular rhythm, normal heart sounds and intact distal pulses.  No murmur heard. Pulmonary/Chest: Effort normal and breath sounds normal. No accessory muscle usage or stridor. No tachypnea. No respiratory distress. She has no wheezes. She has no rhonchi. She has no rales.  Abdominal: Soft. Bowel sounds are normal. She exhibits no distension. There is no tenderness.  Musculoskeletal: She exhibits no edema or tenderness.  Lymphadenopathy:    She has no cervical adenopathy.  Neurological: She  is alert and oriented to person, place, and time.  Skin: Skin is warm and dry. Capillary refill takes less than 2 seconds. No rash noted.  Psychiatric: She has a normal mood and affect. Her behavior is normal.  Vitals reviewed.   Vitals:   06/15/18 1005  BP: 120/78  Pulse: 91  SpO2: 97%  Weight: 136 lb (61.7 kg)  Height: 5\' 5"  (1.651 m)   97% on RA BMI Readings from Last 3 Encounters:  06/15/18 22.63 kg/m  03/29/18 20.25 kg/m  03/04/18 19.45 kg/m   Wt Readings from Last 3 Encounters:  06/15/18 136 lb (61.7 kg)  03/29/18 121 lb 11.2 oz (55.2 kg)  03/04/18 116 lb 14.4 oz (53 kg)     CBC    Component Value Date/Time   WBC 7.7 03/29/2018 1101   RBC 3.76 (L) 03/29/2018 1101   HGB 13.1 03/29/2018 1101   HCT 38.2 03/29/2018 1101   PLT 383.0 03/29/2018 1101   MCV 101.6 (H) 03/29/2018 1101   MCH 33.0 03/01/2018 0237   MCHC 34.3 03/29/2018 1101   RDW 15.4 03/29/2018 1101   LYMPHSABS 3.2 03/29/2018 1101   MONOABS 0.8 03/29/2018 1101   EOSABS 0.1 03/29/2018 1101   BASOSABS 0.0 03/29/2018 1101    Chest Imaging: 02/27/2018 CXR - BL intersitial markings The patient's images have been independently reviewed by me.    Pulmonary Functions Testing Results: No results found for: FEV1, FVC, FEV1FVC, TLC, DLCO  FeNO: None   Pathology: None    Echocardiogram: 02/2018 - Left ventricle: The cavity size was normal. Wall thickness was   normal. Systolic function was vigorous. The estimated ejection   fraction was in the range of 65% to 70%. Wall motion was normal;   there were no regional wall motion abnormalities. Doppler   parameters are consistent with abnormal left ventricular   relaxation (grade 1 diastolic dysfunction).  Heart Catheterization: None     Assessment & Plan:   Mild intermittent asthma without complication - Plan: Pulmonary Function Test  History of acute respiratory distress syndrome (ARDS)  Former smoker  History of alcohol abuse  Discussion: This young lady that had a recent hospitalization for pneumonia and ARDS comp gated by alcohol withdrawal back in April 2019.  She was recommended by her PCP as well as some of her friends to be seen by a pulmonologist prior to cold flu season.  She does have a history of mild intermittent asthma.  This has been well controlled with as needed albuterol use as well as control of her allergies.  She is currently only using her albuterol inhaler 1-2 times per week.  Recommend continued PRN use of her albuterol.  If her symptoms worsen and she continues to use this more than 2-3 times per day she is supposed to let us know if she has increased frequency of albuterol use.  She currently denies nocturnal symptoms or cough or dyspnea on exertion.  She has not had any frequent bouts of wheezing.  All of these symptoms have improved since she has quit smoking back in April after her hospitalization.  Given her history of ARDS as well as asthma will recommend evaluation of pulmonary function with full PFTs.  We will attempt to complete that's this week as she is nearing time of loss of her medical insurance.  Patient given refill of albuterol inhaler  Pending PFTs if there is evidence of COPD patient may qualify for Pneumovax.  Return to clinic as needed if symptoms  worsen or  within 1 year.   Current Outpatient Medications:  .  albuterol (PROVENTIL,VENTOLIN) 90 MCG/ACT inhaler, Inhale 2 puffs into the lungs every 6 (six) hours as needed for wheezing or shortness of breath. Reported on 01/02/2016, Disp: , Rfl:  .  cetirizine (ZYRTEC) 10 MG chewable tablet, Chew 10 mg by mouth at bedtime. , Disp: , Rfl:  .  fluticasone (FLONASE) 50 MCG/ACT nasal spray, Place 1 spray into both nostrils daily., Disp: , Rfl:  .  furosemide (LASIX) 20 MG tablet, TAKE 1 TABLET BY MOUTH AS NEEDED FOR SWELLING., Disp: 30 tablet, Rfl: 0 .  Multiple Vitamin (MULTIVITAMIN WITH MINERALS) TABS tablet, Take 1 tablet by mouth daily. One a Day Women's, Disp: , Rfl:  .  vitamin B-12 1000 MCG tablet, Take 1 tablet (1,000 mcg total) by mouth daily., Disp: 30 tablet, Rfl: 0 .  albuterol (PROVENTIL HFA;VENTOLIN HFA) 108 (90 Base) MCG/ACT inhaler, Inhale 2 puffs into the lungs every 6 (six) hours as needed for wheezing or shortness of breath., Disp: 1 Inhaler, Rfl: 6 .  nicotine (NICODERM CQ - DOSED IN MG/24 HOURS) 21 mg/24hr patch, Place 1 patch (21 mg total) onto the skin daily. (Patient not taking: Reported on 06/15/2018), Disp: 28 patch, Rfl: 0   Garner Nash, DO [facility] 06/15/2018 10:32 AM

## 2018-06-14 NOTE — Telephone Encounter (Signed)
Medication filled to pharmacy as requested.   

## 2018-06-15 ENCOUNTER — Encounter: Payer: Self-pay | Admitting: Pulmonary Disease

## 2018-06-15 ENCOUNTER — Ambulatory Visit: Payer: BLUE CROSS/BLUE SHIELD | Admitting: Pulmonary Disease

## 2018-06-15 VITALS — BP 120/78 | HR 91 | Ht 65.0 in | Wt 136.0 lb

## 2018-06-15 DIAGNOSIS — J4520 Mild intermittent asthma, uncomplicated: Secondary | ICD-10-CM

## 2018-06-15 DIAGNOSIS — Z87898 Personal history of other specified conditions: Secondary | ICD-10-CM | POA: Diagnosis not present

## 2018-06-15 DIAGNOSIS — F1011 Alcohol abuse, in remission: Secondary | ICD-10-CM

## 2018-06-15 DIAGNOSIS — Z8709 Personal history of other diseases of the respiratory system: Secondary | ICD-10-CM | POA: Diagnosis not present

## 2018-06-15 DIAGNOSIS — Z87891 Personal history of nicotine dependence: Secondary | ICD-10-CM

## 2018-06-15 DIAGNOSIS — J302 Other seasonal allergic rhinitis: Secondary | ICD-10-CM

## 2018-06-15 MED ORDER — ALBUTEROL SULFATE HFA 108 (90 BASE) MCG/ACT IN AERS: 2.0000 | INHALATION_SPRAY | Freq: Four times a day (QID) | RESPIRATORY_TRACT | 6 refills | Status: DC | PRN

## 2018-06-15 NOTE — Patient Instructions (Signed)
Albuterol inhaler refilled  Full PFTs to be completed Continue exercising and cessation from smoking.  RTC as needed or in 1 year

## 2018-06-16 ENCOUNTER — Ambulatory Visit (INDEPENDENT_AMBULATORY_CARE_PROVIDER_SITE_OTHER): Payer: BLUE CROSS/BLUE SHIELD | Admitting: Pulmonary Disease

## 2018-06-16 DIAGNOSIS — J4520 Mild intermittent asthma, uncomplicated: Secondary | ICD-10-CM

## 2018-06-16 LAB — PULMONARY FUNCTION TEST
DL/VA % pred: 76 %
DL/VA: 3.76 ml/min/mmHg/L
DLCO unc % pred: 63 %
DLCO unc: 16.35 ml/min/mmHg
FEF 25-75 Post: 2.8 L/sec
FEF 25-75 Pre: 2.41 L/sec
FEF2575-%Change-Post: 16 %
FEF2575-%Pred-Post: 96 %
FEF2575-%Pred-Pre: 83 %
FEV1-%Change-Post: 5 %
FEV1-%Pred-Post: 92 %
FEV1-%Pred-Pre: 87 %
FEV1-Post: 2.74 L
FEV1-Pre: 2.59 L
FEV1FVC-%Change-Post: 18 %
FEV1FVC-%Pred-Pre: 98 %
FEV6-%Change-Post: -10 %
FEV6-%Pred-Post: 80 %
FEV6-%Pred-Pre: 89 %
FEV6-Post: 2.92 L
FEV6-Pre: 3.26 L
FEV6FVC-%Pred-Post: 102 %
FEV6FVC-%Pred-Pre: 102 %
FVC-%Change-Post: -10 %
FVC-%Pred-Post: 78 %
FVC-%Pred-Pre: 87 %
FVC-Post: 2.92 L
FVC-Pre: 3.26 L
Post FEV1/FVC ratio: 94 %
Post FEV6/FVC ratio: 100 %
Pre FEV1/FVC ratio: 79 %
Pre FEV6/FVC Ratio: 100 %
RV % pred: 105 %
RV: 1.92 L
TLC % pred: 96 %
TLC: 5.06 L

## 2018-06-16 NOTE — Progress Notes (Signed)
PFT done today. 

## 2018-08-24 ENCOUNTER — Ambulatory Visit (INDEPENDENT_AMBULATORY_CARE_PROVIDER_SITE_OTHER): Payer: Self-pay | Admitting: Family Medicine

## 2018-08-24 ENCOUNTER — Encounter: Payer: Self-pay | Admitting: Family Medicine

## 2018-08-24 ENCOUNTER — Other Ambulatory Visit: Payer: Self-pay

## 2018-08-24 ENCOUNTER — Ambulatory Visit (INDEPENDENT_AMBULATORY_CARE_PROVIDER_SITE_OTHER): Payer: Self-pay

## 2018-08-24 VITALS — BP 124/86 | HR 82 | Temp 97.6°F | Ht 65.0 in | Wt 141.1 lb

## 2018-08-24 DIAGNOSIS — R059 Cough, unspecified: Secondary | ICD-10-CM

## 2018-08-24 DIAGNOSIS — R05 Cough: Secondary | ICD-10-CM

## 2018-08-24 NOTE — Progress Notes (Signed)
  Subjective:     Patient ID: Natalie Morrison, female   DOB: [birth year], 47 y.o.   MRN: [id number]  HPI Patient is seen with some persistent sinus congestion and cough over the past few weeks.  [facility] and started on Augmentin and is on last day of that and was also given prednisone.  She had complicated pneumonia last spring complicated by ARDS and alcohol withdrawal.  She is very anxious about her current cough because of that.  She feels some better after starting the antibiotics.  She has albuterol inhaler which she uses infrequently.  Denies any recent fever.  Cough mostly nonproductive.  She saw pulmonary in September and had pulmonary function test then which were normal.  Quit smoking last April  Past Medical History:  Diagnosis Date  . Alcohol abuse   . Anemia   . Anxiety   . Anxiety and depression   . Asthma   . Depression   . Hepatitis A    "when I was a kid"  . Hypertension    Past Surgical History:  Procedure Laterality Date  . BREAST LUMPECTOMY Right   . TONSILLECTOMY AND ADENOIDECTOMY Bilateral over 30 years ago    reports that she quit smoking about 6 months ago. Her smoking use included cigarettes. She has a 16.50 pack-year smoking history. She has never used smokeless tobacco. She reports that she drinks about 7.0 standard drinks of alcohol per week. She reports that she has current or past drug history. Drug: Marijuana. Frequency: 1.00 time per week. family history includes Heart disease in her father and mother; Liver disease in her mother. Allergies  Allergen Reactions  . Nitrofurantoin Hives  . Other Other (See Comments)    Allergies mold, dust per allergy test  . Sulfonamide Derivatives Other (See Comments)    Unknown allergic reaction per husband      Review of Systems  Constitutional: Negative for chills and fever.  HENT: Positive for congestion and sinus pressure.   Respiratory: Positive for cough. Negative for  shortness of breath and wheezing.   Cardiovascular: Negative for chest pain.  Gastrointestinal: Negative for nausea and vomiting.       Objective:   Physical Exam  Constitutional: She appears well-developed and well-nourished.  HENT:  Mouth/Throat: Oropharynx is clear and moist.  Cardiovascular: Normal rate and regular rhythm.  Pulmonary/Chest: Effort normal and breath sounds normal. She has no wheezes. She has no rales.       Assessment:     Persistent cough.  Question of recent acute sinusitis.  Has history of complicated pneumonia.  No respiratory distress at this time and nonfocal exam    Plan:     -obtain chest x-ray to further assess (to be over-read).  No acute changes. No effusion. -She will finish out antibiotics and follow-up promptly for any fever or increased shortness of breath or if cough not resolving over the next couple weeks. -Encouraged to stay well-hydrated  Eulas Post MD [facility] at [facility]

## 2018-08-24 NOTE — Patient Instructions (Signed)
Follow up for any fever, increased shortness of breath, or any persistent cough

## 2018-11-03 IMAGING — RF DG SWALLOWING FUNCTION - NRPT [facility]
1 series · 18 of 24 positions shown · non-contrast
Comparison: none

[Series 1: run · 27 acquisitions, 18 frames shown]
[im 1/27]
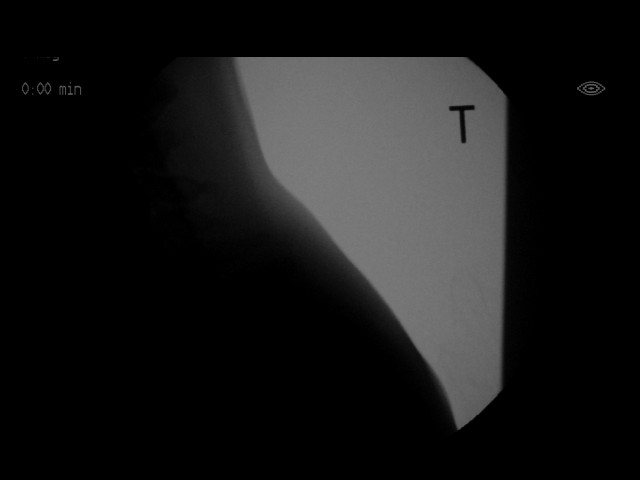
[im 3/27]
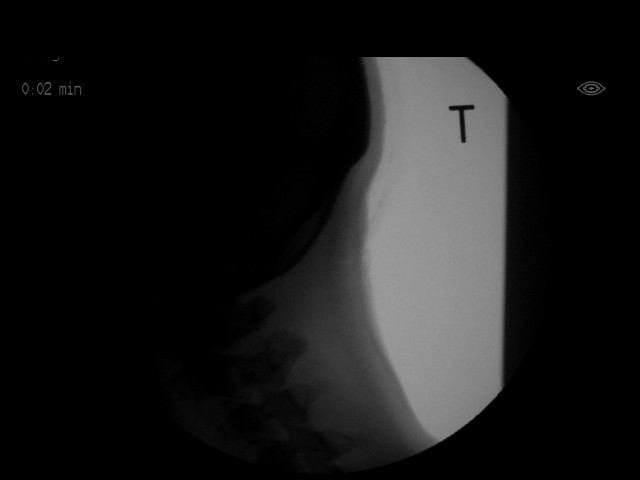
[im 4/27]
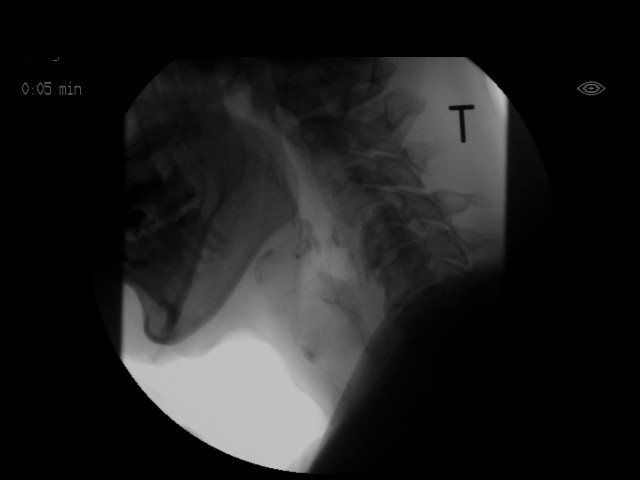
[im 5/27]
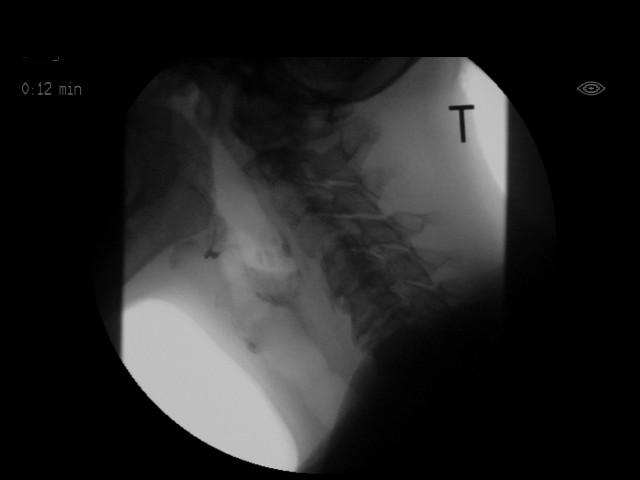
[im 7/27]
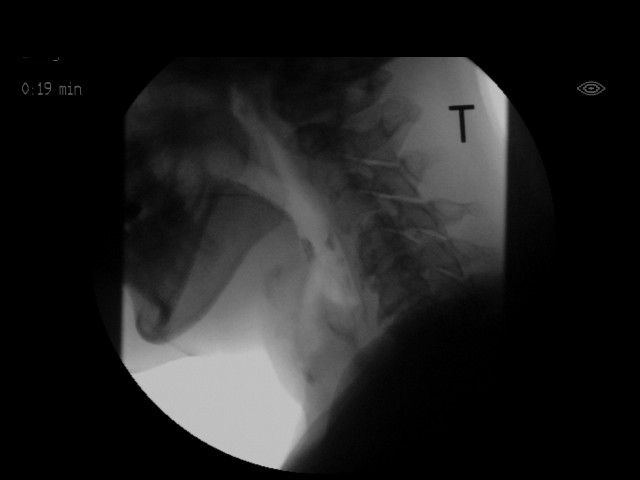
[im 8/27]
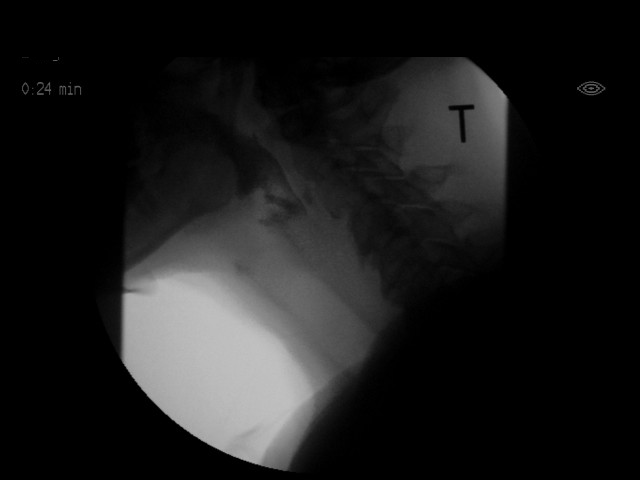
[im 10/27]
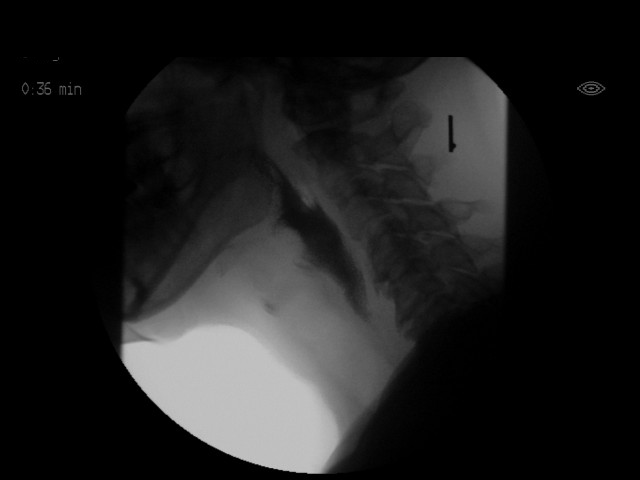
[im 12/27]
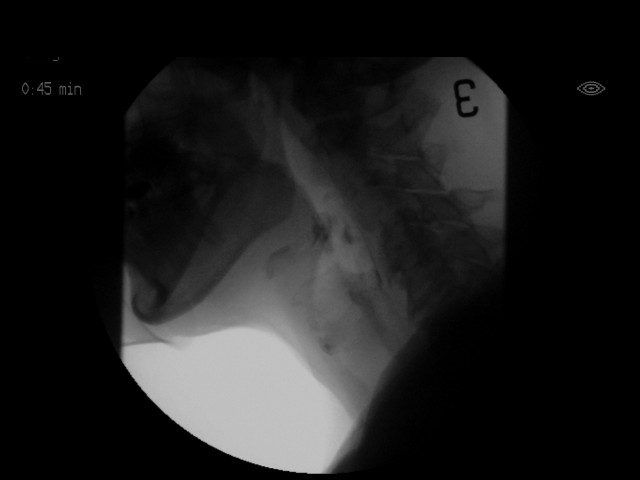
[im 13/27]
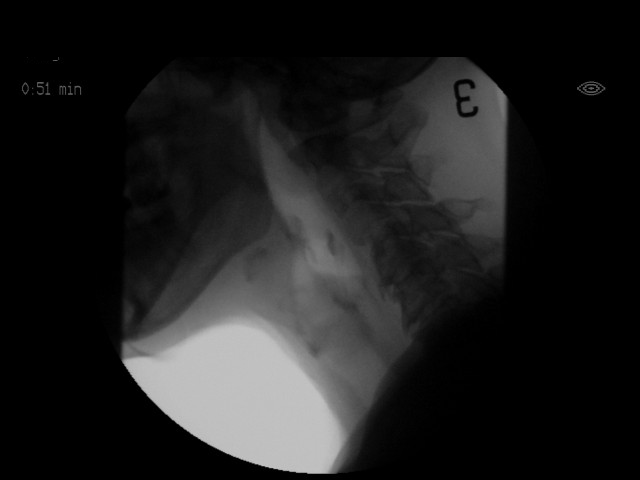
[im 14/27]
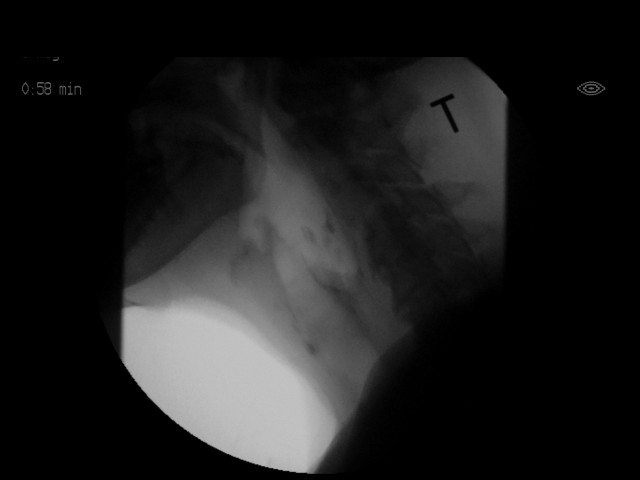
[im 16/27]
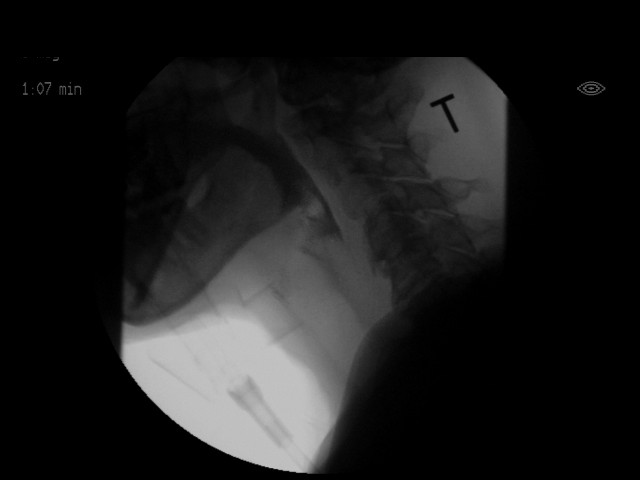
[im 17/27]
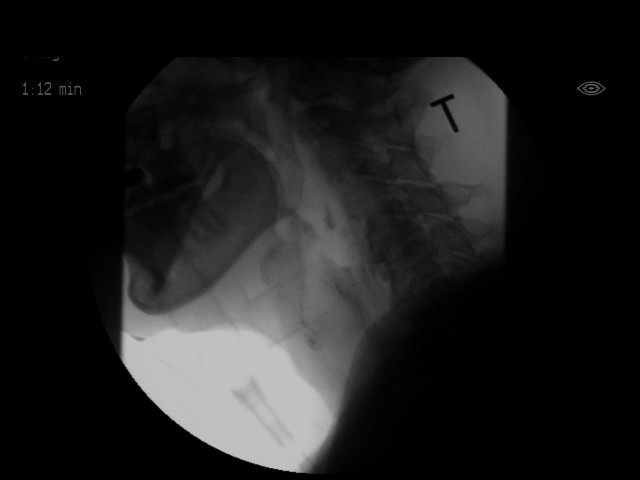
[im 19/27]
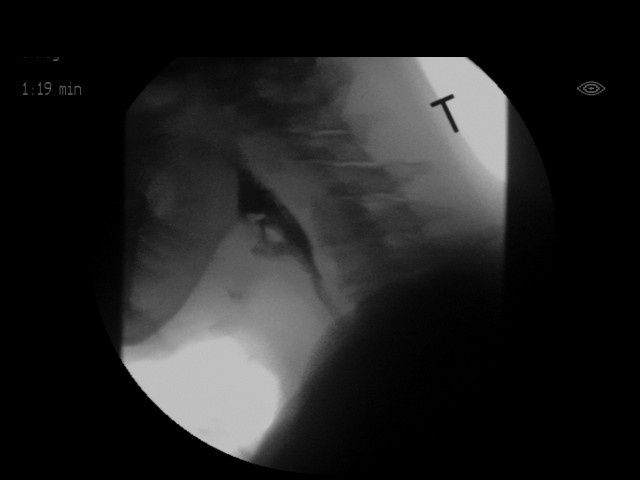
[im 21/27]
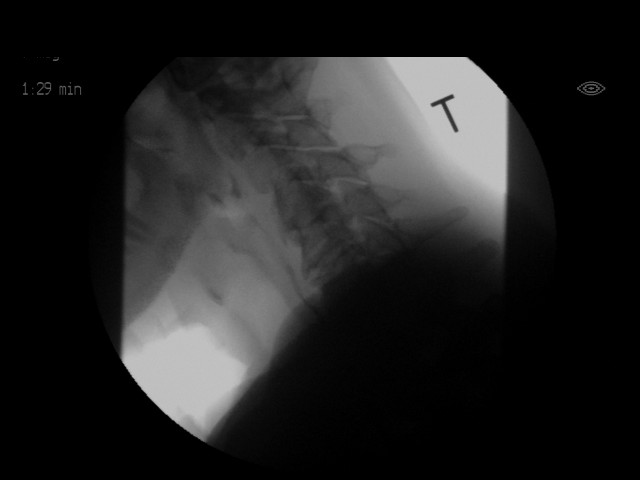
[im 22/27]
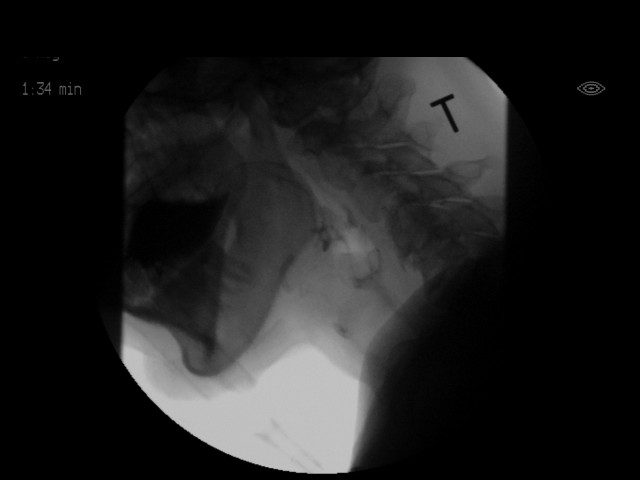
[im 23/27]
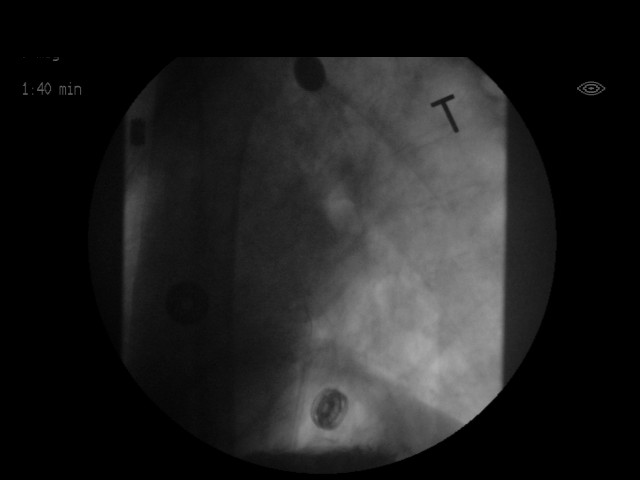
[im 25/27]
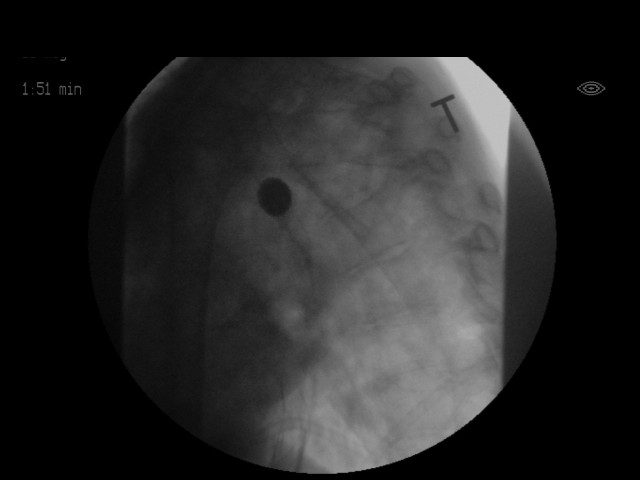
[im 27/27]
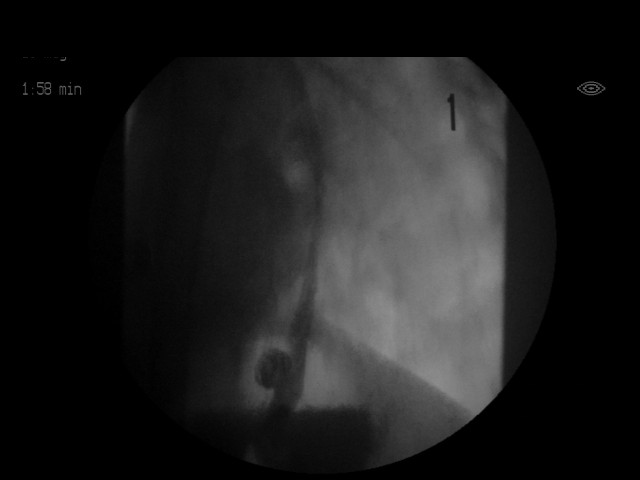

[18 of 24 positions shown; findings below may reference images not displayed]

FLUOROSCOPY FOR SWALLOWING FUNCTION STUDY:
Fluoroscopy was provided for swallowing function study, which was administered by a speech pathologist.  Final results and recommendations from this study are contained within the speech pathology report.

## 2018-11-06 IMAGING — DX DG CHEST 1V PORT
1 series · 1 of 1 positions shown · non-contrast
Comparison: 02/24/2018

CLINICAL DATA: Encounter for ARDS.

EXAM:
PORTABLE CHEST 1 VIEW

[chest]
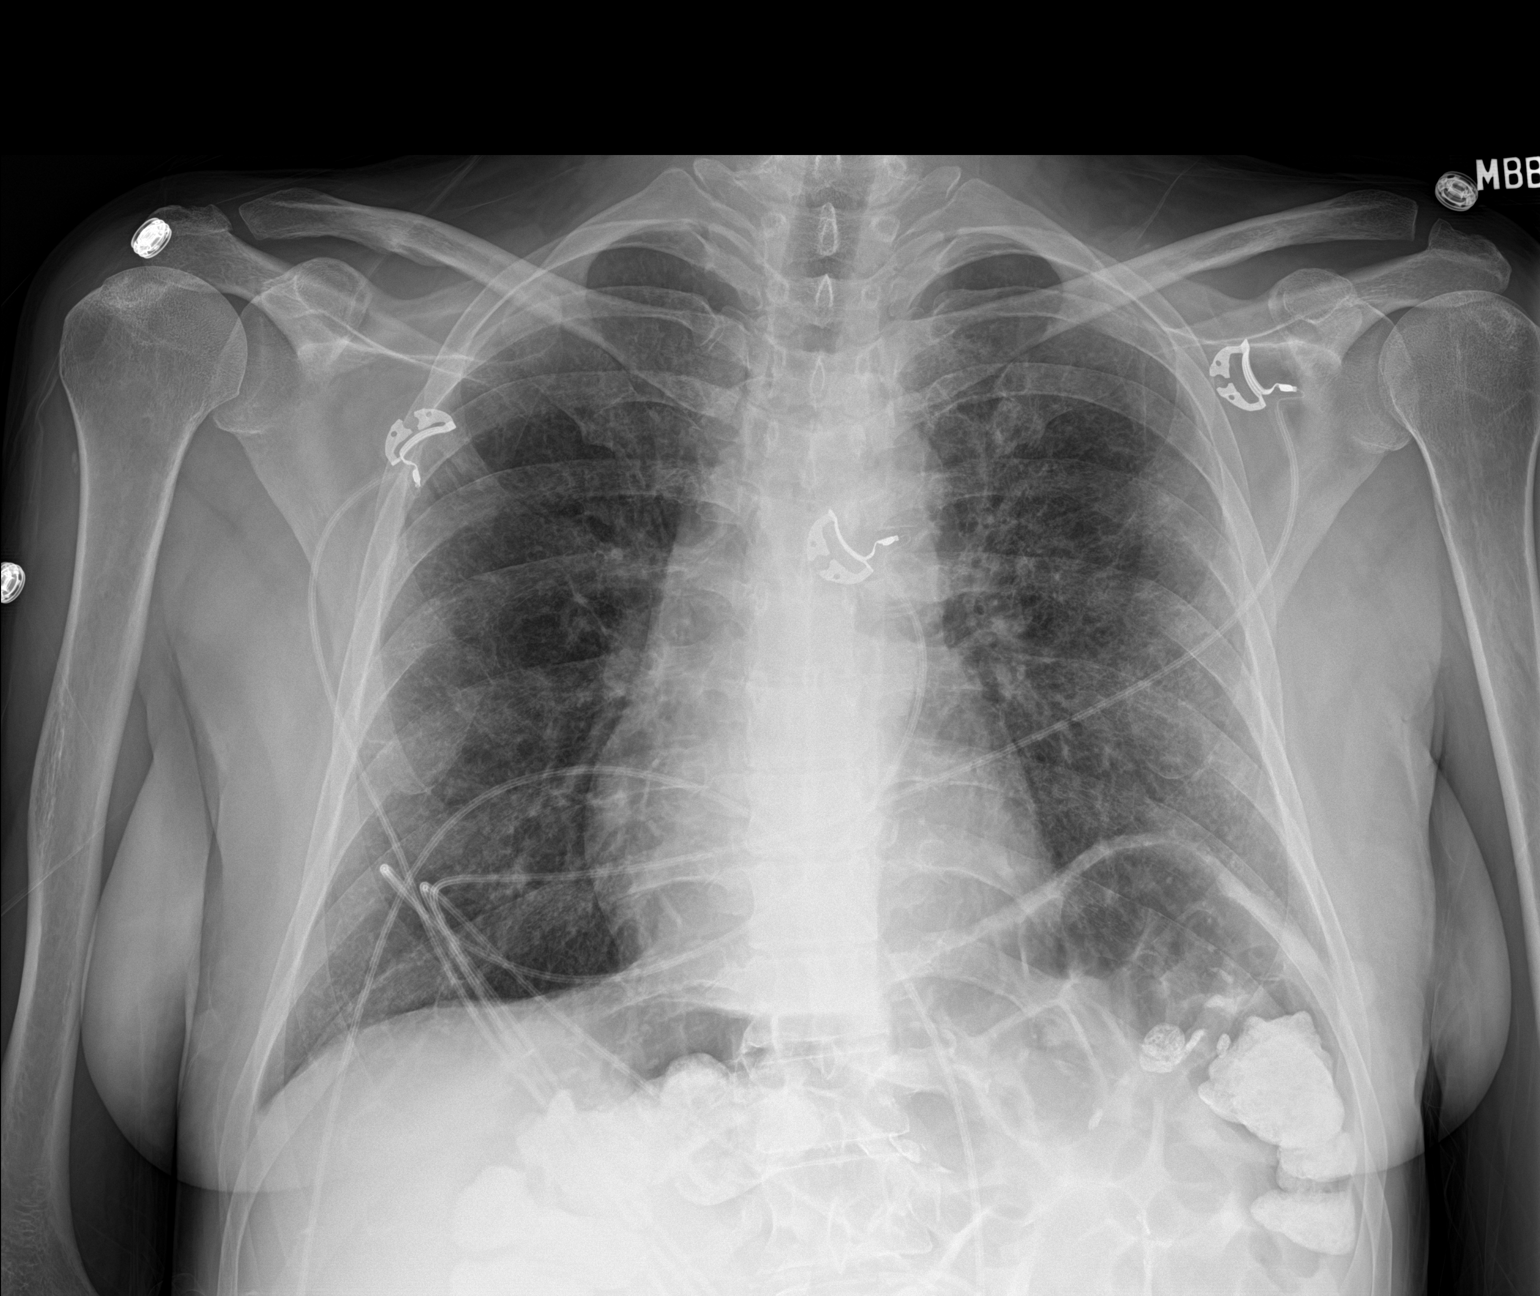

[1 of 1 positions shown; findings below may reference images not displayed]

FINDINGS: Normal heart size. There is no pleural effusion. Interval
improvement in pulmonary edema pattern. No new findings.
IMPRESSION: 1. Interval improvement in pulmonary edema.

## 2019-12-21 ENCOUNTER — Ambulatory Visit: Payer: Self-pay | Attending: Internal Medicine

## 2019-12-21 DIAGNOSIS — Z23 Encounter for immunization: Secondary | ICD-10-CM

## 2019-12-21 NOTE — Progress Notes (Signed)
   Covid-19 Vaccination Clinic  Name:  Natalie Morrison    MRN: [id number] DOB: 1971/05/27  12/21/2019  [name] was observed post Covid-19 immunization for 15 minutes without incident. She was provided with Vaccine Information Sheet and instruction to access the V-Safe system.   [name] was instructed to call 911 with any severe reactions post vaccine: Marland Kitchen Difficulty breathing  . Swelling of face and throat  . A fast heartbeat  . A bad rash all over body  . Dizziness and weakness   Immunizations Administered    Name Date Dose VIS Date Route   Pfizer COVID-19 Vaccine 12/21/2019 12:28 PM 0.3 mL 09/15/2019 Intramuscular   Manufacturer: [place]   Lot: [id number]   [place]: [id number]

## 2019-12-25 ENCOUNTER — Other Ambulatory Visit: Payer: Self-pay

## 2019-12-26 ENCOUNTER — Ambulatory Visit (INDEPENDENT_AMBULATORY_CARE_PROVIDER_SITE_OTHER): Payer: Self-pay | Admitting: Family Medicine

## 2019-12-26 ENCOUNTER — Encounter: Payer: Self-pay | Admitting: Family Medicine

## 2019-12-26 VITALS — BP 120/72 | HR 96 | Temp 97.7°F | Wt 153.1 lb

## 2019-12-26 DIAGNOSIS — F1010 Alcohol abuse, uncomplicated: Secondary | ICD-10-CM

## 2019-12-26 DIAGNOSIS — K219 Gastro-esophageal reflux disease without esophagitis: Secondary | ICD-10-CM

## 2019-12-26 MED ORDER — PANTOPRAZOLE SODIUM 40 MG PO TBEC: 40.0000 mg | DELAYED_RELEASE_TABLET | Freq: Every day | ORAL | 3 refills | Status: DC

## 2019-12-26 NOTE — Progress Notes (Signed)
Subjective:     Patient ID: Natalie Morrison, female   DOB: 05-14-1971, 49 y.o.   MRN: [id number]  HPI Darly has longstanding history of alcohol abuse, past history of hypertension, asthma, past history of acute pancreatitis, former smoker.  She is seen with increased GERD symptoms for at least the past couple months.  She is actually had some weight gain since last year.  She thinks she is post menopause.  She has frequent burning sensation epigastric up toward the sternum area.  Worse with foods like pizza or spicy foods.  She denies any dysphagia.  She tried some vinegar but this if anything made her symptoms worse.  She took Pepto-Bismol and Tums without relief.  No nausea or vomiting.  No melena.  No nonsteroidal use.  Still drinks about 2-3 alcoholic beverages per day.  She has had prior history of hypertension but currently blood pressure stable off medications.  She is currently not taking any regular medications.  Past Medical History:  Diagnosis Date  . Alcohol abuse   . Anemia   . Anxiety   . Anxiety and depression   . Asthma   . Depression   . Hepatitis A    "when I was a kid"  . Hypertension    Past Surgical History:  Procedure Laterality Date  . BREAST LUMPECTOMY Right   . TONSILLECTOMY AND ADENOIDECTOMY Bilateral over 30 years ago    reports that she quit smoking about 22 months ago. Her smoking use included cigarettes. She has a 16.50 pack-year smoking history. She has never used smokeless tobacco. She reports current alcohol use of about 7.0 standard drinks of alcohol per week. She reports current drug use. Frequency: 1.00 time per week. Drug: Marijuana. family history includes Heart disease in her father and mother; Liver disease in her mother. Allergies  Allergen Reactions  . Nitrofurantoin Hives  . Other Other (See Comments)    Allergies mold, dust per allergy test  . Sulfonamide Derivatives Other (See Comments)    Unknown allergic reaction per husband       Review of Systems  Constitutional: Negative for appetite change, fever and unexpected weight change.  Respiratory: Negative for cough and shortness of breath.   Cardiovascular: Negative for chest pain.  Gastrointestinal: Negative for abdominal pain, blood in stool, nausea and vomiting.  Neurological: Negative for dizziness.       Objective:   Physical Exam Vitals reviewed.  Constitutional:      Appearance: Normal appearance.  Cardiovascular:     Rate and Rhythm: Normal rate and regular rhythm.  Pulmonary:     Effort: Pulmonary effort is normal.     Breath sounds: Normal breath sounds.  Abdominal:     Palpations: Abdomen is soft. There is no mass.     Tenderness: There is no abdominal tenderness. There is no guarding or rebound.  Neurological:     Mental Status: She is alert.        Assessment:     #1 GERD.  Patient describes several months of GERD symptoms.  She has not had any loss of appetite or any weight loss.  She had some recent weight gain over the past couple years which may be contributing  #2 history of hypertension but currently stable off medication  #3 longstanding history of alcohol abuse    Plan:     -Recommend dietary modification with handout given -We recommend she reduce alcohol consumption -Start Protonix 40 mg daily and after about 6 weeks  if symptoms resolve then transition to Pepcid 20 mg twice daily -We have instructed her to notify us in 2 weeks of her symptoms or not greatly improved on the Protonix -Recommend complete physical with labs at some point this year  Eulas Post MD [facility] at [facility]

## 2019-12-26 NOTE — Patient Instructions (Signed)
Food Choices for Gastroesophageal Reflux Disease, Adult When you have gastroesophageal reflux disease (GERD), the foods you eat and your eating habits are very important. Choosing the right foods can help ease the discomfort of GERD. Consider working with a diet and nutrition specialist (dietitian) to help you make healthy food choices. What general guidelines should I follow?  Eating plan  Choose healthy foods low in fat, such as fruits, vegetables, whole grains, low-fat dairy products, and lean meat, fish, and poultry.  Eat frequent, small meals instead of three large meals each day. Eat your meals slowly, in a relaxed setting. Avoid bending over or lying down until 2-3 hours after eating.  Limit high-fat foods such as fatty meats or fried foods.  Limit your intake of oils, butter, and shortening to less than 8 teaspoons each day.  Avoid the following: ? Foods that cause symptoms. These may be different for different people. Keep a food diary to keep track of foods that cause symptoms. ? Alcohol. ? Drinking large amounts of liquid with meals. ? Eating meals during the 2-3 hours before bed.  Cook foods using methods other than frying. This may include baking, grilling, or broiling. Lifestyle  Maintain a healthy weight. Ask your health care provider what weight is healthy for you. If you need to lose weight, work with your health care provider to do so safely.  Exercise for at least 30 minutes on 5 or more days each week, or as told by your health care provider.  Avoid wearing clothes that fit tightly around your waist and chest.  Do not use any products that contain nicotine or tobacco, such as cigarettes and e-cigarettes. If you need help quitting, ask your health care provider.  Sleep with the head of your bed raised. Use a wedge under the mattress or blocks under the bed frame to raise the head of the bed. What foods are not recommended? The items listed may not be a complete  list. Talk with your dietitian about what dietary choices are best for you. Grains Pastries or quick breads with added fat. French toast. Vegetables Deep fried vegetables. French fries. Any vegetables prepared with added fat. Any vegetables that cause symptoms. For some people this may include tomatoes and tomato products, chili peppers, onions and garlic, and horseradish. Fruits Any fruits prepared with added fat. Any fruits that cause symptoms. For some people this may include citrus fruits, such as oranges, grapefruit, pineapple, and lemons. Meats and other protein foods High-fat meats, such as fatty beef or pork, hot dogs, ribs, ham, sausage, salami and bacon. Fried meat or protein, including fried fish and fried chicken. Nuts and nut butters. Dairy Whole milk and chocolate milk. Sour cream. Cream. Ice cream. Cream cheese. Milk shakes. Beverages Coffee and tea, with or without caffeine. Carbonated beverages. Sodas. Energy drinks. Fruit juice made with acidic fruits (such as orange or grapefruit). Tomato juice. Alcoholic drinks. Fats and oils Butter. Margarine. Shortening. Ghee. Sweets and desserts Chocolate and cocoa. Donuts. Seasoning and other foods Pepper. Peppermint and spearmint. Any condiments, herbs, or seasonings that cause symptoms. For some people, this may include curry, hot sauce, or vinegar-based salad dressings. Summary  When you have gastroesophageal reflux disease (GERD), food and lifestyle choices are very important to help ease the discomfort of GERD.  Eat frequent, small meals instead of three large meals each day. Eat your meals slowly, in a relaxed setting. Avoid bending over or lying down until 2-3 hours after eating.  Limit high-fat   foods such as fatty meat or fried foods. This information is not intended to replace advice given to you by your health care provider. Make sure you discuss any questions you have with your health care provider. Document Revised:  01/12/2019 Document Reviewed: 09/22/2016 Elsevier Patient Education  [place].  Take the Protonix 40 mg daily.  After 4 to 6 weeks try tapering off the Protonix to Pepcid 20 mg twice daily  Let me know if not better in 2 weeks with the Protonix.

## 2020-01-15 ENCOUNTER — Ambulatory Visit: Payer: Self-pay | Attending: Internal Medicine

## 2020-01-15 DIAGNOSIS — Z23 Encounter for immunization: Secondary | ICD-10-CM

## 2020-01-15 NOTE — Progress Notes (Signed)
   Covid-19 Vaccination Clinic  Name:  Natalie Morrison    MRN: [id number] DOB: 1971/01/28  01/15/2020  [name] was observed post Covid-19 immunization for 15 minutes without incident. She was provided with Vaccine Information Sheet and instruction to access the V-Safe system.   [name] was instructed to call 911 with any severe reactions post vaccine: Marland Kitchen Difficulty breathing  . Swelling of face and throat  . A fast heartbeat  . A bad rash all over body  . Dizziness and weakness   Immunizations Administered    Name Date Dose VIS Date Route   Pfizer COVID-19 Vaccine 01/15/2020 11:20 AM 0.3 mL 09/15/2019 Intramuscular   Manufacturer: [place]   Lot: [id number]   [place]: [id number]

## 2020-01-17 ENCOUNTER — Telehealth: Payer: Self-pay | Admitting: Gastroenterology

## 2020-01-17 ENCOUNTER — Telehealth: Payer: Self-pay | Admitting: Family Medicine

## 2020-01-17 ENCOUNTER — Encounter: Payer: Self-pay | Admitting: Physician Assistant

## 2020-01-17 ENCOUNTER — Other Ambulatory Visit: Payer: Self-pay

## 2020-01-17 DIAGNOSIS — K219 Gastro-esophageal reflux disease without esophagitis: Secondary | ICD-10-CM

## 2020-01-17 NOTE — Telephone Encounter (Signed)
Spoke with patient she wants to proceed with referral. Referral placed

## 2020-01-17 NOTE — Telephone Encounter (Signed)
Pt states three weeks ago her PCP treated her for heartburn and it has not gotten any better. She is wondering if she needs to follow up with him or if he will refer her to a specialist?   Pt can be reached at [phone] -ok to leave detailed message per pt

## 2020-01-17 NOTE — Telephone Encounter (Signed)
Please advise 

## 2020-01-17 NOTE — Telephone Encounter (Signed)
If not better on the Protonix, I would suggest setting up GI referral.  She may need further evaluation (eg EGD)

## 2020-01-18 NOTE — Telephone Encounter (Signed)
Pt called and she states her PCP put her on protonix daily. Reports this is not working. Pt is taking it in the evening prior to her largest meal. Discussed with pt that she should take the protonix 30 min prior to her first meal of the day. Pt will try this and call us back if she is still having problems.

## 2020-01-18 NOTE — Telephone Encounter (Signed)
Left message for pt to call back  °

## 2020-01-31 ENCOUNTER — Other Ambulatory Visit (INDEPENDENT_AMBULATORY_CARE_PROVIDER_SITE_OTHER): Payer: Self-pay

## 2020-01-31 ENCOUNTER — Ambulatory Visit: Payer: Self-pay | Admitting: Physician Assistant

## 2020-01-31 ENCOUNTER — Encounter: Payer: Self-pay | Admitting: Physician Assistant

## 2020-01-31 VITALS — BP 130/80 | HR 81 | Temp 98.5°F | Ht 65.0 in | Wt 143.0 lb

## 2020-01-31 DIAGNOSIS — R634 Abnormal weight loss: Secondary | ICD-10-CM

## 2020-01-31 DIAGNOSIS — R1310 Dysphagia, unspecified: Secondary | ICD-10-CM

## 2020-01-31 LAB — COMPREHENSIVE METABOLIC PANEL
ALT: 72 U/L — ABNORMAL HIGH (ref 0–35)
AST: 57 U/L — ABNORMAL HIGH (ref 0–37)
Albumin: 4.4 g/dL (ref 3.5–5.2)
Alkaline Phosphatase: 67 U/L (ref 39–117)
BUN: 15 mg/dL (ref 6–23)
CO2: 23 mEq/L (ref 19–32)
Calcium: 10.3 mg/dL (ref 8.4–10.5)
Chloride: 97 mEq/L (ref 96–112)
Creatinine, Ser: 0.88 mg/dL (ref 0.40–1.20)
GFR: 68.01 mL/min (ref >60.00)
Glucose, Bld: 95 mg/dL (ref 70–99)
Potassium: 4.9 mEq/L (ref 3.5–5.1)
Sodium: 137 mEq/L (ref 135–145)
Total Bilirubin: 0.9 mg/dL (ref 0.2–1.2)
Total Protein: 7.7 g/dL (ref 6.0–8.3)

## 2020-01-31 MED ORDER — PANTOPRAZOLE SODIUM 40 MG PO TBEC: 40.0000 mg | DELAYED_RELEASE_TABLET | Freq: Every day | ORAL | 6 refills | Status: DC

## 2020-01-31 NOTE — Progress Notes (Signed)
Subjective:    Patient ID: Natalie Morrison, female    DOB: June 15, 1971, 49 y.o.   MRN: [id number]  HPI Natalie Morrison is a pleasant 49 year old white female, established with Dr. Silverio Decamp who comes in today with complaints of progressive reflux, dysphagia and odynophagia. She was last seen in 2017 when she had upper endoscopy which showed a normal esophagus, she was noted to have mild prepyloric erythema and gastric biopsies showed no evidence of H. pylori. Patient has history of hypertension, asthma, prior pneumonia with sepsis and respiratory failure, prior episode of pancreatitis related to EtOH abuse.,  Chronic EtOH use, and depression. She says her current symptoms have been present over the past year.  She had initially tried some Tums and Pepto-Bismol.  Over the past couple of months she has had significant progression in symptoms and says it has been "getting bad".  She was seen by her PCP Dr. Elease Hashimoto and started on Protonix 40 mg p.o. every morning which has made little difference.  She has also been taking over-the-counter Pepcid once or twice daily later in the day.  What she is describing is a sensation of "catching" or food sticking when she is swallowing.  This has been happening primarily with solid foods and she does okay with liquids.  She has gotten to the point over the past week or so that she stopped eating solid food.  She has had a 5 to 7 pound weight loss.  She also gets discomfort with the food lodging.  She says it is very uncomfortable until the food passes or she regurgitates.  She is having frequent episodes requiring regurgitation.  No nausea.  No fever or chills.  No complaints of abdominal pain. Patient stopped smoking a few years ago.  She endorses continued EtOH, she says she is currently putting a shot in her water bottle and trying to keep herself hydrated because she has been eating and drinking significantly less.  Review of Systems Pertinent positive and negative review of  systems were noted in the above HPI section.  All other review of systems was otherwise negative.  Outpatient Encounter Medications as of 01/31/2020  Medication Sig  . cetirizine (ZYRTEC) 10 MG chewable tablet Chew 10 mg by mouth at bedtime.   Marland Kitchen EPINEPHrine (PRIMATENE MIST IN) Inhale 1 puff into the lungs daily as needed.  . fluticasone (FLONASE) 50 MCG/ACT nasal spray Place 1 spray into both nostrils daily.  . Multiple Vitamin (MULTIVITAMIN WITH MINERALS) TABS tablet Take 1 tablet by mouth daily. One a Day Women's  . pantoprazole (PROTONIX) 40 MG tablet Take 1 tablet (40 mg total) by mouth daily.  . vitamin B-12 1000 MCG tablet Take 1 tablet (1,000 mcg total) by mouth daily.  . [DISCONTINUED] pantoprazole (PROTONIX) 40 MG tablet Take 1 tablet (40 mg total) by mouth daily.  . [DISCONTINUED] albuterol (PROVENTIL HFA;VENTOLIN HFA) 108 (90 Base) MCG/ACT inhaler Inhale 2 puffs into the lungs every 6 (six) hours as needed for wheezing or shortness of breath.  . [DISCONTINUED] albuterol (PROVENTIL,VENTOLIN) 90 MCG/ACT inhaler Inhale 2 puffs into the lungs every 6 (six) hours as needed for wheezing or shortness of breath. Reported on 01/02/2016  . [DISCONTINUED] furosemide (LASIX) 20 MG tablet TAKE 1 TABLET BY MOUTH AS NEEDED FOR SWELLING.   No facility-administered encounter medications on file as of 01/31/2020.   Allergies  Allergen Reactions  . Nitrofurantoin Hives  . Other Other (See Comments)    Allergies mold, dust per allergy test  .  Sulfonamide Derivatives Other (See Comments)    Unknown allergic reaction per husband    Patient Active Problem List   Diagnosis Date Noted  . Sepsis ([place]) 02/22/2018  . Pressure injury of skin 02/22/2018  . Airway intubation performed without difficulty   . Sepsis due to pneumonia ([place]) 01/27/2018  . Acute respiratory failure with hypoxia ([place]) 01/27/2018  . Hypokalemia 01/27/2018  . Prolonged QT interval 01/27/2018  . Acute respiratory failure with  hypoxia and hypercapnia ([place]) 01/27/2018  . Hypomagnesemia 01/27/2018  . Community acquired pneumonia   . Hyponatremia 06/23/2017  . Pancreatitis 10/30/2016  . Acute pancreatitis 10/30/2016  . Tobacco abuse 10/30/2016  . Moderate malnutrition ([place]) 12/03/2015  . Hypotension 09/23/2015  . Acute kidney injury ([place]) 09/23/2015  . Septic shock ([place]) 09/21/2015  . History of depression 06/14/2015  . Asthma exacerbation 04/07/2011  . HEMATURIA UNSPECIFIED 07/25/2009  . Alcohol abuse 06/26/2009  . ALLERGIC RHINITIS 06/26/2009  . Fatigue 06/26/2009  . Essential hypertension 06/28/2007   Social History   Socioeconomic History  . Marital status: Married    Spouse name: Not on file  . Number of children: Not on file  . Years of education: Not on file  . Highest education level: Not on file  Occupational History  . Not on file  Tobacco Use  . Smoking status: Former Smoker    Packs/day: 0.50    Years: 33.00    Pack years: 16.50    Types: Cigarettes    Quit date: 02/01/2018    Years since quitting: 1.9  . Smokeless tobacco: Never Used  Substance and Sexual Activity  . Alcohol use: Yes    Comment: 2-3 drinks per day  . Drug use: Yes    Frequency: 1.0 times per week    Types: Marijuana    Comment: 10/30/2016 "weekly"  . Sexual activity: Yes  Other Topics Concern  . Not on file  Social History Narrative  . Not on file   Social Determinants of Health   Financial Resource Strain:   . Difficulty of Paying Living Expenses:   Food Insecurity:   . Worried About Charity fundraiser in the Last Year:   . Arboriculturist in the Last Year:   Transportation Needs:   . Film/video editor (Medical):   Marland Kitchen Lack of Transportation (Non-Medical):   Physical Activity:   . Days of Exercise per Week:   . Minutes of Exercise per Session:   Stress:   . Feeling of Stress :   Social Connections:   . Frequency of Communication with Friends and Family:   . Frequency of Social Gatherings with  Friends and Family:   . Attends Religious Services:   . Active Member of Clubs or Organizations:   . Attends Archivist Meetings:   Marland Kitchen Marital Status:   Intimate Partner Violence:   . Fear of Current or Ex-Partner:   . Emotionally Abused:   Marland Kitchen Physically Abused:   . Sexually Abused:     Natalie Morrison family history includes Heart disease in her father and mother; Liver disease in her mother.      Objective:    Vitals:   01/31/20 1427  BP: 130/80  Pulse: 81  Temp: 98.5 F (36.9 C)    Physical Exam Well-developed well-nourished white female in no acute distress.  Height, Weight, 143 BMI 23.8  HEENT; nontraumatic normocephalic, EOMI, PER R LA, sclera anicteric. Oropharynx; not examined today Neck; supple, no JVD Cardiovascular; regular  rate and rhythm with S1-S2, no murmur rub or gallop Pulmonary; Clear bilaterally Abdomen; soft, nontender, nondistended, no palpable mass or hepatosplenomegaly, no palpable fluid wave, bowel sounds are active Rectal; not done today Skin; benign exam, no jaundice rash or appreciable lesions Extremities; no clubbing cyanosis or edema skin warm and dry Neuro/Psych; alert and oriented x4, grossly nonfocal mood and affect appropriate       Assessment & Plan:    #44   49 year old white female with 1 year history of dysphagia and odynophagia with significant progression over the past couple of months.  Patient is now to the point that she has stopped eating solid food.  She describes the episodes of odynophagia as very painful.  She is having regular episodes requiring regurgitation.  Rule out peptic esophageal stricture, rule out neoplasm, rule out motility disorder  #2 history of hypertension #3.  History of asthma #4.  Regular EtOH use #5 prior history of pancreatitis  Plan; patient will be scheduled for upper endoscopy with possible esophageal dilation and/or biopsies with Dr. Silverio Decamp  .  Procedure was discussed in detail with the  patient including indications risks and benefits and she is agreeable to proceed.  Procedure has been scheduled for next week. Patient has completed COVID-19 vaccination. For now continue Protonix 40 mg p.o. every morning and she was advised okay to take over-the-counter Pepcid once or twice daily as needed Check CBC with differential and C-Met today Advised very soft diet with avoidance altogether of meat and breads until endoscopy is done.  Aundra Espin S Preston Garabedian PA-C 01/31/2020   Cc: Eulas Post, MD

## 2020-01-31 NOTE — Patient Instructions (Signed)
If you are age 50 or older, your body mass index should be between 23-30. Your Body mass index is 23.8 kg/m. If this is out of the aforementioned range listed, please consider follow up with your Primary Care Provider.  If you are age 32 or younger, your body mass index should be between 19-25. Your Body mass index is 23.8 kg/m. If this is out of the aformentioned range listed, please consider follow up with your Primary Care Provider.   You have been scheduled for an endoscopy. Please follow written instructions given to you at your visit today. If you use inhalers (even only as needed), please bring them with you on the day of your procedure.  Continue Pantoprazole 40 mg 1 capsule daily in the morning before breakfast. Refills have been sent to your pharmacy.  You can take Pepcid 20 mg 1 tablet as needed. This can be picked up over the counter.  Avoid meats and bread. Do a very soft diet.

## 2020-02-01 LAB — CBC WITH DIFFERENTIAL/PLATELET
Basophils Absolute: 0.1 10*3/uL (ref 0.0–0.1)
Basophils Relative: 1.1 % (ref 0.0–3.0)
Eosinophils Absolute: 0.2 10*3/uL (ref 0.0–0.7)
Eosinophils Relative: 2.3 % (ref 0.0–5.0)
HCT: 41.4 % (ref 36.0–46.0)
Hemoglobin: 14.3 g/dL (ref 12.0–15.0)
Lymphocytes Relative: 27.8 % (ref 12.0–46.0)
Lymphs Abs: 2.8 10*3/uL (ref 0.7–4.0)
MCHC: 34.5 g/dL (ref 30.0–36.0)
MCV: 105.8 fl — ABNORMAL HIGH (ref 78.0–100.0)
Monocytes Absolute: 1.2 10*3/uL — ABNORMAL HIGH (ref 0.1–1.0)
Monocytes Relative: 12.2 % — ABNORMAL HIGH (ref 3.0–12.0)
Neutro Abs: 5.8 10*3/uL (ref 1.4–7.7)
Neutrophils Relative %: 56.6 % (ref 43.0–77.0)
Platelets: 235 10*3/uL (ref 150.0–400.0)
RBC: 3.91 Mil/uL (ref 3.87–5.11)
RDW: 13.3 % (ref 11.5–15.5)
WBC: 10.2 10*3/uL (ref 4.0–10.5)

## 2020-02-06 NOTE — Progress Notes (Signed)
Reviewed and agree with documentation and assessment and plan. K. Veena Faris Coolman , MD   

## 2020-02-08 ENCOUNTER — Ambulatory Visit (AMBULATORY_SURGERY_CENTER): Payer: Self-pay | Admitting: Gastroenterology

## 2020-02-08 ENCOUNTER — Other Ambulatory Visit: Payer: Self-pay

## 2020-02-08 ENCOUNTER — Encounter: Payer: Self-pay | Admitting: Gastroenterology

## 2020-02-08 VITALS — BP 142/91 | HR 103 | Temp 97.5°F | Resp 16 | Ht 65.0 in | Wt 143.0 lb

## 2020-02-08 DIAGNOSIS — K228 Other specified diseases of esophagus: Secondary | ICD-10-CM

## 2020-02-08 DIAGNOSIS — C159 Malignant neoplasm of esophagus, unspecified: Secondary | ICD-10-CM

## 2020-02-08 DIAGNOSIS — K2289 Other specified disease of esophagus: Secondary | ICD-10-CM

## 2020-02-08 DIAGNOSIS — R1310 Dysphagia, unspecified: Secondary | ICD-10-CM

## 2020-02-08 MED ORDER — SODIUM CHLORIDE 0.9 % IV SOLN: 500.0000 mL | Freq: Once | INTRAVENOUS | Status: AC

## 2020-02-08 NOTE — Progress Notes (Signed)
Dr. Bari Edward is ok to remove the IV after 600cc fluids since patient is able to drink fluids without problems.  Patient is on clear liquid diet and can advance to full liquids if tolerated.

## 2020-02-08 NOTE — Progress Notes (Signed)
Called to room to assist during endoscopic procedure.  Patient ID and intended procedure confirmed with present staff. Received instructions for my participation in the procedure from the performing physician.  

## 2020-02-08 NOTE — Progress Notes (Signed)
Temperature taken by L.S., VS taken by S.M.

## 2020-02-08 NOTE — Progress Notes (Signed)
To PACU, VSS. Report to [email or web address] 

## 2020-02-08 NOTE — Op Note (Addendum)
[facility] Patient Name: Natalie Morrison Procedure Date: 02/08/2020 3:38 PM MRN: [id number] Endoscopist: Mauri Pole , MD Age: 49 Referring MD:  Date of Birth: Jul 30, 1971 Gender: Female Account #: [id number] Procedure:                Upper GI endoscopy Indications:              Dysphagia Medicines:                Monitored Anesthesia Care Procedure:                Pre-Anesthesia Assessment:                           - Prior to the procedure, a History and Physical                            was performed, and patient medications and                            allergies were reviewed. The patient's tolerance of                            previous anesthesia was also reviewed. The risks                            and benefits of the procedure and the sedation                            options and risks were discussed with the patient.                            All questions were answered, and informed consent                            was obtained. Prior Anticoagulants: The patient has                            taken no previous anticoagulant or antiplatelet                            agents. ASA Grade Assessment: III - A patient with                            severe systemic disease. After reviewing the risks                            and benefits, the patient was deemed in                            satisfactory condition to undergo the procedure.                           After obtaining informed consent, the endoscope was  passed under direct vision. Throughout the                            procedure, the patient's blood pressure, pulse, and                            oxygen saturations were monitored continuously. The                            Endoscope was introduced through the mouth, and                            advanced to the middle third of esophagus. The                            upper GI endoscopy was accomplished without                             difficulty. The patient tolerated the procedure                            well. Scope In: Scope Out: Findings:                 A large, fungating and ulcerating mass with                            bleeding and stigmata of recent bleeding was found                            in the middle third of the esophagus, 28 cm from                            the incisors. The mass was partially obstructing,                            unable to extend scope beyond the lesion to                            determine the extent. Biopsies were taken with a                            cold forceps for histology.                           Food/pills was found in the middle third of the                            esophagus. Removal of food was accomplished. Complications:            No immediate complications. Estimated Blood Loss:     Estimated blood loss was minimal. 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 Biopsied.                           - Food in the middle third of the esophagus.                            Removal was successful. Recommendation:           - Patient has a contact number available for                            emergencies. The signs and symptoms of potential                            delayed complications were discussed with the                            patient. Return to normal activities tomorrow.                            Written discharge instructions were provided to the                            patient.                           - Clear liquid diet. Advance to full liquids as                            tolerated. Avoid solid food                           - Await pathology results.                           - CT chest, abd & pelvis with contrast, to be                            scheduled                            - Will need to come to ER and inpatient admission                            if patient is unable to tolerate any liquids to                            mainatin hydration and nutrition Mauri Pole, MD 02/08/2020 4:16:19 PM This report has been signed electronically.

## 2020-02-08 NOTE — Patient Instructions (Addendum)
Await pathology results.  Clear liquid diet. Advance to full liquids as tolerated.  Avoid solid food.  CT chest, abdomen, pelvis with contrast to be scheduled.  If unable to tolerate liquids and maintain nutrition and hydration, will need to go to the ER and inpatient admissions.  YOU HAD AN ENDOSCOPIC PROCEDURE TODAY AT [facility]:   Refer to the procedure report that was given to you for any specific questions about what was found during the examination.  If the procedure report does not answer your questions, please call your gastroenterologist to clarify.  If you requested that your care partner not be given the details of your procedure findings, then the procedure report has been included in a sealed envelope for you to review at your convenience later.  YOU SHOULD EXPECT: Some feelings of bloating in the abdomen. Passage of more gas than usual.  Walking can help get rid of the air that was put into your GI tract during the procedure and reduce the bloating. If you had a lower endoscopy (such as a colonoscopy or flexible sigmoidoscopy) you may notice spotting of blood in your stool or on the toilet paper. If you underwent a bowel prep for your procedure, you may not have a normal bowel movement for a few days.  Please Note:  You might notice some irritation and congestion in your nose or some drainage.  This is from the oxygen used during your procedure.  There is no need for concern and it should clear up in a day or so.  SYMPTOMS TO REPORT IMMEDIATELY:   Following upper endoscopy (EGD)  Vomiting of blood or coffee ground material  New chest pain or pain under the shoulder blades  Painful or persistently difficult swallowing  New shortness of breath  Fever of 100F or higher  Black, tarry-looking stools  For urgent or emergent issues, a gastroenterologist can be reached at any hour by calling [phone]. Do not use MyChart messaging for urgent concerns.     DIET:  We do recommend a small meal at first, but then you may proceed to your regular diet.  Drink plenty of fluids but you should avoid alcoholic beverages for 24 hours.  ACTIVITY:  You should plan to take it easy for the rest of today and you should NOT DRIVE or use heavy machinery until tomorrow (because of the sedation medicines used during the test).    FOLLOW UP: Our staff will call the number listed on your records 48-72 hours following your procedure to check on you and address any questions or concerns that you may have regarding the information given to you following your procedure. If we do not reach you, we will leave a message.  We will attempt to reach you two times.  During this call, we will ask if you have developed any symptoms of COVID 19. If you develop any symptoms (ie: fever, flu-like symptoms, shortness of breath, cough etc.) before then, please call [phone].  If you test positive for Covid 19 in the 2 weeks post procedure, please call and report this information to [place].    If any biopsies were taken you will be contacted by phone or by letter within the next 1-3 weeks.  Please call us at [phone] if you have not heard about the biopsies in 3 weeks.    SIGNATURES/CONFIDENTIALITY: You and/or your care partner have signed paperwork which will be entered into your electronic medical record.  These signatures attest  to the fact that that the information above on your After Visit Summary has been reviewed and is understood.  Full responsibility of the confidentiality of this discharge information lies with you and/or your care-partner. 

## 2020-02-09 ENCOUNTER — Other Ambulatory Visit: Payer: Self-pay

## 2020-02-09 DIAGNOSIS — K2289 Other specified disease of esophagus: Secondary | ICD-10-CM

## 2020-02-09 DIAGNOSIS — R1310 Dysphagia, unspecified: Secondary | ICD-10-CM

## 2020-02-09 DIAGNOSIS — K228 Other specified diseases of esophagus: Secondary | ICD-10-CM

## 2020-02-12 ENCOUNTER — Telehealth: Payer: Self-pay | Admitting: *Deleted

## 2020-02-12 ENCOUNTER — Telehealth: Payer: Self-pay | Admitting: Hematology

## 2020-02-12 ENCOUNTER — Telehealth: Payer: Self-pay

## 2020-02-12 ENCOUNTER — Other Ambulatory Visit: Payer: Self-pay

## 2020-02-12 DIAGNOSIS — C159 Malignant neoplasm of esophagus, unspecified: Secondary | ICD-10-CM

## 2020-02-12 NOTE — Telephone Encounter (Signed)
Message left

## 2020-02-12 NOTE — Telephone Encounter (Signed)
  Follow up Call-  Call back number 02/08/2020  Post procedure Call Back phone  # [phone]  Permission to leave phone message Yes  Some recent data might be hidden     Patient questions:  Do you have a fever, pain , or abdominal swelling? No. Pain Score  0 *  Have you tolerated food without any problems? No.  Pt is on liquid diet.  Advance to full liquids as tolerated.  Avoid solid food.  Have you been able to return to your normal activities? Yes.    Do you have any questions about your discharge instructions: Diet   No. Medications  No. Follow up visit  No.  Do you have questions or concerns about your Care? Yes.    Actions: * If pain score is 4 or above: No action needed, pain <4.   Pt said she feels sore where the mass is located in her esophagus.  She asked if she could take aspirin or tylenol.  I said to try OTC liquid  Tylenol.  To ask the pharmacist how much she should take based on her weight.  Pt said she would try this.  Per Dr. Silverio Decamp this is ok to try but will probably not get much relief until she sees oncology to begin treatment.  Pt said she has an appointment with Dr. Burr Medico at the [facility] this Thursday at 8:00am.  Pt to call us back if needed. maw

## 2020-02-12 NOTE — Telephone Encounter (Signed)
Received a new pt referral from Dr. Silverio Decamp for esophageal cancer. Natalie Morrison has been cld and scheduled to see Dr. Burr Medico on 5/13 at 8am. She's aware to arrive 15 minutes early.

## 2020-02-12 NOTE — Progress Notes (Signed)
[place]   [phone] Fax:([phone] [place] Note   Patient Care Team: Eulas Post, MD as PCP - General Jonnie Finner, RN as Oncology Nurse Navigator Truitt Merle, MD as Consulting Physician (Hematology) Michael Boston, MD as Consulting Physician (General Surgery) Kyung Rudd, MD as Consulting Physician (Radiation Oncology) Mauri Pole, MD as Consulting Physician (Gastroenterology)  Date of Service:  02/15/2020   CHIEF COMPLAINTS/PURPOSE OF CONSULTATION:  Newly Diagnosed Esophageal Cancer   REFERRING PHYSICIAN:  Dr Silverio Decamp   Oncology History Overview Note  Cancer Staging Esophageal cancer [facility]) Staging form: Esophagus - Other Histologies, AJCC 8th Edition - Clinical stage from 02/11/2020: cTX, cN1, cM0 - Signed by Truitt Merle, MD on 02/14/2020    Esophageal cancer ([place])  02/08/2020 Procedure   Upper Endoscopy by Dr Silverio Decamp 02/08/20  IMPRESSION - Partially obstructing, likely malignant esophageal tumor was found in the middle third of the esophagus. Biopsied. - Food in the middle third of the esophagus. Removal was successful.   02/08/2020 Initial Biopsy   Diagnosis 02/08/20 Esophagus, biopsy, mass - INVASIVE SQUAMOUS CELL CARCINOMA - SEE COMMENT Microscopic Comment Based on the biopsy, the carcinoma appears moderately differentiated. Dr. Silverio Decamp was paged on Feb 09, 2020. Dr. Jeannie Done reviewed the case and agrees with the above diagnosis.    02/11/2020 Cancer Staging   Staging form: Esophagus - Other Histologies, AJCC 8th Edition - Clinical stage from 02/11/2020: cTX, cN1, cM0 - Signed by Truitt Merle, MD on 02/14/2020   02/14/2020 Initial Diagnosis   Esophageal cancer ([place])   02/14/2020 Imaging   CT CAP w contrast  IMPRESSION: 1. Irregular wall thickening and an ill-defined mass involving the distal esophagus consistent with known esophageal cancer. 2. Mildly enlarged lymph nodes lateral to the  gastroesophageal junction and in the gastrohepatic ligament, suspicious for metastatic disease. 3. No evidence of distant metastatic disease. 4. 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 5. Aortic Atherosclerosis (ICD10-I70.0).   02/26/2020 -  Chemotherapy   The patient had PALONOSETRON HCL INJECTION 0.25 MG/5ML, 0.25 mg, Intravenous,  Once, 0 of 1 cycle CARBOplatin (PARAPLATIN) in sodium chloride 0.9 % 100 mL chemo infusion, , Intravenous,  Once, 0 of 1 cycle PACLitaxel (TAXOL) 84 mg in sodium chloride 0.9 % 250 mL chemo infusion (</= 80mg /m2), 50 mg/m2, Intravenous,  Once, 0 of 1 cycle  for chemotherapy treatment.       HISTORY OF PRESENTING ILLNESS:  Natalie Morrison 49 y.o. female is a here because of newly diagnosed Esophageal Cancer. The patient was referred by Dr Silverio Decamp. The patient presents to the clinic today alone.  She has had difficulty swallowing with solid food and pain with swallowing for about 1 year. It was initially mild so she thought it was heartburn. She tried milk and tums which helped for a while.  Her dysphagia and odynophagia has significantly progressed 2-3 months ago. She has stopped eating solid food, on liquid ane jelle pudding, milkshakes and able to keep then down mostly.  She does regurgitation some time. she has not tried nutritional supplements. Her pain with eating can get to 8-9/10 in her right upper mid chest but without eating she has consistent dull pain. She can keep 1 Yetti cup of liquid down. She notes she has lost 15 pounds in the last 6 weeks. She was constipated because she was not eating much but able to have 2 BM this week. She denies blood in stool. She denies cough, SOB,  stomach pain, LE edema, HA or vision problems.   Socially she is married with no children. 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 She notes she does not currently have insurance, but is looking to get some. She is  willing to talk with our financial aid. She has quit smoking 2 years ago and stopped using [place] 1 year ago. She rarely drinks alcohol now and stopped since her pain, but was drinking for 50 years.   They have a PMHx of HTN but has been taking off her HTN meds now since improved. She use to have Anxiety and depression and no longer on medication.  She had a right lumpectomy due to benign mass. Her MGM had unknown cancer. She notes she is not currently taking medication because it is hard to swallow. She is taking liquid Tylenol. She notes her last period was 2 years ago and she no longer has hot flashes.    REVIEW OF SYSTEMS:    Constitutional: Denies fevers, chills or abnormal night sweats (+) Weight loss  Eyes: Denies blurriness of vision, double vision or watery eyes Ears, nose, mouth, throat, and face: Denies mucositis or sore throat (+) Odynophagia, Dysphagia (+) Pain in mid upper right chest  Respiratory: Denies cough, dyspnea or wheezes  Cardiovascular: Denies palpitation, chest discomfort or lower extremity swelling  Gastrointestinal:  Denies heartburn or change in bowel habits (+) N&V with eating  Skin: Denies abnormal skin rashes Lymphatics: Denies new lymphadenopathy or easy bruising Neurological:Denies numbness, tingling or new weaknesses Behavioral/Psych: Mood is stable, no new changes  All other systems were reviewed with the patient and are negative.   MEDICAL HISTORY:  Past Medical History:  Diagnosis Date   Alcohol abuse    Anemia    Anxiety    Anxiety and depression    Asthma    Depression    Hepatitis A    "when I was a kid"   Hypertension     SURGICAL HISTORY: Past Surgical History:  Procedure Laterality Date   BREAST LUMPECTOMY Right    TONSILLECTOMY AND ADENOIDECTOMY Bilateral over 30 years ago   UPPER GASTROINTESTINAL ENDOSCOPY      SOCIAL HISTORY: Social History   Socioeconomic History   Marital status: Married    Spouse name: Not  on file   Number of children: 0   Years of education: Not on file   Highest education level: Not on file  Occupational History   Not on file  Tobacco Use   Smoking status: Former Smoker    Packs/day: 1.00    Years: 33.00    Pack years: 33.00    Types: Cigarettes    Quit date: 02/01/2018    Years since quitting: 2.0   Smokeless tobacco: Never Used  Substance and Sexual Activity   Alcohol use: Yes    Alcohol/week: 2.0 - 3.0 standard drinks    Types: 2 - 3 Shots of liquor per week    Comment: 2-3 drinks per day for 30 years, plan to stop    Drug use: Yes    Frequency: 1.0 times per week    Types: Marijuana    Comment: 10/30/2016 "weekly", quit in 2020    Sexual activity: Yes  Other Topics Concern   Not on file  Social History Narrative   Not on file   Social Determinants of Health   Financial Resource Strain:    Difficulty of Paying Living Expenses:   Food Insecurity:  Worried About Charity fundraiser in the Last Year:    Arboriculturist in the Last Year:   Transportation Needs:    Film/video editor (Medical):    Lack of Transportation (Non-Medical):   Physical Activity:    Days of Exercise per Week:    Minutes of Exercise per Session:   Stress:    Feeling of Stress :   Social Connections:    Frequency of Communication with Friends and Family:    Frequency of Social Gatherings with Friends and Family:    Attends Religious Services:    Active Member of Clubs or Organizations:    Attends Music therapist:    Marital Status:   Intimate Partner Violence:    Fear of Current or Ex-Partner:    Emotionally Abused:    Physically Abused:    Sexually Abused:     FAMILY HISTORY: Family History  Problem Relation Age of Onset   Heart disease Mother    Liver disease Mother    Heart disease Father    Cancer Maternal Grandmother        unknown type cancer    Colon cancer Neg Hx    Esophageal cancer Neg Hx     Rectal cancer Neg Hx    Stomach cancer Neg Hx     ALLERGIES:  is allergic to nitrofurantoin; other; and sulfonamide derivatives.  MEDICATIONS:  Current Outpatient Medications  Medication Sig Dispense Refill   cetirizine (ZYRTEC) 10 MG chewable tablet Chew 10 mg by mouth at bedtime.      EPINEPHrine (PRIMATENE MIST IN) Inhale 1 puff into the lungs daily as needed.     fluticasone (FLONASE) 50 MCG/ACT nasal spray Place 1 spray into both nostrils daily.     HYDROcodone-acetaminophen (HYCET) 7.5-325 mg/15 ml solution Take 5-10 mLs by mouth every 8 (eight) hours as needed for moderate pain or severe pain. Take 30 minutes before meal for severe pain 120 mL 0   Multiple Vitamin (MULTIVITAMIN WITH MINERALS) TABS tablet Take 1 tablet by mouth daily. One a Day Women's     pantoprazole (PROTONIX) 40 MG tablet Take 1 tablet (40 mg total) by mouth daily. 30 tablet 6   vitamin B-12 1000 MCG tablet Take 1 tablet (1,000 mcg total) by mouth daily. 30 tablet 0   Current Facility-Administered Medications  Medication Dose Route Frequency Provider Last Rate Last Admin   0.9 %  sodium chloride infusion  500 mL Intravenous Once Nandigam, Venia Minks, MD        PHYSICAL EXAMINATION: ECOG PERFORMANCE STATUS: 2 - Symptomatic, <50% confined to bed  Vitals:   02/15/20 0809  BP: (!) 144/104  Pulse: 98  Resp: 20  Temp: 98.3 F (36.8 C)  SpO2: 100%   Filed Weights   02/15/20 0809  Weight: 139 lb 8 oz (63.3 kg)    GENERAL:alert, no distress and comfortable SKIN: skin color, texture, turgor are normal, no rashes or significant lesions EYES: normal, Conjunctiva are pink and non-injected, sclera clear  NECK: supple, thyroid normal size, non-tender, without nodularity LYMPH:  no palpable lymphadenopathy in the cervical, axillary  LUNGS: clear to auscultation and percussion with normal breathing effort HEART: regular rate & rhythm and no murmurs and no lower extremity edema ABDOMEN:abdomen soft,  non-tender and normal bowel sounds Musculoskeletal:no cyanosis of digits and no clubbing  NEURO: alert & oriented x 3 with fluent speech, no focal motor/sensory deficits  LABORATORY DATA:  I have reviewed the data as  listed CBC Latest Ref Rng & Units 02/15/2020 01/31/2020 03/29/2018  WBC 4.0 - 10.5 K/uL 9.3 10.2 7.7  Hemoglobin 12.0 - 15.0 g/dL 14.8 14.3 13.1  Hematocrit 36.0 - 46.0 % 41.8 41.4 38.2  Platelets 150 - 400 K/uL 232 235.0 383.0    CMP Latest Ref Rng & Units 02/15/2020 01/31/2020 03/04/2018  Glucose 70 - 99 mg/dL 112(H) 95 94  BUN 6 - 20 mg/dL 7 15 19   Creatinine 0.44 - 1.00 mg/dL 0.94 0.88 0.53  Sodium 135 - 145 mmol/L 138 137 135  Potassium 3.5 - 5.1 mmol/L 2.7(LL) 4.9 5.2(H)  Chloride 98 - 111 mmol/L 95(L) 97 99  CO2 22 - 32 mmol/L 21(L) 23 27  Calcium 8.9 - 10.3 mg/dL 10.2 10.3 10.1  Total Protein 6.5 - 8.1 g/dL 7.5 7.7 -  Total Bilirubin 0.3 - 1.2 mg/dL 0.9 0.9 -  Alkaline Phos 38 - 126 U/L 64 67 -  AST 15 - 41 U/L 34 57(H) -  ALT 0 - 44 U/L 69(H) 72(H) -     RADIOGRAPHIC STUDIES: I have personally reviewed the radiological images as listed and agreed with the findings in the report. CT CHEST W CONTRAST  Result Date: 02/14/2020 CLINICAL DATA:  Dysphagia for 2 months. Esophageal mass. 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 EXAM: CT CHEST, ABDOMEN, AND PELVIS WITH CONTRAST TECHNIQUE: Multidetector CT imaging of the chest, abdomen and pelvis was performed following the standard protocol during bolus administration of intravenous contrast. CONTRAST:  137mL OMNIPAQUE IOHEXOL 300 MG/ML  SOLN COMPARISON:  Chest radiographs 08/24/2018, chest CT 02/22/2018 and abdominal CT 08/23/2009. No available endoscopy report. FINDINGS: CT CHEST FINDINGS Cardiovascular: No acute vascular findings. Mild atherosclerosis of the aorta, great vessels and coronary arteries. The heart size is normal. There is no pericardial effusion. Mediastinum/Nodes: There are no enlarged  mediastinal, hilar or axillary lymph nodes. There is irregular wall thickening and an ill-defined mass involving the distal esophagus, inferior to the carina. This extends approximately 6.1 cm in length on sagittal image 91/5. No proximal esophageal dilatation or air-fluid levels. Mild indentation of the posterior wall of the left atrium without definite cardiovascular invasion. Lungs/Pleura: There is no pleural effusion or pneumothorax. Interval resolution of the extensive ground-glass and airspace opacities in both lungs consistent with resolved pneumonia. No suspicious pulmonary nodules. Musculoskeletal/Chest wall: No chest wall mass or suspicious osseous findings. CT ABDOMEN AND PELVIS FINDINGS Hepatobiliary: The liver is normal in density without suspicious focal abnormality. No evidence of gallstones, gallbladder wall thickening or biliary dilatation. Pancreas: Diffusely atrophied. No ductal dilatation or surrounding inflammation. Spleen: Normal in size without focal abnormality. Adrenals/Urinary Tract: Both adrenal glands appear normal. The kidneys appear normal without evidence of urinary tract calculus, suspicious lesion or hydronephrosis. No bladder abnormalities are seen. Stomach/Bowel: No evidence of bowel wall thickening, distention or surrounding inflammatory change. No gastric mass identified. Moderate stool in the distal colon. Vascular/Lymphatic: There is an enlarged lymph node lateral to the gastroesophageal junction, superior to the gastric fundus, measuring 10 mm short axis on image 42/2. There is a prominent lymph node in the gastrohepatic ligament measuring 8 mm on image 51/2. No other enlarged abdominopelvic lymph nodes. No acute vascular findings. Aortic and branch vessel atherosclerosis. The portal, superior mesenteric and splenic veins are patent. Reproductive: The uterus and ovaries appear normal. No adnexal mass. Other: No ascites or peritoneal nodularity. Musculoskeletal: No acute  osseous findings or evidence of metastatic disease. Bilateral femoral head avascular necrosis without subchondral  collapse. Moderate lower lumbar spondylosis. IMPRESSION: 1. Irregular wall thickening and an ill-defined mass involving the distal esophagus consistent with known esophageal cancer. 2. Mildly enlarged lymph nodes lateral to the gastroesophageal junction and in the gastrohepatic ligament, suspicious for metastatic disease. 3. No evidence of distant metastatic disease. 4. 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 5. Aortic Atherosclerosis (ICD10-I70.0). Electronically Signed   By: Richardean Sale M.D.   On: 02/14/2020 16:48   CT ABDOMEN PELVIS W CONTRAST  Result Date: 02/14/2020 CLINICAL DATA:  Dysphagia for 2 months. Esophageal mass. 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 EXAM: CT CHEST, ABDOMEN, AND PELVIS WITH CONTRAST TECHNIQUE: Multidetector CT imaging of the chest, abdomen and pelvis was performed following the standard protocol during bolus administration of intravenous contrast. CONTRAST:  18mL OMNIPAQUE IOHEXOL 300 MG/ML  SOLN COMPARISON:  Chest radiographs 08/24/2018, chest CT 02/22/2018 and abdominal CT 08/23/2009. No available endoscopy report. FINDINGS: CT CHEST FINDINGS Cardiovascular: No acute vascular findings. Mild atherosclerosis of the aorta, great vessels and coronary arteries. The heart size is normal. There is no pericardial effusion. Mediastinum/Nodes: There are no enlarged mediastinal, hilar or axillary lymph nodes. There is irregular wall thickening and an ill-defined mass involving the distal esophagus, inferior to the carina. This extends approximately 6.1 cm in length on sagittal image 91/5. No proximal esophageal dilatation or air-fluid levels. Mild indentation of the posterior wall of the left atrium without definite cardiovascular invasion. Lungs/Pleura: There is no pleural effusion or pneumothorax. Interval resolution  of the extensive ground-glass and airspace opacities in both lungs consistent with resolved pneumonia. No suspicious pulmonary nodules. Musculoskeletal/Chest wall: No chest wall mass or suspicious osseous findings. CT ABDOMEN AND PELVIS FINDINGS Hepatobiliary: The liver is normal in density without suspicious focal abnormality. No evidence of gallstones, gallbladder wall thickening or biliary dilatation. Pancreas: Diffusely atrophied. No ductal dilatation or surrounding inflammation. Spleen: Normal in size without focal abnormality. Adrenals/Urinary Tract: Both adrenal glands appear normal. The kidneys appear normal without evidence of urinary tract calculus, suspicious lesion or hydronephrosis. No bladder abnormalities are seen. Stomach/Bowel: No evidence of bowel wall thickening, distention or surrounding inflammatory change. No gastric mass identified. Moderate stool in the distal colon. Vascular/Lymphatic: There is an enlarged lymph node lateral to the gastroesophageal junction, superior to the gastric fundus, measuring 10 mm short axis on image 42/2. There is a prominent lymph node in the gastrohepatic ligament measuring 8 mm on image 51/2. No other enlarged abdominopelvic lymph nodes. No acute vascular findings. Aortic and branch vessel atherosclerosis. The portal, superior mesenteric and splenic veins are patent. Reproductive: The uterus and ovaries appear normal. No adnexal mass. Other: No ascites or peritoneal nodularity. Musculoskeletal: No acute osseous findings or evidence of metastatic disease. 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 Moderate lower lumbar spondylosis. IMPRESSION: 1. Irregular wall thickening and an ill-defined mass involving the distal esophagus consistent with known esophageal cancer. 2. Mildly enlarged lymph nodes lateral to the gastroesophageal junction and in the gastrohepatic ligament, suspicious for metastatic disease. 3. No evidence of distant  metastatic disease. 4. 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 5. Aortic Atherosclerosis (ICD10-I70.0). Electronically Signed   By: Richardean Sale M.D.   On: 02/14/2020 16:48    ASSESSMENT & PLAN:  LATYA HENDRICKSEN is a 49 y.o. Caucasian female with a history of Alcohol abuse, anxiety/depression HTN   1. Esophageal squamous cell carcinoma, in mid esophagus, cTxN1M0 -I discussed her EGD, CT image findings and biopsy results with  her in great detail. Through Upper Endoscopy she was found to have a partially obstructing tumor and biopsy confirmed invasive squamous cells carcinoma. Imaging indicates mildly enlarged LNs lateral to the GEJ. There is no evidence of distant metastasis.  -I discussed although her cancer appears localized it is locally advanced with obstruction and reginal node involvement. Given the positive nodes on CT, I do not think she needs EUS.  -I discussed for locally advanced disease standard treatment includes neoadjuvant concurrent chemoradiation followed by esophagectomy surgery which is curative, but it is overall aggressive cancer, and has high risk of recurrence. -She will proceed with Rad Onc Dr Lisbeth Renshaw today.  -I recommend concurrent chemo therapy with weekly carboplatin and TaxolX6  --Chemotherapy consent: Side effects including but does not limited to, fatigue, nausea, vomiting, diarrhea, hair loss, neuropathy, fluid retention, renal and kidney dysfunction, neutropenic fever, needed for blood transfusion, bleeding, were discussed with patient in great detail. She agrees to proceed. -I discussed after surgery, if she has not residual cancer, I would not recommend adjuvant chemo and she would proceed with 5 year surveillance plan.  -Physical exam unremarkable. She will proceed with baseline labs today    2. Dysphagia, Odynophagia, Weight loss, Malnutrition, Dehydration -She has had mild Dysphagia, Odynophagia for 1 year but has worsened in the  last 2-3 months with mid upper right chest pain 8-9/10 with eating and less pain otherwise.  -She has lost 15 pound in the last 6 weeks  -She is currently eating jello, pudding, milkshakes. She takes liquid tylenol for pain.  -I discussed given her current poor nutrition, significant dysphagia and odynophagia, which will likely get worse during chemoradiation, I recommend J tube placement because her pain and nutrition will worsen on ChemoRT. She is agreeable.  -I recommend she start Ensure for more nutritional support. I will refer her to dietician for consult and counseling.  -I discussed if her pain worsens I can call in stronger pain medication. I dicussed narcotics can cause dependence and is highly regulated and closely monitored. I advised if she chooses to use it, she must stay on refill schedule and lock her medication up for her use only. She voiced god understanding. I will call in Hycocet today (02/15/20).  -Her BP 144/104 today (02/15/20). Will recheck and given IV K today. She is agreeable.    3.  Excessive alcohol usage and heavy smoking history, alcohol Cessation  -She has quit smoking 2 years ago and stopped using [place] 1 year ago. She rarely drinks alcohol now and stopped since her pain, but was drinking for 50 years.  -I discussed her history of long term smoking and alcohol use is related to this type of esophageal cancer. I advised her to quit drinking alcohol completely. She is agreeable.    4. B12 Deficiency  -She has been on oral B12 but has not been able to swallow due to #1/#2 -I will check her B12 level today and if significantly low may start her on B12 injections.    5. Social and Acupuncturist  -She is married with no children. She has in-laws in town -She is currently unemployed since late 2019, she was working in a bank. Her husband is a bar tender -She currently does not have insurance, but is looking to get coverage.  -I will have her consult with our  financial office and financial advocate to help her apply for grants and coverage. She is agreeable.    PLAN:  -Send urgent dietician referral.  -  SW consult  -lab today  -I called in liquid KCL and Hycocet  -IV KCL today  -I recommend hospital admission for J-tube placement ASAP. Due to facility issues, no hospital beds today are very tight, will try next Monday   -rad/onc referral    Orders Placed This Encounter  Procedures   CBC with Differential ([place] Only)    Standing Status:   Standing    Number of Occurrences:   100    Standing Expiration Date:   02/14/2025   CMP ([place] only)    Standing Status:   Standing    Number of Occurrences:   100    Standing Expiration Date:   02/14/2025   Ferritin    Standing Status:   Standing    Number of Occurrences:   100    Standing Expiration Date:   02/14/2025   Iron and TIBC    Standing Status:   Standing    Number of Occurrences:   100    Standing Expiration Date:   02/14/2025   Vitamin B12    Standing Status:   Standing    Number of Occurrences:   100    Standing Expiration Date:   02/14/2025   Folate, Serum    Standing Status:   Future    Number of Occurrences:   1    Standing Expiration Date:   02/14/2021   Ambulatory referral to Cardiothoracic Surgery    Referral Priority:   Routine    Referral Type:   Surgical    Referral Reason:   Specialty Services Required    Requested Specialty:   Cardiothoracic Surgery    Number of Visits Requested:   1    All questions were answered. The patient knows to call the clinic with any problems, questions or concerns. The total time spent in the appointment was 60 minutes.     Truitt Merle, MD 02/15/2020   I, Joslyn Devon, am acting as scribe for Truitt Merle, MD.   I have reviewed the above documentation for accuracy and completeness, and I agree with the above.

## 2020-02-12 NOTE — Telephone Encounter (Signed)
-----   Message from Mauri Pole, MD sent at 02/09/2020  8:07 PM EDT ----- Called patient and informed results. Esophageal squamous carcinoma  She is tolerating liquids well, advised her that office will contact her on Monday with the appointments.  Please schedule staging CT chest, abdomen and pelvis as soon as possible.   Urgent referral to oncology to discuss treatment options

## 2020-02-12 NOTE — Telephone Encounter (Signed)
Spoke with the patient. She has her contrast that she was given after her EGD. Instructions for her CT imaging discussed. Questions answered.

## 2020-02-12 NOTE — Telephone Encounter (Signed)
Patient is scheduled for CT imaging on 02/14/20 at [facility]. She will need to go to the radiology department before Wednesday 02/14/20 to pick up her instructions and the contrast if she does not have it already. Her appointment is for 02/14/20 arrive at 2:15pm. NPO for 4 hours before the exam (start at 10:30am) First bottle of contrast at 12:30pm Second bottle of contrast at 1:30pm  Called the patient. No answer. Left a voicemail.

## 2020-02-13 ENCOUNTER — Other Ambulatory Visit: Payer: Self-pay | Admitting: Radiation Oncology

## 2020-02-13 DIAGNOSIS — C154 Malignant neoplasm of middle third of esophagus: Secondary | ICD-10-CM

## 2020-02-13 NOTE — Progress Notes (Signed)
Attempted to reach patient to introduce myself as GI navigator, I left a voice message with my information and direct call back number for her to call with any questions or concerns she has at present.  I reminded her of her upcoming appointment with Dr. Burr Medico on Thursday 5/13 at 8:00 am.

## 2020-02-14 ENCOUNTER — Ambulatory Visit (HOSPITAL_COMMUNITY)
Admission: RE | Admit: 2020-02-14 | Discharge: 2020-02-14 | Disposition: A | Discharge status: Home / Self Care | Payer: Self-pay | Source: Ambulatory Visit | Attending: Gastroenterology | Admitting: Gastroenterology

## 2020-02-14 ENCOUNTER — Encounter (HOSPITAL_COMMUNITY): Payer: Self-pay

## 2020-02-14 ENCOUNTER — Other Ambulatory Visit: Payer: Self-pay

## 2020-02-14 DIAGNOSIS — R1310 Dysphagia, unspecified: Secondary | ICD-10-CM | POA: Insufficient documentation

## 2020-02-14 DIAGNOSIS — C155 Malignant neoplasm of lower third of esophagus: Secondary | ICD-10-CM | POA: Insufficient documentation

## 2020-02-14 DIAGNOSIS — K228 Other specified diseases of esophagus: Secondary | ICD-10-CM | POA: Insufficient documentation

## 2020-02-14 DIAGNOSIS — K2289 Other specified disease of esophagus: Secondary | ICD-10-CM

## 2020-02-14 MED ORDER — IOHEXOL 300 MG/ML  SOLN
100.0000 mL | Freq: Once | INTRAMUSCULAR | Status: AC | PRN
  Administered 2020-02-14: 15:00:00 100 mL via INTRAVENOUS

## 2020-02-14 MED ORDER — SODIUM CHLORIDE (PF) 0.9 % IJ SOLN
INTRAMUSCULAR | Status: AC
  Filled 2020-02-14: qty 50

## 2020-02-15 ENCOUNTER — Other Ambulatory Visit: Payer: Self-pay

## 2020-02-15 ENCOUNTER — Encounter: Payer: Self-pay | Admitting: General Practice

## 2020-02-15 ENCOUNTER — Other Ambulatory Visit: Payer: Self-pay | Admitting: Emergency Medicine

## 2020-02-15 ENCOUNTER — Encounter: Payer: Self-pay | Admitting: Hematology

## 2020-02-15 ENCOUNTER — Inpatient Hospital Stay: Payer: Self-pay | Attending: Hematology | Admitting: Hematology

## 2020-02-15 ENCOUNTER — Inpatient Hospital Stay: Payer: Self-pay

## 2020-02-15 ENCOUNTER — Ambulatory Visit (HOSPITAL_COMMUNITY): Payer: Self-pay | Admitting: Surgery

## 2020-02-15 DIAGNOSIS — E538 Deficiency of other specified B group vitamins: Secondary | ICD-10-CM

## 2020-02-15 DIAGNOSIS — J45909 Unspecified asthma, uncomplicated: Secondary | ICD-10-CM | POA: Insufficient documentation

## 2020-02-15 DIAGNOSIS — C154 Malignant neoplasm of middle third of esophagus: Secondary | ICD-10-CM

## 2020-02-15 DIAGNOSIS — F419 Anxiety disorder, unspecified: Secondary | ICD-10-CM

## 2020-02-15 DIAGNOSIS — F329 Major depressive disorder, single episode, unspecified: Secondary | ICD-10-CM

## 2020-02-15 DIAGNOSIS — E876 Hypokalemia: Secondary | ICD-10-CM

## 2020-02-15 DIAGNOSIS — R1310 Dysphagia, unspecified: Secondary | ICD-10-CM

## 2020-02-15 DIAGNOSIS — E46 Unspecified protein-calorie malnutrition: Secondary | ICD-10-CM

## 2020-02-15 DIAGNOSIS — E871 Hypo-osmolality and hyponatremia: Secondary | ICD-10-CM

## 2020-02-15 DIAGNOSIS — F1010 Alcohol abuse, uncomplicated: Secondary | ICD-10-CM

## 2020-02-15 DIAGNOSIS — I10 Essential (primary) hypertension: Secondary | ICD-10-CM

## 2020-02-15 DIAGNOSIS — E860 Dehydration: Secondary | ICD-10-CM

## 2020-02-15 DIAGNOSIS — R634 Abnormal weight loss: Secondary | ICD-10-CM

## 2020-02-15 LAB — CMP (CANCER CENTER ONLY)
ALT: 69 U/L — ABNORMAL HIGH (ref 0–44)
AST: 34 U/L (ref 15–41)
Albumin: 3.9 g/dL (ref 3.5–5.0)
Alkaline Phosphatase: 64 U/L (ref 38–126)
Anion gap: 22 — ABNORMAL HIGH (ref 5–15)
BUN: 7 mg/dL (ref 6–20)
CO2: 21 mmol/L — ABNORMAL LOW (ref 22–32)
Calcium: 10.2 mg/dL (ref 8.9–10.3)
Chloride: 95 mmol/L — ABNORMAL LOW (ref 98–111)
Creatinine: 0.94 mg/dL (ref 0.44–1.00)
GFR, Est AFR Am: >60 mL/min (ref >60)
GFR, Estimated: >60 mL/min (ref >60)
Glucose, Bld: 112 mg/dL — ABNORMAL HIGH (ref 70–99)
Potassium: 2.7 mmol/L — CL (ref 3.5–5.1)
Sodium: 138 mmol/L (ref 135–145)
Total Bilirubin: 0.9 mg/dL (ref 0.3–1.2)
Total Protein: 7.5 g/dL (ref 6.5–8.1)

## 2020-02-15 LAB — CBC WITH DIFFERENTIAL (CANCER CENTER ONLY)
Abs Immature Granulocytes: 0.07 10*3/uL (ref 0.00–0.07)
Basophils Absolute: 0.1 10*3/uL (ref 0.0–0.1)
Basophils Relative: 1 %
Eosinophils Absolute: 0.2 10*3/uL (ref 0.0–0.5)
Eosinophils Relative: 2 %
HCT: 41.8 % (ref 36.0–46.0)
Hemoglobin: 14.8 g/dL (ref 12.0–15.0)
Immature Granulocytes: 1 %
Lymphocytes Relative: 28 %
Lymphs Abs: 2.6 10*3/uL (ref 0.7–4.0)
MCH: 35.7 pg — ABNORMAL HIGH (ref 26.0–34.0)
MCHC: 35.4 g/dL (ref 30.0–36.0)
MCV: 101 fL — ABNORMAL HIGH (ref 80.0–100.0)
Monocytes Absolute: 1.4 10*3/uL — ABNORMAL HIGH (ref 0.1–1.0)
Monocytes Relative: 16 %
Neutro Abs: 4.9 10*3/uL (ref 1.7–7.7)
Neutrophils Relative %: 52 %
Platelet Count: 232 10*3/uL (ref 150–400)
RBC: 4.14 MIL/uL (ref 3.87–5.11)
RDW: 11.9 % (ref 11.5–15.5)
WBC Count: 9.3 10*3/uL (ref 4.0–10.5)
nRBC: 0 % (ref 0.0–0.2)

## 2020-02-15 LAB — FERRITIN: Ferritin: 453 ng/mL — ABNORMAL HIGH (ref 11–307)

## 2020-02-15 LAB — IRON AND TIBC
Iron: 80 ug/dL (ref 41–142)
Saturation Ratios: 34 % (ref 21–57)
TIBC: 235 ug/dL — ABNORMAL LOW (ref 236–444)
UIBC: 155 ug/dL (ref 120–384)

## 2020-02-15 LAB — FOLATE: Folate: 22.6 ng/mL (ref >5.9)

## 2020-02-15 LAB — VITAMIN B12: Vitamin B-12: 2136 pg/mL — ABNORMAL HIGH (ref 180–914)

## 2020-02-15 MED ORDER — HYDROCODONE-ACETAMINOPHEN 7.5-325 MG/15ML PO SOLN: 5.0000 mL | Freq: Three times a day (TID) | ORAL | 0 refills | Status: DC | PRN

## 2020-02-15 MED ORDER — POTASSIUM CHLORIDE 20 MEQ PO PACK: 20.0000 meq | PACK | Freq: Every day | ORAL | 1 refills | Status: DC

## 2020-02-15 MED ORDER — SODIUM CHLORIDE 0.9 % IV SOLN: Freq: Once | INTRAVENOUS | Status: DC

## 2020-02-15 MED ORDER — POTASSIUM CHLORIDE IN NACL 20-0.9 MEQ/L-% IV SOLN
Freq: Once | INTRAVENOUS | Status: AC
  Filled 2020-02-15: qty 1000

## 2020-02-15 NOTE — Progress Notes (Signed)
[place] CSW Progress Notes  Request from oncologist to talk w patient about options for obtaining insurance and disability coverage.  Called patient - she is anticipating being admitted to [facility] has asked [facility] to meet w her while inpatient to help her apply for Medicaid and disability.  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  Explained that if she does not meet the financial limits for Medicaid, she can look on the [employer] for health coverage - provided information on [place] Navigator/Legal Aid option for counseling to find best option for coverage if Medicaid is denied.  Will follow up w patient for other needs after she is discharged and returns to [facility] for follow up.    Edwyna Shell, LCSW Clinical Social Worker Phone:  [phone] Cell:  [phone]

## 2020-02-15 NOTE — Progress Notes (Signed)
Spoke with patient to let her know her potassium is very low 2.7, she needs IV potassium, she states she is coming back this afternoon around 2:00 to get admitted to the hospital, Dr. Burr Medico is aware.

## 2020-02-15 NOTE — Patient Instructions (Signed)

## 2020-02-15 NOTE — Progress Notes (Signed)
Critical Value:  K 2.7  Dr. Burr Medico notified

## 2020-02-15 NOTE — Progress Notes (Signed)
Pt declined to eat/drink during infusion today or to use restroom, tolerated IVF with potassium well.  VSS.  Able to ambulate w/belongings and drink supplements to exit at end of tx.  Pt aware to f/u as needed with any questions/concerns.

## 2020-02-15 NOTE — Progress Notes (Signed)
am

## 2020-02-15 NOTE — Progress Notes (Signed)
Spoke with bed placement at [facility], no beds available and none at [facility].  Made Dr. Burr Medico aware, called patient to let her know no admission today, asked that she come back in at 2:00 for IV potassium for two hours and she agreed.  Scheduling message was sent to add to [facility] schedule.

## 2020-02-15 NOTE — Progress Notes (Signed)
START ON PATHWAY REGIMEN - Gastroesophageal     Administer weekly during RT:     Paclitaxel      Carboplatin   **Always confirm dose/schedule in your pharmacy ordering system**  Patient Characteristics: Esophageal & GE Junction, Squamous Cell, Preoperative or Nonsurgical Candidate (Clinical Staging), cT2 or Higher or cN+, Surgical Candidate (Up to cT4a) - Preoperative Therapy Histology: Squamous Cell Disease Classification: Esophageal Therapeutic Status: Preoperative or Nonsurgical Candidate (Clinical Staging) AJCC M Category: cM0 AJCC 8 Stage Grouping: Unknown AJCC Grade: GX AJCC Location: Middle AJCC T Category: cTX AJCC N Category: cN1 Intent of Therapy: Curative Intent, Discussed with Patient

## 2020-02-15 NOTE — Progress Notes (Signed)
Met with patient in [facility], brought her a case (24 bottles) of vanilla Ensure, instructed her to try to drink at least 3 or 4 daily along with water.  Explained we will call Monday morning to see about admitting her and will call her when we find out.  She verbalized an understanding.

## 2020-02-15 NOTE — Progress Notes (Signed)
Met with patient at initial medical oncology appointment with Dr. Burr Medico.  Explained my role as nurse navigator and she was given my card with my direct contact number.  She verbalized an understanding of the plan outlined by Dr. Burr Medico to have feeding tube placed.  I have spoken to the patient and gave her the option of being admitted this afternoon for tube placement by Dr. Johney Maine in the morning or try to get something arranged next Monday.  She is agreement to be admitted today however needs to go home to let her husband know.  I instructed her that she would need to be back here no later than 3 pm to get admitted. She will call me when she is on the way back to [facility].  She was given the phone number for [place] and I have spoken to her and she will be reaching out to the patient. We will be making a referral to SW and nutritionist.

## 2020-02-16 ENCOUNTER — Telehealth: Payer: Self-pay | Admitting: Hematology

## 2020-02-16 ENCOUNTER — Encounter: Payer: Self-pay | Admitting: Nutrition

## 2020-02-16 NOTE — Telephone Encounter (Signed)
Scheduled appt per 5/13 los.  Spoke with pt and she is aware of the appt date and time.

## 2020-02-16 NOTE — Progress Notes (Signed)
Provided 1 complementary case of Ensure Enlive. 

## 2020-02-19 NOTE — Progress Notes (Signed)
Spoke with patient per Dr. Burr Medico informed her we will try to get her admitted to the hospital tomorrow 5/18 for feeding tube placement.  I have scheduled her to come in for lab work on 5/18 at 9:00 and to see Cira Rue NP on 9:15 to work on admission.  She verbalized an understanding.

## 2020-02-19 NOTE — Progress Notes (Signed)
GI Location of Tumor / Histology: Esophagus  Natalie Morrison presented with a year long history of pain and difficulty with swallowing solid food.  Her swallowing difficulties worsened and she stopped eating solid foods, only having liquid, jello, pudding, and milkshakes.  CT CAP 02/14/2020: Irregular wall thickening and an ill-defined mass involving the distal esophagus consistent with known esophageal cancer.  Mildly enlarged lymph nodes lateral to the gastroesophageal junction and in the gastrohepatic ligament, suspicious for metastatic disease. No evidence of distant metastatic disease.  Upper Endoscopy 02/08/2020: Partially obstructing, likely malignant esophageal tumor was found in the middle third of the esophagus. Biopsied. - Food in the middle third of the esophagus. Removal was successful.  Biopsies of Esophagus 02/08/2020    Past/Anticipated interventions by surgeon, if any:   Past/Anticipated interventions by medical oncology, if any:  Dr. Burr Medico 02/15/2020 -I discussed although her cancer appears localized it is locally advanced with obstruction and reginal node involvement. Given the positive nodes on CT, I do not think she needs EUS.  -I discussed for locally advanced disease standard treatment includes neoadjuvant concurrent chemoradiation followed by esophagectomy surgery which is curative, but it is overall aggressive cancer, and has high risk of recurrence. -She will proceed with Rad Onc Dr Lisbeth Renshaw today.  -I recommend concurrent chemo therapy with weekly carboplatin and TaxolX6 -I discussed given her current poor nutrition, significant dysphagia and odynophagia, which will likely get worse during chemoradiation, I recommend J tube placement because her pain and nutrition will worsen on ChemoRT. She is agreeable.     Weight changes, if any: She has lost about 15 pounds in the last 6 weeks.  Bowel/Bladder complaints, if any:   Nausea / Vomiting, if any:   Pain issues, if any:         SAFETY ISSUES:  Prior radiation?   Pacemaker/ICD?   Possible current pregnancy? Perimenopausal  Is the patient on methotrexate? No  Current Complaints/Details:

## 2020-02-20 ENCOUNTER — Inpatient Hospital Stay (HOSPITAL_COMMUNITY): Payer: Self-pay | Admitting: Certified Registered Nurse Anesthetist

## 2020-02-20 ENCOUNTER — Inpatient Hospital Stay: Payer: Self-pay

## 2020-02-20 ENCOUNTER — Ambulatory Visit
Admission: RE | Admit: 2020-02-20 | Discharge: 2020-02-20 | Disposition: A | Discharge status: Home / Self Care | Payer: Self-pay | Source: Ambulatory Visit | Attending: Radiation Oncology | Admitting: Radiation Oncology

## 2020-02-20 ENCOUNTER — Ambulatory Visit
Admission: RE | Admit: 2020-02-20 | Discharge: 2020-02-20 | Disposition: A | Discharge status: Home / Self Care | Payer: Self-pay | Source: Ambulatory Visit | Attending: Hematology | Admitting: Hematology

## 2020-02-20 ENCOUNTER — Encounter: Payer: Self-pay | Admitting: Nurse Practitioner

## 2020-02-20 ENCOUNTER — Inpatient Hospital Stay (HOSPITAL_BASED_OUTPATIENT_CLINIC_OR_DEPARTMENT_OTHER): Payer: Self-pay | Admitting: Nurse Practitioner

## 2020-02-20 ENCOUNTER — Encounter: Payer: Self-pay | Admitting: Radiation Oncology

## 2020-02-20 ENCOUNTER — Inpatient Hospital Stay (HOSPITAL_COMMUNITY)
Admission: RE | Admit: 2020-02-20 | Discharge: 2020-02-27 | DRG: 375 | Disposition: A | Discharge status: Home Health | Payer: Self-pay | Source: Ambulatory Visit | Attending: Internal Medicine | Admitting: Internal Medicine

## 2020-02-20 ENCOUNTER — Encounter (HOSPITAL_COMMUNITY): Payer: Self-pay | Admitting: Internal Medicine

## 2020-02-20 ENCOUNTER — Other Ambulatory Visit: Payer: Self-pay

## 2020-02-20 VITALS — BP 126/98 | HR 96 | Temp 98.3°F | Resp 17 | Ht 65.0 in | Wt 140.3 lb

## 2020-02-20 DIAGNOSIS — C155 Malignant neoplasm of lower third of esophagus: Secondary | ICD-10-CM

## 2020-02-20 DIAGNOSIS — Z87891 Personal history of nicotine dependence: Secondary | ICD-10-CM

## 2020-02-20 DIAGNOSIS — R627 Adult failure to thrive: Secondary | ICD-10-CM | POA: Diagnosis present

## 2020-02-20 DIAGNOSIS — R6251 Failure to thrive (child): Secondary | ICD-10-CM | POA: Diagnosis present

## 2020-02-20 DIAGNOSIS — E876 Hypokalemia: Secondary | ICD-10-CM | POA: Diagnosis present

## 2020-02-20 DIAGNOSIS — C154 Malignant neoplasm of middle third of esophagus: Secondary | ICD-10-CM

## 2020-02-20 DIAGNOSIS — Z20822 Contact with and (suspected) exposure to covid-19: Secondary | ICD-10-CM | POA: Diagnosis present

## 2020-02-20 DIAGNOSIS — Z8249 Family history of ischemic heart disease and other diseases of the circulatory system: Secondary | ICD-10-CM

## 2020-02-20 DIAGNOSIS — F329 Major depressive disorder, single episode, unspecified: Secondary | ICD-10-CM | POA: Diagnosis present

## 2020-02-20 DIAGNOSIS — J45909 Unspecified asthma, uncomplicated: Secondary | ICD-10-CM | POA: Diagnosis present

## 2020-02-20 DIAGNOSIS — F419 Anxiety disorder, unspecified: Secondary | ICD-10-CM | POA: Diagnosis present

## 2020-02-20 DIAGNOSIS — Z888 Allergy status to other drugs, medicaments and biological substances status: Secondary | ICD-10-CM

## 2020-02-20 DIAGNOSIS — Z79899 Other long term (current) drug therapy: Secondary | ICD-10-CM

## 2020-02-20 DIAGNOSIS — K222 Esophageal obstruction: Secondary | ICD-10-CM | POA: Diagnosis present

## 2020-02-20 DIAGNOSIS — F1010 Alcohol abuse, uncomplicated: Secondary | ICD-10-CM | POA: Diagnosis present

## 2020-02-20 DIAGNOSIS — Z809 Family history of malignant neoplasm, unspecified: Secondary | ICD-10-CM

## 2020-02-20 DIAGNOSIS — Z882 Allergy status to sulfonamides status: Secondary | ICD-10-CM

## 2020-02-20 DIAGNOSIS — E440 Moderate protein-calorie malnutrition: Secondary | ICD-10-CM | POA: Diagnosis present

## 2020-02-20 DIAGNOSIS — L89150 Pressure ulcer of sacral region, unstageable: Secondary | ICD-10-CM | POA: Diagnosis present

## 2020-02-20 DIAGNOSIS — I10 Essential (primary) hypertension: Secondary | ICD-10-CM | POA: Diagnosis present

## 2020-02-20 DIAGNOSIS — Z6826 Body mass index (BMI) 26.0-26.9, adult: Secondary | ICD-10-CM

## 2020-02-20 LAB — CBC WITH DIFFERENTIAL (CANCER CENTER ONLY)
Abs Immature Granulocytes: 0.05 10*3/uL (ref 0.00–0.07)
Basophils Absolute: 0.1 10*3/uL (ref 0.0–0.1)
Basophils Relative: 1 %
Eosinophils Absolute: 0.1 10*3/uL (ref 0.0–0.5)
Eosinophils Relative: 1 %
HCT: 38.3 % (ref 36.0–46.0)
Hemoglobin: 13.4 g/dL (ref 12.0–15.0)
Immature Granulocytes: 1 %
Lymphocytes Relative: 35 %
Lymphs Abs: 3.2 10*3/uL (ref 0.7–4.0)
MCH: 35.6 pg — ABNORMAL HIGH (ref 26.0–34.0)
MCHC: 35 g/dL (ref 30.0–36.0)
MCV: 101.9 fL — ABNORMAL HIGH (ref 80.0–100.0)
Monocytes Absolute: 1.3 10*3/uL — ABNORMAL HIGH (ref 0.1–1.0)
Monocytes Relative: 14 %
Neutro Abs: 4.5 10*3/uL (ref 1.7–7.7)
Neutrophils Relative %: 48 %
Platelet Count: 191 10*3/uL (ref 150–400)
RBC: 3.76 MIL/uL — ABNORMAL LOW (ref 3.87–5.11)
RDW: 11.9 % (ref 11.5–15.5)
WBC Count: 9.3 10*3/uL (ref 4.0–10.5)
nRBC: 0 % (ref 0.0–0.2)

## 2020-02-20 LAB — CMP (CANCER CENTER ONLY)
ALT: 65 U/L — ABNORMAL HIGH (ref 0–44)
AST: 44 U/L — ABNORMAL HIGH (ref 15–41)
Albumin: 3.6 g/dL (ref 3.5–5.0)
Alkaline Phosphatase: 64 U/L (ref 38–126)
Anion gap: 17 — ABNORMAL HIGH (ref 5–15)
BUN: 13 mg/dL (ref 6–20)
CO2: 31 mmol/L (ref 22–32)
Calcium: 10.2 mg/dL (ref 8.9–10.3)
Chloride: 92 mmol/L — ABNORMAL LOW (ref 98–111)
Creatinine: 0.77 mg/dL (ref 0.44–1.00)
GFR, Est AFR Am: >60 mL/min (ref >60)
GFR, Estimated: >60 mL/min (ref >60)
Glucose, Bld: 139 mg/dL — ABNORMAL HIGH (ref 70–99)
Potassium: 3.2 mmol/L — ABNORMAL LOW (ref 3.5–5.1)
Sodium: 140 mmol/L (ref 135–145)
Total Bilirubin: 0.8 mg/dL (ref 0.3–1.2)
Total Protein: 6.9 g/dL (ref 6.5–8.1)

## 2020-02-20 LAB — SARS CORONAVIRUS 2 BY RT PCR (HOSPITAL ORDER, PERFORMED IN ~~LOC~~ HOSPITAL LAB): SARS Coronavirus 2: NEGATIVE

## 2020-02-20 LAB — HIV ANTIBODY (ROUTINE TESTING W REFLEX): HIV Screen 4th Generation wRfx: NONREACTIVE

## 2020-02-20 MED ORDER — GABAPENTIN 300 MG PO CAPS: 300.0000 mg | ORAL_CAPSULE | ORAL | Status: DC

## 2020-02-20 MED ORDER — SODIUM CHLORIDE 0.9 % IV SOLN
2.0000 g | INTRAVENOUS | Status: DC
  Filled 2020-02-20: qty 20

## 2020-02-20 MED ORDER — LORAZEPAM 1 MG PO TABS: 1.0000 mg | ORAL_TABLET | ORAL | Status: DC | PRN

## 2020-02-20 MED ORDER — SODIUM CHLORIDE 0.9 % IV SOLN: INTRAVENOUS | Status: DC

## 2020-02-20 MED ORDER — ENSURE PRE-SURGERY PO LIQD
296.0000 mL | Freq: Once | ORAL | Status: AC
  Administered 2020-02-21: 296 mL via ORAL
  Filled 2020-02-20: qty 296

## 2020-02-20 MED ORDER — ACETAMINOPHEN 500 MG PO TABS: 1000.0000 mg | ORAL_TABLET | ORAL | Status: DC

## 2020-02-20 MED ORDER — FOLIC ACID 1 MG PO TABS
1.0000 mg | ORAL_TABLET | Freq: Every day | ORAL | Status: DC
  Administered 2020-02-23: 1 mg via ORAL
  Filled 2020-02-20 (×3): qty 1

## 2020-02-20 MED ORDER — LORAZEPAM 2 MG/ML IJ SOLN
1.0000 mg | INTRAMUSCULAR | Status: DC | PRN
  Administered 2020-02-22: 2 mg via INTRAVENOUS
  Filled 2020-02-20: qty 1

## 2020-02-20 MED ORDER — MORPHINE SULFATE (PF) 2 MG/ML IV SOLN
2.0000 mg | INTRAVENOUS | Status: DC | PRN
  Administered 2020-02-20 – 2020-02-26 (×20): 2 mg via INTRAVENOUS
  Filled 2020-02-20 (×20): qty 1

## 2020-02-20 MED ORDER — ADULT MULTIVITAMIN W/MINERALS CH
1.0000 | ORAL_TABLET | Freq: Every day | ORAL | Status: DC
  Administered 2020-02-23: 1 via ORAL
  Filled 2020-02-20 (×4): qty 1

## 2020-02-20 MED ORDER — CEFAZOLIN SODIUM-DEXTROSE 2-4 GM/100ML-% IV SOLN
2.0000 g | INTRAVENOUS | Status: AC
  Filled 2020-02-20: qty 100

## 2020-02-20 MED ORDER — POTASSIUM CHLORIDE 10 MEQ/100ML IV SOLN
10.0000 meq | INTRAVENOUS | Status: AC
  Filled 2020-02-20 (×2): qty 100

## 2020-02-20 MED ORDER — METRONIDAZOLE IN NACL 5-0.79 MG/ML-% IV SOLN: 500.0000 mg | INTRAVENOUS | Status: DC

## 2020-02-20 MED ORDER — CHLORHEXIDINE GLUCONATE CLOTH 2 % EX PADS: 6.0000 | MEDICATED_PAD | Freq: Once | CUTANEOUS | Status: DC

## 2020-02-20 MED ORDER — BUPIVACAINE LIPOSOME 1.3 % IJ SUSP
20.0000 mL | Freq: Once | INTRAMUSCULAR | Status: DC
  Filled 2020-02-20: qty 20

## 2020-02-20 MED ORDER — THIAMINE HCL 100 MG PO TABS
100.0000 mg | ORAL_TABLET | Freq: Every day | ORAL | Status: DC
  Administered 2020-02-23: 100 mg via ORAL
  Filled 2020-02-20 (×3): qty 1

## 2020-02-20 NOTE — Progress Notes (Signed)
Natalie Morrison   [phone] [phone]   Clinic Follow up Note   Patient Care Team: Eulas Post, MD as PCP - General Jonnie Finner, RN as Oncology Nurse Navigator Truitt Merle, MD as Consulting Physician (Hematology) Michael Boston, MD as Consulting Physician (General Surgery) Kyung Rudd, MD as Consulting Physician (Radiation Oncology) Mauri Pole, MD as Consulting Physician (Gastroenterology) Jackelyn Knife, MD as Rounding Team (Internal Medicine) 02/20/2020  CHIEF COMPLAINT: F/u esophagus cancer  SUMMARY OF ONCOLOGIC HISTORY: Oncology History Overview Note  Cancer Staging Esophageal cancer [facility]) Staging form: Esophagus - Other Histologies, AJCC 8th Edition - Clinical stage from 02/11/2020: cTX, cN1, cM0 - Signed by Truitt Merle, MD on 02/14/2020    Esophageal cancer ([place])  02/08/2020 Procedure   Upper Endoscopy by Dr Silverio Decamp 02/08/20  IMPRESSION - Partially obstructing, likely malignant esophageal tumor was found in the middle third of the esophagus. Biopsied. - Food in the middle third of the esophagus. Removal was successful.   02/08/2020 Initial Biopsy   Diagnosis 02/08/20 Esophagus, biopsy, mass - INVASIVE SQUAMOUS CELL CARCINOMA - SEE COMMENT Microscopic Comment Based on the biopsy, the carcinoma appears moderately differentiated. Dr. Silverio Decamp was paged on Feb 09, 2020. Dr. Jeannie Done reviewed the case and agrees with the above diagnosis.    02/11/2020 Cancer Staging   Staging form: Esophagus - Other Histologies, AJCC 8th Edition - Clinical stage from 02/11/2020: cTX, cN1, cM0 - Signed by Truitt Merle, MD on 02/14/2020   02/14/2020 Initial Diagnosis   Esophageal cancer ([place])   02/14/2020 Imaging   CT CAP w contrast  IMPRESSION: 1. Irregular wall thickening and an ill-defined mass involving the distal esophagus consistent with known esophageal cancer. 2. Mildly enlarged lymph nodes lateral to the gastroesophageal junction and  in the gastrohepatic ligament, suspicious for metastatic disease. 3. No evidence of distant metastatic disease. 4. 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 5. Aortic Atherosclerosis (ICD10-I70.0).   02/26/2020 -  Chemotherapy   The patient had PALONOSETRON HCL INJECTION 0.25 MG/5ML, 0.25 mg, Intravenous,  Once, 0 of 1 cycle CARBOplatin (PARAPLATIN) in sodium chloride 0.9 % 100 mL chemo infusion, , Intravenous,  Once, 0 of 1 cycle PACLitaxel (TAXOL) 84 mg in sodium chloride 0.9 % 250 mL chemo infusion (</= 80mg /m2), 50 mg/m2, Intravenous,  Once, 0 of 1 cycle  for chemotherapy treatment.      CURRENT THERAPY: PENDING neoadjuvant concurrent chemoradiation with carboplatin and taxol   INTERVAL HISTORY: Natalie Morrison returns for f/u as scheduled for planned admission for J tube placement. She is doing well today just tired, didn't sleep well last night. She is able to be up at home doing light household activities, spends 50% or less resting. She is tolerating liquid diet mostly, has not tried jello since last week but it went down OK. Feels like things get stuck in the right chest. Drinks 3 ensure per day plus water. Takes hycet usually once per day, has not had pain med yet today. Pain level is 6/10 currently. She has lost 15 lbs since symptom onset but stable since last week. She did not understand to continue oral K at home. Otherwise, denies fever, chills, cough, chest pain, dyspnea, leg edema, n/v/c/d, or other pain.    MEDICAL HISTORY:  Past Medical History:  Diagnosis Date  . Alcohol abuse   . Anemia   . Anxiety   . Anxiety and depression   . Asthma   . Depression   . Hepatitis A    "  when I was a kid"  . Hypertension     SURGICAL HISTORY: Past Surgical History:  Procedure Laterality Date  . BREAST LUMPECTOMY Right   . TONSILLECTOMY AND ADENOIDECTOMY Bilateral over 30 years ago  . UPPER GASTROINTESTINAL ENDOSCOPY      I have reviewed the social  history and family history with the patient and they are unchanged from previous note.  ALLERGIES:  is allergic to nitrofurantoin; other; and sulfonamide derivatives.  MEDICATIONS:  No current facility-administered medications for this visit.   No current outpatient medications on file.    PHYSICAL EXAMINATION: ECOG PERFORMANCE STATUS: 1-2  Vitals:   02/20/20 0937  BP: (!) 126/98  Pulse: 96  Resp: 17  Temp: 98.3 F (36.8 C)  SpO2: 97%   Filed Weights   02/20/20 0937  Weight: 140 lb 4.8 oz (63.6 kg)    GENERAL:alert, no distress and comfortable SKIN: no rash or significant lesions on exposed skin  EYES: sclera clear LUNGS: clear with normal breathing effort HEART: regular rate & rhythm, no lower extremity edema ABDOMEN: abdomen soft, non-tender and normal bowel sounds NEURO: alert & oriented x 3 with fluent speech, normal gait  LABORATORY DATA:  I have reviewed the data as listed CBC Latest Ref Rng & Units 02/20/2020 02/15/2020 01/31/2020  WBC 4.0 - 10.5 K/uL 9.3 9.3 10.2  Hemoglobin 12.0 - 15.0 g/dL 13.4 14.8 14.3  Hematocrit 36.0 - 46.0 % 38.3 41.8 41.4  Platelets 150 - 400 K/uL 191 232 235.0     CMP Latest Ref Rng & Units 02/20/2020 02/15/2020 01/31/2020  Glucose 70 - 99 mg/dL 139(H) 112(H) 95  BUN 6 - 20 mg/dL 13 7 15   Creatinine 0.44 - 1.00 mg/dL 0.77 0.94 0.88  Sodium 135 - 145 mmol/L 140 138 137  Potassium 3.5 - 5.1 mmol/L 3.2(L) 2.7(LL) 4.9  Chloride 98 - 111 mmol/L 92(L) 95(L) 97  CO2 22 - 32 mmol/L 31 21(L) 23  Calcium 8.9 - 10.3 mg/dL 10.2 10.2 10.3  Total Protein 6.5 - 8.1 g/dL 6.9 7.5 7.7  Total Bilirubin 0.3 - 1.2 mg/dL 0.8 0.9 0.9  Alkaline Phos 38 - 126 U/L 64 64 67  AST 15 - 41 U/L 44(H) 34 57(H)  ALT 0 - 44 U/L 65(H) 69(H) 72(H)      RADIOGRAPHIC STUDIES: I have personally reviewed the radiological images as listed and agreed with the findings in the report. No results found.   ASSESSMENT & PLAN: Natalie Morrison is a 49 y.o. Caucasian  female with a history of Alcohol abuse, anxiety/depression HTN   1. Esophageal squamous cell carcinoma, in mid esophagus, cTxN1M0 -EGD showed a partially obstructing tumor, biopsy confirmed invasive squamous cells carcinoma. Imaging indicates mildly enlarged LNs lateral to the GEJ, she has locally advanced disease. There is no evidence of distant metastasis. Staging PET scan is pending  -she consented to proceed with neoadjuvant concurrent chemoradiation with taxol and carboplatin, planning to start in 2 weeks   2. Dysphagia, Odynophagia, Weight loss, Malnutrition, Dehydration -She has had mild Dysphagia, Odynophagia for 1 year but has worsened in the last 2-3 months with mid upper right chest pain 8-9/10 with eating and less pain otherwise.  -She has lost 15 pound in the last 6 weeks  -Drinking Ensure and water, liquid Hycet, mostly liquid diet -Pending J tube placement during this admission  3.  Excessive alcohol usage and heavy smoking history, alcohol Cessation  -She has quit smoking 2 years ago and stopped using Mariajuana 1  year ago. H/o alcohol use over many decades.  -she has been counseled to quit smoking and drinking alcohol completely   4. B12 Deficiency  -She has been on oral B12 but has not been able to swallow due to #1/#2 -B12 level 2136 on 5/13, no need for B12 inj currently, will monitor    5. Social and Acupuncturist  -She is married with no children. She has in-laws in town. Currently unemployed since late 2019, she was working in a bank. Her husband is a bar tender -She currently does not have insurance, but is looking to get coverage.  -F/u wtih financial advocate to help apply for grants and coverage   Disposition: Ms. Plumlee appears stable. Dysphagia and odynophagia are stable, tolerating liquid diet at this point including liquid pain medication hycet PRN. She lost 15 lbs total, weight is stable from last week. CBC and CMP are stable, K 3.2 improved and  will be repleated while inpatient; plan to change to liquid K at discharge.   We are referring her for admission for J tube placement by general surgery. She has been accepted for direct admission by the hospitalist from clinic today. Dr. Lisbeth Renshaw and Dr. Dema Severin have been made aware of her admission. RT consult is scheduled for today.   We will reschedule her PET scan to 5/24 as an outpatient, and plan to start CCRT 6/1. F/u with first treatment and weekly during chemoRT.   All questions were answered. The patient knows to call the clinic with any problems, questions or concerns. No barriers to learning was detected.     Alla Feeling, NP 02/20/20

## 2020-02-20 NOTE — Progress Notes (Signed)
Patient arrived to unit from cancer center. Report received. Patients husband is at bedside. Oriented to room and call light system. No questions or concerns at this time. Resting in recliner with call light within reach. Will continue to monitor.

## 2020-02-20 NOTE — H&P (Signed)
History and Physical    Natalie Morrison [id number] DOB: 1970/10/30 DOA: 02/20/2020  PCP: Eulas Post, MD   Patient coming from: [place]    Chief Complaint: Loss of appetite, inability to tolerate any food.  HPI: Natalie Morrison is a 49 y.o. female with medical history significant of esophageal squamous cell carcinoma, chronic alcohol abuse, chronic dysphagia/odynophagia/malnutrition, vitamin B12 deficiency who was sent from cancer center to [facility] with direct admission for placement of J-tube.  Patient been troubled with inability to tolerate solid food, weight loss, dysphagia/odynophagia due to her esophageal cancer.  Currently she is just taking clear liquid.  She complains of food getting stuck on her right chest.  She was seen by her oncologist Dr. Annamaria Boots today in the cancer center and was sent for placement of j-tube.  Patient also complains of pain in her throat which she rates as a 6/10.  She reported losing 15 pounds since onset of the symptoms. There is no report of fever, chills, cough, shortness of breath, nausea, vomiting, diarrhea or dysuria. General surgery was consulted for J-tube placement. Patient seen and examined at the bedside on the floor.  Currently she is hemodynamically stable.  She complains of pain on her upper chest.  Husband was present at the bedside. She reports drinking  2 small glasses of hard liquor every day.She is a past smoker.  Review of Systems: As per HPI otherwise 10 point review of systems negative.    Past Medical History:  Diagnosis Date  . Alcohol abuse   . Anemia   . Anxiety   . Anxiety and depression   . Asthma   . Depression   . Hepatitis A    "when I was a kid"  . Hypertension     Past Surgical History:  Procedure Laterality Date  . BREAST LUMPECTOMY Right   . TONSILLECTOMY AND ADENOIDECTOMY Bilateral over 30 years ago  . UPPER GASTROINTESTINAL ENDOSCOPY       reports that she quit smoking about 2  years ago. Her smoking use included cigarettes. She has a 33.00 pack-year smoking history. She has never used smokeless tobacco. She reports current alcohol use of about 2.0 - 3.0 standard drinks of alcohol per week. She reports current drug use. Frequency: 1.00 time per week. Drug: Marijuana.  Allergies  Allergen Reactions  . Nitrofurantoin Hives  . Other Other (See Comments)    Allergies mold, dust per allergy test  . Sulfonamide Derivatives Other (See Comments)    Unknown allergic reaction per husband     Family History  Problem Relation Age of Onset  . Heart disease Mother   . Liver disease Mother   . Heart disease Father   . Cancer Maternal Grandmother        unknown type cancer   . Colon cancer Neg Hx   . Esophageal cancer Neg Hx   . Rectal cancer Neg Hx   . Stomach cancer Neg Hx      Prior to Admission medications   Medication Sig Start Date End Date Taking? Authorizing Provider  acetaminophen (TYLENOL) 325 MG tablet Take 650 mg by mouth every 6 (six) hours as needed for mild pain or headache.   Yes [provider]  HYDROcodone-acetaminophen (HYCET) 7.5-325 mg/15 ml solution Take 5-10 mLs by mouth every 8 (eight) hours as needed for moderate pain or severe pain. Take 30 minutes before meal for severe pain 02/15/20  Yes Truitt Merle, MD    Physical  Exam: Vitals:   02/20/20 1150  BP: (!) 126/98  Pulse: 96  Resp: 16  Temp: 98.3 F (36.8 C)  TempSrc: Oral  Weight: 63.6 kg  Height: 5\' 5"  (1.651 m)    Constitutional: Not in distress Vitals:   02/20/20 1150  BP: (!) 126/98  Pulse: 96  Resp: 16  Temp: 98.3 F (36.8 C)  TempSrc: Oral  Weight: 63.6 kg  Height: 5\' 5"  (1.651 m)   Eyes: PERRL, lids and conjunctivae normal ENMT: Mucous membranes are moist.  Neck: normal, supple, no masses, no thyromegaly Respiratory: clear to auscultation bilaterally, no wheezing, no crackles. Normal respiratory effort. No accessory muscle use.  Cardiovascular: Regular rate  and rhythm, no murmurs / rubs / gallops. No extremity edema.  Abdomen: no tenderness, no masses palpated. No hepatosplenomegaly. Bowel sounds positive.  Musculoskeletal: no clubbing / cyanosis. No joint deformity upper and lower extremities.  Skin: no rashes, lesions, ulcers. No induration Neurologic: CN 2-12 grossly intact.  Strength 5/5 in all 4.  Psychiatric: Normal judgment and insight. Alert and oriented x 3. Normal mood.   Foley Catheter:None  Labs on Admission: I have personally reviewed following labs and imaging studies  CBC: Recent Labs  Lab 02/15/20 0912 02/20/20 0911  WBC 9.3 9.3  NEUTROABS 4.9 4.5  HGB 14.8 13.4  HCT 41.8 38.3  MCV 101.0* 101.9*  PLT 232 [id number]   Basic Metabolic Panel: Recent Labs  Lab 02/15/20 0912 02/20/20 0911  NA 138 140  K 2.7* 3.2*  CL 95* 92*  CO2 21* 31  GLUCOSE 112* 139*  BUN 7 13  CREATININE 0.94 0.77  CALCIUM 10.2 10.2   GFR: Estimated Creatinine Clearance: 75.7 mL/min (by C-G formula based on SCr of 0.77 mg/dL). Liver Function Tests: Recent Labs  Lab 02/15/20 0912 02/20/20 0911  AST 34 44*  ALT 69* 65*  ALKPHOS 64 64  BILITOT 0.9 0.8  PROT 7.5 6.9  ALBUMIN 3.9 3.6   No results for input(s): LIPASE, AMYLASE in the last 168 hours. No results for input(s): AMMONIA in the last 168 hours. Coagulation Profile: No results for input(s): INR, PROTIME in the last 168 hours. Cardiac Enzymes: No results for input(s): CKTOTAL, CKMB, CKMBINDEX, TROPONINI in the last 168 hours. BNP (last 3 results) No results for input(s): PROBNP in the last 8760 hours. HbA1C: No results for input(s): HGBA1C in the last 72 hours. CBG: No results for input(s): GLUCAP in the last 168 hours. Lipid Profile: No results for input(s): CHOL, HDL, LDLCALC, TRIG, CHOLHDL, LDLDIRECT in the last 72 hours. Thyroid Function Tests: No results for input(s): TSH, T4TOTAL, FREET4, T3FREE, THYROIDAB in the last 72 hours. Anemia Panel: No results for input(s):  VITAMINB12, FOLATE, FERRITIN, TIBC, IRON, RETICCTPCT in the last 72 hours. Urine analysis:    Component Value Date/Time   COLORURINE STRAW (A) 02/22/2018 0300   APPEARANCEUR CLEAR 02/22/2018 0300   LABSPEC 1.003 (L) 02/22/2018 0300   PHURINE 5.0 02/22/2018 0300   GLUCOSEU NEGATIVE 02/22/2018 0300   HGBUR NEGATIVE 02/22/2018 0300   HGBUR negative 01/01/2010 1340   BILIRUBINUR NEGATIVE 02/22/2018 0300   BILIRUBINUR 3+ 03/18/2016 1614   KETONESUR NEGATIVE 02/22/2018 0300   PROTEINUR NEGATIVE 02/22/2018 0300   UROBILINOGEN 2.0 03/18/2016 1614   UROBILINOGEN 0.2 05/06/2013 1440   NITRITE NEGATIVE 02/22/2018 0300   LEUKOCYTESUR NEGATIVE 02/22/2018 0300    Radiological Exams on Admission: No results found.   Assessment/Plan Active Problems:   Failure to thrive (0-17)  Malnutrition/failure to thrive: Complains of  odynophagia, dysphagia.  Tolerates only clear liquid diet.  Severely malnourished due to decreased oral intake.  Plan for J-tube placement.  General surgery following.  Will start on tube feeding after J tube placement. Reported 15 pounds weight loss recently.  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  EGD had shown partially obstructing tumor, biopsy had confirmed invasive squamous cell carcinoma.  She has enlarged lymph nodes lateral  to the GEJ.  She has locally advanced disease.  There is no evidence of distant metastasis. She complains of upper chest pain.  As per oncology,her staging PET scan is pending.  Currently there is plan to start on concurrent chemoradiation with Taxol and carboplatin in 2 weeks.  Chronic alcohol abuse: Continues to drink. Drinks 2 small glasses of hard liquor. Monitor for withdrawal, CIWA protocol.  Continue thiamine ,folic acid.  She has quit smoking 2 years ago.  History of vitamin B12 deficiency: Currently on vitamin B12 supplements.  Currently not deficient.  Hypokalemia: We will supplement the potassium.  Severity of  Illness: The appropriate patient status for this patient is INPATIENT.  DVT prophylaxis: SCD Code Status: Full Family Communication: None present at bedside Consults called: Oncology will be following     Shelly Coss MD [facility]  02/20/2020, 12:12 PM

## 2020-02-20 NOTE — Consult Note (Signed)
Natalie Morrison 14-Dec-1970  EC:[id number].    Requesting MD: Dr. Truitt Merle Chief Complaint/Reason for Consult: esophageal obstruction, need for feeding tube  HPI:  This is a 49 yo white female with a history of ETOH use, who was otherwise healthy until recently she developed dysphagia.  She was evaluated by GI and found to have an obstruction of her esophagus with food particulate matter still present.  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  She was referred to Dr. Burr Medico for further treatment.  She is currently only able to take in liquids.  She is drinking Ensure at home.  She is going to start radiation on June 1st.  She is currently being admitted for placement of a feeding J-tube for nutritional support.  ROS: ROS: Please see HPI, otherwise all other systems have been reviewed and are negative except weakness, some chest pain, dysphagia.  Family History  Problem Relation Age of Onset  . Heart disease Mother   . Liver disease Mother   . Heart disease Father   . Cancer Maternal Grandmother        unknown type cancer   . Colon cancer Neg Hx   . Esophageal cancer Neg Hx   . Rectal cancer Neg Hx   . Stomach cancer Neg Hx     Past Medical History:  Diagnosis Date  . Alcohol abuse   . Anemia   . Anxiety   . Anxiety and depression   . Asthma   . Depression   . Hepatitis A    "when I was a kid"  . Hypertension     Past Surgical History:  Procedure Laterality Date  . BREAST LUMPECTOMY Right   . TONSILLECTOMY AND ADENOIDECTOMY Bilateral over 30 years ago  . UPPER GASTROINTESTINAL ENDOSCOPY      Social History:  reports that she quit smoking about 2 years ago. Her smoking use included cigarettes. She has a 33.00 pack-year smoking history. She has never used smokeless tobacco. She reports current alcohol use of about 2.0 - 3.0 standard drinks of alcohol per week. She reports current drug use. Frequency: 1.00 time per week. Drug:  Marijuana.  Allergies:  Allergies  Allergen Reactions  . Nitrofurantoin Hives  . Other Other (See Comments)    Allergies mold, dust per allergy test  . Sulfonamide Derivatives Other (See Comments)    Unknown allergic reaction per husband     Facility-Administered Medications Prior to Admission  Medication Dose Route Frequency Provider Last Rate Last Admin  . 0.9 %  sodium chloride infusion  500 mL Intravenous Once Nandigam, Venia Minks, MD       Medications Prior to Admission  Medication Sig Dispense Refill  . acetaminophen (TYLENOL) 325 MG tablet Take 650 mg by mouth every 6 (six) hours as needed for mild pain or headache.    Marland Kitchen HYDROcodone-acetaminophen (HYCET) 7.5-325 mg/15 ml solution Take 5-10 mLs by mouth every 8 (eight) hours as needed for moderate pain or severe pain. Take 30 minutes before meal for severe pain 120 mL 0     Physical Exam: Blood pressure (!) 126/98, pulse 96, temperature 98.3 F (36.8 C), temperature source Oral, resp. rate 16, height 5\' 5"  (1.651 m), weight 63.6 kg. General: pleasant, WD, WN white female who is laying in bed in NAD HEENT: head is normocephalic, atraumatic.  Sclera are noninjected.  PERRL.  Ears and nose without any masses or lesions.  Mouth is pink  and moist Heart: regular, rate, and rhythm.  Normal s1,s2. No obvious murmurs, gallops, or rubs noted.  Palpable radial and pedal pulses bilaterally Lungs: CTAB, no wheezes, rhonchi, or rales noted.  Respiratory effort nonlabored Abd: soft, NT, ND, +BS, no masses, hernias, or organomegaly MS: all 4 extremities are symmetrical with no cyanosis, clubbing, or edema. Skin: warm and dry with no masses, lesions, or rashes Neuro: Cranial nerves 2-12 grossly intact, sensation is normal throughout Psych: A&Ox3 with an appropriate affect.   Results for orders placed or performed in visit on 02/20/20 (from the past 48 hour(s))  CMP ([place] only)     Status: Abnormal   Collection Time: 02/20/20  9:11  AM  Result Value Ref Range   Sodium 140 135 - 145 mmol/L   Potassium 3.2 (L) 3.5 - 5.1 mmol/L   Chloride 92 (L) 98 - 111 mmol/L   CO2 31 22 - 32 mmol/L   Glucose, Bld 139 (H) 70 - 99 mg/dL    Comment: Glucose reference range applies only to samples taken after fasting for at least 8 hours.   BUN 13 6 - 20 mg/dL   Creatinine 0.77 0.44 - 1.00 mg/dL   Calcium 10.2 8.9 - 10.3 mg/dL   Total Protein 6.9 6.5 - 8.1 g/dL   Albumin 3.6 3.5 - 5.0 g/dL   AST 44 (H) 15 - 41 U/L   ALT 65 (H) 0 - 44 U/L   Alkaline Phosphatase 64 38 - 126 U/L   Total Bilirubin 0.8 0.3 - 1.2 mg/dL   GFR, Est Non Af Am >60 >60 mL/min   GFR, Est AFR Am >60 >60 mL/min   Anion gap 17 (H) 5 - 15    Comment: Performed at [facility], [place] [address], [place], [place] [address]  CBC with Differential ([place] Only)     Status: Abnormal   Collection Time: 02/20/20  9:11 AM  Result Value Ref Range   WBC Count 9.3 4.0 - 10.5 K/uL   RBC 3.76 (L) 3.87 - 5.11 MIL/uL   Hemoglobin 13.4 12.0 - 15.0 g/dL   HCT 38.3 36.0 - 46.0 %   MCV 101.9 (H) 80.0 - 100.0 fL   MCH 35.6 (H) 26.0 - 34.0 pg   MCHC 35.0 30.0 - 36.0 g/dL   RDW 11.9 11.5 - 15.5 %   Platelet Count 191 150 - 400 K/uL   nRBC 0.0 0.0 - 0.2 %   Neutrophils Relative % 48 %   Neutro Abs 4.5 1.7 - 7.7 K/uL   Lymphocytes Relative 35 %   Lymphs Abs 3.2 0.7 - 4.0 K/uL   Monocytes Relative 14 %   Monocytes Absolute 1.3 (H) 0.1 - 1.0 K/uL   Eosinophils Relative 1 %   Eosinophils Absolute 0.1 0.0 - 0.5 K/uL   Basophils Relative 1 %   Basophils Absolute 0.1 0.0 - 0.1 K/uL   Immature Granulocytes 1 %   Abs Immature Granulocytes 0.05 0.00 - 0.07 K/uL    Comment: Performed at [facility], [place] [address], [place], [place] [address]   No results found.    Assessment/Plan H/o HTN, no on meds currently Hypokalemia - being replaced, recheck prior to OR in am  Esophageal obstruction secondary to squamous cell  esophageal cancer We will plan to proceed with an open jejunostomy tube placement tomorrow.  She may have CLD today from our standpoint, but NPO p MN.  We will give Ancef on  call to OR.  The procedure including risks and complications were discussed with the patient.  These include bleeding, infection, leaking around tube, clogging of j-tube, diarrhea, continuous need for feeds and inability to do bolus feeds, possible obstructive symptoms from tube or after tube removed, need for exchange of the tube if becomes clogged or malfunctions, etc.  She understands all of this and is agreeable to proceed.    FEN - NPO p MN VTE - ok for prophylaxis from our standpoint ID - [email or web address] on call to [place], [facility] 02/20/2020, 12:42 PM Please see Amion for pager number during day hours 7:00am-4:30pm or 7:00am -11:30am on weekends

## 2020-02-21 ENCOUNTER — Other Ambulatory Visit: Payer: Self-pay

## 2020-02-21 ENCOUNTER — Telehealth: Payer: Self-pay | Admitting: Nurse Practitioner

## 2020-02-21 LAB — BASIC METABOLIC PANEL
Anion gap: 11 (ref 5–15)
BUN: 12 mg/dL (ref 6–20)
CO2: 27 mmol/L (ref 22–32)
Calcium: 8.6 mg/dL — ABNORMAL LOW (ref 8.9–10.3)
Chloride: 99 mmol/L (ref 98–111)
Creatinine, Ser: 0.6 mg/dL (ref 0.44–1.00)
GFR calc Af Amer: >60 mL/min (ref >60)
GFR calc non Af Amer: >60 mL/min (ref >60)
Glucose, Bld: 102 mg/dL — ABNORMAL HIGH (ref 70–99)
Potassium: 3.1 mmol/L — ABNORMAL LOW (ref 3.5–5.1)
Sodium: 137 mmol/L (ref 135–145)

## 2020-02-21 LAB — CBC
HCT: 35.2 % — ABNORMAL LOW (ref 36.0–46.0)
Hemoglobin: 12 g/dL (ref 12.0–15.0)
MCH: 35.9 pg — ABNORMAL HIGH (ref 26.0–34.0)
MCHC: 34.1 g/dL (ref 30.0–36.0)
MCV: 105.4 fL — ABNORMAL HIGH (ref 80.0–100.0)
Platelets: 168 10*3/uL (ref 150–400)
RBC: 3.34 MIL/uL — ABNORMAL LOW (ref 3.87–5.11)
RDW: 11.9 % (ref 11.5–15.5)
WBC: 7 10*3/uL (ref 4.0–10.5)
nRBC: 0 % (ref 0.0–0.2)

## 2020-02-21 MED ORDER — DIBUCAINE (PERIANAL) 1 % EX OINT
TOPICAL_OINTMENT | CUTANEOUS | Status: AC
  Filled 2020-02-21: qty 28

## 2020-02-21 MED ORDER — OSMOLITE 1.2 CAL PO LIQD: 1000.0000 mL | ORAL | Status: DC

## 2020-02-21 MED ORDER — POVIDONE-IODINE 10 % EX OINT
TOPICAL_OINTMENT | CUTANEOUS | Status: AC
  Filled 2020-02-21: qty 28.35

## 2020-02-21 MED ORDER — OSMOLITE 1.5 CAL PO LIQD
1000.0000 mL | ORAL | Status: DC
  Administered 2020-02-22: 1000 mL
  Filled 2020-02-21 (×2): qty 1000

## 2020-02-21 MED ORDER — POTASSIUM CHLORIDE 10 MEQ/100ML IV SOLN
10.0000 meq | INTRAVENOUS | Status: AC
  Administered 2020-02-21 (×3): 10 meq via INTRAVENOUS
  Filled 2020-02-21 (×3): qty 100

## 2020-02-21 NOTE — Progress Notes (Signed)
Initial Nutrition Assessment  DOCUMENTATION CODES:   Non-severe (moderate) malnutrition in context of chronic illness  INTERVENTION:   Monitor magnesium, potassium, and phosphorus daily for at least 3 days, MD to replete as needed, as pt is at risk for refeeding syndrome.  Once tube ready to use: -Initiate Osmolite 1.5 @ 20 ml/hr x 14 hours via J-tube, advance by 10 ml daily to goal rate of 60 ml/hr x 14 hours. -Recommend free water flushes of 100 ml every 4 hours -Goal rate will provide 1260 kcals, 52g protein and 1240 ml H2O  -Recommend Ensure Enlive po TID, each supplement provides 350 kcal and 20 grams of protein   NUTRITION DIAGNOSIS:   Moderate Malnutrition related to chronic illness, cancer and cancer related treatments as evidenced by mild fat depletion, mild muscle depletion, percent weight loss, energy intake < 75% for > or equal to 1 month.  GOAL:   Patient will meet greater than or equal to 90% of their needs  MONITOR:   Labs, Weight trends, I & O's, TF tolerance, Supplement acceptance  REASON FOR ASSESSMENT:   Consult Assessment of nutrition requirement/status, Enteral/tube feeding initiation and management  ASSESSMENT:   49 y.o. female with medical history significant of esophageal squamous cell carcinoma, chronic alcohol abuse, chronic dysphagia/odynophagia/malnutrition, vitamin B12 deficiency who was sent from cancer center to [facility] with direct admission for placement of J-tube.  Patient been troubled with inability to tolerate solid food, weight loss, dysphagia/odynophagia due to her esophageal cancer.  Patient in room, awaiting J-tube placement this afternoon. Pt NPO for surgery. Patient reports she has been subsisting on water and Ensures (3 per day) for the past couple of weeks. Pt states she has been unable to tolerate any solid foods.  Per chart review, pt's dysphagia began ~1 year ago and has progressively gotten worse. Pt was diagnosed  with esophageal cancer 5/12. Per oncology note, pt to begin chemotherapy and radiation treatments.   Pt with no questions at this time about tube feeding. RD will continue to follow. Pt interested in night feed regimen so she can have time off the pump for treatments during the day. Will put recommendations above for night feeds and if pt can tolerate, she can meet the rest of her nutritional needs PO with Ensures. Pt states she thinks she can drink Ensure with no problem.  Per weight records, pt has lost 12 lbs since 3/23 (7% wt loss x 1.5 months, significant for time frame).  Medications: Folic acid, Multivitamin with minerals daily, Thiamine,  Labs reviewed: Low K  NUTRITION - FOCUSED PHYSICAL EXAM:    Most Recent Value  Orbital Region  Mild depletion  Upper Arm Region  Mild depletion  Thoracic and Lumbar Region  No depletion  Buccal Region  No depletion  Temple Region  No depletion  Clavicle Bone Region  No depletion  Clavicle and Acromion Bone Region  No depletion  Scapular Bone Region  No depletion  Dorsal Hand  No depletion  Patellar Region  Unable to assess  Anterior Thigh Region  Unable to assess  Posterior Calf Region  Moderate depletion  Edema (RD Assessment)  None       Diet Order:   Diet Order            Diet NPO time specified Except for: Sips with Meds  Diet effective midnight              EDUCATION NEEDS:   Education needs have been addressed  Skin:  Skin Assessment: Reviewed RN Assessment  Last BM:  5/17  Height:   Ht Readings from Last 1 Encounters:  02/20/20 5\' 5"  (1.651 m)    Weight:   Wt Readings from Last 1 Encounters:  02/20/20 63.6 kg   BMI:  Body mass index is 23.35 kg/m.  Estimated Nutritional Needs:   Kcal:  1900-2100  Protein:  95-110g  Fluid:  2L/day   Clayton Bibles, MS, RD, LDN Inpatient Clinical Dietitian Contact information available via Amion

## 2020-02-21 NOTE — Progress Notes (Signed)
PROGRESS NOTE    Natalie Morrison  [id number] DOB: 1971/02/23 DOA: 02/20/2020 PCP: Eulas Post, MD   Brief Narrative: Natalie Morrison is a 49 y.o. female with medical history significant of esophageal squamous cell carcinoma, chronic alcohol abuse, chronic dysphagia/odynophagia/malnutrition, vitamin B12 deficiency who was sent from cancer center to [facility] with direct admission for placement of J-tube.  Patient been troubled with inability to tolerate solid food, weight loss, dysphagia/odynophagia due to her esophageal cancer.  Currently she is just taking clear liquid.  She complains of food getting stuck on her right chest.  She was seen by her oncologist Dr. Annamaria Boots today in the cancer center and was sent for placement of j-tube.  General surgery planning for placement of J-tube today.  Assessment & Plan:   Active Problems:   Failure to thrive (0-17)   Malnutrition/failure to thrive: Complains of odynophagia, dysphagia.  Tolerates only clear liquid diet.  Severely malnourished due to decreased oral intake.  Plan for J-tube placement.  General surgery following.  Will start on tube feeding after J tube placement. Reported 15 pounds weight loss recently.  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  EGD had shown partially obstructing tumor, biopsy had confirmed invasive squamous cell carcinoma.  She has enlarged lymph nodes lateral  to the GEJ.  She has locally advanced disease.  There is no evidence of distant metastasis. She complains of upper chest pain.  As per oncology,her staging PET scan is pending.  Currently there is plan to start on concurrent chemoradiation with Taxol and carboplatin in 2 weeks.  Chronic alcohol abuse: Continues to drink. Drinks 2 small glasses of hard liquor. Monitor for withdrawal, CIWA protocol.  Continue thiamine ,folic acid.  She has quit smoking 2 years ago.  History of vitamin B12 deficiency: Currently on vitamin B12  supplements.  Currently not deficient.  Hypokalemia:Supplmented  Nutrition Problem: Moderate Malnutrition Etiology: chronic illness      DVT prophylaxis:SCD Code Status: Full Family Communication: Discussed with husband at bedside on 02/20/2020 Status is: Inpatient  Remains inpatient appropriate because:IV treatments appropriate due to intensity of illness or inability to take PO   Dispo: The patient is from: Home              Anticipated d/c is to: Home              Anticipated d/c date is: 1 day              Patient currently is not medically stable to d/c.     Consultants: General surgery  Procedures: None  Antimicrobials:  Anti-infectives (From admission, onward)   Start     Dose/Rate Route Frequency Ordered Stop   02/21/20 0600  ceFAZolin (ANCEF) IVPB 2g/100 mL premix     2 g 200 mL/hr over 30 Minutes Intravenous On call to O.R. 02/20/20 1229 02/22/20 0559   02/20/20 1145  cefTRIAXone (ROCEPHIN) 2 g in sodium chloride 0.9 % 100 mL IVPB  Status:  Discontinued     2 g 200 mL/hr over 30 Minutes Intravenous On call to O.R. 02/20/20 1141 02/20/20 1229   02/20/20 1145  metroNIDAZOLE (FLAGYL) IVPB 500 mg  Status:  Discontinued     500 mg 100 mL/hr over 60 Minutes Intravenous On call to O.R. 02/20/20 1141 02/20/20 1229      Subjective: Patient seen and examined the bedside this morning.  Hemodynamically stable.  She continues to complain of some mild upper chest discomfort.  Waiting for G-tube placement.  Objective: Vitals:   02/20/20 1605 02/20/20 2156 02/21/20 0519 02/21/20 1418  BP: 120/89 118/86 132/88 (!) 130/93  Pulse: 81 90 83 77  Resp: 16 16 16 18   Temp: 98.1 F (36.7 C) 98.3 F (36.8 C) 98.1 F (36.7 C) 97.6 F (36.4 C)  TempSrc: Oral Oral Oral Oral  SpO2: 97% 95% 97% 97%  Weight:      Height:        Intake/Output Summary (Last 24 hours) at 02/21/2020 1456 Last data filed at 02/20/2020 1900 Gross per 24 hour  Intake 380 ml  Output --  Net 380  ml   Filed Weights   02/20/20 1150  Weight: 63.6 kg    Examination:  General exam: Appears calm and comfortable ,Not in distress,average built HEENT:PERRL,Oral mucosa moist, Ear/Nose normal on gross exam Respiratory system: Bilateral equal air entry, normal vesicular breath sounds, no wheezes or crackles  Cardiovascular system: S1 & S2 heard, RRR. No JVD, murmurs, rubs, gallops or clicks. No pedal edema. Gastrointestinal system: Abdomen is nondistended, soft and nontender. No organomegaly or masses felt. Normal bowel sounds heard. Central nervous system: Alert and oriented. No focal neurological deficits. Extremities: No edema, no clubbing ,no cyanosis Skin: No rashes, lesions or ulcers,no icterus ,no pallor   Data Reviewed: I have personally reviewed following labs and imaging studies  CBC: Recent Labs  Lab 02/15/20 0912 02/20/20 0911 02/21/20 0513  WBC 9.3 9.3 7.0  NEUTROABS 4.9 4.5  --   HGB 14.8 13.4 12.0  HCT 41.8 38.3 35.2*  MCV 101.0* 101.9* 105.4*  PLT 232 191 [id number]   Basic Metabolic Panel: Recent Labs  Lab 02/15/20 0912 02/20/20 0911 02/21/20 0513  NA 138 140 137  K 2.7* 3.2* 3.1*  CL 95* 92* 99  CO2 21* 31 27  GLUCOSE 112* 139* 102*  BUN 7 13 12   CREATININE 0.94 0.77 0.60  CALCIUM 10.2 10.2 8.6*   GFR: Estimated Creatinine Clearance: 75.7 mL/min (by C-G formula based on SCr of 0.6 mg/dL). Liver Function Tests: Recent Labs  Lab 02/15/20 0912 02/20/20 0911  AST 34 44*  ALT 69* 65*  ALKPHOS 64 64  BILITOT 0.9 0.8  PROT 7.5 6.9  ALBUMIN 3.9 3.6   No results for input(s): LIPASE, AMYLASE in the last 168 hours. No results for input(s): AMMONIA in the last 168 hours. Coagulation Profile: No results for input(s): INR, PROTIME in the last 168 hours. Cardiac Enzymes: No results for input(s): CKTOTAL, CKMB, CKMBINDEX, TROPONINI in the last 168 hours. BNP (last 3 results) No results for input(s): PROBNP in the last 8760 hours. HbA1C: No results for  input(s): HGBA1C in the last 72 hours. CBG: No results for input(s): GLUCAP in the last 168 hours. Lipid Profile: No results for input(s): CHOL, HDL, LDLCALC, TRIG, CHOLHDL, LDLDIRECT in the last 72 hours. Thyroid Function Tests: No results for input(s): TSH, T4TOTAL, FREET4, T3FREE, THYROIDAB in the last 72 hours. Anemia Panel: No results for input(s): VITAMINB12, FOLATE, FERRITIN, TIBC, IRON, RETICCTPCT in the last 72 hours. Sepsis Labs: No results for input(s): PROCALCITON, LATICACIDVEN in the last 168 hours.  Recent Results (from the past 240 hour(s))  SARS Coronavirus 2 by RT PCR (hospital order, performed in [facility] hospital lab) Nasopharyngeal Nasopharyngeal Swab     Status: None   Collection Time: 02/20/20  5:28 PM   Specimen: Nasopharyngeal Swab  Result Value Ref Range Status   SARS Coronavirus 2 NEGATIVE NEGATIVE Final    Comment: (  NOTE) SARS-CoV-2 target nucleic acids are NOT DETECTED. The SARS-CoV-2 RNA is generally detectable in upper and lower respiratory specimens during the acute phase of infection. The lowest concentration of SARS-CoV-2 viral copies this assay can detect is 250 copies / mL. A negative result does not preclude SARS-CoV-2 infection and should not be used as the sole basis for treatment or other patient management decisions.  A negative result may occur with improper specimen collection / handling, submission of specimen other than nasopharyngeal swab, presence of viral mutation(s) within the areas targeted by this assay, and inadequate number of viral copies (<250 copies / mL). A negative result must be combined with clinical observations, patient history, and epidemiological information. Fact Sheet for Patients:   StrictlyIdeas.no Fact Sheet for Healthcare Providers: BankingDealers.co.za This test is not yet approved or cleared  by the [place] FDA and has been authorized for detection and/or  diagnosis of SARS-CoV-2 by FDA under an Emergency Use Authorization (EUA).  This EUA will remain in effect (meaning this test can be used) for the duration of the COVID-19 declaration under Section 564(b)(1) of the Act, 21 U.S.C. section 360bbb-3(b)(1), unless the authorization is terminated or revoked sooner. Performed at [facility], [place] [address], [place], [place] [address]          Radiology Studies: No results found.      Scheduled Meds: . bupivacaine liposome  20 mL Infiltration Once  . Chlorhexidine Gluconate Cloth  6 each Topical Once   And  . Chlorhexidine Gluconate Cloth  6 each Topical Once  . folic acid  1 mg Oral Daily  . multivitamin with minerals  1 tablet Oral Daily  . thiamine  100 mg Oral Daily   Continuous Infusions: . sodium chloride 75 mL/hr at 02/21/20 0401  .  ceFAZolin (ANCEF) IV       LOS: 1 day    Time spent:25 mins, More than 50% of that time was spent in counseling and/or coordination of care.      Shelly Coss, MD [facility] P5/19/2021, 2:56 PM

## 2020-02-21 NOTE — Telephone Encounter (Signed)
Scheduled appt per 5/18 los.  Spoke with pt and she is aware of her appt date and time.

## 2020-02-21 NOTE — Progress Notes (Signed)
Radiation Oncology         [phone] ________________________________  Initial Outpatient Consultation - Conducted via telephone due to current COVID-19 concerns for limiting patient exposure  I spoke with the patient to conduct this consult visit via telephone to spare the patient unnecessary potential exposure in the healthcare setting during the current COVID-19 pandemic. The patient was notified in advance and was offered a [place] meeting to allow for face to face communication but unfortunately reported that they did not have the appropriate resources/technology to support such a visit and instead preferred to proceed with a telephone consult.    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   HISTORY OF PRESENT ILLNESS: Natalie Morrison is a 49 y.o. female seen at the request of Dr. 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 The patient apparently has had increasing dysphagia to solids and now liquids, only able at this time to swallow her secretions. She reports that she has had difficulty swallowing with solid food and developed pain approximately a year ago, initially it was felt to be related to heartburn, and over-the-counter antacids seem to help, within the last 2 to 3 months however she started having significant progression of her symptoms and developed solid food intolerance to where she was only eating Jell-O liquid and some dairy products.  She is lost approximately 15 to 20 pounds in the last 2 months, and proceeded with evaluation with GI.  Dr. Silverio Decamp performed endoscopy on 02/08/2019 which revealed a large fungating ulcerating mass with bleeding and stigmata of recent bleeding 28 cm  from the incisors which was partially obstructing unable to extend the scope beyond the lesion, biopsies were obtained and were consistent with a squamous cell carcinoma.  She underwent CT staging on 02/14/2020 which revealed irregular wall thickening and ill-defined mass involving the distal esophagus, mildly enlarged lymph nodes lateral to the gastroesophageal junction and gastrohepatic ligament no evidence of distant metastatic disease was identified.  She did have findings however consistent with bilateral femoral head avascular necrosis and stigmata of aortic atherosclerotic disease.  She is scheduled to undergo a PET scan on Friday.  Given her significant weight loss and malnourishment, she is in the process of getting admitted to undergo a J-tube placement tomorrow.  She is also scheduled to be seen by cardiothoracic surgery next week.  She is contacted to discuss the role of chemoradiation.   PREVIOUS RADIATION THERAPY: No   PAST MEDICAL HISTORY:  Past Medical History:  Diagnosis Date  . Alcohol abuse   . Anemia   . Anxiety   . Anxiety and depression   . Asthma   . Depression   . Hepatitis A    "when I was a kid"  . Hypertension        PAST SURGICAL HISTORY: Past Surgical History:  Procedure Laterality Date  . BREAST LUMPECTOMY Right   . TONSILLECTOMY AND ADENOIDECTOMY Bilateral over 30 years ago  . UPPER GASTROINTESTINAL ENDOSCOPY       FAMILY HISTORY:  Family History  Problem Relation Age of Onset  . Heart disease Mother   . Liver disease Mother   . Heart disease  Father   . Cancer Maternal Grandmother        unknown type cancer   . Colon cancer Neg Hx   . Esophageal cancer Neg Hx   . Rectal cancer Neg Hx   . Stomach cancer Neg Hx      SOCIAL HISTORY:  reports that she quit smoking about 2 years ago. Her smoking use included cigarettes. She has a 33.00 pack-year smoking history. She has never used smokeless tobacco. She reports current alcohol use of about 2.0 -  3.0 standard drinks of alcohol per week. She reports current drug use. Frequency: 1.00 time per week. Drug: Marijuana.   ALLERGIES: Nitrofurantoin, Other, and Sulfonamide derivatives   MEDICATIONS:  No current facility-administered medications for this encounter.   No current outpatient medications on file.   Facility-Administered Medications Ordered in Other Encounters  Medication Dose Route Frequency Provider Last Rate Last Admin  . 0.9 %  sodium chloride infusion   Intravenous Continuous Shelly Coss, MD 75 mL/hr at 02/21/20 0401 New Bag at 02/21/20 0401  . bupivacaine liposome (EXPAREL) 1.3 % injection 266 mg  20 mL Infiltration Once Adhikari, Amrit, MD      . ceFAZolin (ANCEF) IVPB 2g/100 mL premix  2 g Intravenous On Call to OR Saverio Danker, PA-C      . Chlorhexidine Gluconate Cloth 2 % PADS 6 each  6 each Topical Once Shelly Coss, MD       And  . Chlorhexidine Gluconate Cloth 2 % PADS 6 each  6 each Topical Once Adhikari, Amrit, MD      . folic acid (FOLVITE) tablet 1 mg  1 mg Oral Daily Adhikari, Amrit, MD      . LORazepam (ATIVAN) tablet 1-4 mg  1-4 mg Oral Q1H PRN Shelly Coss, MD       Or  . LORazepam (ATIVAN) injection 1-4 mg  1-4 mg Intravenous Q1H PRN Shelly Coss, MD      . morphine 2 MG/ML injection 2 mg  2 mg Intravenous Q4H PRN Shelly Coss, MD   2 mg at 02/21/20 0806  . multivitamin with minerals tablet 1 tablet  1 tablet Oral Daily Adhikari, Amrit, MD      . potassium chloride 10 mEq in 100 mL IVPB  10 mEq Intravenous Q1 Hr x 5 Adhikari, Amrit, MD      . thiamine tablet 100 mg  100 mg Oral Daily Adhikari, Amrit, MD         REVIEW OF SYSTEMS: On review of systems, the patient reports that she is feeling terrible overall, she is now having pain even without swallowing in the mid to lower chest.  She describes intolerance to all solid foods at this time, and is only able to swallow secretions.  She is lost at least 20 pounds in the last 2 months.  She  is also had some episodes of constipation and she has not been taking in much orally.  No other complaints are verbalized.    PHYSICAL EXAM:  Wt Readings from Last 3 Encounters:  02/20/20 140 lb 4.8 oz (63.6 kg)  02/20/20 140 lb 4.8 oz (63.6 kg)  02/15/20 138 lb 11.2 oz (62.9 kg)   Unable to assess given encounter type.   ECOG = 1  0 - Asymptomatic (Fully active, able to carry on all predisease activities without restriction)  1 - Symptomatic but completely ambulatory (Restricted in physically strenuous activity but ambulatory and able to carry out work of a light or sedentary  nature. For example, light housework, office work)  2 - Symptomatic, <50% in bed during the day (Ambulatory and capable of all self care but unable to carry out any work activities. Up and about more than 50% of waking hours)  3 - Symptomatic, >50% in bed, but not bedbound (Capable of only limited self-care, confined to bed or chair 50% or more of waking hours)  4 - Bedbound (Completely disabled. Cannot carry on any self-care. Totally confined to bed or chair)  5 - Death   Eustace Pen MM, Creech RH, Tormey DC, et al. [phone]). "Toxicity and response criteria of the [facility]". [place] Oncol. 5 (6): 649-55    LABORATORY DATA:  Lab Results  Component Value Date   WBC 7.0 02/21/2020   HGB 12.0 02/21/2020   HCT 35.2 (L) 02/21/2020   MCV 105.4 (H) 02/21/2020   PLT 168 02/21/2020   Lab Results  Component Value Date   NA 137 02/21/2020   K 3.1 (L) 02/21/2020   CL 99 02/21/2020   CO2 27 02/21/2020   Lab Results  Component Value Date   ALT 65 (H) 02/20/2020   AST 44 (H) 02/20/2020   ALKPHOS 64 02/20/2020   BILITOT 0.8 02/20/2020      RADIOGRAPHY: CT CHEST W CONTRAST  Result Date: 02/14/2020 CLINICAL DATA:  Dysphagia for 2 months. Esophageal mass. 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 EXAM: CT CHEST, ABDOMEN, AND PELVIS WITH CONTRAST TECHNIQUE:  Multidetector CT imaging of the chest, abdomen and pelvis was performed following the standard protocol during bolus administration of intravenous contrast. CONTRAST:  186mL OMNIPAQUE IOHEXOL 300 MG/ML  SOLN COMPARISON:  Chest radiographs 08/24/2018, chest CT 02/22/2018 and abdominal CT 08/23/2009. No available endoscopy report. FINDINGS: CT CHEST FINDINGS Cardiovascular: No acute vascular findings. Mild atherosclerosis of the aorta, great vessels and coronary arteries. The heart size is normal. There is no pericardial effusion. Mediastinum/Nodes: There are no enlarged mediastinal, hilar or axillary lymph nodes. There is irregular wall thickening and an ill-defined mass involving the distal esophagus, inferior to the carina. This extends approximately 6.1 cm in length on sagittal image 91/5. No proximal esophageal dilatation or air-fluid levels. Mild indentation of the posterior wall of the left atrium without definite cardiovascular invasion. Lungs/Pleura: There is no pleural effusion or pneumothorax. Interval resolution of the extensive ground-glass and airspace opacities in both lungs consistent with resolved pneumonia. No suspicious pulmonary nodules. Musculoskeletal/Chest wall: No chest wall mass or suspicious osseous findings. CT ABDOMEN AND PELVIS FINDINGS Hepatobiliary: The liver is normal in density without suspicious focal abnormality. No evidence of gallstones, gallbladder wall thickening or biliary dilatation. Pancreas: Diffusely atrophied. No ductal dilatation or surrounding inflammation. Spleen: Normal in size without focal abnormality. Adrenals/Urinary Tract: Both adrenal glands appear normal. The kidneys appear normal without evidence of urinary tract calculus, suspicious lesion or hydronephrosis. No bladder abnormalities are seen. Stomach/Bowel: No evidence of bowel wall thickening, distention or surrounding inflammatory change. No gastric mass identified. Moderate stool in the distal colon.  Vascular/Lymphatic: There is an enlarged lymph node lateral to the gastroesophageal junction, superior to the gastric fundus, measuring 10 mm short axis on image 42/2. There is a prominent lymph node in the gastrohepatic ligament measuring 8 mm on image 51/2. No other enlarged abdominopelvic lymph nodes. No acute vascular findings. Aortic and branch vessel atherosclerosis. The portal, superior mesenteric and splenic veins are patent. Reproductive: The uterus and ovaries appear normal. No adnexal mass. Other: No ascites or  peritoneal nodularity. Musculoskeletal: No acute osseous findings or evidence of metastatic disease. 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 Moderate lower lumbar spondylosis. IMPRESSION: 1. Irregular wall thickening and an ill-defined mass involving the distal esophagus consistent with known esophageal cancer. 2. Mildly enlarged lymph nodes lateral to the gastroesophageal junction and in the gastrohepatic ligament, suspicious for metastatic disease. 3. No evidence of distant metastatic disease. 4. 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 5. Aortic Atherosclerosis (ICD10-I70.0). Electronically Signed   By: Richardean Sale M.D.   On: 02/14/2020 16:48   CT ABDOMEN PELVIS W CONTRAST  Result Date: 02/14/2020 CLINICAL DATA:  Dysphagia for 2 months. Esophageal mass. 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 EXAM: CT CHEST, ABDOMEN, AND PELVIS WITH CONTRAST TECHNIQUE: Multidetector CT imaging of the chest, abdomen and pelvis was performed following the standard protocol during bolus administration of intravenous contrast. CONTRAST:  154mL OMNIPAQUE IOHEXOL 300 MG/ML  SOLN COMPARISON:  Chest radiographs 08/24/2018, chest CT 02/22/2018 and abdominal CT 08/23/2009. No available endoscopy report. FINDINGS: CT CHEST FINDINGS Cardiovascular: No acute vascular findings. Mild atherosclerosis of the aorta, great vessels and  coronary arteries. The heart size is normal. There is no pericardial effusion. Mediastinum/Nodes: There are no enlarged mediastinal, hilar or axillary lymph nodes. There is irregular wall thickening and an ill-defined mass involving the distal esophagus, inferior to the carina. This extends approximately 6.1 cm in length on sagittal image 91/5. No proximal esophageal dilatation or air-fluid levels. Mild indentation of the posterior wall of the left atrium without definite cardiovascular invasion. Lungs/Pleura: There is no pleural effusion or pneumothorax. Interval resolution of the extensive ground-glass and airspace opacities in both lungs consistent with resolved pneumonia. No suspicious pulmonary nodules. Musculoskeletal/Chest wall: No chest wall mass or suspicious osseous findings. CT ABDOMEN AND PELVIS FINDINGS Hepatobiliary: The liver is normal in density without suspicious focal abnormality. No evidence of gallstones, gallbladder wall thickening or biliary dilatation. Pancreas: Diffusely atrophied. No ductal dilatation or surrounding inflammation. Spleen: Normal in size without focal abnormality. Adrenals/Urinary Tract: Both adrenal glands appear normal. The kidneys appear normal without evidence of urinary tract calculus, suspicious lesion or hydronephrosis. No bladder abnormalities are seen. Stomach/Bowel: No evidence of bowel wall thickening, distention or surrounding inflammatory change. No gastric mass identified. Moderate stool in the distal colon. Vascular/Lymphatic: There is an enlarged lymph node lateral to the gastroesophageal junction, superior to the gastric fundus, measuring 10 mm short axis on image 42/2. There is a prominent lymph node in the gastrohepatic ligament measuring 8 mm on image 51/2. No other enlarged abdominopelvic lymph nodes. No acute vascular findings. Aortic and branch vessel atherosclerosis. The portal, superior mesenteric and splenic veins are patent. Reproductive: The uterus  and ovaries appear normal. No adnexal mass. Other: No ascites or peritoneal nodularity. Musculoskeletal: No acute osseous findings or evidence of metastatic disease. 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 Moderate lower lumbar spondylosis. IMPRESSION: 1. Irregular wall thickening and an ill-defined mass involving the distal esophagus consistent with known esophageal cancer. 2. Mildly enlarged lymph nodes lateral to the gastroesophageal junction and in the gastrohepatic ligament, suspicious for metastatic disease. 3. No evidence of distant metastatic disease. 4. 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 5. Aortic Atherosclerosis (ICD10-I70.0). Electronically Signed   By: Richardean Sale M.D.   On: 02/14/2020 16:48       IMPRESSION/PLAN: 1. Locally advanced squamous cell carcinoma of the distal esophagus. Dr. Lisbeth Renshaw discusses the pathology findings and reviews the nature  of esophageal cancers.  He describes the difference between histologic cell types, and agrees with proceeding with PET scan to further work-up her diagnosis.  Provided that she does not have distant disease, she would be a good candidate for chemoradiation.  Also given her nutritional status he agrees with placement of J-tube, and to meet with cardiothoracic surgery to determine if she possibly would be a surgical candidate following chemoradiation.  We discussed the risks, benefits, short, and long term effects of radiotherapy, and the patient is interested in proceeding. Dr. Lisbeth Renshaw discusses the delivery and logistics of radiotherapy and anticipates a course of 5-1/2 weeks of radiotherapy.  She is interested in trying to get as much done as possible while she is in the hospital, so we will plan simulation on Thursday prior to her PET scan.  Provided that her PET scan can still be performed at the end of the week or early next week, we would anticipate a start date of 03/05/2020.  She is  in agreement with this plan. 2. Protein calorie malnutrition and weight loss secondary to #1.  The patient will be having a J-tube surgically placed with Dr. Dema Severin tomorrow.  We will follow along with her progress postoperatively.  Internal medicine is also aware of the hope for discharge as soon as medically able so that she can attend her PET imaging.  Given current concerns for patient exposure during the COVID-19 pandemic, this encounter was conducted via telephone.  The patient has provided two factor identification and has given verbal consent for this type of encounter and has been advised to only accept a meeting of this type in a secure network environment. The time spent during this encounter was 45 minutes including preparation, discussion, and coordination of the patient's care. The attendants for this meeting include Blenda Nicely, RN, Dr. Lisbeth Renshaw, Hayden Pedro  and Hart Carwin.  During the encounter,  Blenda Nicely, RN, Dr. Lisbeth Renshaw, and Hayden Pedro were located at [facility] Radiation Oncology Department.  KATIKA ELLINGSON was located at [facility] getting ready for admission.    The above documentation reflects my direct findings during this shared patient visit. Please see the separate note by Dr. Lisbeth Renshaw on this date for the remainder of the patient's plan of care.    Carola Rhine, PAC

## 2020-02-22 ENCOUNTER — Ambulatory Visit: Payer: Self-pay | Admitting: Radiation Oncology

## 2020-02-22 ENCOUNTER — Telehealth: Payer: Self-pay | Admitting: General Practice

## 2020-02-22 ENCOUNTER — Encounter (HOSPITAL_COMMUNITY)
Admission: RE | Disposition: A | Discharge status: Home Health | Payer: Self-pay | Source: Ambulatory Visit | Attending: Family Medicine

## 2020-02-22 ENCOUNTER — Inpatient Hospital Stay (HOSPITAL_COMMUNITY): Payer: Self-pay | Admitting: Certified Registered Nurse Anesthetist

## 2020-02-22 ENCOUNTER — Inpatient Hospital Stay: Payer: Self-pay | Admitting: General Practice

## 2020-02-22 ENCOUNTER — Encounter (HOSPITAL_COMMUNITY): Payer: Self-pay | Admitting: Internal Medicine

## 2020-02-22 HISTORY — PX: GASTROSTOMY: SHX5249

## 2020-02-22 LAB — CBC WITH DIFFERENTIAL/PLATELET
Abs Immature Granulocytes: 0.08 10*3/uL — ABNORMAL HIGH (ref 0.00–0.07)
Basophils Absolute: 0 10*3/uL (ref 0.0–0.1)
Basophils Relative: 1 %
Eosinophils Absolute: 0.2 10*3/uL (ref 0.0–0.5)
Eosinophils Relative: 2 %
HCT: 36.5 % (ref 36.0–46.0)
Hemoglobin: 12.5 g/dL (ref 12.0–15.0)
Immature Granulocytes: 1 %
Lymphocytes Relative: 33 %
Lymphs Abs: 2.6 10*3/uL (ref 0.7–4.0)
MCH: 36.5 pg — ABNORMAL HIGH (ref 26.0–34.0)
MCHC: 34.2 g/dL (ref 30.0–36.0)
MCV: 106.7 fL — ABNORMAL HIGH (ref 80.0–100.0)
Monocytes Absolute: 1.2 10*3/uL — ABNORMAL HIGH (ref 0.1–1.0)
Monocytes Relative: 15 %
Neutro Abs: 3.9 10*3/uL (ref 1.7–7.7)
Neutrophils Relative %: 48 %
Platelets: 174 10*3/uL (ref 150–400)
RBC: 3.42 MIL/uL — ABNORMAL LOW (ref 3.87–5.11)
RDW: 11.9 % (ref 11.5–15.5)
WBC: 8 10*3/uL (ref 4.0–10.5)
nRBC: 0 % (ref 0.0–0.2)

## 2020-02-22 LAB — GLUCOSE, CAPILLARY
Glucose-Capillary: 109 mg/dL — ABNORMAL HIGH (ref 70–99)
Glucose-Capillary: 184 mg/dL — ABNORMAL HIGH (ref 70–99)
Glucose-Capillary: 187 mg/dL — ABNORMAL HIGH (ref 70–99)

## 2020-02-22 LAB — BASIC METABOLIC PANEL
Anion gap: 9 (ref 5–15)
BUN: 8 mg/dL (ref 6–20)
CO2: 23 mmol/L (ref 22–32)
Calcium: 8.8 mg/dL — ABNORMAL LOW (ref 8.9–10.3)
Chloride: 103 mmol/L (ref 98–111)
Creatinine, Ser: 0.42 mg/dL — ABNORMAL LOW (ref 0.44–1.00)
GFR calc Af Amer: >60 mL/min (ref >60)
GFR calc non Af Amer: >60 mL/min (ref >60)
Glucose, Bld: 102 mg/dL — ABNORMAL HIGH (ref 70–99)
Potassium: 3.6 mmol/L (ref 3.5–5.1)
Sodium: 135 mmol/L (ref 135–145)

## 2020-02-22 SURGERY — INSERTION OF GASTROSTOMY TUBE
Anesthesia: General | Site: Abdomen

## 2020-02-22 SURGERY — LAPAROTOMY, EXPLORATORY
Anesthesia: General

## 2020-02-22 MED ORDER — BUPIVACAINE HCL (PF) 0.25 % IJ SOLN
INTRAMUSCULAR | Status: AC
  Filled 2020-02-22: qty 30

## 2020-02-22 MED ORDER — PHENYLEPHRINE 40 MCG/ML (10ML) SYRINGE FOR IV PUSH (FOR BLOOD PRESSURE SUPPORT)
PREFILLED_SYRINGE | INTRAVENOUS | Status: DC | PRN
  Administered 2020-02-22 (×2): 120 ug via INTRAVENOUS
  Administered 2020-02-22: 160 ug via INTRAVENOUS
  Administered 2020-02-22: 120 ug via INTRAVENOUS

## 2020-02-22 MED ORDER — FENTANYL CITRATE (PF) 100 MCG/2ML IJ SOLN
INTRAMUSCULAR | Status: DC | PRN
  Administered 2020-02-22: 150 ug via INTRAVENOUS
  Administered 2020-02-22: 100 ug via INTRAVENOUS

## 2020-02-22 MED ORDER — CEFAZOLIN SODIUM-DEXTROSE 2-3 GM-%(50ML) IV SOLR
INTRAVENOUS | Status: DC | PRN
Start: 2020-02-22 — End: 2020-02-22
  Administered 2020-02-22: 2 g via INTRAVENOUS

## 2020-02-22 MED ORDER — DEXMEDETOMIDINE HCL IN NACL 200 MCG/50ML IV SOLN
INTRAVENOUS | Status: AC
  Filled 2020-02-22: qty 50

## 2020-02-22 MED ORDER — 0.9 % SODIUM CHLORIDE (POUR BTL) OPTIME
TOPICAL | Status: DC | PRN
  Administered 2020-02-22: 1000 mL

## 2020-02-22 MED ORDER — ALBUMIN HUMAN 5 % IV SOLN: INTRAVENOUS | Status: DC | PRN

## 2020-02-22 MED ORDER — MIDAZOLAM HCL 2 MG/2ML IJ SOLN
INTRAMUSCULAR | Status: AC
  Filled 2020-02-22: qty 2

## 2020-02-22 MED ORDER — ROCURONIUM BROMIDE 10 MG/ML (PF) SYRINGE
PREFILLED_SYRINGE | INTRAVENOUS | Status: DC | PRN
  Administered 2020-02-22: 50 mg via INTRAVENOUS
  Administered 2020-02-22: 20 mg via INTRAVENOUS

## 2020-02-22 MED ORDER — FENTANYL CITRATE (PF) 250 MCG/5ML IJ SOLN
INTRAMUSCULAR | Status: AC
  Filled 2020-02-22: qty 5

## 2020-02-22 MED ORDER — SUGAMMADEX SODIUM 200 MG/2ML IV SOLN
INTRAVENOUS | Status: DC | PRN
  Administered 2020-02-22: 240 mg via INTRAVENOUS

## 2020-02-22 MED ORDER — LIDOCAINE 2% (20 MG/ML) 5 ML SYRINGE
INTRAMUSCULAR | Status: DC | PRN
  Administered 2020-02-22: 60 mg via INTRAVENOUS

## 2020-02-22 MED ORDER — ONDANSETRON HCL 4 MG/2ML IJ SOLN
INTRAMUSCULAR | Status: DC | PRN
  Administered 2020-02-22: 4 mg via INTRAVENOUS

## 2020-02-22 MED ORDER — DEXMEDETOMIDINE HCL IN NACL 200 MCG/50ML IV SOLN
INTRAVENOUS | Status: DC | PRN
Start: 2020-02-22 — End: 2020-02-22
  Administered 2020-02-22 (×2): 12 ug via INTRAVENOUS
  Administered 2020-02-22: 6 ug via INTRAVENOUS

## 2020-02-22 MED ORDER — PROPOFOL 10 MG/ML IV BOLUS
INTRAVENOUS | Status: DC | PRN
  Administered 2020-02-22: 110 mg via INTRAVENOUS

## 2020-02-22 MED ORDER — MIDAZOLAM HCL 2 MG/2ML IJ SOLN
INTRAMUSCULAR | Status: DC | PRN
  Administered 2020-02-22: 2 mg via INTRAVENOUS

## 2020-02-22 MED ORDER — FENTANYL CITRATE (PF) 100 MCG/2ML IJ SOLN: 25.0000 ug | INTRAMUSCULAR | Status: DC | PRN

## 2020-02-22 MED ORDER — PHENYLEPHRINE 40 MCG/ML (10ML) SYRINGE FOR IV PUSH (FOR BLOOD PRESSURE SUPPORT)
PREFILLED_SYRINGE | INTRAVENOUS | Status: AC
  Filled 2020-02-22: qty 20

## 2020-02-22 MED ORDER — DEXAMETHASONE SODIUM PHOSPHATE 10 MG/ML IJ SOLN
INTRAMUSCULAR | Status: DC | PRN
Start: 2020-02-22 — End: 2020-02-22
  Administered 2020-02-22: 8 mg via INTRAVENOUS

## 2020-02-22 MED ORDER — LACTATED RINGERS IV SOLN: INTRAVENOUS | Status: DC

## 2020-02-22 MED ORDER — STERILE WATER FOR IRRIGATION IR SOLN
Status: DC | PRN
  Administered 2020-02-22: 1000 mL

## 2020-02-22 SURGICAL SUPPLY — 39 items
CANISTER SUCT 3000ML PPV (MISCELLANEOUS) ×2 IMPLANT
CELLS DAT CNTRL 66122 CELL SVR (MISCELLANEOUS) ×1 IMPLANT
CHLORAPREP W/TINT 26 (MISCELLANEOUS) ×2 IMPLANT
COVER WAND RF STERILE (DRAPES) ×2 IMPLANT
DRAPE LAPAROSCOPIC ABDOMINAL (DRAPES) ×2 IMPLANT
DRSG OPSITE POSTOP 4X8 (GAUZE/BANDAGES/DRESSINGS) ×2 IMPLANT
DRSG TELFA 3X8 NADH (GAUZE/BANDAGES/DRESSINGS) ×2 IMPLANT
ELECT CAUTERY BLADE 6.4 (BLADE) ×2 IMPLANT
ELECT REM PT RETURN 9FT ADLT (ELECTROSURGICAL) ×2
ELECTRODE REM PT RTRN 9FT ADLT (ELECTROSURGICAL) ×1 IMPLANT
GAUZE SPONGE 4X4 12PLY STRL (GAUZE/BANDAGES/DRESSINGS) ×2 IMPLANT
GLOVE BIO SURGEON STRL SZ8 (GLOVE) ×4 IMPLANT
GLOVE BIOGEL PI IND STRL 6 (GLOVE) ×1 IMPLANT
GLOVE BIOGEL PI IND STRL 8 (GLOVE) ×1 IMPLANT
GLOVE BIOGEL PI INDICATOR 6 (GLOVE) ×1
GLOVE BIOGEL PI INDICATOR 8 (GLOVE) ×1
GLOVE SURG SS PI 6.0 STRL IVOR (GLOVE) ×2 IMPLANT
GOWN STRL REUS W/ TWL LRG LVL3 (GOWN DISPOSABLE) ×2 IMPLANT
GOWN STRL REUS W/ TWL XL LVL3 (GOWN DISPOSABLE) ×1 IMPLANT
GOWN STRL REUS W/TWL LRG LVL3 (GOWN DISPOSABLE) ×4
GOWN STRL REUS W/TWL XL LVL3 (GOWN DISPOSABLE) ×2
KIT BASIN OR (CUSTOM PROCEDURE TRAY) ×2 IMPLANT
KIT TURNOVER KIT B (KITS) ×2 IMPLANT
NS IRRIG 1000ML POUR BTL (IV SOLUTION) ×2 IMPLANT
PACK GENERAL/GYN (CUSTOM PROCEDURE TRAY) ×2 IMPLANT
PAD ARMBOARD 7.5X6 YLW CONV (MISCELLANEOUS) ×4 IMPLANT
PENCIL SMOKE EVACUATOR (MISCELLANEOUS) ×2 IMPLANT
RTRCTR WOUND ALEXIS 18CM MED (MISCELLANEOUS) ×2
STAPLER VISISTAT 35W (STAPLE) ×2 IMPLANT
SUT PDS AB 1 CT  36 (SUTURE) ×2
SUT PDS AB 1 CT 36 (SUTURE) ×1 IMPLANT
SUT PROLENE 2 0 CT2 30 (SUTURE) ×4 IMPLANT
SUT SILK 2 0 SH (SUTURE) ×4 IMPLANT
SUT SILK 2 0 SH CR/8 (SUTURE) ×6 IMPLANT
SYR 10ML LL (SYRINGE) ×2 IMPLANT
SYRINGE TOOMEY DISP (SYRINGE) ×2 IMPLANT
TOWEL GREEN STERILE (TOWEL DISPOSABLE) ×2 IMPLANT
TUBE J 18FR (TUBING) ×2 IMPLANT
WATER STERILE IRR 1000ML POUR (IV SOLUTION) ×2 IMPLANT

## 2020-02-22 NOTE — Anesthesia Postprocedure Evaluation (Signed)
Anesthesia Post Note  Patient: Natalie Morrison  Procedure(s) Performed: OPEN PLACEMENT JEJUNOSTOMY FEEDING TUBE (N/A Abdomen)     Patient location during evaluation: PACU Anesthesia Type: General Level of consciousness: awake and alert and oriented Pain management: pain level controlled Vital Signs Assessment: post-procedure vital signs reviewed and stable Respiratory status: spontaneous breathing, nonlabored ventilation and respiratory function stable Cardiovascular status: blood pressure returned to baseline Postop Assessment: no apparent nausea or vomiting Anesthetic complications: no    Last Vitals:  Vitals:   02/22/20 1200 02/22/20 1212  BP: 104/64 97/70  Pulse: 69 76  Resp: (!) 28 18  Temp: 36.7 C   SpO2: 99% 94%    Last Pain:  Vitals:   02/22/20 1212  TempSrc:   PainSc: [place]

## 2020-02-22 NOTE — Progress Notes (Signed)
Patient yellow [place],VS rechecked and patient stable now [place].

## 2020-02-22 NOTE — Telephone Encounter (Signed)
[place] CSW Progress Notes  Patient requests cancellation of todays social work appointment - left VM for patient asking her to call and let me know when she would like to reschedule.  Provided my contact information.  Edwyna Shell, LCSW Clinical Social Worker Phone:  [phone]

## 2020-02-22 NOTE — TOC Initial Note (Addendum)
Transition of Care [facility]) - Initial/Assessment Note    Patient Details  Name: Natalie Morrison MRN: [id number] Date of Birth: [birth year]  Transition [facility] CM/SW Contact:    Marilu Favre, RN Phone Number: 02/22/2020, 2:06 PM  Clinical Narrative:                 Patient from home with husband. Confirmed face sheet information. Patient uninsured.   Left message with Tillie Rung Revels [phone] with [facility] to call patient. Received an email Ms Revels will speak to patient.   Spoke to [place] with [facility] , they only do charity tube feeds for patients with bolus feeds. However patient has J tube and cannot do bolus feeds. Plan is for cyclic feeds. Zac will check with office to see cost and if they can provide if patient provides credit card number.   Spoke with [place] with [facility]. [place] checking to see if she can accpet for charity [facility] can accept for [facility]    Patient aware bedside nurse will teach her and her husband Tube feeds and J tube care prior to discharge.  Patient's PCP is Dr Carolann Littler , however since she has no insurance she is interested in following up at [place]. Will call for appointment. Scheduled appointment at [facility] March 12, 2020 at 1:30 pm  Will assist with DC prescriptions   with [facility]   Expected Discharge Plan: [place]     Patient Goals and CMS Choice Patient states their goals for this hospitalization and ongoing recovery are:: to return to home CMS Medicare.gov Compare Post Acute Care list provided to:: Patient Choice offered to / list presented to : Patient  Expected Discharge Plan and Services Expected Discharge Plan: [place]   Discharge Planning Services: CM Consult Post Acute Care Choice: [place] arrangements for the past 2 months: Single Family Home                 DME Arranged: Tube feeding DME Agency:  [facility] Date DME Agency Contacted: 02/22/20 Time DME Agency Contacted: [phone]   [place]: RN [place] Agency: [facility] Date [place] Agency Contacted: 02/22/20 Time [place]: 1403 Representative spoke with at [place]: [place] awaiting call back to see if  Prior Living Arrangements/Services Living arrangements for the past 2 months: [place] Lives with:: Spouse Patient language and need for interpreter reviewed:: Yes Do you feel safe going back to the place where you live?: Yes      Need for Family Participation in Patient Care: Yes (Comment) Care giver support system in place?: Yes (comment)   Criminal Activity/Legal Involvement Pertinent to Current Situation/Hospitalization: No - Comment as needed  Activities of Daily Living Home Assistive Devices/Equipment: Eyeglasses ADL Screening (condition at time of admission) Patient's cognitive ability adequate to safely complete daily activities?: Yes Is the patient deaf or have difficulty hearing?: No Does the patient have difficulty seeing, even when wearing glasses/contacts?: No Does the patient have difficulty concentrating, remembering, or making decisions?: No Patient able to express need for assistance with ADLs?: Yes Does the patient have difficulty dressing or bathing?: No Independently performs ADLs?: Yes (appropriate for developmental age)(very weak) Does the patient have difficulty walking or climbing stairs?: Yes(secondary to weakness) Weakness of Legs: Both Weakness of Arms/Hands: None  Permission Sought/Granted   Permission granted to share information with : No  Emotional Assessment Appearance:: Appears stated age Attitude/Demeanor/Rapport: Engaged Affect (typically observed): Accepting Orientation: : Oriented to Self, Oriented to Place, Oriented to  Time, Oriented to Situation Alcohol / Substance Use: Not Applicable Psych Involvement: No (comment)  Admission diagnosis:   Failure to thrive (0-17) [R62.51] Patient Active Problem List   Diagnosis Date Noted  . Failure to thrive (0-17) 02/20/2020  . 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 Sepsis ([place]) 02/22/2018  . Pressure injury of skin 02/22/2018  . Airway intubation performed without difficulty   . Sepsis due to pneumonia ([place]) 01/27/2018  . Acute respiratory failure with hypoxia ([place]) 01/27/2018  . Hypokalemia 01/27/2018  . Prolonged QT interval 01/27/2018  . Acute respiratory failure with hypoxia and hypercapnia ([place]) 01/27/2018  . Hypomagnesemia 01/27/2018  . Community acquired pneumonia   . Hyponatremia 06/23/2017  . Pancreatitis 10/30/2016  . Acute pancreatitis 10/30/2016  . Tobacco abuse 10/30/2016  . Moderate malnutrition ([place]) 12/03/2015  . Hypotension 09/23/2015  . Acute kidney injury ([place]) 09/23/2015  . Septic shock ([place]) 09/21/2015  . History of depression 06/14/2015  . Asthma exacerbation 04/07/2011  . HEMATURIA UNSPECIFIED 07/25/2009  . Alcohol abuse 06/26/2009  . ALLERGIC RHINITIS 06/26/2009  . Fatigue 06/26/2009  . Essential hypertension 06/28/2007   PCP:  Eulas Post, MD Pharmacy:   [id number] [place], [place] [id number] [place] [id number] Phone: [phone] Fax: [phone]  [facility] [place], [place] [address] [place] [address] Phone: [phone] Fax: [phone]     Social Determinants of Health (SDOH) Interventions    Readmission Risk Interventions No flowsheet data found.

## 2020-02-22 NOTE — Progress Notes (Signed)
Oncology brief note  I stopped by to see pt, and her husband was also in the room.  She underwent J-tube placement today, complains of pain at the incision.  Tube feeding will be started tonight, and rate will be titrated.  I again reviewed the plan of her cancer treatment with patient and her husband, questions were answered.  We will arrange follow-up in our office next week, PET will be rescheduled to next week.   Truitt Merle  02/22/2020

## 2020-02-22 NOTE — Anesthesia Preprocedure Evaluation (Addendum)
Anesthesia Evaluation  Patient identified by MRN, date of birth, ID band Patient awake    Reviewed: Allergy & Precautions, NPO status , Patient's Chart, lab work & pertinent test results  History of Anesthesia Complications Negative for: history of anesthetic complications  Airway Mallampati: II  TM Distance: >3 FB Neck ROM: Full    Dental no notable dental hx.    Pulmonary asthma , former smoker,    Pulmonary exam normal        Cardiovascular hypertension, Normal cardiovascular exam     Neuro/Psych Anxiety Depression negative neurological ROS     GI/Hepatic (+)     substance abuse  alcohol use, esophageal ca, chronic dysphagia    Endo/Other  negative endocrine ROS  Renal/GU negative Renal ROS  negative genitourinary   Musculoskeletal negative musculoskeletal ROS (+)   Abdominal   Peds  Hematology negative hematology ROS (+)   Anesthesia Other Findings Day of surgery medications reviewed with patient.  Reproductive/Obstetrics negative OB ROS                            Anesthesia Physical Anesthesia Plan  ASA: III  Anesthesia Plan: General   Post-op Pain Management:    Induction: Intravenous  PONV Risk Score and Plan: 4 or greater and Ondansetron, Dexamethasone, Treatment may vary due to age or medical condition and Midazolam  Airway Management Planned: Oral ETT  Additional Equipment: None  Intra-op Plan:   Post-operative Plan: Extubation in OR  Informed Consent: I have reviewed the patients History and Physical, chart, labs and discussed the procedure including the risks, benefits and alternatives for the proposed anesthesia with the patient or authorized representative who has indicated his/her understanding and acceptance.     Dental advisory given  Plan Discussed with: CRNA  Anesthesia Plan Comments:        Anesthesia Quick Evaluation

## 2020-02-22 NOTE — Progress Notes (Signed)
PROGRESS NOTE    Natalie Morrison  [id number] DOB: 1971/05/09 DOA: 02/20/2020 PCP: Eulas Post, MD   Brief Narrative: Natalie Morrison is a 49 y.o. female with medical history significant of esophageal squamous cell carcinoma, chronic alcohol abuse, chronic dysphagia/odynophagia/malnutrition, vitamin B12 deficiency who was sent from cancer center to [facility] with direct admission for placement of J-tube.  Patient been troubled with inability to tolerate solid food, weight loss, dysphagia/odynophagia due to her esophageal cancer.  Currently she is just taking clear liquid.  She complains of food getting stuck on her right chest.  She was seen by her oncologist Dr. Annamaria Boots at the cancer center and was sent for admission for placement of j-tube.  General surgery planning for placement of J-tube today.  Assessment & Plan:   Active Problems:   Failure to thrive (0-17)   Malnutrition/failure to thrive: Complains of odynophagia, dysphagia.  Tolerates only clear liquid diet.  Severely malnourished due to decreased oral intake.  Plan for J-tube placement.  General surgery following.  Will start on tube feeding after J tube placement. Reported 15 pounds weight loss recently.  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  EGD had shown partially obstructing tumor, biopsy had confirmed invasive squamous cell carcinoma.  She has enlarged lymph nodes lateral  to the GEJ.  She has locally advanced disease.  There is no evidence of distant metastasis. She complains of upper chest pain.  As per oncology,her staging PET scan is pending.  Currently there is plan to start on concurrent chemoradiation with Taxol and carboplatin in 2 weeks.  Chronic alcohol abuse: Continues to drink. Drinks 2 small glasses of hard liquor. Monitor for withdrawal, CIWA protocol.  Continue thiamine ,folic acid.  She has quit smoking 2 years ago.  History of vitamin B12 deficiency: Currently on vitamin  B12 supplements.  Currently not deficient.  Hypokalemia:Supplemented  Nutrition Problem: Moderate Malnutrition Etiology: chronic illness, cancer and cancer related treatments      DVT prophylaxis:SCD Code Status: Full Family Communication: Discussed with husband at bedside on 02/20/2020 Status is: Inpatient  Remains inpatient appropriate because:IV treatments appropriate due to intensity of illness or inability to take PO.  Patient is waiting for J-tube placement today.   Dispo: The patient is from: Home              Anticipated d/c is to: Home              Anticipated d/c date is: 1 day              Patient currently is not medically stable to d/c.     Consultants: General surgery  Procedures: None  Antimicrobials:  Anti-infectives (From admission, onward)   Start     Dose/Rate Route Frequency Ordered Stop   02/21/20 0600  ceFAZolin (ANCEF) IVPB 2g/100 mL premix     2 g 200 mL/hr over 30 Minutes Intravenous On call to O.R. 02/20/20 1229 02/22/20 0559   02/20/20 1145  cefTRIAXone (ROCEPHIN) 2 g in sodium chloride 0.9 % 100 mL IVPB  Status:  Discontinued     2 g 200 mL/hr over 30 Minutes Intravenous On call to O.R. 02/20/20 1141 02/20/20 1229   02/20/20 1145  metroNIDAZOLE (FLAGYL) IVPB 500 mg  Status:  Discontinued     500 mg 100 mL/hr over 60 Minutes Intravenous On call to O.R. 02/20/20 1141 02/20/20 1229      Subjective:  Patient seen and examined at the bedside this  morning.  Hemodynamically stable.  Comfortable.  No active issues at present.  She is just waiting for G-tube placement.  She is being transferred to [facility] for the surgery today.  She states she has been drinking Ensure.  Objective: Vitals:   02/21/20 0519 02/21/20 1418 02/21/20 2241 02/22/20 0549  BP: 132/88 (!) 130/93 (!) 141/96 (!) 162/98  Pulse: 83 77 75 75  Resp: 16 18 14 18   Temp: 98.1 F (36.7 C) 97.6 F (36.4 C) (!) 97.4 F (36.3 C) 97.9 F (36.6 C)  TempSrc: Oral Oral Oral Oral    SpO2: 97% 97% 96% 96%  Weight:      Height:       No intake or output data in the 24 hours ending 02/22/20 0739 Filed Weights   02/20/20 1150  Weight: 63.6 kg    Examination:  General exam: Appears calm and comfortable ,Not in distress HEENT:PERRL,Oral mucosa moist, Ear/Nose normal on gross exam Respiratory system: Bilateral equal air entry, normal vesicular breath sounds, no wheezes or crackles  Cardiovascular system: S1 & S2 heard, RRR. No JVD, murmurs, rubs, gallops or clicks. Gastrointestinal system: Abdomen is nondistended, soft and nontender. No organomegaly or masses felt. Normal bowel sounds heard. Central nervous system: Alert and oriented. No focal neurological deficits. Extremities: No edema, no clubbing ,no cyanosis Skin: No rashes, lesions or ulcers,no icterus ,no pallor  Data Reviewed: I have personally reviewed following labs and imaging studies  CBC: Recent Labs  Lab 02/15/20 0912 02/20/20 0911 02/21/20 0513 02/22/20 0535  WBC 9.3 9.3 7.0 8.0  NEUTROABS 4.9 4.5  --  3.9  HGB 14.8 13.4 12.0 12.5  HCT 41.8 38.3 35.2* 36.5  MCV 101.0* 101.9* 105.4* 106.7*  PLT 232 191 168 [id number]   Basic Metabolic Panel: Recent Labs  Lab 02/15/20 0912 02/20/20 0911 02/21/20 0513 02/22/20 0535  NA 138 140 137 135  K 2.7* 3.2* 3.1* 3.6  CL 95* 92* 99 103  CO2 21* 31 27 23   GLUCOSE 112* 139* 102* 102*  BUN 7 13 12 8   CREATININE 0.94 0.77 0.60 0.42*  CALCIUM 10.2 10.2 8.6* 8.8*   GFR: Estimated Creatinine Clearance: 75.7 mL/min (A) (by C-G formula based on SCr of 0.42 mg/dL (L)). Liver Function Tests: Recent Labs  Lab 02/15/20 0912 02/20/20 0911  AST 34 44*  ALT 69* 65*  ALKPHOS 64 64  BILITOT 0.9 0.8  PROT 7.5 6.9  ALBUMIN 3.9 3.6   No results for input(s): LIPASE, AMYLASE in the last 168 hours. No results for input(s): AMMONIA in the last 168 hours. Coagulation Profile: No results for input(s): INR, PROTIME in the last 168 hours. Cardiac Enzymes: No  results for input(s): CKTOTAL, CKMB, CKMBINDEX, TROPONINI in the last 168 hours. BNP (last 3 results) No results for input(s): PROBNP in the last 8760 hours. HbA1C: No results for input(s): HGBA1C in the last 72 hours. CBG: No results for input(s): GLUCAP in the last 168 hours. Lipid Profile: No results for input(s): CHOL, HDL, LDLCALC, TRIG, CHOLHDL, LDLDIRECT in the last 72 hours. Thyroid Function Tests: No results for input(s): TSH, T4TOTAL, FREET4, T3FREE, THYROIDAB in the last 72 hours. Anemia Panel: No results for input(s): VITAMINB12, FOLATE, FERRITIN, TIBC, IRON, RETICCTPCT in the last 72 hours. Sepsis Labs: No results for input(s): PROCALCITON, LATICACIDVEN in the last 168 hours.  Recent Results (from the past 240 hour(s))  SARS Coronavirus 2 by RT PCR (hospital order, performed in [facility] hospital lab) Nasopharyngeal Nasopharyngeal Swab  Status: None   Collection Time: 02/20/20  5:28 PM   Specimen: Nasopharyngeal Swab  Result Value Ref Range Status   SARS Coronavirus 2 NEGATIVE NEGATIVE Final    Comment: (NOTE) SARS-CoV-2 target nucleic acids are NOT DETECTED. The SARS-CoV-2 RNA is generally detectable in upper and lower respiratory specimens during the acute phase of infection. The lowest concentration of SARS-CoV-2 viral copies this assay can detect is 250 copies / mL. A negative result does not preclude SARS-CoV-2 infection and should not be used as the sole basis for treatment or other patient management decisions.  A negative result may occur with improper specimen collection / handling, submission of specimen other than nasopharyngeal swab, presence of viral mutation(s) within the areas targeted by this assay, and inadequate number of viral copies (<250 copies / mL). A negative result must be combined with clinical observations, patient history, and epidemiological information. Fact Sheet for Patients:   StrictlyIdeas.no Fact Sheet  for Healthcare Providers: BankingDealers.co.za This test is not yet approved or cleared  by the [place] FDA and has been authorized for detection and/or diagnosis of SARS-CoV-2 by FDA under an Emergency Use Authorization (EUA).  This EUA will remain in effect (meaning this test can be used) for the duration of the COVID-19 declaration under Section 564(b)(1) of the Act, 21 U.S.C. section 360bbb-3(b)(1), unless the authorization is terminated or revoked sooner. Performed at [facility], [place] [address], [place], [place] [address]          Radiology Studies: No results found.      Scheduled Meds: . bupivacaine liposome  20 mL Infiltration Once  . Chlorhexidine Gluconate Cloth  6 each Topical Once   And  . Chlorhexidine Gluconate Cloth  6 each Topical Once  . feeding supplement (OSMOLITE 1.5 CAL)  1,000 mL Per Tube Q24H  . folic acid  1 mg Oral Daily  . multivitamin with minerals  1 tablet Oral Daily  . thiamine  100 mg Oral Daily   Continuous Infusions: . sodium chloride 75 mL/hr at 02/21/20 0401     LOS: 2 days    Time spent:25 mins, More than 50% of that time was spent in counseling and/or coordination of care.      Shelly Coss, MD [facility] P5/20/2021, 7:39 AM

## 2020-02-22 NOTE — Progress Notes (Signed)
Patient seen and examined. Informed consent was obtained after detailed explanation of risks, including bleeding, infection, and abscess. All questions answered to the patient's satisfaction. Husband, Fara Olden, to be contacted post-op.   Jesusita Oka, MD General and [facility]

## 2020-02-22 NOTE — Transfer of Care (Signed)
Immediate Anesthesia Transfer of Care Note  Patient: Natalie Morrison  Procedure(s) Performed: OPEN PLACEMENT JEJUNOSTOMY FEEDING TUBE (N/A Abdomen)  Patient Location: PACU  Anesthesia Type:General  Level of Consciousness: drowsy  Airway & Oxygen Therapy: Patient Spontanous Breathing and Patient connected to nasal cannula oxygen  Post-op Assessment: Report given to RN and Post -op Vital signs reviewed and stable  Post vital signs: Reviewed and stable  Last Vitals:  Vitals Value Taken Time  BP 104/64 02/22/20 1156  Temp    Pulse 69 02/22/20 1158  Resp 30 02/22/20 1158  SpO2 100 % 02/22/20 1158  Vitals shown include unvalidated device data.  Last Pain:  Vitals:   02/22/20 0847  TempSrc:   PainSc: 7       Patients Stated Pain Goal: 4 ([id number])  Complications: No apparent anesthesia complications

## 2020-02-22 NOTE — Anesthesia Procedure Notes (Addendum)
Procedure Name: Intubation Date/Time: 02/22/2020 10:26 AM Performed by: Leonor Liv, CRNA Pre-anesthesia Checklist: Patient identified, Emergency Drugs available, Suction available and Patient being monitored Patient Re-evaluated:Patient Re-evaluated prior to induction Oxygen Delivery Method: Circle System Utilized Preoxygenation: Pre-oxygenation with 100% oxygen Induction Type: IV induction Ventilation: Mask ventilation without difficulty Laryngoscope Size: Mac and 3 Grade View: Grade II Tube type: Oral Tube size: 7.0 mm Number of attempts: 1 Airway Equipment and Method: Stylet and Oral airway Placement Confirmation: ETT inserted through vocal cords under direct vision,  positive ETCO2 and breath sounds checked- equal and bilateral Tube secured with: Tape Dental Injury: Teeth and Oropharynx as per pre-operative assessment

## 2020-02-22 NOTE — Progress Notes (Signed)
Patient seen and examined. Informed consent was obtained after detailed explanation of risks, including bleeding, infection, and abscess. All questions answered to the patient's satisfaction.   Jesusita Oka, MD General and [facility]

## 2020-02-22 NOTE — Progress Notes (Signed)
Patient arrived from PACU via stretcher. Patient is alert and oriented and complains of 9/10 pain. Orders released. Honeycomb dressing to midline incision with scant drainage unchanged from OR. Bed is in the lowest and locked position with bed rails up times 2. Belongings and call bell within reach.

## 2020-02-22 NOTE — Progress Notes (Signed)
Subjective No acute events. Remains ready for surgery  Objective: Vital signs in last 24 hours: Temp:  [97.4 F (36.3 C)-97.9 F (36.6 C)] 97.9 F (36.6 C) (05/20 0549) Pulse Rate:  [75-77] 75 (05/20 0549) Resp:  [14-18] 18 (05/20 0549) BP: (130-162)/(93-98) 162/98 (05/20 0549) SpO2:  [96 %-97 %] 96 % (05/20 0549) Last BM Date: 02/19/20  Intake/Output from previous day: No intake/output data recorded. Intake/Output this shift: No intake/output data recorded.  Gen: NAD, comfortable CV: RRR Pulm: Normal work of breathing Abd: Soft, NT/ND Ext: SCDs in place  Lab Results: CBC  Recent Labs    02/21/20 0513 02/22/20 0535  WBC 7.0 8.0  HGB 12.0 12.5  HCT 35.2* 36.5  PLT 168 174   BMET Recent Labs    02/21/20 0513 02/22/20 0535  NA 137 135  K 3.1* 3.6  CL 99 103  CO2 27 23  GLUCOSE 102* 102*  BUN 12 8  CREATININE 0.60 0.42*  CALCIUM 8.6* 8.8*   PT/INR No results for input(s): LABPROT, INR in the last 72 hours. ABG No results for input(s): PHART, HCO3 in the last 72 hours.  Invalid input(s): PCO2, PO2  Studies/Results:  Anti-infectives: Anti-infectives (From admission, onward)   Start     Dose/Rate Route Frequency Ordered Stop   02/21/20 0600  ceFAZolin (ANCEF) IVPB 2g/100 mL premix     2 g 200 mL/hr over 30 Minutes Intravenous On call to O.R. 02/20/20 1229 02/22/20 0559   02/20/20 1145  cefTRIAXone (ROCEPHIN) 2 g in sodium chloride 0.9 % 100 mL IVPB  Status:  Discontinued     2 g 200 mL/hr over 30 Minutes Intravenous On call to O.R. 02/20/20 1141 02/20/20 1229   02/20/20 1145  metroNIDAZOLE (FLAGYL) IVPB 500 mg  Status:  Discontinued     500 mg 100 mL/hr over 60 Minutes Intravenous On call to O.R. 02/20/20 1141 02/20/20 1229       Assessment/Plan: Patient Active Problem List   Diagnosis Date Noted  . Failure to thrive (0-17) 02/20/2020  . Primary squamous cell carcinoma of lower third of esophagus ([place]) 02/14/2020  . Sepsis ([place]) 02/22/2018  .  Pressure injury of skin 02/22/2018  . Airway intubation performed without difficulty   . Sepsis due to pneumonia ([place]) 01/27/2018  . Acute respiratory failure with hypoxia ([place]) 01/27/2018  . Hypokalemia 01/27/2018  . Prolonged QT interval 01/27/2018  . Acute respiratory failure with hypoxia and hypercapnia ([place]) 01/27/2018  . Hypomagnesemia 01/27/2018  . Community acquired pneumonia   . Hyponatremia 06/23/2017  . Pancreatitis 10/30/2016  . Acute pancreatitis 10/30/2016  . Tobacco abuse 10/30/2016  . Moderate malnutrition ([place]) 12/03/2015  . Hypotension 09/23/2015  . Acute kidney injury ([place]) 09/23/2015  . Septic shock ([place]) 09/21/2015  . History of depression 06/14/2015  . Asthma exacerbation 04/07/2011  . HEMATURIA UNSPECIFIED 07/25/2009  . Alcohol abuse 06/26/2009  . ALLERGIC RHINITIS 06/26/2009  . Fatigue 06/26/2009  . Essential hypertension 06/28/2007   Natalie Morrison is a very pleasant 82yoF with hx of GERD, EtOH use, asthma, prior alcohol withdrawal symptoms, found to have esophageal mass on workup for solid food dysphagia. She underwent workup and EGD 02/08/20 revealed mass in the mid esophagus that was biopsied and revealed squamous cell carcinoma.  She underwent staging CT C/A/P 02/14/20 which showed mass in the distal esophagus with mildly enlarged lymph nodes lateral to GE jxn and in gastrohepatic ligament - which in this territory are suspicious for metastatic disease. PET Scan is pending.  -  We unfortunately have had more surgical emergencies that will potentially result in further delays in her surgery - we therefore discussed her case with my partner at [facility] - Dr. Bobbye Morton whom currently has surgical availability today to expedite her care. I discussed with Natalie Morrison proceeding to [facility] via our [facility] service for these reasons. We have previously gone over everything regarding her surgical plan as it pertains to open placement of Jejunostomy feeding tube - see note from  02/20/20. She agrees to proceed as planned. She is aware that she will be meeting my partner at [facility] whom will be performing her surgery as well   LOS: 2 days   Sharon Mt. Dema Severin, M.D. [facility] Use [email or web address] to contact on call provider

## 2020-02-22 NOTE — Op Note (Signed)
   Operative Note   Date: 02/22/2020  Procedure: open jejunostomy tube placement  Pre-op diagnosis: Esophageal cancer Post-op diagnosis: Same  Indication and clinical history: The patient is a 49 y.o. year old female with esophageal cancer, here for jejunostomy tube in anticipation of neoadjuvant therapy for treatment.     Surgeon: Jesusita Oka, MD Assistant: Grandville Silos, MD  Anesthesia: General  Findings:  . Specimen: Mesenteric nodules . EBL: <10cc . Drains/Implants:  Jejunostomy tube  Disposition: PACU - hemodynamically stable.  Description of procedure: The patient was positioned supine on the operating room table. General anesthetic induction and intubation were uneventful. Time-out was performed verifying correct patient, procedure, signature of informed consent, and administration of pre-operative antibiotics. The patient was prepped and draped in the usual sterile fashion.  An upper midline incision was made and deepened down through the fascia. The abdominal cavity was entered sharply and a serosal injury was made to the colon. This was repaired by oversewing the defect with 3 silk sutures in a Lembert fashion. The abdomen was explored and just prior to identification of the ligament of Treitz some tiny nodules were identified at the base of the small bowel mesentery. This was sent as a frozen specimen and was confirmed by the pathologist to be areas of fibrosis. The ligament of Treitz was then identified and approximately 30 cm distal to this a pursestring suture was placed in the antimesenteric border of the jejunum. The jejunum was entered using electrocautery and a jejunostomy tube passed through the left upper quadrant of the abdominal wall and then fed distally through jejunotomy. The jejunostomy entrance site was secured with the pursestrings and a Witzel tunnel created approximately 5 cm proximal to this site using silk suture. The Witzel tunnel was then secured to the  abdominal wall with silk suture. The abdomen was irrigated and the fascia closed with #1 PDS suture in a running fashion. The skin was closed with staples.  Sterile dressings were applied. All sponge and instrument counts were correct at the conclusion of the procedure. The patient was awakened from anesthesia, extubated uneventfully, and transported to the PACU in good condition. There were no complications.    Jesusita Oka, MD General and [place] [facility]

## 2020-02-22 NOTE — Progress Notes (Signed)
Pt being transferred to [facility] for a procedure. MD came to talk with the patient about the transfer and the patient agreed to go to [facility]. [facility] is here to transport the patient.

## 2020-02-23 ENCOUNTER — Encounter (HOSPITAL_COMMUNITY): Admission: RE | Admit: 2020-02-23 | Payer: Self-pay | Source: Ambulatory Visit

## 2020-02-23 ENCOUNTER — Other Ambulatory Visit: Payer: Self-pay | Admitting: Nurse Practitioner

## 2020-02-23 DIAGNOSIS — C154 Malignant neoplasm of middle third of esophagus: Secondary | ICD-10-CM

## 2020-02-23 LAB — COMPREHENSIVE METABOLIC PANEL
ALT: 34 U/L (ref 0–44)
AST: 22 U/L (ref 15–41)
Albumin: 2.8 g/dL — ABNORMAL LOW (ref 3.5–5.0)
Alkaline Phosphatase: 49 U/L (ref 38–126)
Anion gap: 9 (ref 5–15)
BUN: 7 mg/dL (ref 6–20)
CO2: 24 mmol/L (ref 22–32)
Calcium: 9.1 mg/dL (ref 8.9–10.3)
Chloride: 105 mmol/L (ref 98–111)
Creatinine, Ser: 0.7 mg/dL (ref 0.44–1.00)
GFR calc Af Amer: >60 mL/min (ref >60)
GFR calc non Af Amer: >60 mL/min (ref >60)
Glucose, Bld: 149 mg/dL — ABNORMAL HIGH (ref 70–99)
Potassium: 3.6 mmol/L (ref 3.5–5.1)
Sodium: 138 mmol/L (ref 135–145)
Total Bilirubin: 0.6 mg/dL (ref 0.3–1.2)
Total Protein: 6 g/dL — ABNORMAL LOW (ref 6.5–8.1)

## 2020-02-23 LAB — GLUCOSE, CAPILLARY
Glucose-Capillary: 150 mg/dL — ABNORMAL HIGH (ref 70–99)
Glucose-Capillary: 163 mg/dL — ABNORMAL HIGH (ref 70–99)
Glucose-Capillary: 164 mg/dL — ABNORMAL HIGH (ref 70–99)
Glucose-Capillary: 167 mg/dL — ABNORMAL HIGH (ref 70–99)
Glucose-Capillary: 171 mg/dL — ABNORMAL HIGH (ref 70–99)
Glucose-Capillary: 222 mg/dL — ABNORMAL HIGH (ref 70–99)

## 2020-02-23 LAB — MAGNESIUM: Magnesium: 1.5 mg/dL — ABNORMAL LOW (ref 1.7–2.4)

## 2020-02-23 LAB — PHOSPHORUS: Phosphorus: 2.7 mg/dL (ref 2.5–4.6)

## 2020-02-23 LAB — HEMOGLOBIN A1C
Hgb A1c MFr Bld: 5.2 % (ref 4.8–5.6)
Mean Plasma Glucose: 102.54 mg/dL

## 2020-02-23 LAB — SURGICAL PATHOLOGY

## 2020-02-23 MED ORDER — INSULIN ASPART 100 UNIT/ML ~~LOC~~ SOLN
0.0000 [IU] | SUBCUTANEOUS | Status: DC
  Administered 2020-02-23 (×3): 3 [IU] via SUBCUTANEOUS
  Administered 2020-02-23: 2 [IU] via SUBCUTANEOUS
  Administered 2020-02-23: 5 [IU] via SUBCUTANEOUS
  Administered 2020-02-23: 3 [IU] via SUBCUTANEOUS
  Administered 2020-02-24 (×4): 2 [IU] via SUBCUTANEOUS
  Administered 2020-02-24 – 2020-02-25 (×5): 3 [IU] via SUBCUTANEOUS
  Administered 2020-02-25 (×2): 2 [IU] via SUBCUTANEOUS
  Administered 2020-02-26: 3 [IU] via SUBCUTANEOUS
  Administered 2020-02-26 (×3): 2 [IU] via SUBCUTANEOUS

## 2020-02-23 MED ORDER — FREE WATER
100.0000 mL | Status: DC
  Administered 2020-02-23 – 2020-02-27 (×19): 100 mL

## 2020-02-23 MED ORDER — THIAMINE HCL 100 MG PO TABS
100.0000 mg | ORAL_TABLET | Freq: Every day | ORAL | Status: DC
  Administered 2020-02-24 – 2020-02-27 (×4): 100 mg via JEJUNOSTOMY
  Filled 2020-02-23 (×4): qty 1

## 2020-02-23 MED ORDER — FOLIC ACID 1 MG PO TABS
1.0000 mg | ORAL_TABLET | Freq: Every day | ORAL | Status: DC
  Administered 2020-02-24 – 2020-02-27 (×4): 1 mg
  Filled 2020-02-23 (×4): qty 1

## 2020-02-23 MED ORDER — OXYCODONE HCL 5 MG/5ML PO SOLN: 5.0000 mg | Freq: Four times a day (QID) | ORAL | Status: DC | PRN

## 2020-02-23 MED ORDER — SIMETHICONE 40 MG/0.6ML PO SUSP
40.0000 mg | Freq: Four times a day (QID) | ORAL | Status: DC | PRN
  Filled 2020-02-23: qty 0.6

## 2020-02-23 MED ORDER — INSULIN ASPART 100 UNIT/ML ~~LOC~~ SOLN: 0.0000 [IU] | SUBCUTANEOUS | Status: DC

## 2020-02-23 MED ORDER — ACETAMINOPHEN 160 MG/5ML PO SOLN
650.0000 mg | Freq: Four times a day (QID) | ORAL | Status: DC
  Administered 2020-02-23 – 2020-02-27 (×15): 650 mg
  Filled 2020-02-23 (×16): qty 20.3

## 2020-02-23 MED ORDER — ONDANSETRON HCL 4 MG/2ML IJ SOLN
4.0000 mg | Freq: Three times a day (TID) | INTRAMUSCULAR | Status: DC | PRN
  Administered 2020-02-23 – 2020-02-26 (×5): 4 mg via INTRAVENOUS
  Filled 2020-02-23 (×5): qty 2

## 2020-02-23 MED ORDER — OSMOLITE 1.5 CAL PO LIQD
1000.0000 mL | ORAL | Status: DC
  Administered 2020-02-23 – 2020-02-26 (×4): 1000 mL
  Filled 2020-02-23 (×8): qty 1000

## 2020-02-23 NOTE — Progress Notes (Signed)
PROGRESS NOTE  Natalie Morrison  [id number] DOB: 02-08-1971 DOA: 02/20/2020 PCP: Eulas Post, MD   Brief Narrative: Dan Humphreys a 49 y.o.femalewith a history of esophageal squamous cell carcinoma, chronic alcohol abuse, chronic dysphagia/odynophagia/malnutrition, vitamin B12 deficiency who was sent from cancer center to [facility] with direct admission for placement ofJ-tube due to worsening po intake. General surgery requested transfer to [facility] and placed J-tube 5/20. Feeds have been started and are being slowly increased. Slow progression due to nausea.  Assessment & Plan: Active Problems:   Failure to thrive (0-17)  Moderate protein calorie malnutrition and failure to thrive:  - s/p jejunostomy tube placement 5/20 and starting continuous tube feeds per RD recommendations. Stalled at 29ml/hr this AM with nausea which we will treat with antiemetics.  - Free water flushes, stop IVF once rate is adequate. - Continue vitamins/minerals.  - Will monitor K, Mg, Phos due to risk of refeeding syndrome - CM arranging tube feed education and providing this at home at discharge. Scheduled follow up for patient for staple removal and follow up with Dr. Bobbye Morton  Squamous cell esophageal cancer:Affecting mid esophagus. EGD had shown partially obstructing tumor, biopsy had confirmed invasive squamous cell carcinoma, locally advanced disease with enlarged lymph nodes lateralto the GEJ - Staging PET scan is pending.  - Plan to start on concurrent chemoradiation with Taxol and carboplatin in 2 weeks.  Chronic alcohol abuse: Continues to drink.Drinks 2 small glasses of hard liquor. - CIWA in place though no evidence of withdrawal. We can DC this.   History of vitamin B12 deficiency: Currently not deficient. - Currently on vitamin B12 supplements.   Hypokalemia: Supplemented  RN Pressure Injury Documentation: Pressure Injury 02/22/18 Unstageable - Full thickness  tissue loss in which the base of the ulcer is covered by slough (yellow, tan, gray, green or brown) and/or eschar (tan, brown or black) in the wound bed. thin strip of open area between buttocks (Active)  02/22/18 0600  Location: Sacrum  Location Orientation: Medial  Staging: Unstageable - Full thickness tissue loss in which the base of the ulcer is covered by slough (yellow, tan, gray, green or brown) and/or eschar (tan, brown or black) in the wound bed.  Wound Description (Comments): thin strip of open area between buttocks  Present on Admission: Yes   DVT prophylaxis: SCDs, if hgb stable and no further bleeding from j-tube site, would start prophylactic anticoagulation due to risk of DVT. Code Status: Full Family Communication: Husband at bedside Disposition Plan:  Status is: Inpatient  Remains inpatient appropriate because:IV treatments appropriate due to intensity of illness or inability to take PO and Requires titration of tube feeds, monitoring for refeeding syndrome   Dispo: The patient is from: Home              Anticipated d/c is to: Home              Anticipated d/c date is: 2 days              Patient currently is not medically stable to d/c.  Consultants:   Oncology  General surgery  Procedures:   Jejunostomy tube placement 02/22/2020  Antimicrobials:  None   Subjective: Abdominal tenderness throughout, constant, worse with palpation. Nausea was severe when starting tube feeds, currently stopped this AM. Later restarted with zofran support, at 86ml/hr this PM.  Objective: Vitals:   02/23/20 0021 02/23/20 0402 02/23/20 0408 02/23/20 1425  BP: (!) 139/91 (!) 157/101 (!) 150/95 Marland Kitchen)  154/92  Pulse: 93 91  81  Resp: 18 18  17   Temp: 98.2 F (36.8 C) 98.5 F (36.9 C)  98.6 F (37 C)  TempSrc: Oral Oral  Oral  SpO2: 95% 97%  98%  Weight:      Height:        Intake/Output Summary (Last 24 hours) at 02/23/2020 1719 Last data filed at 02/23/2020 1300 Gross per  24 hour  Intake 1127.16 ml  Output --  Net 1127.16 ml   Filed Weights   02/20/20 1150 02/22/20 0849  Weight: 63.6 kg 63.6 kg    Gen: Frail female in some discomfort Pulm: Non-labored breathing room air. Clear to auscultation bilaterally.  CV: Regular rate and rhythm. No murmur, rub, or gallop. No JVD, no pedal edema. GI: Abdomen soft, diffusely tender with mild bleeding from J tube site, midline incision c/d/i, non-distended, with normoactive bowel sounds. No organomegaly or masses felt. Ext: Warm, no deformities Skin: As above, otherwise no rashes, lesions or ulcers Neuro: Alert and oriented. No focal neurological deficits. Psych: Judgement and insight appear normal. Mood & affect appropriate.   Data Reviewed: I have personally reviewed following labs and imaging studies  CBC: Recent Labs  Lab 02/20/20 0911 02/21/20 0513 02/22/20 0535  WBC 9.3 7.0 8.0  NEUTROABS 4.5  --  3.9  HGB 13.4 12.0 12.5  HCT 38.3 35.2* 36.5  MCV 101.9* 105.4* 106.7*  PLT 191 168 [id number]   Basic Metabolic Panel: Recent Labs  Lab 02/20/20 0911 02/21/20 0513 02/22/20 0535 02/23/20 1017  NA 140 137 135 138  K 3.2* 3.1* 3.6 3.6  CL 92* 99 103 105  CO2 31 27 23 24   GLUCOSE 139* 102* 102* 149*  BUN 13 12 8 7   CREATININE 0.77 0.60 0.42* 0.70  CALCIUM 10.2 8.6* 8.8* 9.1  MG  --   --   --  1.5*  PHOS  --   --   --  2.7   GFR: Estimated Creatinine Clearance: 75.7 mL/min (by C-G formula based on SCr of 0.7 mg/dL). Liver Function Tests: Recent Labs  Lab 02/20/20 0911 02/23/20 1017  AST 44* 22  ALT 65* 34  ALKPHOS 64 49  BILITOT 0.8 0.6  PROT 6.9 6.0*  ALBUMIN 3.6 2.8*   No results for input(s): LIPASE, AMYLASE in the last 168 hours. No results for input(s): AMMONIA in the last 168 hours. Coagulation Profile: No results for input(s): INR, PROTIME in the last 168 hours. Cardiac Enzymes: No results for input(s): CKTOTAL, CKMB, CKMBINDEX, TROPONINI in the last 168 hours. BNP (last 3  results) No results for input(s): PROBNP in the last 8760 hours. HbA1C: Recent Labs    02/23/20 0208  HGBA1C 5.2   CBG: Recent Labs  Lab 02/23/20 0022 02/23/20 0357 02/23/20 0759 02/23/20 1204 02/23/20 1615  GLUCAP 222* 164* 171* 150* 167*   Lipid Profile: No results for input(s): CHOL, HDL, LDLCALC, TRIG, CHOLHDL, LDLDIRECT in the last 72 hours. Thyroid Function Tests: No results for input(s): TSH, T4TOTAL, FREET4, T3FREE, THYROIDAB in the last 72 hours. Anemia Panel: No results for input(s): VITAMINB12, FOLATE, FERRITIN, TIBC, IRON, RETICCTPCT in the last 72 hours. Urine analysis:    Component Value Date/Time   COLORURINE STRAW (A) 02/22/2018 0300   APPEARANCEUR CLEAR 02/22/2018 0300   LABSPEC 1.003 (L) 02/22/2018 0300   PHURINE 5.0 02/22/2018 0300   GLUCOSEU NEGATIVE 02/22/2018 0300   HGBUR NEGATIVE 02/22/2018 0300   HGBUR negative 01/01/2010 1340   BILIRUBINUR NEGATIVE 02/22/2018  0300   BILIRUBINUR 3+ 03/18/2016 1614   KETONESUR NEGATIVE 02/22/2018 0300   PROTEINUR NEGATIVE 02/22/2018 0300   UROBILINOGEN 2.0 03/18/2016 1614   UROBILINOGEN 0.2 05/06/2013 1440   NITRITE NEGATIVE 02/22/2018 0300   LEUKOCYTESUR NEGATIVE 02/22/2018 0300   Recent Results (from the past 240 hour(s))  SARS Coronavirus 2 by RT PCR (hospital order, performed in [facility] hospital lab) Nasopharyngeal Nasopharyngeal Swab     Status: None   Collection Time: 02/20/20  5:28 PM   Specimen: Nasopharyngeal Swab  Result Value Ref Range Status   SARS Coronavirus 2 NEGATIVE NEGATIVE Final    Comment: (NOTE) SARS-CoV-2 target nucleic acids are NOT DETECTED. The SARS-CoV-2 RNA is generally detectable in upper and lower respiratory specimens during the acute phase of infection. The lowest concentration of SARS-CoV-2 viral copies this assay can detect is 250 copies / mL. A negative result does not preclude SARS-CoV-2 infection and should not be used as the sole basis for treatment or  other patient management decisions.  A negative result may occur with improper specimen collection / handling, submission of specimen other than nasopharyngeal swab, presence of viral mutation(s) within the areas targeted by this assay, and inadequate number of viral copies (<250 copies / mL). A negative result must be combined with clinical observations, patient history, and epidemiological information. Fact Sheet for Patients:   StrictlyIdeas.no Fact Sheet for Healthcare Providers: BankingDealers.co.za This test is not yet approved or cleared  by the [place] FDA and has been authorized for detection and/or diagnosis of SARS-CoV-2 by FDA under an Emergency Use Authorization (EUA).  This EUA will remain in effect (meaning this test can be used) for the duration of the COVID-19 declaration under Section 564(b)(1) of the Act, 21 U.S.C. section 360bbb-3(b)(1), unless the authorization is terminated or revoked sooner. Performed at [facility], [place] [address], [place], [place] [address]       Radiology Studies: No results found.  Scheduled Meds: . acetaminophen (TYLENOL) oral liquid 160 mg/5 mL  650 mg Per Tube Q6H  . bupivacaine liposome  20 mL Infiltration Once  . Chlorhexidine Gluconate Cloth  6 each Topical Once   And  . Chlorhexidine Gluconate Cloth  6 each Topical Once  . folic acid  1 mg Oral Daily  . free water  100 mL Per Tube Q4H  . insulin aspart  0-15 Units Subcutaneous Q4H  . multivitamin with minerals  1 tablet Oral Daily  . thiamine  100 mg Oral Daily   Continuous Infusions: . sodium chloride 75 mL/hr at 02/23/20 0437  . feeding supplement (OSMOLITE 1.5 CAL) 1,000 mL (02/23/20 1209)  . lactated ringers       LOS: 3 days   Time spent: 25 minutes.  Patrecia Pour, MD [facility] [email or web address] 02/23/2020, 5:19 PM

## 2020-02-23 NOTE — Care Management (Addendum)
[place] with [facility] can provide [place] care.   Zac with [facility] can provide tube feeding for charity and will deliver to hospital room.   Spoke to patient and husband at bedside explained above.   Discussed nurse at hospital will provide J tube and tube feeding teaching prior to discharge.    Patient has appointment at [facility] on June 8 which is on AV[facility]. [facility] also has a pharmacy.   Explained MATCH program.   Elie Confer with [facility] will be in touch regarding medicaid / disability.   Natalie Morrison

## 2020-02-23 NOTE — Progress Notes (Signed)
Nutrition Follow-up  RD working remotely.  DOCUMENTATION CODES:   Non-severe (moderate) malnutrition in context of chronic illness  INTERVENTION:   -Continue Osmolite 1.5 @ 20 ml/hr via j-tube and increase by 10 ml every 8 hours to goal rate of 60 ml/hr.   Tube feeding regimen provides 2160 kcal (100% of needs), 90 grams of protein, and 1097 ml of H2O.   TF recommendations for home:   -Nocturnal feedings of Osmolite 1.5 @ 90 ml/hr via j-tube over 16 hour period  100 ml free water flush every 4 hours  Regimen will provides 2160 kcals, 90 grams protein, and 1697 ml free water daily, meeting 100% of estimated kcal and protein needs.  NUTRITION DIAGNOSIS:   Moderate Malnutrition related to chronic illness, cancer and cancer related treatments as evidenced by mild fat depletion, mild muscle depletion, percent weight loss, energy intake < 75% for > or equal to 1 month.  Ongoing  GOAL:   Patient will meet greater than or equal to 90% of their needs  Progressing   MONITOR:   Labs, Weight trends, I & O's, TF tolerance, Supplement acceptance  REASON FOR ASSESSMENT:   Consult Assessment of nutrition requirement/status, Enteral/tube feeding initiation and management  ASSESSMENT:   49 y.o. female with medical history significant of esophageal squamous cell carcinoma, chronic alcohol abuse, chronic dysphagia/odynophagia/malnutrition, vitamin B12 deficiency who was sent from cancer center to [facility] hospital with direct admission for placement of J-tube.  Patient been troubled with inability to tolerate solid food, weight loss, dysphagia/odynophagia due to her esophageal cancer.  5/20- s/p open j-tube placement, TF initiated  Reviewed I/O's: +2.4 L x 24 hours and +2.8 L since admission  Case discussed with [place] RD; Mg, K, and Phos has been ordered secondary to pt's refeeding risk. No lab results in yet.   Case discussed with pharmacy, who reports RN is seeking clarification  for titration in TF order. Osmolite 1.5 currently infusing @ 20 ml/hr, which provides 720 kcals, 30 grams protein, and 366 ml free water, meeting 38% of estimated kcal needs and 32% of estimated protein needs.  Pt currently NPO; RD will start with continuous TF due to refeeding risk and to ensure tolerance of feedings. Recommendations have been provided for home TF and discussed with RNCM. Pt does not have insurance and will receive TF via [facility].   Medications reviewed and include thiamine, MVI, and 0.9% sodium chloride infusion @ 75 ml/hr.   Labs reviewed: CBGS: [id number] (inpatient orders for glycemic control are 0-15 units insulin aspart every 4 hours).   Diet Order:   Diet Order            Diet NPO time specified Except for: Sips with Meds  Diet effective midnight              EDUCATION NEEDS:   Education needs have been addressed  Skin:  Skin Assessment: Skin Integrity Issues: Skin Integrity Issues:: Incisions Incisions: closed abdomen  Last BM:  02/19/20  Height:   Ht Readings from Last 1 Encounters:  02/22/20 5\' 5"  (1.651 m)    Weight:   Wt Readings from Last 1 Encounters:  02/22/20 63.6 kg   BMI:  Body mass index is 23.35 kg/m.  Estimated Nutritional Needs:   Kcal:  1900-2100  Protein:  95-110g  Fluid:  2L/day    Loistine Chance, RD, LDN, [place] Registered Dietitian II Certified Diabetes Care and Education Specialist Please refer to [facility] for RD and/or RD on-call/weekend/after  hours pager

## 2020-02-23 NOTE — Progress Notes (Signed)
Subjective No acute events. C/o some pain around tube. Denies nausea or emesis. Reports some bleeding around J tube early this AM.  Objective: Vital signs in last 24 hours: Temp:  [97.8 F (36.6 C)-98.7 F (37.1 C)] 98.5 F (36.9 C) (05/21 0402) Pulse Rate:  [69-93] 91 (05/21 0402) Resp:  [16-28] 18 (05/21 0402) BP: (97-157)/(64-101) 150/95 (05/21 0408) SpO2:  [93 %-99 %] 97 % (05/21 0402) Last BM Date: 02/19/20  Intake/Output from previous day: 05/20 0701 - 05/21 0700 In: 2416 [I.V.:1966; NG/GT:200; IV Piggyback:250] Out: 20 [Blood:20] Intake/Output this shift: No intake/output data recorded.  Gen: NAD, comfortable CV: RRR Pulm: Normal work of breathing Abd: Soft, appropriately tender without peritonitis, no active bleeding, J-tube feeds at 20 mL/hr Ext: SCDs in place  Lab Results: CBC  Recent Labs    02/21/20 0513 02/22/20 0535  WBC 7.0 8.0  HGB 12.0 12.5  HCT 35.2* 36.5  PLT 168 174   BMET Recent Labs    02/21/20 0513 02/22/20 0535  NA 137 135  K 3.1* 3.6  CL 99 103  CO2 27 23  GLUCOSE 102* 102*  BUN 12 8  CREATININE 0.60 0.42*  CALCIUM 8.6* 8.8*   PT/INR No results for input(s): LABPROT, INR in the last 72 hours. ABG No results for input(s): PHART, HCO3 in the last 72 hours.  Invalid input(s): PCO2, PO2  Studies/Results:  Anti-infectives: Anti-infectives (From admission, onward)   Start     Dose/Rate Route Frequency Ordered Stop   02/21/20 0600  ceFAZolin (ANCEF) IVPB 2g/100 mL premix     2 g 200 mL/hr over 30 Minutes Intravenous On call to O.R. 02/20/20 1229 02/22/20 0559   02/20/20 1145  cefTRIAXone (ROCEPHIN) 2 g in sodium chloride 0.9 % 100 mL IVPB  Status:  Discontinued     2 g 200 mL/hr over 30 Minutes Intravenous On call to O.R. 02/20/20 1141 02/20/20 1229   02/20/20 1145  metroNIDAZOLE (FLAGYL) IVPB 500 mg  Status:  Discontinued     500 mg 100 mL/hr over 60 Minutes Intravenous On call to O.R. 02/20/20 1141 02/20/20 1229        Assessment/Plan: Patient Active Problem List   Diagnosis Date Noted  . Failure to thrive (0-17) 02/20/2020  . Primary squamous cell carcinoma of lower third of esophagus ([place]) 02/14/2020  . Sepsis ([place]) 02/22/2018  . Pressure injury of skin 02/22/2018  . Airway intubation performed without difficulty   . Sepsis due to pneumonia ([place]) 01/27/2018  . Acute respiratory failure with hypoxia ([place]) 01/27/2018  . Hypokalemia 01/27/2018  . Prolonged QT interval 01/27/2018  . Acute respiratory failure with hypoxia and hypercapnia ([place]) 01/27/2018  . Hypomagnesemia 01/27/2018  . Community acquired pneumonia   . Hyponatremia 06/23/2017  . Pancreatitis 10/30/2016  . Acute pancreatitis 10/30/2016  . Tobacco abuse 10/30/2016  . Moderate malnutrition ([place]) 12/03/2015  . Hypotension 09/23/2015  . Acute kidney injury ([place]) 09/23/2015  . Septic shock ([place]) 09/21/2015  . History of depression 06/14/2015  . Asthma exacerbation 04/07/2011  . HEMATURIA UNSPECIFIED 07/25/2009  . Alcohol abuse 06/26/2009  . ALLERGIC RHINITIS 06/26/2009  . Fatigue 06/26/2009  . Essential hypertension 06/28/2007   GERD EtOH use asthma,    solid food dyspagia esophageal mass, squamous cell carcinoma - EGD 02/08/20 revealed mass in the mid esophagus that was biopsied and revealed squamous cell carcinoma. - staging CT C/A/P 02/14/20 which showed mass in the distal esophagus with mildly enlarged lymph nodes lateral to GE jxn and in  gastrohepatic ligament, suspicious for metastatic disease. PET Scan is pending. - Now POD#1 s/p open jejunostomy tube placement  - advance TF to goal as tolerated - scheduled follow up for patient for staple removal and follow up with Dr. Bobbye Morton - general surgery will sign off, call as needed.    LOS: 3 days   Obie Dredge, [facility] Use [email or web address] to contact on call provider

## 2020-02-23 NOTE — Discharge Instructions (Signed)
[facility] [phone]  OPEN ABDOMINAL SURGERY: POST OP INSTRUCTIONS  Always review your discharge instruction sheet given to you by the facility where your surgery was performed.  IF YOU HAVE DISABILITY OR FAMILY LEAVE FORMS, YOU MUST BRING THEM TO THE OFFICE FOR PROCESSING.  PLEASE DO NOT GIVE THEM TO YOUR DOCTOR.  1. A prescription for pain medication may be given to you upon discharge.  Take your pain medication as prescribed, if needed.  If narcotic pain medicine is not needed, then you may take acetaminophen (Tylenol) or ibuprofen (Advil) as needed. 2. Take your usually prescribed medications unless otherwise directed. 3. If you need a refill on your pain medication, please contact your pharmacy. They will contact our office to request authorization.  Prescriptions will not be filled after 5pm or on week-ends. 4. You should follow a light diet the first few days after arrival home, such as soup and crackers, pudding, etc.unless your doctor has advised otherwise. A high-fiber, low fat diet can be resumed as tolerated.   Be sure to include lots of fluids daily. Most patients will experience some swelling and bruising on the chest and neck area.  Ice packs will help.  Swelling and bruising can take several days to resolve 5. Most patients will experience some swelling and bruising in the area of the incision. Ice pack will help. Swelling and bruising can take several days to resolve..  6. It is common to experience some constipation if taking pain medication after surgery.  Increasing fluid intake and taking a stool softener will usually help or prevent this problem from occurring.  A mild laxative (Milk of Magnesia or Miralax) should be taken according to package directions if there are no bowel movements after 48 hours. 7.  You may have steri-strips (small skin tapes) in place directly over the incision.  These strips should be left on the skin for 7-10 days.  If your  surgeon used skin glue on the incision, you may shower in 24 hours.  The glue will flake off over the next 2-3 weeks.  Any sutures or staples will be removed at the office during your follow-up visit. You may find that a light gauze bandage over your incision may keep your staples from being rubbed or pulled. You may shower and replace the bandage daily. 8. ACTIVITIES:  You may resume regular (light) daily activities beginning the next day--such as daily self-care, walking, climbing stairs--gradually increasing activities as tolerated.  You may have sexual intercourse when it is comfortable.  Refrain from any heavy lifting or straining until approved by your doctor. a. You may drive when you no longer are taking prescription pain medication, you can comfortably wear a seatbelt, and you can safely maneuver your car and apply brakes b. Return to Work: ___________________________________ 9. You should see your doctor in the office for a follow-up appointment approximately two weeks after your surgery.  Make sure that you call for this appointment within a day or two after you arrive home to insure a convenient appointment time. OTHER INSTRUCTIONS:  _____________________________________________________________ _____________________________________________________________  WHEN TO CALL YOUR DOCTOR: 1. Fever over 101.0 2. Inability to urinate 3. Nausea and/or vomiting 4. Extreme swelling or bruising 5. Continued bleeding from incision. 6. Increased pain, redness, or drainage from the incision. 7. Difficulty swallowing or breathing 8. Muscle cramping or spasms. 9. Numbness or tingling in hands or feet or around lips.  The clinic staff is available to   answer your questions during regular business hours.  Please don't hesitate to call and ask to speak to one of the nurses if you have concerns.  For further questions, please visit [email or web address]   

## 2020-02-24 LAB — COMPREHENSIVE METABOLIC PANEL
ALT: 31 U/L (ref 0–44)
AST: 22 U/L (ref 15–41)
Albumin: 3 g/dL — ABNORMAL LOW (ref 3.5–5.0)
Alkaline Phosphatase: 54 U/L (ref 38–126)
Anion gap: 12 (ref 5–15)
BUN: 6 mg/dL (ref 6–20)
CO2: 25 mmol/L (ref 22–32)
Calcium: 9.1 mg/dL (ref 8.9–10.3)
Chloride: 103 mmol/L (ref 98–111)
Creatinine, Ser: 0.61 mg/dL (ref 0.44–1.00)
GFR calc Af Amer: >60 mL/min (ref >60)
GFR calc non Af Amer: >60 mL/min (ref >60)
Glucose, Bld: 149 mg/dL — ABNORMAL HIGH (ref 70–99)
Potassium: 3.5 mmol/L (ref 3.5–5.1)
Sodium: 140 mmol/L (ref 135–145)
Total Bilirubin: 0.6 mg/dL (ref 0.3–1.2)
Total Protein: 6.5 g/dL (ref 6.5–8.1)

## 2020-02-24 LAB — CBC
HCT: 39.7 % (ref 36.0–46.0)
Hemoglobin: 13.6 g/dL (ref 12.0–15.0)
MCH: 36 pg — ABNORMAL HIGH (ref 26.0–34.0)
MCHC: 34.3 g/dL (ref 30.0–36.0)
MCV: 105 fL — ABNORMAL HIGH (ref 80.0–100.0)
Platelets: 240 10*3/uL (ref 150–400)
RBC: 3.78 MIL/uL — ABNORMAL LOW (ref 3.87–5.11)
RDW: 12.4 % (ref 11.5–15.5)
WBC: 14 10*3/uL — ABNORMAL HIGH (ref 4.0–10.5)
nRBC: 0 % (ref 0.0–0.2)

## 2020-02-24 LAB — GLUCOSE, CAPILLARY
Glucose-Capillary: 133 mg/dL — ABNORMAL HIGH (ref 70–99)
Glucose-Capillary: 135 mg/dL — ABNORMAL HIGH (ref 70–99)
Glucose-Capillary: 140 mg/dL — ABNORMAL HIGH (ref 70–99)
Glucose-Capillary: 145 mg/dL — ABNORMAL HIGH (ref 70–99)
Glucose-Capillary: 154 mg/dL — ABNORMAL HIGH (ref 70–99)
Glucose-Capillary: 160 mg/dL — ABNORMAL HIGH (ref 70–99)
Glucose-Capillary: 186 mg/dL — ABNORMAL HIGH (ref 70–99)

## 2020-02-24 LAB — PHOSPHORUS: Phosphorus: 4.1 mg/dL (ref 2.5–4.6)

## 2020-02-24 LAB — MAGNESIUM: Magnesium: 1.6 mg/dL — ABNORMAL LOW (ref 1.7–2.4)

## 2020-02-24 MED ORDER — BISMUTH SUBSALICYLATE 262 MG/15ML PO SUSP
30.0000 mL | Freq: Once | ORAL | Status: DC
  Filled 2020-02-24: qty 236

## 2020-02-24 MED ORDER — BISMUTH SUBSALICYLATE 262 MG/15ML PO SUSP
30.0000 mL | Freq: Once | ORAL | Status: AC
  Administered 2020-02-24: 30 mL
  Filled 2020-02-24: qty 236

## 2020-02-24 MED ORDER — AMLODIPINE BESYLATE 10 MG PO TABS
10.0000 mg | ORAL_TABLET | Freq: Every day | ORAL | Status: DC
  Administered 2020-02-24 – 2020-02-27 (×4): 10 mg via JEJUNOSTOMY
  Filled 2020-02-24 (×4): qty 1

## 2020-02-24 MED ORDER — MAGNESIUM SULFATE 2 GM/50ML IV SOLN
2.0000 g | Freq: Once | INTRAVENOUS | Status: AC
  Administered 2020-02-24: 2 g via INTRAVENOUS
  Filled 2020-02-24: qty 50

## 2020-02-24 MED ORDER — PROCHLORPERAZINE EDISYLATE 10 MG/2ML IJ SOLN
10.0000 mg | Freq: Four times a day (QID) | INTRAMUSCULAR | Status: DC | PRN
  Administered 2020-02-24 – 2020-02-25 (×6): 10 mg via INTRAVENOUS
  Filled 2020-02-24 (×6): qty 2

## 2020-02-24 MED ORDER — ENOXAPARIN SODIUM 40 MG/0.4ML ~~LOC~~ SOLN
40.0000 mg | SUBCUTANEOUS | Status: DC
  Administered 2020-02-24 – 2020-02-26 (×3): 40 mg via SUBCUTANEOUS
  Filled 2020-02-24 (×3): qty 0.4

## 2020-02-24 MED ORDER — HYDRALAZINE HCL 20 MG/ML IJ SOLN: 10.0000 mg | INTRAMUSCULAR | Status: DC | PRN

## 2020-02-24 NOTE — Progress Notes (Signed)
[facility] notified patient having nausea and not time for Zofran. Arthor Captain LPN

## 2020-02-24 NOTE — Progress Notes (Signed)
[facility] notified that patient is having nausea and vomiting 4 mg Zofran IV given and Pepto was given inquired if need to hold tube feeding for 1hr. Arthor Captain LPN

## 2020-02-24 NOTE — Progress Notes (Signed)
[facility] informed that patient stated that she felt a little better but doesn't want tube feed restarted until evaluated by Md. Arthor Captain LPN

## 2020-02-24 NOTE — Progress Notes (Signed)
Patient having vomiting [facility] paged notified informed tube feed is held and IV zofran ineffective given [id number]. Arthor Captain LPN

## 2020-02-24 NOTE — Progress Notes (Signed)
Patient decided to have the feeding restarted via jejunostomy tube at a low rate of 20 mL/hr. No nausea or vomiting noted at this time. Will continue to monitor patient and if feeding can be increase as permitted.

## 2020-02-24 NOTE — Progress Notes (Signed)
PROGRESS NOTE  VANITA GENSEMER  [id number] DOB: 21-Oct-1970 DOA: 02/20/2020 PCP: Eulas Post, MD   Brief Narrative: Dan Humphreys a 49 y.o.femalewith a history of esophageal squamous cell carcinoma, chronic alcohol abuse, chronic dysphagia/odynophagia/malnutrition, vitamin B12 deficiency who was sent from cancer center to [facility] with direct admission for placement ofJ-tube due to worsening po intake. General surgery requested transfer to [facility] and placed J-tube 5/20. Feeds have been started and are being slowly increased. Slow progression due to nausea.  Assessment & Plan: Active Problems:   Failure to thrive (0-17)  Moderate protein calorie malnutrition and failure to thrive:  - s/p jejunostomy tube placement 5/20 and starting continuous tube feeds per RD recommendations. Stalled at 58ml/hr this AM with nausea which we will treat with antiemetics.  - Free water flushes, stop IVF once rate is adequate. Still unable to stop IVF. - Continue vitamins/minerals.  - Will monitor K, Mg, Phos due to risk of refeeding syndrome - CM arranging tube feed education and providing this at home at discharge. Scheduled follow up for patient for staple removal and follow up with Dr. Bobbye Morton  Squamous cell esophageal cancer:Affecting mid esophagus. EGD had shown partially obstructing tumor, biopsy had confirmed invasive squamous cell carcinoma, locally advanced disease with enlarged lymph nodes lateralto the GEJ - Staging PET scan is planned - Plan to start on concurrent chemoradiation with Taxol and carboplatin in 2 weeks.  Elevated BP: Suspected to be worsened by pain but has been consistently elevated. - Give norvasc per tube, prn hydralazine.  Chronic alcohol abuse: Continues to drink.Drinks 2 small glasses of hard liquor. - Stopped CIWA as withdrawal was not noted.  History of vitamin B12 deficiency: Currently not deficient. - Currently on vitamin B12  supplement  Hypokalemia: Supplemented  Hypomagnesemia:  - Supplement and monitor.   RN Pressure Injury Documentation: Pressure Injury 02/22/18 Unstageable - Full thickness tissue loss in which the base of the ulcer is covered by slough (yellow, tan, gray, green or brown) and/or eschar (tan, brown or black) in the wound bed. thin strip of open area between buttocks (Active)  02/22/18 0600  Location: Sacrum  Location Orientation: Medial  Staging: Unstageable - Full thickness tissue loss in which the base of the ulcer is covered by slough (yellow, tan, gray, green or brown) and/or eschar (tan, brown or black) in the wound bed.  Wound Description (Comments): thin strip of open area between buttocks  Present on Admission: Yes   DVT prophylaxis: Lovenox Code Status: Full Family Communication: None at bedside this AM. Spoke with husband 5/21. Disposition Plan:  Status is: Inpatient  Remains inpatient appropriate because:IV treatments appropriate due to intensity of illness or inability to take PO and Requires titration of tube feeds, monitoring for refeeding syndrome. Continues to have difficulty titrating up tube feeds. Requiring IV pain and nausea medications.   Dispo: The patient is from: Home              Anticipated d/c is to: Home              Anticipated d/c date is: 2 days              Patient currently is not medically stable to d/c.  Consultants:   Oncology  General surgery  Procedures:   Jejunostomy tube placement 02/22/2020  Antimicrobials:  None   Subjective: Nausea has been constant, severe, minimally improved with zofran, associated with initiation of tube feeds. Willing to try other antiemetics. Pain  is controlled. Got OOB yesterday.  Objective: Vitals:   02/23/20 2232 02/24/20 0459 02/24/20 0501 02/24/20 1337  BP: (!) 152/94 (!) 173/104 (!) 165/100 (!) 181/118  Pulse: 76 84  96  Resp: 17 15  16   Temp: 97.8 F (36.6 C) 98.2 F (36.8 C)  (!) 97.5 F  (36.4 C)  TempSrc: Oral Oral  Oral  SpO2: 98% 98%  97%  Weight:      Height:        Intake/Output Summary (Last 24 hours) at 02/24/2020 1400 Last data filed at 02/24/2020 1350 Gross per 24 hour  Intake 441.67 ml  Output 500 ml  Net -58.33 ml   Filed Weights   02/20/20 1150 02/22/20 0849 02/23/20 1800  Weight: 63.6 kg 63.6 kg 68.4 kg   Gen: Frail female in no distress Pulm: Nonlabored breathing room air. Clear. CV: Regular rate and rhythm. No murmur, rub, or gallop. No JVD, no dependent edema. GI: Abdomen soft, unchanged diffuse tenderness, no discharge or bleeding from wounds. Non-distended, with normoactive bowel sounds.  Ext: Warm, no deformities Skin: No new rashes, lesions or ulcers on visualized skin. Surgical incision c/d/i, PEG site without erythema. Neuro: Alert and oriented. No focal neurological deficits. Psych: Judgement and insight appear fair. Mood euthymic & affect congruent. Behavior is appropriate.    Data Reviewed: I have personally reviewed following labs and imaging studies  CBC: Recent Labs  Lab 02/20/20 0911 02/21/20 0513 02/22/20 0535 02/24/20 0745  WBC 9.3 7.0 8.0 14.0*  NEUTROABS 4.5  --  3.9  --   HGB 13.4 12.0 12.5 13.6  HCT 38.3 35.2* 36.5 39.7  MCV 101.9* 105.4* 106.7* 105.0*  PLT 191 168 174 [id number]   Basic Metabolic Panel: Recent Labs  Lab 02/20/20 0911 02/21/20 0513 02/22/20 0535 02/23/20 1017 02/24/20 0745  NA 140 137 135 138 140  K 3.2* 3.1* 3.6 3.6 3.5  CL 92* 99 103 105 103  CO2 31 27 23 24 25   GLUCOSE 139* 102* 102* 149* 149*  BUN 13 12 8 7 6   CREATININE 0.77 0.60 0.42* 0.70 0.61  CALCIUM 10.2 8.6* 8.8* 9.1 9.1  MG  --   --   --  1.5* 1.6*  PHOS  --   --   --  2.7 4.1   Liver Function Tests: Recent Labs  Lab 02/20/20 0911 02/23/20 1017 02/24/20 0745  AST 44* 22 22  ALT 65* 34 31  ALKPHOS 64 49 54  BILITOT 0.8 0.6 0.6  PROT 6.9 6.0* 6.5  ALBUMIN 3.6 2.8* 3.0*   HbA1C: Recent Labs    02/23/20 0208  HGBA1C 5.2    CBG: Recent Labs  Lab 02/23/20 2011 02/24/20 0010 02/24/20 0452 02/24/20 0754 02/24/20 1157  GLUCAP 163* 133* 140* 160* 154*   Lipid Profile: No results for input(s): CHOL, HDL, LDLCALC, TRIG, CHOLHDL, LDLDIRECT in the last 72 hours. Thyroid Function Tests: No results for input(s): TSH, T4TOTAL, FREET4, T3FREE, THYROIDAB in the last 72 hours. Anemia Panel: No results for input(s): VITAMINB12, FOLATE, FERRITIN, TIBC, IRON, RETICCTPCT in the last 72 hours. Urine analysis:    Component Value Date/Time   COLORURINE STRAW (A) 02/22/2018 0300   APPEARANCEUR CLEAR 02/22/2018 0300   LABSPEC 1.003 (L) 02/22/2018 0300   PHURINE 5.0 02/22/2018 0300   GLUCOSEU NEGATIVE 02/22/2018 0300   HGBUR NEGATIVE 02/22/2018 0300   HGBUR negative 01/01/2010 1340   BILIRUBINUR NEGATIVE 02/22/2018 0300   BILIRUBINUR 3+ 03/18/2016 1614   KETONESUR NEGATIVE 02/22/2018 0300  PROTEINUR NEGATIVE 02/22/2018 0300   UROBILINOGEN 2.0 03/18/2016 1614   UROBILINOGEN 0.2 05/06/2013 1440   NITRITE NEGATIVE 02/22/2018 0300   LEUKOCYTESUR NEGATIVE 02/22/2018 0300   Recent Results (from the past 240 hour(s))  SARS Coronavirus 2 by RT PCR (hospital order, performed in [facility] hospital lab) Nasopharyngeal Nasopharyngeal Swab     Status: None   Collection Time: 02/20/20  5:28 PM   Specimen: Nasopharyngeal Swab  Result Value Ref Range Status   SARS Coronavirus 2 NEGATIVE NEGATIVE Final    Comment: (NOTE) SARS-CoV-2 target nucleic acids are NOT DETECTED. The SARS-CoV-2 RNA is generally detectable in upper and lower respiratory specimens during the acute phase of infection. The lowest concentration of SARS-CoV-2 viral copies this assay can detect is 250 copies / mL. A negative result does not preclude SARS-CoV-2 infection and should not be used as the sole basis for treatment or other patient management decisions.  A negative result may occur with improper specimen collection / handling, submission of  specimen other than nasopharyngeal swab, presence of viral mutation(s) within the areas targeted by this assay, and inadequate number of viral copies (<250 copies / mL). A negative result must be combined with clinical observations, patient history, and epidemiological information. Fact Sheet for Patients:   StrictlyIdeas.no Fact Sheet for Healthcare Providers: BankingDealers.co.za This test is not yet approved or cleared  by the [place] FDA and has been authorized for detection and/or diagnosis of SARS-CoV-2 by FDA under an Emergency Use Authorization (EUA).  This EUA will remain in effect (meaning this test can be used) for the duration of the COVID-19 declaration under Section 564(b)(1) of the Act, 21 U.S.C. section 360bbb-3(b)(1), unless the authorization is terminated or revoked sooner. Performed at [facility], [place] [address], [place], [place] [address]       Radiology Studies: No results found.  Scheduled Meds: . acetaminophen (TYLENOL) oral liquid 160 mg/5 mL  650 mg Per Tube [id number]  . folic acid  1 mg Per Tube Daily  . free water  100 mL Per Tube Q4H  . insulin aspart  0-15 Units Subcutaneous Q4H  . thiamine  100 mg Per J Tube Daily   Continuous Infusions: . sodium chloride 75 mL/hr at 02/24/20 0623  . feeding supplement (OSMOLITE 1.5 CAL) 30 mL/hr at 02/24/20 1343     LOS: 4 days   Time spent: 25 minutes.  Patrecia Pour, MD [facility] [email or web address] 02/24/2020, 2:00 PM

## 2020-02-24 NOTE — Progress Notes (Signed)
NT reported O2 stats 76% and HR 120 lung sounds clear and placed continuous pulse O2 stats 97-100% RM air HR 97. Continuous pulse ox continued for monitoring. Arthor Captain LPN

## 2020-02-24 NOTE — Plan of Care (Signed)
  Problem: Clinical Measurements: Goal: Ability to maintain clinical measurements within normal limits will improve Outcome: Progressing Goal: Will remain free from infection Outcome: Progressing Goal: Diagnostic test results will improve Outcome: Progressing Goal: Respiratory complications will improve Outcome: Progressing Goal: Cardiovascular complication will be avoided Outcome: Progressing   Problem: Activity: Goal: Risk for activity intolerance will decrease Outcome: Progressing   Problem: Nutrition: Goal: Adequate nutrition will be maintained Outcome: Not Progressing   Problem: Elimination: Goal: Will not experience complications related to bowel motility Outcome: Progressing Goal: Will not experience complications related to urinary retention Outcome: Progressing   Problem: Pain Managment: Goal: General experience of comfort will improve Outcome: Progressing   Problem: Safety: Goal: Ability to remain free from injury will improve Outcome: Progressing   Problem: Skin Integrity: Goal: Risk for impaired skin integrity will decrease Outcome: Progressing

## 2020-02-25 ENCOUNTER — Encounter (HOSPITAL_COMMUNITY): Payer: Self-pay | Admitting: Internal Medicine

## 2020-02-25 DIAGNOSIS — E440 Moderate protein-calorie malnutrition: Secondary | ICD-10-CM

## 2020-02-25 DIAGNOSIS — C155 Malignant neoplasm of lower third of esophagus: Principal | ICD-10-CM

## 2020-02-25 LAB — CBC
HCT: 38.7 % (ref 36.0–46.0)
Hemoglobin: 13.1 g/dL (ref 12.0–15.0)
MCH: 35.9 pg — ABNORMAL HIGH (ref 26.0–34.0)
MCHC: 33.9 g/dL (ref 30.0–36.0)
MCV: 106 fL — ABNORMAL HIGH (ref 80.0–100.0)
Platelets: 279 10*3/uL (ref 150–400)
RBC: 3.65 MIL/uL — ABNORMAL LOW (ref 3.87–5.11)
RDW: 12.4 % (ref 11.5–15.5)
WBC: 12.4 10*3/uL — ABNORMAL HIGH (ref 4.0–10.5)
nRBC: 0 % (ref 0.0–0.2)

## 2020-02-25 LAB — COMPREHENSIVE METABOLIC PANEL
ALT: 22 U/L (ref 0–44)
AST: 20 U/L (ref 15–41)
Albumin: 2.7 g/dL — ABNORMAL LOW (ref 3.5–5.0)
Alkaline Phosphatase: 55 U/L (ref 38–126)
Anion gap: 14 (ref 5–15)
BUN: 15 mg/dL (ref 6–20)
CO2: 26 mmol/L (ref 22–32)
Calcium: 8.8 mg/dL — ABNORMAL LOW (ref 8.9–10.3)
Chloride: 102 mmol/L (ref 98–111)
Creatinine, Ser: 0.6 mg/dL (ref 0.44–1.00)
GFR calc Af Amer: >60 mL/min (ref >60)
GFR calc non Af Amer: >60 mL/min (ref >60)
Glucose, Bld: 138 mg/dL — ABNORMAL HIGH (ref 70–99)
Potassium: 3.1 mmol/L — ABNORMAL LOW (ref 3.5–5.1)
Sodium: 142 mmol/L (ref 135–145)
Total Bilirubin: 0.2 mg/dL — ABNORMAL LOW (ref 0.3–1.2)
Total Protein: 5.9 g/dL — ABNORMAL LOW (ref 6.5–8.1)

## 2020-02-25 LAB — GLUCOSE, CAPILLARY
Glucose-Capillary: 165 mg/dL — ABNORMAL HIGH (ref 70–99)
Glucose-Capillary: 120 mg/dL — ABNORMAL HIGH (ref 70–99)
Glucose-Capillary: 141 mg/dL — ABNORMAL HIGH (ref 70–99)
Glucose-Capillary: 192 mg/dL — ABNORMAL HIGH (ref 70–99)
Glucose-Capillary: 127 mg/dL — ABNORMAL HIGH (ref 70–99)

## 2020-02-25 LAB — MAGNESIUM: Magnesium: 2.3 mg/dL (ref 1.7–2.4)

## 2020-02-25 LAB — PHOSPHORUS: Phosphorus: 3.9 mg/dL (ref 2.5–4.6)

## 2020-02-25 MED ORDER — POTASSIUM CHLORIDE 20 MEQ PO PACK
40.0000 meq | PACK | Freq: Four times a day (QID) | ORAL | Status: AC
  Administered 2020-02-25 (×2): 40 meq via ORAL
  Filled 2020-02-25 (×2): qty 2

## 2020-02-25 NOTE — Progress Notes (Signed)
Pt refused to increase the feeding rate. Pt noted to be nauseas and vomited x2 overnight. Md notified

## 2020-02-25 NOTE — Plan of Care (Signed)
  Problem: Education: Goal: Knowledge of General Education information will improve Description Including pain rating scale, medication(s)/side effects and non-pharmacologic comfort measures Outcome: Progressing   

## 2020-02-25 NOTE — Progress Notes (Addendum)
Progress Note    Natalie Morrison  [id number] DOB: 07-18-1971  DOA: 02/20/2020 PCP: Eulas Post, MD      Brief Narrative:    Medical records reviewed and are as summarized below:  Natalie Morrison is an 49 y.o. female with a history of esophageal squamous cell carcinoma, chronic alcohol abuse, chronic dysphagia/odynophagia/malnutrition, vitamin B12 deficiency who was sent from cancer center to [facility] long hospital with direct admission for placement ofJ-tube due to worsening po intake. General surgery requested transfer to [facility] and placed J-tube 5/20. Feeds have been started and are being slowly increased.  Unfortunately, upward titration of enteral nutrition was limited by nausea and vomiting.      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  Continue enteral nutrition per RD recommendations.  Increase rate as tolerated.  IV fluids will be discontinued once rate of enteral nutrition is adequate.  Continue thiamine and folate.  Scheduled follow up for patient for staple removal and follow up with Dr. Bobbye Morton  Hypokalemia Replete potassium and monitor levels.   Squamous cell esophageal cancer:Affecting mid esophagus. EGD had shown partially obstructing tumor, biopsy had confirmed invasive squamous cell carcinoma, locally advanced disease with enlarged lymph nodes lateralto the GEJ - Staging PET scan is planned -Oncology plans to start on concurrent chemoradiation with Taxol and carboplatin in 2 weeks.  Hypertension: Continue Norvasc.  Alcohol use disorder: No evidence of alcohol withdrawal syndrome.  CIWA protocol has been discontinued.  Hypokalemia:  Replete potassium and monitor levels.  Hypomagnesemia:  Improved      Body mass index is 25.17 kg/m.   Family Communication/Anticipated D/C date and plan/Code Status   DVT prophylaxis: Lovenox Code Status: Full code Family Communication: Plan discussed with patient Disposition Plan:    Status is: Inpatient  Remains inpatient appropriate because:Inpatient level of care appropriate due to severity of illness and Nausea and vomiting   Dispo: The patient is from: Home              Anticipated d/c is to: Home              Anticipated d/c date is: 1 day              Patient currently is not medically stable to d/c.            Subjective:   C/o nausea, vomiting , abdominal pain. She vomited twice in the early hours of the morning  Objective:    Vitals:   02/24/20 1950 02/25/20 0407 02/25/20 0500 02/25/20 1329  BP: (!) 136/97 (!) 145/96  (!) 136/95  Pulse: 96 88  100  Resp:  18  18  Temp: 98.4 F (36.9 C)   97.6 F (36.4 C)  TempSrc: Oral   Oral  SpO2: 98% 97%  98%  Weight:   68.6 kg   Height:       No data found.   Intake/Output Summary (Last 24 hours) at 02/25/2020 1658 Last data filed at 02/25/2020 1500 Gross per 24 hour  Intake 2470.13 ml  Output 150 ml  Net 2320.13 ml   Filed Weights   02/22/20 0849 02/23/20 1800 02/25/20 0500  Weight: 63.6 kg 68.4 kg 68.6 kg    Exam:  GEN:  NAD SKIN: Warm and dry.  Unstageable sacral decubitus ulcer EYES: EOMI ENT: MMM CV: RRR PULM: CTA B ABD: soft, ND, NT, +BS, +JT tube, surgical incision  CNS: AAO x 3, non focal EXT: No edema or tenderness   Data Reviewed:   I have personally reviewed following labs and imaging studies:  Labs: Labs show the following:   Basic Metabolic Panel: Recent Labs  Lab 02/21/20 0513 02/21/20 0513 02/22/20 0535 02/22/20 0535 02/23/20 1017 02/23/20 1017 02/24/20 0745 02/25/20 0712  NA 137  --  135  --  138  --  140 142  K 3.1*   < > 3.6   < > 3.6   < > 3.5 3.1*  CL 99  --  103  --  105  --  103 102  CO2 27  --  23  --  24   --  25 26  GLUCOSE 102*  --  102*  --  149*  --  149* 138*  BUN 12  --  8  --  7  --  6 15  CREATININE 0.60  --  0.42*  --  0.70  --  0.61 0.60  CALCIUM 8.6*  --  8.8*  --  9.1  --  9.1 8.8*  MG  --   --   --   --  1.5*  --  1.6* 2.3  PHOS  --   --   --   --  2.7  --  4.1 3.9   < > = values in this interval not displayed.   GFR Estimated Creatinine Clearance: 81.8 mL/min (by C-G formula based on SCr of 0.6 mg/dL). Liver Function Tests: Recent Labs  Lab 02/20/20 0911 02/23/20 1017 02/24/20 0745 02/25/20 0712  AST 44* 22 22 20   ALT 65* 34 31 22  ALKPHOS 64 49 54 55  BILITOT 0.8 0.6 0.6 0.2*  PROT 6.9 6.0* 6.5 5.9*  ALBUMIN 3.6 2.8* 3.0* 2.7*   No results for input(s): LIPASE, AMYLASE in the last 168 hours. No results for input(s): AMMONIA in the last 168 hours. Coagulation profile No results for input(s): INR, PROTIME in the last 168 hours.  CBC: Recent Labs  Lab 02/20/20 0911 02/21/20 0513 02/22/20 0535 02/24/20 0745 02/25/20 0712  WBC 9.3 7.0 8.0 14.0* 12.4*  NEUTROABS 4.5  --  3.9  --   --   HGB 13.4 12.0 12.5 13.6 13.1  HCT 38.3 35.2* 36.5 39.7 38.7  MCV 101.9* 105.4* 106.7* 105.0* 106.0*  PLT 191 168 174 240 279   Cardiac Enzymes: No results for input(s): CKTOTAL, CKMB, CKMBINDEX, TROPONINI in the last 168 hours. BNP (last 3 results) No results for input(s): PROBNP in the last 8760 hours. CBG: Recent Labs  Lab 02/24/20 2321 02/25/20 0408 02/25/20 0737 02/25/20 1123 02/25/20 1617  GLUCAP 145* 165* 120* 141* 192*   D-Dimer: No results for input(s): DDIMER in the last 72 hours. Hgb A1c: Recent Labs    02/23/20 0208  HGBA1C 5.2   Lipid Profile: No results for input(s): CHOL, HDL, LDLCALC, TRIG, CHOLHDL, LDLDIRECT in the last 72 hours. Thyroid function studies: No results for input(s): TSH, T4TOTAL, T3FREE, THYROIDAB in the last 72 hours.  Invalid input(s): FREET3 Anemia work up: No results for input(s): VITAMINB12, FOLATE, FERRITIN, TIBC, IRON,  RETICCTPCT in the last 72 hours. Sepsis Labs: Recent Labs  Lab 02/21/20 0513 02/22/20 0535 02/24/20 0745 02/25/20 0712  WBC 7.0 8.0 14.0* 12.4*    Microbiology Recent Results (from  the past 240 hour(s))  SARS Coronavirus 2 by RT PCR (hospital order, performed in [facility] hospital lab) Nasopharyngeal Nasopharyngeal Swab     Status: None   Collection Time: 02/20/20  5:28 PM   Specimen: Nasopharyngeal Swab  Result Value Ref Range Status   SARS Coronavirus 2 NEGATIVE NEGATIVE Final    Comment: (NOTE) SARS-CoV-2 target nucleic acids are NOT DETECTED. The SARS-CoV-2 RNA is generally detectable in upper and lower respiratory specimens during the acute phase of infection. The lowest concentration of SARS-CoV-2 viral copies this assay can detect is 250 copies / mL. A negative result does not preclude SARS-CoV-2 infection and should not be used as the sole basis for treatment or other patient management decisions.  A negative result may occur with improper specimen collection / handling, submission of specimen other than nasopharyngeal swab, presence of viral mutation(s) within the areas targeted by this assay, and inadequate number of viral copies (<250 copies / mL). A negative result must be combined with clinical observations, patient history, and epidemiological information. Fact Sheet for Patients:   StrictlyIdeas.no Fact Sheet for Healthcare Providers: BankingDealers.co.za This test is not yet approved or cleared  by the [place] FDA and has been authorized for detection and/or diagnosis of SARS-CoV-2 by FDA under an Emergency Use Authorization (EUA).  This EUA will remain in effect (meaning this test can be used) for the duration of the COVID-19 declaration under Section 564(b)(1) of the Act, 21 U.S.C. section 360bbb-3(b)(1), unless the authorization is terminated or revoked sooner. Performed at [facility], [place] [address] [place], [place] [address]     Procedures and diagnostic studies:  No results found.  Medications:   . acetaminophen (TYLENOL) oral liquid 160 mg/5 mL  650 mg Per Tube Q6H  . amLODipine  10 mg Per J Tube Daily  . enoxaparin (LOVENOX) injection  40 mg Subcutaneous Q24H  . folic acid  1 mg Per Tube Daily  . free water  100 mL Per Tube Q4H  . insulin aspart  0-15 Units Subcutaneous Q4H  . potassium chloride  40 mEq Oral Q6H  . thiamine  100 mg Per J Tube Daily   Continuous Infusions: . sodium chloride 75 mL/hr at 02/25/20 1524  . feeding supplement (OSMOLITE 1.5 CAL) 1,000 mL (02/25/20 1524)     LOS: 5 days   Karole Oo  [facility]     02/25/2020, 4:58 PM

## 2020-02-25 NOTE — Evaluation (Signed)
Occupational Therapy Evaluation Patient Details Name: Natalie Morrison MRN: [id number] DOB: Jun 20, 1971 Today's Date: 02/25/2020    History of Present Illness HUNTLEY BUTORAC is a 49 y.o. female with medical history significant of esophageal squamous cell carcinoma, chronic alcohol abuse, chronic dysphagia/odynophagia/malnutrition, vitamin B12 deficiency who was sent from cancer center to [facility] with direct admission for placement of J-tube.   Clinical Impression   This 49 yo female admitted and underwent above presents to acute OT with PLOF of being totally independent with all basic ADLs, IADLs, and driving. Currently patient is feeling a little weaker than her baseline and is having abdominal pain from tube feeds (per chart) and from J-tube insertion site (per patient). She will benefit from acute OT without need for follow up.    Follow Up Recommendations  No OT follow up;Supervision - Intermittent    Equipment Recommendations  None recommended by OT       Precautions / Restrictions Precautions Precautions: Other (comment) Precaution Comments: New J-tube Restrictions Weight Bearing Restrictions: No      Mobility Bed Mobility Overal bed mobility: Modified Independent(increased time)              Transfers Overall transfer level: Needs assistance Equipment used: None Transfers: Sit to/from Stand Sit to Stand: Supervision              Balance Overall balance assessment: No apparent balance deficits (not formally assessed)                                         ADL either performed or assessed with clinical judgement   ADL Overall ADL's : Needs assistance/impaired Eating/Feeding: NPO   Grooming: Oral care;Set up;Supervision/safety;Standing   Upper Body Bathing: Set up;Sitting   Lower Body Bathing: Supervison/ safety;Set up;Sit to/from stand   Upper Body Dressing : Set up;Sitting   Lower Body Dressing: Set  up;Supervision/safety;Sit to/from stand   Toilet Transfer: Supervision/safety;Ambulation Toilet Transfer Details (indicate cue type and reason): Bed>into bathroom to brush teeth>back to sit on EOB Toileting- Clothing Manipulation and Hygiene: Supervision/safety;Sit to/from stand               Vision Patient Visual Report: No change from baseline              Pertinent Vitals/Pain Pain Assessment: Faces Faces Pain Scale: Hurts little more Pain Location: stomach, J-tube site Pain Descriptors / Indicators: Sore Pain Intervention(s): Limited activity within patient's tolerance;Monitored during session     Hand Dominance Right   Extremity/Trunk Assessment Upper Extremity Assessment Upper Extremity Assessment: Overall WFL for tasks assessed     Communication Communication Communication: No difficulties   Cognition Arousal/Alertness: Awake/alert Behavior During Therapy: WFL for tasks assessed/performed Overall Cognitive Status: Within Functional Limits for tasks assessed                                                Home Living Family/patient expects to be discharged to:: Private residence Living Arrangements: Spouse/significant other Available Help at Discharge: Family;Available PRN/intermittently Type of Home: House Home Access: Stairs to enter [employer] of Steps: 4 Entrance Stairs-Rails: Right;Left;Can reach both Home Layout: One level     Bathroom Shower/Tub: Corporate investment banker: Standard  Home Equipment: Shower seat          Prior Functioning/Environment Level of Independence: Independent                 OT Problem List: Decreased range of motion;Pain      OT Treatment/Interventions: Self-care/ADL training;DME and/or AE instruction;Patient/family education;Balance training    OT Goals(Current goals can be found in the care plan section) Acute Rehab OT Goals Patient Stated Goal: less  pain, nausea, get out of bed to chair OT Goal Formulation: With patient Time For Goal Achievement: 02/25/20 Potential to Achieve Goals: Good  OT Frequency: Min 2X/week              AM-PAC OT "6 Clicks" Daily Activity     Outcome Measure Help from another person eating meals?: None Help from another person taking care of personal grooming?: A Little Help from another person toileting, which includes using toliet, bedpan, or urinal?: A Little Help from another person bathing (including washing, rinsing, drying)?: A Little Help from another person to put on and taking off regular upper body clothing?: A Little Help from another person to put on and taking off regular lower body clothing?: A Little 6 Click Score: 19   End of Session Nurse Communication: Mobility status  Activity Tolerance: Patient tolerated treatment well(she was having pain in abdomen) Patient left: in chair;with call bell/phone within reach;with chair alarm set  OT Visit Diagnosis: Unsteadiness on feet (R26.81);Pain Pain - part of body: (abdomen)                Time: [id number] OT Time Calculation (min): 8 min Charges:  OT General Charges $OT Visit: 1 Visit OT Evaluation $OT Eval Low Complexity: 1 Low [place], OTR/L Acute [employer] Pager [phone] Office [phone]     Almon Register 02/25/2020, 9:47 AM

## 2020-02-25 NOTE — Evaluation (Signed)
Physical Therapy Evaluation Patient Details Name: Natalie Morrison MRN: [id number] DOB: 14-Aug-1971 Today's Date: 02/25/2020   History of Present Illness  Natalie Morrison is a 49 y.o. female with medical history significant of esophageal squamous cell carcinoma, chronic alcohol abuse, chronic dysphagia/odynophagia/malnutrition, vitamin B12 deficiency who was sent from cancer center to [facility] with direct admission for placement of J-tube.  Clinical Impression  Pt admitted with above. Prior to admission, she lives with her spouse and is independent. On PT evaluation, pt presents with decreased functional mobility secondary to abdominal pain and decreased endurance. Pt reporting improved nausea from yesterday, but still present. Able to tolerate limited hallway ambulation (x150 feet) with no assistive device. Education provided regarding ambulation frequency and duration as tolerated. Will continue to follow to promote mobility. No follow up PT needs anticipated.     Follow Up Recommendations No PT follow up    Equipment Recommendations  None recommended by PT    Recommendations for Other Services       Precautions / Restrictions Precautions Precautions: Other (comment) Precaution Comments: New J-tube Restrictions Weight Bearing Restrictions: No      Mobility  Bed Mobility               General bed mobility comments: OOB with OT upon entry  Transfers Overall transfer level: Independent Equipment used: None                Ambulation/Gait Ambulation/Gait assistance: Supervision Gait Distance (Feet): 150 Feet Assistive device: None;IV Pole Gait Pattern/deviations: Step-through pattern;Decreased stride length Gait velocity: decreased   General Gait Details: Supervision for line management, no gross imbalance noted. Arms in guarded position (likely due to pain).  Stairs            Wheelchair Mobility    Modified Rankin (Stroke Patients Only)        Balance Overall balance assessment: No apparent balance deficits (not formally assessed)                                           Pertinent Vitals/Pain Pain Assessment: Faces Faces Pain Scale: Hurts little more Pain Location: stomach, J-tube site Pain Descriptors / Indicators: Sore Pain Intervention(s): Limited activity within patient's tolerance;Monitored during session    Home Living Family/patient expects to be discharged to:: Private residence Living Arrangements: Spouse/significant other Available Help at Discharge: Family;Available PRN/intermittently Type of Home: House Home Access: Stairs to enter Entrance Stairs-Rails: Right;Left;Can reach both Entrance Stairs-Number of Steps: 4 Home Layout: One level Home Equipment: Shower seat      Prior Function Level of Independence: Independent               Hand Dominance   Dominant Hand: Right    Extremity/Trunk Assessment   Upper Extremity Assessment Upper Extremity Assessment: Overall WFL for tasks assessed    Lower Extremity Assessment Lower Extremity Assessment: Overall WFL for tasks assessed    Cervical / Trunk Assessment Cervical / Trunk Assessment: Normal  Communication   Communication: No difficulties  Cognition Arousal/Alertness: Awake/alert Behavior During Therapy: WFL for tasks assessed/performed Overall Cognitive Status: Within Functional Limits for tasks assessed                                        General Comments  Exercises     Assessment/Plan    PT Assessment Patient needs continued PT services  PT Problem List Decreased activity tolerance;Decreased mobility;Pain       PT Treatment Interventions Gait training;Stair training;Functional mobility training;Therapeutic activities;Therapeutic exercise;Balance training;Patient/family education    PT Goals (Current goals can be found in the Care Plan section)  Acute Rehab PT Goals Patient  Stated Goal: less pain, nausea, get out of bed to chair PT Goal Formulation: With patient Time For Goal Achievement: 03/10/20 Potential to Achieve Goals: Good    Frequency Min 3X/week   Barriers to discharge        Co-evaluation               AM-PAC PT "6 Clicks" Mobility  Outcome Measure Help needed turning from your back to your side while in a flat bed without using bedrails?: None Help needed moving from lying on your back to sitting on the side of a flat bed without using bedrails?: None Help needed moving to and from a bed to a chair (including a wheelchair)?: None Help needed standing up from a chair using your arms (e.g., wheelchair or bedside chair)?: None Help needed to walk in hospital room?: None Help needed climbing 3-5 steps with a railing? : A Little 6 Click Score: 23    End of Session   Activity Tolerance: Patient tolerated treatment well Patient left: in chair;with call bell/phone within reach;with chair alarm set Nurse Communication: Mobility status PT Visit Diagnosis: Pain;Difficulty in walking, not elsewhere classified (R26.2) Pain - part of body: (abdomen)    Time: [id number] PT Time Calculation (min) (ACUTE ONLY): 8 min   Charges:   PT Evaluation $PT Eval Moderate Complexity: 1 Mod            Wyona Almas, PT, DPT Acute Rehabilitation Services Pager [phone] Office [phone]   Deno Etienne 02/25/2020, 9:29 AM

## 2020-02-26 LAB — BASIC METABOLIC PANEL
Anion gap: 8 (ref 5–15)
BUN: 15 mg/dL (ref 6–20)
CO2: 24 mmol/L (ref 22–32)
Calcium: 8.8 mg/dL — ABNORMAL LOW (ref 8.9–10.3)
Chloride: 111 mmol/L (ref 98–111)
Creatinine, Ser: 0.6 mg/dL (ref 0.44–1.00)
GFR calc Af Amer: >60 mL/min (ref >60)
GFR calc non Af Amer: >60 mL/min (ref >60)
Glucose, Bld: 131 mg/dL — ABNORMAL HIGH (ref 70–99)
Potassium: 4.4 mmol/L (ref 3.5–5.1)
Sodium: 143 mmol/L (ref 135–145)

## 2020-02-26 LAB — GLUCOSE, CAPILLARY
Glucose-Capillary: 105 mg/dL — ABNORMAL HIGH (ref 70–99)
Glucose-Capillary: 121 mg/dL — ABNORMAL HIGH (ref 70–99)
Glucose-Capillary: 125 mg/dL — ABNORMAL HIGH (ref 70–99)
Glucose-Capillary: 133 mg/dL — ABNORMAL HIGH (ref 70–99)
Glucose-Capillary: 166 mg/dL — ABNORMAL HIGH (ref 70–99)
Glucose-Capillary: 95 mg/dL (ref 70–99)

## 2020-02-26 LAB — CBC WITH DIFFERENTIAL/PLATELET
Abs Immature Granulocytes: 0.19 10*3/uL — ABNORMAL HIGH (ref 0.00–0.07)
Basophils Absolute: 0.1 10*3/uL (ref 0.0–0.1)
Basophils Relative: 1 %
Eosinophils Absolute: 0.1 10*3/uL (ref 0.0–0.5)
Eosinophils Relative: 1 %
HCT: 37.5 % (ref 36.0–46.0)
Hemoglobin: 12.5 g/dL (ref 12.0–15.0)
Immature Granulocytes: 2 %
Lymphocytes Relative: 20 %
Lymphs Abs: 2.4 10*3/uL (ref 0.7–4.0)
MCH: 35.8 pg — ABNORMAL HIGH (ref 26.0–34.0)
MCHC: 33.3 g/dL (ref 30.0–36.0)
MCV: 107.4 fL — ABNORMAL HIGH (ref 80.0–100.0)
Monocytes Absolute: 2.2 10*3/uL — ABNORMAL HIGH (ref 0.1–1.0)
Monocytes Relative: 18 %
Neutro Abs: 7 10*3/uL (ref 1.7–7.7)
Neutrophils Relative %: 58 %
Platelets: 295 10*3/uL (ref 150–400)
RBC: 3.49 MIL/uL — ABNORMAL LOW (ref 3.87–5.11)
RDW: 12.4 % (ref 11.5–15.5)
WBC: 11.9 10*3/uL — ABNORMAL HIGH (ref 4.0–10.5)
nRBC: 0 % (ref 0.0–0.2)

## 2020-02-26 LAB — PHOSPHORUS: Phosphorus: 2.5 mg/dL (ref 2.5–4.6)

## 2020-02-26 LAB — MAGNESIUM: Magnesium: 1.8 mg/dL (ref 1.7–2.4)

## 2020-02-26 MED ORDER — METOCLOPRAMIDE HCL 5 MG PO TABS
5.0000 mg | ORAL_TABLET | Freq: Three times a day (TID) | ORAL | Status: DC
  Administered 2020-02-26 – 2020-02-27 (×4): 5 mg
  Filled 2020-02-26 (×4): qty 1

## 2020-02-26 MED ORDER — FLUTICASONE PROPIONATE 50 MCG/ACT NA SUSP
1.0000 | Freq: Every day | NASAL | Status: DC
  Filled 2020-02-26 (×2): qty 16

## 2020-02-26 MED ORDER — LORATADINE 10 MG PO TABS
10.0000 mg | ORAL_TABLET | Freq: Every day | ORAL | Status: DC
  Administered 2020-02-26 – 2020-02-27 (×2): 10 mg via ORAL
  Filled 2020-02-26 (×2): qty 1

## 2020-02-26 NOTE — Progress Notes (Signed)
Occupational Therapy Treatment Patient Details Name: Natalie Morrison MRN: [id number] DOB: Oct 13, 1970 Today's Date: 02/26/2020    History of present illness Natalie Morrison is a 49 y.o. female with medical history significant of esophageal squamous cell carcinoma, chronic alcohol abuse, chronic dysphagia/odynophagia/malnutrition, vitamin B12 deficiency who was sent from cancer center to [facility] with direct admission for placement of J-tube.   OT comments  Pt semi-reclined in bed with husband at bedside upon arrival. Pt did not c/o pain or nausea. Pt agreeable to self-care activities and functional mobility with OT. Assessed bed mobility, functional transfers/mobility, grooming, dressing, and sponge bathing. Pt required setup for sponge bathing sitting EOB. Pt able to reach all body parts, excluding back. Pt dressed/undressed herself in sitting/standing positions with supervision for safety. Pt stood at sink to perform grooming activities including- brushing hair, brushing teeth, washing face/hands. Pt required supervision for balance. Pt slightly impulsive throughout tx session, reiterated importance of line management, pt receptive. Will continue to follow acutely as able.    Follow Up Recommendations  No OT follow up;Supervision - Intermittent    Equipment Recommendations  None recommended by OT    Recommendations for Other Services      Precautions / Restrictions Precautions Precautions: Other (comment) Precaution Comments: New J-tube Restrictions Weight Bearing Restrictions: No       Mobility Bed Mobility Overal bed mobility: Modified Independent             General bed mobility comments: transitioned to EOB and OOB with Mod I using bed railing for support  Transfers Overall transfer level: Needs assistance Equipment used: None Transfers: Sit to/from Stand Sit to Stand: Supervision         General transfer comment: No difficulty transferring off varying  surfaces; v/c's for line management    Balance                                           ADL either performed or assessed with clinical judgement   ADL Overall ADL's : Needs assistance/impaired     Grooming: Wash/dry hands;Wash/dry face;Oral care;Supervision/safety;Standing   Upper Body Bathing: Set up;Sitting   Lower Body Bathing: Supervison/ safety;Set up;Sitting/lateral leans;Sit to/from stand   Upper Body Dressing : Set up;Sitting   Lower Body Dressing: Supervision/safety;Set up;Sitting/lateral leans;Sit to/from stand               Functional mobility during ADLs: Supervision/safety General ADL Comments: Pt required v/c's for line management secondary to slight impulsiveness     Vision       Perception     Praxis      Cognition Arousal/Alertness: Awake/alert Behavior During Therapy: WFL for tasks assessed/performed Overall Cognitive Status: Within Functional Limits for tasks assessed                                 General Comments: pt did not c/o pain or nausea         Exercises     Shoulder Instructions       General Comments pt demonstrated good body awareness in regards to g-tube    Pertinent Vitals/ Pain       Pain Assessment: No/denies pain  Home Living  Prior Functioning/Environment              Frequency  Min 2X/week        Progress Toward Goals  OT Goals(current goals can now be found in the care plan section)  Progress towards OT goals: Progressing toward goals  Acute Rehab OT Goals Patient Stated Goal: return home with husband OT Goal Formulation: With patient  Plan Discharge plan remains appropriate    Co-evaluation                 AM-PAC OT "6 Clicks" Daily Activity     Outcome Measure   Help from another person eating meals?: None Help from another person taking care of personal grooming?: A Little Help from  another person toileting, which includes using toliet, bedpan, or urinal?: A Little Help from another person bathing (including washing, rinsing, drying)?: A Little Help from another person to put on and taking off regular upper body clothing?: None Help from another person to put on and taking off regular lower body clothing?: None 6 Click Score: 21    End of Session Equipment Utilized During Treatment: Gait belt  OT Visit Diagnosis: Unsteadiness on feet (R26.81);Pain Pain - part of body: (abdomen typically)   Activity Tolerance Patient tolerated treatment well   Patient Left in chair;with bed alarm set;with family/visitor present   Nurse Communication Mobility status        Time: 1500-1536 OT Time Calculation (min): 36 min  Charges: OT General Charges $OT Visit: 1 Visit OT Treatments $Self Care/Home Management : 23-37 mins  Michel Bickers, OTR/L [facility] Acute Rehab Services [phone]   Francesca Jewett 02/26/2020, 4:54 PM

## 2020-02-26 NOTE — Progress Notes (Signed)
Pt vomited twice, 1 unmeasured emesis and one 133ml. Pt requested tube feed be stopped for a while as she feels that it is making her sick and uncomfortable. Abdomen is distended. Tube feed stopped at 0520. 02/26/2020 @0536  Cyndi Bender, RN

## 2020-02-26 NOTE — Progress Notes (Signed)
Nutrition Follow-up  DOCUMENTATION CODES:   Non-severe (moderate) malnutrition in context of chronic illness  INTERVENTION:   -Continue Osmolite 1.5 @ 40 ml/hr via j-tube and increase by 10 ml every 8 hours to goal rate of 60 ml/hr.   Continue 100 ml free water flush every 4 hours  Tube feeding regimen provides 2160 kcal (100% of needs), 90 grams of protein, and 1097 ml of H2O. Total free water: 1697 ml daily  TF recommendations for home:   -Nocturnal feedings of Osmolite 1.5 @ 90 ml/hr via j-tube over 16 hour period  100 ml free water flush every 4 hours  Regimen will provides 2160 kcals, 90 grams protein, and 1697 ml free water daily, meeting 100% of estimated kcal and protein needs.  NUTRITION DIAGNOSIS:   Moderate Malnutrition related to chronic illness, cancer and cancer related treatments as evidenced by mild fat depletion, mild muscle depletion, percent weight loss, energy intake < 75% for > or equal to 1 month.  Ongoing  GOAL:   Patient will meet greater than or equal to 90% of their needs  Progressing   MONITOR:   Labs, Weight trends, I & O's, TF tolerance, Supplement acceptance  REASON FOR ASSESSMENT:   Consult Assessment of nutrition requirement/status, Enteral/tube feeding initiation and management  ASSESSMENT:   49 y.o. female with medical history significant of esophageal squamous cell carcinoma, chronic alcohol abuse, chronic dysphagia/odynophagia/malnutrition, vitamin B12 deficiency who was sent from cancer center to [facility] with direct admission for placement of J-tube.  Patient been troubled with inability to tolerate solid food, weight loss, dysphagia/odynophagia due to her esophageal cancer.  5/20- s/p open j-tube placement, TF initiated  Reviewed I/O's: +1.4 L x 24 hours and +8.2 L since admission  Case discussed with RN, who reports pt's TF were held earlier this morning due to vomiting. TF were just resumed by RN at 40 ml/hr  (pt has not vomited since the last time RN checked on her). TF at current rate providing 1440 ml kcals, 60 grams protein, and 1332 ml free water daily, meeting 76% of estimated kcal needs and 63% of estimated protein needs.   Medications reviewed and include folic acid, lovenox, reglan, morphine, and thiamine.   Labs reviewed: K, Mg, and Phos WDL. CBGS: 95-133 (inpatient orders for glycemic control are 0-15 units insulin aspart every 4 hours).   Diet Order:   Diet Order            Diet NPO time specified Except for: Sips with Meds  Diet effective midnight              EDUCATION NEEDS:   Education needs have been addressed  Skin:  Skin Assessment: Skin Integrity Issues: Skin Integrity Issues:: Incisions Incisions: closed abdomen  Last BM:  02/21/20  Height:   Ht Readings from Last 1 Encounters:  02/22/20 5\' 5"  (1.651 m)    Weight:   Wt Readings from Last 1 Encounters:  02/26/20 70.9 kg    Ideal Body Weight:     BMI:  Body mass index is 26.01 kg/m.  Estimated Nutritional Needs:   Kcal:  1900-2100  Protein:  95-110g  Fluid:  2L/day    Loistine Chance, RD, LDN, [place] Registered Dietitian II Certified Diabetes Care and Education Specialist Please refer to [facility] for RD and/or RD on-call/weekend/after hours pager

## 2020-02-26 NOTE — Plan of Care (Signed)
  Problem: Health Behavior/Discharge Planning: Goal: Ability to manage health-related needs will improve Outcome: Progressing   Problem: Education: Goal: Knowledge of General Education information will improve Description: Including pain rating scale, medication(s)/side effects and non-pharmacologic comfort measures Outcome: Progressing   Problem: Clinical Measurements: Goal: Ability to maintain clinical measurements within normal limits will improve Outcome: Progressing Goal: Will remain free from infection Outcome: Progressing Goal: Diagnostic test results will improve Outcome: Progressing Goal: Respiratory complications will improve Outcome: Progressing Goal: Cardiovascular complication will be avoided Outcome: Progressing   Problem: Activity: Goal: Risk for activity intolerance will decrease Outcome: Progressing   Problem: Nutrition: Goal: Adequate nutrition will be maintained Outcome: Progressing   Problem: Coping: Goal: Level of anxiety will decrease Outcome: Progressing   Problem: Coping: Goal: Level of anxiety will decrease Outcome: Progressing   Problem: Elimination: Goal: Will not experience complications related to bowel motility Outcome: Progressing Goal: Will not experience complications related to urinary retention Outcome: Progressing   Problem: Pain Managment: Goal: General experience of comfort will improve Outcome: Progressing   Problem: Safety: Goal: Ability to remain free from injury will improve Outcome: Progressing   Problem: Skin Integrity: Goal: Risk for impaired skin integrity will decrease Outcome: Progressing

## 2020-02-26 NOTE — Progress Notes (Addendum)
Progress Note    Natalie Morrison  [id number] DOB: 05/03/1971  DOA: 02/20/2020 PCP: Eulas Post, MD      Brief Narrative:    Medical records reviewed and are as summarized below:  Natalie Morrison is an 49 y.o. female with a history of esophageal squamous cell carcinoma, chronic alcohol abuse, chronic dysphagia/odynophagia/malnutrition, vitamin B12 deficiency who was sent from cancer center to [facility] long hospital with direct admission for placement ofJ-tube due to worsening po intake. General surgery requested transfer to [facility] and placed J-tube 5/20. Feeds have been started and are being slowly increased.  Unfortunately, upward titration of enteral nutrition was limited by nausea and vomiting.      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  Continue enteral nutrition per RD recommendations.  Increase rate as tolerated.  Reglan has been prescribed to see if this would help with nausea and vomiting.  IV fluids will be discontinued once rate of enteral nutrition is adequate.  Continue thiamine and folate.  Scheduled follow up for patient for staple removal and follow up with Dr. Bobbye Morton  Hypokalemia and hypomagnesemia Improved   Squamous cell esophageal cancer:Affecting mid esophagus. EGD had shown partially obstructing tumor, biopsy had confirmed invasive squamous cell carcinoma, locally advanced disease with enlarged lymph nodes lateralto the GEJ - Staging PET scan is planned -Oncology plans to start on concurrent chemoradiation with Taxol and carboplatin in 2 weeks.  Hypertension: Continue Norvasc.  Alcohol use disorder: No evidence of alcohol withdrawal syndrome.  CIWA protocol has been discontinued.  Patient is  agreeable with the plan.  Body mass index is 26.01 kg/m.   Family Communication/Anticipated D/C date and plan/Code Status   DVT prophylaxis: Lovenox Code Status: Full code Family Communication: Plan discussed with patient Disposition Plan:    Status is: Inpatient  Remains inpatient appropriate because:Inpatient level of care appropriate due to severity of illness and Nausea and vomiting   Dispo: The patient is from: Home              Anticipated d/c is to: Home              Anticipated d/c date is: 1 day              Patient currently is not medically stable to d/c.            Subjective:   Overnight events noted.  Patient vomited in the early hours of the morning and tube feeding was held because she said she could not tolerate it.  She feels a little better now.  She is frustrated about slow progression and recurrence of nausea and vomiting.  Objective:    Vitals:   02/26/20 0438 02/26/20 0500 02/26/20 1014 02/26/20 1400  BP: (!) 144/94  (!) 149/97 (!) 154/95  Pulse: 91  71 93  Resp: 14   18  Temp: 97.8 F (36.6 C)   98.3 F (36.8 C)  TempSrc: Oral   Oral  SpO2: 97%   97%  Weight:  70.9 kg    Height:       No data found.   Intake/Output Summary (Last 24 hours) at 02/26/2020 1551 Last data filed at 02/26/2020 1400 Gross per 24 hour  Intake 0  ml  Output --  Net 0 ml   Filed Weights   02/23/20 1800 02/25/20 0500 02/26/20 0500  Weight: 68.4 kg 68.6 kg 70.9 kg    Exam:  GEN: No acute distress SKIN: Warm and dry.  Unstageable sacral decubitus ulcer EYES: EOMI ENT: MMM CV: RRR PULM: CTA B ABD: soft, ND, NT, +BS, +JT tube, surgical incision  CNS: AAO x 3, non focal EXT: No edema or tenderness   Data Reviewed:   I have personally reviewed following labs and imaging studies:  Labs: Labs show the following:   Basic Metabolic Panel: Recent Labs  Lab 02/22/20 0535 02/22/20 0535 02/23/20 1017 02/23/20 1017 02/24/20 0745 02/24/20 0745  02/25/20 0712 02/26/20 0309  NA 135  --  138  --  140  --  142 143  K 3.6   < > 3.6   < > 3.5   < > 3.1* 4.4  CL 103  --  105  --  103  --  102 111  CO2 23  --  24  --  25  --  26 24  GLUCOSE 102*  --  149*  --  149*  --  138* 131*  BUN 8  --  7  --  6  --  15 15  CREATININE 0.42*  --  0.70  --  0.61  --  0.60 0.60  CALCIUM 8.8*  --  9.1  --  9.1  --  8.8* 8.8*  MG  --   --  1.5*  --  1.6*  --  2.3 1.8  PHOS  --   --  2.7  --  4.1  --  3.9 2.5   < > = values in this interval not displayed.   GFR Estimated Creatinine Clearance: 83.1 mL/min (by C-G formula based on SCr of 0.6 mg/dL). Liver Function Tests: Recent Labs  Lab 02/20/20 0911 02/23/20 1017 02/24/20 0745 02/25/20 0712  AST 44* 22 22 20   ALT 65* 34 31 22  ALKPHOS 64 49 54 55  BILITOT 0.8 0.6 0.6 0.2*  PROT 6.9 6.0* 6.5 5.9*  ALBUMIN 3.6 2.8* 3.0* 2.7*   No results for input(s): LIPASE, AMYLASE in the last 168 hours. No results for input(s): AMMONIA in the last 168 hours. Coagulation profile No results for input(s): INR, PROTIME in the last 168 hours.  CBC: Recent Labs  Lab 02/20/20 0911 02/20/20 0911 02/21/20 0513 02/22/20 0535 02/24/20 0745 02/25/20 0712 02/26/20 0309  WBC 9.3  --  7.0 8.0 14.0* 12.4* 11.9*  NEUTROABS 4.5  --   --  3.9  --   --  7.0  HGB 13.4  --  12.0 12.5 13.6 13.1 12.5  HCT 38.3   < > 35.2* 36.5 39.7 38.7 37.5  MCV 101.9*   < > 105.4* 106.7* 105.0* 106.0* 107.4*  PLT 191  --  168 174 240 279 295   < > = values in this interval not displayed.   Cardiac Enzymes: No results for input(s): CKTOTAL, CKMB, CKMBINDEX, TROPONINI in the last 168 hours. BNP (last 3 results) No results for input(s): PROBNP in the last 8760 hours. CBG: Recent Labs  Lab 02/25/20 2024 02/26/20 0026 02/26/20 0435 02/26/20 0749 02/26/20 1151  GLUCAP 127* 121* 125* 95 133*   D-Dimer: No results for input(s): DDIMER in the last 72 hours. Hgb A1c: No results for input(s): HGBA1C in the last 72  hours. Lipid Profile: No results for input(s): CHOL, HDL, LDLCALC, TRIG, CHOLHDL,  LDLDIRECT in the last 72 hours. Thyroid function studies: No results for input(s): TSH, T4TOTAL, T3FREE, THYROIDAB in the last 72 hours.  Invalid input(s): FREET3 Anemia work up: No results for input(s): VITAMINB12, FOLATE, FERRITIN, TIBC, IRON, RETICCTPCT in the last 72 hours. Sepsis Labs: Recent Labs  Lab 02/22/20 0535 02/24/20 0745 02/25/20 0712 02/26/20 0309  WBC 8.0 14.0* 12.4* 11.9*    Microbiology Recent Results (from the past 240 hour(s))  SARS Coronavirus 2 by RT PCR (hospital order, performed in [facility] hospital lab) Nasopharyngeal Nasopharyngeal Swab     Status: None   Collection Time: 02/20/20  5:28 PM   Specimen: Nasopharyngeal Swab  Result Value Ref Range Status   SARS Coronavirus 2 NEGATIVE NEGATIVE Final    Comment: (NOTE) SARS-CoV-2 target nucleic acids are NOT DETECTED. The SARS-CoV-2 RNA is generally detectable in upper and lower respiratory specimens during the acute phase of infection. The lowest concentration of SARS-CoV-2 viral copies this assay can detect is 250 copies / mL. A negative result does not preclude SARS-CoV-2 infection and should not be used as the sole basis for treatment or other patient management decisions.  A negative result may occur with improper specimen collection / handling, submission of specimen other than nasopharyngeal swab, presence of viral mutation(s) within the areas targeted by this assay, and inadequate number of viral copies (<250 copies / mL). A negative result must be combined with clinical observations, patient history, and epidemiological information. Fact Sheet for Patients:   StrictlyIdeas.no Fact Sheet for Healthcare Providers: BankingDealers.co.za This test is not yet approved or cleared  by the [place] FDA and has been authorized for detection and/or diagnosis of  SARS-CoV-2 by FDA under an Emergency Use Authorization (EUA).  This EUA will remain in effect (meaning this test can be used) for the duration of the COVID-19 declaration under Section 564(b)(1) of the Act, 21 U.S.C. section 360bbb-3(b)(1), unless the authorization is terminated or revoked sooner. Performed at [facility], [place] [address], [place],  [address]     Procedures and diagnostic studies:  No results found.  Medications:   . acetaminophen (TYLENOL) oral liquid 160 mg/5 mL  650 mg Per Tube Q6H  . amLODipine  10 mg Per J Tube Daily  . enoxaparin (LOVENOX) injection  40 mg Subcutaneous Q24H  . fluticasone  1 spray Each Nare Daily  . folic acid  1 mg Per Tube Daily  . free water  100 mL Per Tube Q4H  . insulin aspart  0-15 Units Subcutaneous Q4H  . loratadine  10 mg Oral Daily  . metoCLOPramide  5 mg Per Tube TID  . thiamine  100 mg Per J Tube Daily   Continuous Infusions: . sodium chloride 50 mL/hr at 02/25/20 2111  . feeding supplement (OSMOLITE 1.5 CAL) 1,000 mL (02/25/20 2109)     LOS: 6 days   Binh Doten  [facility]     02/26/2020, 3:51 PM

## 2020-02-27 ENCOUNTER — Inpatient Hospital Stay: Payer: Self-pay | Admitting: Nutrition

## 2020-02-27 ENCOUNTER — Ambulatory Visit: Admit: 2020-02-27 | Payer: Self-pay | Admitting: Radiation Oncology

## 2020-02-27 ENCOUNTER — Inpatient Hospital Stay: Payer: Self-pay

## 2020-02-27 ENCOUNTER — Encounter: Payer: Self-pay | Admitting: Dietician

## 2020-02-27 LAB — GLUCOSE, CAPILLARY
Glucose-Capillary: 108 mg/dL — ABNORMAL HIGH (ref 70–99)
Glucose-Capillary: 113 mg/dL — ABNORMAL HIGH (ref 70–99)
Glucose-Capillary: 119 mg/dL — ABNORMAL HIGH (ref 70–99)
Glucose-Capillary: 121 mg/dL — ABNORMAL HIGH (ref 70–99)

## 2020-02-27 MED ORDER — OXYCODONE HCL 5 MG/5ML PO SOLN: 5.0000 mg | Freq: Four times a day (QID) | ORAL | 0 refills | Status: AC | PRN

## 2020-02-27 MED ORDER — OSMOLITE 1.5 CAL PO LIQD: 1000.0000 mL | ORAL | 0 refills | Status: DC

## 2020-02-27 MED ORDER — FOLIC ACID 1 MG PO TABS: 1.0000 mg | ORAL_TABLET | Freq: Every day | ORAL | 1 refills | Status: DC

## 2020-02-27 MED ORDER — LORATADINE 10 MG PO TABS: 10.0000 mg | ORAL_TABLET | Freq: Every day | ORAL | 0 refills | Status: DC

## 2020-02-27 MED ORDER — SIMETHICONE 40 MG/0.6ML PO SUSP: 40.0000 mg | Freq: Four times a day (QID) | ORAL | 0 refills | Status: DC | PRN

## 2020-02-27 MED ORDER — AMLODIPINE BESYLATE 10 MG PO TABS: 10.0000 mg | ORAL_TABLET | Freq: Every day | ORAL | 0 refills | Status: DC

## 2020-02-27 MED ORDER — THIAMINE HCL 100 MG PO TABS: 100.0000 mg | ORAL_TABLET | Freq: Every day | ORAL | 1 refills | Status: DC

## 2020-02-27 MED ORDER — METOCLOPRAMIDE HCL 5 MG PO TABS: 5.0000 mg | ORAL_TABLET | Freq: Three times a day (TID) | ORAL | 1 refills | Status: DC

## 2020-02-27 MED ORDER — ONDANSETRON 4 MG PO TBDP
4.0000 mg | ORAL_TABLET | Freq: Three times a day (TID) | ORAL | 0 refills | Status: DC | PRN
Start: 2020-02-27 — End: 2020-05-13

## 2020-02-27 MED FILL — SM INF GAS RELIEF 20 MG/0.3: 20 | 12 days supply | Qty: 30 | Fill #0

## 2020-02-27 MED FILL — oxyCODONE HCL 5 MG/5ML SOLN: 5 | 7 days supply | Qty: 50 | Fill #0

## 2020-02-27 MED FILL — AMLODIPINE BESYLATE 10 MG T: 10 | 30 days supply | Qty: 30 | Fill #0

## 2020-02-27 MED FILL — LORATADINE 10 MG TABLET: 10 | 30 days supply | Qty: 30 | Fill #0

## 2020-02-27 MED FILL — FOLIC ACID 1 MG TABS: 1 | 30 days supply | Qty: 30 | Fill #0

## 2020-02-27 MED FILL — METOCLOPRAMIDE 5 MG TABLET: 5 | 10 days supply | Qty: 30 | Fill #0

## 2020-02-27 MED FILL — ONDANSETRON ODT 4 MG TABLET: 4 | 6 days supply | Qty: 20 | Fill #0

## 2020-02-27 NOTE — Plan of Care (Signed)
  Problem: Education: Goal: Knowledge of General Education information will improve Description Including pain rating scale, medication(s)/side effects and non-pharmacologic comfort measures Outcome: Progressing   

## 2020-02-27 NOTE — Progress Notes (Addendum)
Nutrition Follow-up  RD working remotely.  DOCUMENTATION CODES:   Non-severe (moderate) malnutrition in context of chronic illness  INTERVENTION:   -Continue Osmolite 1.5@ 24ml/hr via j-tubeand increase by 10 ml every 8hours to goal rate of 50ml/hr.   Continue 100 ml free water flush every 4 hours  Tube feeding regimen provides2160kcal (100% of needs),90grams of protein, and 1065ml of H2O. Total free water: 1697 ml daily  TF recommendations for home:   -Nocturnal feedings of Osmolite 1.5 @ 90 ml/hr via j-tube over 16 hour period  100 ml free water flush every 4 hours  Regimen will provides 2160 kcals, 90 grams protein, and 1697 ml free water daily, meeting 100% of estimated kcal and protein needs.  NUTRITION DIAGNOSIS:   Moderate Malnutrition related to chronic illness, cancer and cancer related treatments as evidenced by mild fat depletion, mild muscle depletion, percent weight loss, energy intake < 75% for > or equal to 1 month.  Ongoing  GOAL:   Patient will meet greater than or equal to 90% of their needs  Progressing   MONITOR:   Labs, Weight trends, I & O's, TF tolerance, Supplement acceptance  REASON FOR ASSESSMENT:   Consult Assessment of nutrition requirement/status, Enteral/tube feeding initiation and management  ASSESSMENT:   49 y.o. female with medical history significant of esophageal squamous cell carcinoma, chronic alcohol abuse, chronic dysphagia/odynophagia/malnutrition, vitamin B12 deficiency who was sent from cancer center to [facility] long hospital with direct admission for placement of J-tube.  Patient been troubled with inability to tolerate solid food, weight loss, dysphagia/odynophagia due to her esophageal cancer.  Reviewed I/O's: +3 L x 24 hours and +11.2 L since admission  Attempted to speak with pt via phone, however, no answer.   Per chart review, last episode of nausea and vomiting since yesterday. Per RN, TF has been  increased to 50 ml/hr (goal rate of 60 ml/hr). She continues to receive 100 ml free water flushes every 4 hours. Regimen currently providing 1800 kclas, 75 grams protein, and 1514 ml free water daily, meeting 95% of estimated kcal needs and 79% of estimated protein needs.  Per MD notes, plan for discharge home today if able to tolerate TF advancement to goal rate of 60 ml/hr. RD communicated with [facility] RD regarding discharge plan.  Medications reviewed and include lovenox, folvite, and thiamine.   Labs reviewed: Mg, K, and Phos WDL (last taken 02/26/20). CBGS: 105-166 (inpatient orders for glycemic control are 0-15 units insulin aspart every 4 hours).   Diet Order:   Diet Order            Diet NPO time specified Except for: Sips with Meds  Diet effective midnight              EDUCATION NEEDS:   Education needs have been addressed  Skin:  Skin Assessment: Skin Integrity Issues: Skin Integrity Issues:: Incisions Incisions: closed abdomen  Last BM:  02/21/20  Height:   Ht Readings from Last 1 Encounters:  02/22/20 5\' 5"  (1.651 m)    Weight:   Wt Readings from Last 1 Encounters:  02/27/20 71.3 kg   BMI:  Body mass index is 26.16 kg/m.  Estimated Nutritional Needs:   Kcal:  1900-2100  Protein:  95-110g  Fluid:  2L/day    Loistine Chance, RD, LDN, [place] Registered Dietitian II Certified Diabetes Care and Education Specialist Please refer to [facility] for RD and/or RD on-call/weekend/after hours pager

## 2020-02-27 NOTE — Progress Notes (Signed)
Physical Therapy Treatment and Discharge Patient Details Name: Natalie Morrison MRN: [id number] DOB: January 04, 1971 Today's Date: 02/27/2020    History of Present Illness Natalie Morrison is a 49 y.o. female with medical history significant of esophageal squamous cell carcinoma, chronic alcohol abuse, chronic dysphagia/odynophagia/malnutrition, vitamin B12 deficiency who was sent from cancer center to [facility] with direct admission for placement of J-tube.    PT Comments    Pt has met mobility goals, is ambulating independently and safely >500' Pt able to complete 3 stairs without assist or increased pain. Discussed activity level upon return home as well as movement exercises to combat stiffness from being hospitalized. No further PT needs at this time.    Follow Up Recommendations  No PT follow up     Equipment Recommendations  None recommended by PT    Recommendations for Other Services       Precautions / Restrictions Precautions Precautions: Other (comment) Precaution Comments: New J-tube Restrictions Weight Bearing Restrictions: No    Mobility  Bed Mobility Overal bed mobility: Independent                Transfers Overall transfer level: Modified independent Equipment used: None Transfers: Sit to/from Stand Sit to Stand: Modified independent (Device/Increase time)         General transfer comment: pt stood safely without assistance and has been getting up and down in her room  Ambulation/Gait Ambulation/Gait assistance: Modified independent (Device/Increase time) Gait Distance (Feet): 500 Feet Assistive device: None Gait Pattern/deviations: Step-through pattern;Decreased stride length Gait velocity: decreased Gait velocity interpretation: >2.62 ft/sec, indicative of community ambulatory General Gait Details: pt still with cautious gait with minimal arm swing but mod I and safe without AD.    Stairs Stairs: Yes Stairs assistance: Modified  independent (Device/Increase time) Stair Management: One rail Left;Alternating pattern;Forwards Number of Stairs: 3 General stair comments: limited to 3 steps due to IV line, no difficulties with stairs   Wheelchair Mobility    Modified Rankin (Stroke Patients Only)       Balance Overall balance assessment: No apparent balance deficits (not formally assessed)                                          Cognition Arousal/Alertness: Awake/alert Behavior During Therapy: Flat affect Overall Cognitive Status: Within Functional Limits for tasks assessed                                        Exercises      General Comments General comments (skin integrity, edema, etc.): discussed movement exercises to perform at home to combat stiffness she feels from being in the hospital for a week      Pertinent Vitals/Pain Pain Assessment: Faces Faces Pain Scale: Hurts a little bit Pain Location: stomach, J-tube site Pain Descriptors / Indicators: Sore Pain Intervention(s): Limited activity within patient's tolerance    Home Living                      Prior Function            PT Goals (current goals can now be found in the care plan section) Acute Rehab PT Goals Patient Stated Goal: return home with husband PT Goal Formulation: With patient Time For Goal  Achievement: 03/10/20 Potential to Achieve Goals: Good Progress towards PT goals: Goals met/education completed, patient discharged from PT    Frequency    Min 3X/week      PT Plan Current plan remains appropriate    Co-evaluation              AM-PAC PT "6 Clicks" Mobility   Outcome Measure  Help needed turning from your back to your side while in a flat bed without using bedrails?: None Help needed moving from lying on your back to sitting on the side of a flat bed without using bedrails?: None Help needed moving to and from a bed to a chair (including a wheelchair)?:  None Help needed standing up from a chair using your arms (e.g., wheelchair or bedside chair)?: None Help needed to walk in hospital room?: None Help needed climbing 3-5 steps with a railing? : None 6 Click Score: 24    End of Session   Activity Tolerance: Patient tolerated treatment well Patient left: with call bell/phone within reach;in bed Nurse Communication: Mobility status PT Visit Diagnosis: Pain;Difficulty in walking, not elsewhere classified (R26.2) Pain - part of body: (abdomen)     Time: 5993-5701 PT Time Calculation (min) (ACUTE ONLY): 10 min  Charges:  $Gait Training: 8-22 mins                     Leighton Roach, PT  Acute Rehab Services  Pager [phone] Office [place] 02/27/2020, 2:11 PM

## 2020-02-27 NOTE — Discharge Summary (Signed)
Physician Discharge Summary  Natalie Morrison [id number] DOB: 1971-05-16 DOA: 02/20/2020  PCP: Eulas Post, MD  Admit date: 02/20/2020 Discharge date: 02/27/2020  Admitted From: Home Disposition: Home  Recommendations for Outpatient Follow-up:  1. Follow up with PCP in 1-2 weeks 2. Please obtain BMP/CBC in one week your next doctors visit.  3. All home meds have been prescribed education regarding J-tube feeding has been given.  Advised to periodically check blood glucose at home 4. Lantus and NovoLog prescribed  Discharge Condition: Stable CODE STATUS: Full Diet recommendation: Tube feedings  Brief/Interim Summary:  49 y.o. female with a history of esophageal squamous cell carcinoma, chronic alcohol abuse, chronic dysphagia/odynophagia/malnutrition, vitamin B12 deficiency who was sent from cancer center to [facility] long hospital with direct admission for placement ofJ-tube due to worsening po intake. General surgery requested transfer to [facility] and placed J-tube 5/20. Feeds have been started and are being slowly increased.  Unfortunately, upward titration of enteral nutrition was limited by nausea and vomiting.  This slowly improved with antiemetics and reached her goal on 02/27/2020. She is wanting to go home today.  Moderate protein calorie malnutrition and failure to thrive:  - s/p jejunostomy tube placement 5/20.  Currently she is tolerating tube feeds without any issues at 60 cc/h.  Antiemetics seem to be helping her well.  She will have outpatient follow-up with Dr. Bobbye Morton   Squamous cell esophageal cancer:Affecting mid esophagus. EGD had shown partially obstructing tumor, biopsy had confirmed invasive squamous cell carcinoma, locally advanced disease with enlarged lymph nodes lateralto the GEJ - Staging PET scan is planned -Oncology plans to start on concurrent chemoradiation with Taxol and carboplatin in 2 weeks. Dr Burr Medico aware of d/c plans. Spoke with her  today  Hypertension: Continue Norvasc.  Alcohol use disorder: No evidence of alcohol withdrawal syndrome.  CIWA protocol has been discontinued.   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  Tolerating tube feeds without any issues at 60 cc/h.  Discharge Exam: Vitals:   02/26/20 2041 02/27/20 0453  BP: (!) 138/95 (!) 157/94  Pulse: 96 93  Resp: 16 16  Temp: 98.3 F (36.8 C) 98.4 F (36.9 C)  SpO2: 98% 98%   Vitals:   02/26/20 1400 02/26/20 2041 02/27/20 0453 02/27/20 0500  BP: (!) 154/95 (!) 138/95 (!) 157/94   Pulse: 93 96 93   Resp: 18 16 16    Temp: 98.3 F (36.8 C) 98.3 F (36.8 C) 98.4 F (36.9 C)   TempSrc: Oral Oral Oral   SpO2: 97% 98% 98%   Weight:    71.3 kg  Height:        General: Pt is alert, awake, not in acute distress Cardiovascular: RRR, S1/S2 +, no rubs, no gallops Respiratory: CTA bilaterally, no wheezing, no rhonchi Abdominal: Soft, NT, ND, bowel sounds + Extremities: no edema, no cyanosis J-tube noted without evidence of active infection.  Discharge Instructions   Allergies as of 02/27/2020      Reactions   Nitrofurantoin Hives   Sulfonamide Derivatives Other (See Comments)   Unknown allergic reaction per husband       Medication List    TAKE these medications   acetaminophen 325 MG tablet Commonly known as: TYLENOL Take 650 mg by mouth every 6 (six) hours  as needed for mild pain or headache.   amLODipine 10 MG tablet Commonly known as: NORVASC 1 tablet (10 mg total) by Per J Tube route daily. Start taking on: Feb 28, 2020   feeding supplement (OSMOLITE 1.5 CAL) Liqd Place 1,000 mLs into feeding tube continuous.   folic acid 1 MG tablet Commonly known as: FOLVITE Place 1 tablet (1 mg total) into feeding  tube daily. Start taking on: Feb 28, 2020   HYDROcodone-acetaminophen 7.5-325 mg/15 ml solution Commonly known as: HYCET Take 5-10 mLs by mouth every 8 (eight) hours as needed for moderate pain or severe pain. Take 30 minutes before meal for severe pain   loratadine 10 MG tablet Commonly known as: CLARITIN Take 1 tablet (10 mg total) by mouth daily. Start taking on: Feb 28, 2020   metoCLOPramide 5 MG tablet Commonly known as: REGLAN Place 1 tablet (5 mg total) into feeding tube 3 (three) times daily.   ondansetron 4 MG disintegrating tablet Commonly known as: Zofran ODT Take 1 tablet (4 mg total) by mouth every 8 (eight) hours as needed for nausea or vomiting.   oxyCODONE 5 MG/5ML solution Commonly known as: ROXICODONE Place 5 mLs (5 mg total) into feeding tube every 6 (six) hours as needed for up to 7 days for severe pain.   simethicone 40 MG/0.6ML drops Commonly known as: MYLICON Place 0.6 mLs (40 mg total) into feeding tube 4 (four) times daily as needed (bloating).   thiamine 100 MG tablet 1 tablet (100 mg total) by Per J Tube route daily. Start taking on: Feb 28, 2020            Durable Medical Equipment  (From admission, onward)         Start     Ordered   02/22/20 1416  For home use only DME Tube feeding pump  Once    Question:  Length of Need  Answer:  Lifetime   02/22/20 1416   02/22/20 1413  For home use only DME Tube feeding  Once    Comments: Non-severe (moderate) malnutrition in context of chronic illness  INTERVENTION:   Monitor magnesium, potassium, and phosphorus daily for at least 3 days, MD to replete as needed, as pt is at risk for refeeding syndrome.  Once tube ready to use: -Initiate Osmolite 1.5 @  60 ml/hr x 14 hours. -Recommend free water flushes of 100 ml every 4 hours -Goal rate will provide 1260 kcals, 52g protein and 1240 ml H2O  -Recommend Ensure Enlive or equivalent  po TID, each supplement provides 350 kcal and 20 grams of  protein   NUTRITION DIAGNOSIS:   Moderate Malnutrition related to chronic illness, cancer and cancer related treatments as evidenced by mild fat depletion, mild muscle depletion, percent weight loss, energy intake < 75% for > or equal to 1 month.  GOAL:   Patient will meet greater than or equal to 90% of their needs  Patient will need tube feeds greater than 90 days   02/22/20 1416         Follow-up Information    [facility]. Go to.   Why: March 12, 2020 at 1:30 pm  Contact information: [place] [phone]       Jesusita Oka, MD. Go on 03/21/2020.   Specialty: Surgery Why: Your appointment is 6/17 at 9:30am Please arrive 15 minutes prior to your appointment to check in. Contact information: [place]  [address] [place] [address] [phone]        [facility], [place]. Go on 03/05/2020.   Specialty: General Surgery Why: Your appointment is 6/1 at 9am for staple removal Please arrive 30 minutes prior to your appointment to check in and fill out paperwork. Bring photo ID and insurance information. Contact information: [address] [place] Follow up.   Specialty: Home Health Services Why: Provide home health nurse  Contact information: [address] [place] [address] [phone]        [facility] Follow up.   Why: provide tube feeding  Contact information: [address] [place] [address] [phone]          Allergies  Allergen Reactions  . Nitrofurantoin Hives  . Sulfonamide Derivatives Other (See Comments)    Unknown allergic reaction per husband     You were cared for by a hospitalist during your hospital stay. If you have any questions about your discharge medications or the care you received while you were  in the hospital after you are discharged, you can call the unit and asked to speak with the hospitalist on call if the hospitalist that took care of you is not available. Once you are discharged, your primary care physician will handle any further medical issues. Please note that no refills for any discharge medications will be authorized once you are discharged, as it is imperative that you return to your primary care physician (or establish a relationship with a primary care physician if you do not have one) for your aftercare needs so that they can reassess your need for medications and monitor your lab values.   Procedures/Studies: CT CHEST W CONTRAST  Result Date: 02/14/2020 CLINICAL DATA:  Dysphagia for 2 months. Esophageal mass. 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 EXAM: CT CHEST, ABDOMEN, AND PELVIS WITH CONTRAST TECHNIQUE: Multidetector CT imaging of the chest, abdomen and pelvis was performed following the standard protocol during bolus administration of intravenous contrast. CONTRAST:  171mL OMNIPAQUE IOHEXOL 300 MG/ML  SOLN COMPARISON:  Chest radiographs 08/24/2018, chest CT 02/22/2018 and abdominal CT 08/23/2009. No available endoscopy report. FINDINGS: CT CHEST FINDINGS Cardiovascular: No acute vascular findings. Mild atherosclerosis of the aorta, great vessels and coronary arteries. The heart size is normal. There is no pericardial effusion. Mediastinum/Nodes: There are no enlarged mediastinal, hilar or axillary lymph nodes. There is irregular wall thickening and an ill-defined mass involving the distal esophagus, inferior to the carina. This extends approximately 6.1 cm in length on sagittal image 91/5. No proximal esophageal dilatation or air-fluid levels. Mild indentation of the posterior wall of the left atrium without definite cardiovascular invasion. Lungs/Pleura: There is no pleural effusion or pneumothorax. Interval resolution of the extensive ground-glass and  airspace opacities in both lungs consistent with resolved pneumonia. No suspicious pulmonary nodules. Musculoskeletal/Chest wall: No chest wall mass or suspicious osseous findings. CT ABDOMEN AND PELVIS FINDINGS Hepatobiliary: The liver is normal in density without suspicious focal abnormality. No evidence of gallstones, gallbladder wall thickening or biliary dilatation. Pancreas: Diffusely atrophied. No ductal dilatation or surrounding inflammation. Spleen: Normal in size without focal abnormality. Adrenals/Urinary Tract: Both adrenal glands appear normal. The kidneys appear normal without evidence of urinary tract calculus, suspicious lesion or hydronephrosis. No bladder abnormalities are seen. Stomach/Bowel: No evidence of bowel wall thickening, distention or surrounding  inflammatory change. No gastric mass identified. Moderate stool in the distal colon. Vascular/Lymphatic: There is an enlarged lymph node lateral to the gastroesophageal junction, superior to the gastric fundus, measuring 10 mm short axis on image 42/2. There is a prominent lymph node in the gastrohepatic ligament measuring 8 mm on image 51/2. No other enlarged abdominopelvic lymph nodes. No acute vascular findings. Aortic and branch vessel atherosclerosis. The portal, superior mesenteric and splenic veins are patent. Reproductive: The uterus and ovaries appear normal. No adnexal mass. Other: No ascites or peritoneal nodularity. Musculoskeletal: No acute osseous findings or evidence of metastatic disease. 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 Moderate lower lumbar spondylosis. IMPRESSION: 1. Irregular wall thickening and an ill-defined mass involving the distal esophagus consistent with known esophageal cancer. 2. Mildly enlarged lymph nodes lateral to the gastroesophageal junction and in the gastrohepatic ligament, suspicious for metastatic disease. 3. No evidence of distant metastatic disease. 4. 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 5. Aortic Atherosclerosis (ICD10-I70.0). Electronically Signed   By: Richardean Sale M.D.   On: 02/14/2020 16:48   CT ABDOMEN PELVIS W CONTRAST  Result Date: 02/14/2020 CLINICAL DATA:  Dysphagia for 2 months. Esophageal mass. 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 EXAM: CT CHEST, ABDOMEN, AND PELVIS WITH CONTRAST TECHNIQUE: Multidetector CT imaging of the chest, abdomen and pelvis was performed following the standard protocol during bolus administration of intravenous contrast. CONTRAST:  143mL OMNIPAQUE IOHEXOL 300 MG/ML  SOLN COMPARISON:  Chest radiographs 08/24/2018, chest CT 02/22/2018 and abdominal CT 08/23/2009. No available endoscopy report. FINDINGS: CT CHEST FINDINGS Cardiovascular: No acute vascular findings. Mild atherosclerosis of the aorta, great vessels and coronary arteries. The heart size is normal. There is no pericardial effusion. Mediastinum/Nodes: There are no enlarged mediastinal, hilar or axillary lymph nodes. There is irregular wall thickening and an ill-defined mass involving the distal esophagus, inferior to the carina. This extends approximately 6.1 cm in length on sagittal image 91/5. No proximal esophageal dilatation or air-fluid levels. Mild indentation of the posterior wall of the left atrium without definite cardiovascular invasion. Lungs/Pleura: There is no pleural effusion or pneumothorax. Interval resolution of the extensive ground-glass and airspace opacities in both lungs consistent with resolved pneumonia. No suspicious pulmonary nodules. Musculoskeletal/Chest wall: No chest wall mass or suspicious osseous findings. CT ABDOMEN AND PELVIS FINDINGS Hepatobiliary: The liver is normal in density without suspicious focal abnormality. No evidence of gallstones, gallbladder wall thickening or biliary dilatation. Pancreas: Diffusely atrophied. No ductal dilatation or surrounding inflammation. Spleen:  Normal in size without focal abnormality. Adrenals/Urinary Tract: Both adrenal glands appear normal. The kidneys appear normal without evidence of urinary tract calculus, suspicious lesion or hydronephrosis. No bladder abnormalities are seen. Stomach/Bowel: No evidence of bowel wall thickening, distention or surrounding inflammatory change. No gastric mass identified. Moderate stool in the distal colon. Vascular/Lymphatic: There is an enlarged lymph node lateral to the gastroesophageal junction, superior to the gastric fundus, measuring 10 mm short axis on image 42/2. There is a prominent lymph node in the gastrohepatic ligament measuring 8 mm on image 51/2. No other enlarged abdominopelvic lymph nodes. No acute vascular findings. Aortic and branch vessel atherosclerosis. The portal, superior mesenteric and splenic veins are patent. Reproductive: The uterus and ovaries appear normal. No adnexal mass. Other: No ascites or peritoneal nodularity. Musculoskeletal: No acute osseous findings or evidence of metastatic disease. 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 Moderate lower lumbar spondylosis. IMPRESSION: 1. Irregular wall thickening and an  ill-defined mass involving the distal esophagus consistent with known esophageal cancer. 2. Mildly enlarged lymph nodes lateral to the gastroesophageal junction and in the gastrohepatic ligament, suspicious for metastatic disease. 3. No evidence of distant metastatic disease. 4. 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 5. Aortic Atherosclerosis (ICD10-I70.0). Electronically Signed   By: Richardean Sale M.D.   On: 02/14/2020 16:48      The results of significant diagnostics from this hospitalization (including imaging, microbiology, ancillary and laboratory) are listed below for reference.     Microbiology: Recent Results (from the past 240 hour(s))  SARS Coronavirus 2 by RT PCR (hospital order, performed in  [facility] hospital lab) Nasopharyngeal Nasopharyngeal Swab     Status: None   Collection Time: 02/20/20  5:28 PM   Specimen: Nasopharyngeal Swab  Result Value Ref Range Status   SARS Coronavirus 2 NEGATIVE NEGATIVE Final    Comment: (NOTE) SARS-CoV-2 target nucleic acids are NOT DETECTED. The SARS-CoV-2 RNA is generally detectable in upper and lower respiratory specimens during the acute phase of infection. The lowest concentration of SARS-CoV-2 viral copies this assay can detect is 250 copies / mL. A negative result does not preclude SARS-CoV-2 infection and should not be used as the sole basis for treatment or other patient management decisions.  A negative result may occur with improper specimen collection / handling, submission of specimen other than nasopharyngeal swab, presence of viral mutation(s) within the areas targeted by this assay, and inadequate number of viral copies (<250 copies / mL). A negative result must be combined with clinical observations, patient history, and epidemiological information. Fact Sheet for Patients:   StrictlyIdeas.no Fact Sheet for Healthcare Providers: BankingDealers.co.za This test is not yet approved or cleared  by the [place] FDA and has been authorized for detection and/or diagnosis of SARS-CoV-2 by FDA under an Emergency Use Authorization (EUA).  This EUA will remain in effect (meaning this test can be used) for the duration of the COVID-19 declaration under Section 564(b)(1) of the Act, 21 U.S.C. section 360bbb-3(b)(1), unless the authorization is terminated or revoked sooner. Performed at [facility], [place] [address] [place], [place] [address]      Labs: BNP (last 3 results) No results for input(s): BNP in the last 8760 hours. Basic Metabolic Panel: Recent Labs  Lab 02/22/20 0535 02/23/20 1017 02/24/20 0745 02/25/20 0712 02/26/20 0309  NA 135 138 140  142 143  K 3.6 3.6 3.5 3.1* 4.4  CL 103 105 103 102 111  CO2 23 24 25 26 24   GLUCOSE 102* 149* 149* 138* 131*  BUN 8 7 6 15 15   CREATININE 0.42* 0.70 0.61 0.60 0.60  CALCIUM 8.8* 9.1 9.1 8.8* 8.8*  MG  --  1.5* 1.6* 2.3 1.8  PHOS  --  2.7 4.1 3.9 2.5   Liver Function Tests: Recent Labs  Lab 02/23/20 1017 02/24/20 0745 02/25/20 0712  AST 22 22 20   ALT 34 31 22  ALKPHOS 49 54 55  BILITOT 0.6 0.6 0.2*  PROT 6.0* 6.5 5.9*  ALBUMIN 2.8* 3.0* 2.7*   No results for input(s): LIPASE, AMYLASE in the last 168 hours. No results for input(s): AMMONIA in the last 168 hours. CBC: Recent Labs  Lab 02/21/20 0513 02/22/20 0535 02/24/20 0745 02/25/20 0712 02/26/20 0309  WBC 7.0 8.0 14.0* 12.4* 11.9*  NEUTROABS  --  3.9  --   --  7.0  HGB 12.0 12.5 13.6 13.1 12.5  HCT 35.2* 36.5 39.7 38.7  37.5  MCV 105.4* 106.7* 105.0* 106.0* 107.4*  PLT 168 174 240 279 295   Cardiac Enzymes: No results for input(s): CKTOTAL, CKMB, CKMBINDEX, TROPONINI in the last 168 hours. BNP: Invalid input(s): POCBNP CBG: Recent Labs  Lab 02/26/20 2034 02/27/20 0020 02/27/20 0451 02/27/20 0802 02/27/20 1224  GLUCAP 105* 108* 119* 113* 121*   D-Dimer No results for input(s): DDIMER in the last 72 hours. Hgb A1c No results for input(s): HGBA1C in the last 72 hours. Lipid Profile No results for input(s): CHOL, HDL, LDLCALC, TRIG, CHOLHDL, LDLDIRECT in the last 72 hours. Thyroid function studies No results for input(s): TSH, T4TOTAL, T3FREE, THYROIDAB in the last 72 hours.  Invalid input(s): FREET3 Anemia work up No results for input(s): VITAMINB12, FOLATE, FERRITIN, TIBC, IRON, RETICCTPCT in the last 72 hours. Urinalysis    Component Value Date/Time   COLORURINE STRAW (A) 02/22/2018 0300   APPEARANCEUR CLEAR 02/22/2018 0300   LABSPEC 1.003 (L) 02/22/2018 0300   PHURINE 5.0 02/22/2018 0300   GLUCOSEU NEGATIVE 02/22/2018 0300   HGBUR NEGATIVE 02/22/2018 0300   HGBUR negative 01/01/2010 1340    BILIRUBINUR NEGATIVE 02/22/2018 0300   BILIRUBINUR 3+ 03/18/2016 1614   KETONESUR NEGATIVE 02/22/2018 0300   PROTEINUR NEGATIVE 02/22/2018 0300   UROBILINOGEN 2.0 03/18/2016 1614   UROBILINOGEN 0.2 05/06/2013 1440   NITRITE NEGATIVE 02/22/2018 0300   LEUKOCYTESUR NEGATIVE 02/22/2018 0300   Sepsis Labs Invalid input(s): PROCALCITONIN,  WBC,  LACTICIDVEN Microbiology Recent Results (from the past 240 hour(s))  SARS Coronavirus 2 by RT PCR (hospital order, performed in [place] hospital lab) Nasopharyngeal Nasopharyngeal Swab     Status: None   Collection Time: 02/20/20  5:28 PM   Specimen: Nasopharyngeal Swab  Result Value Ref Range Status   SARS Coronavirus 2 NEGATIVE NEGATIVE Final    Comment: (NOTE) SARS-CoV-2 target nucleic acids are NOT DETECTED. The SARS-CoV-2 RNA is generally detectable in upper and lower respiratory specimens during the acute phase of infection. The lowest concentration of SARS-CoV-2 viral copies this assay can detect is 250 copies / mL. A negative result does not preclude SARS-CoV-2 infection and should not be used as the sole basis for treatment or other patient management decisions.  A negative result may occur with improper specimen collection / handling, submission of specimen other than nasopharyngeal swab, presence of viral mutation(s) within the areas targeted by this assay, and inadequate number of viral copies (<250 copies / mL). A negative result must be combined with clinical observations, patient history, and epidemiological information. Fact Sheet for Patients:   StrictlyIdeas.no Fact Sheet for Healthcare Providers: BankingDealers.co.za This test is not yet approved or cleared  by the [place] FDA and has been authorized for detection and/or diagnosis of SARS-CoV-2 by FDA under an Emergency Use Authorization (EUA).  This EUA will remain in effect (meaning this test can be used) for the  duration of the COVID-19 declaration under Section 564(b)(1) of the Act, 21 U.S.C. section 360bbb-3(b)(1), unless the authorization is terminated or revoked sooner. Performed at [facility], [place] [address], [place], [place] [address]      Time coordinating discharge:  I have spent 35 minutes face to face with the patient and on the ward discussing the patients care, assessment, plan and disposition with other care givers. >50% of the time was devoted counseling the patient about the risks and benefits of treatment/Discharge disposition and coordinating care.   SIGNED:   Damita Lack, MD  [facility] 02/27/2020, 1:38 PM  If 7PM-7AM, please contact night-coverage

## 2020-02-27 NOTE — TOC Transition Note (Addendum)
Transition of Care [facility]) - CM/SW Discharge Note   Patient Details  Name: Natalie Morrison MRN: [id number] Date of Birth: August 15, 1971  [facility] CM/SW Contact:  Marilu Favre, RN Phone Number: 02/27/2020, 12:02 PM   Clinical Narrative:      [facility]ie [facility] with [facility] aware discharge is today.   Patient states [facility] has not delivered tube feeds. Zack with [facility] checking on tube feeding. Zach with [facility] will have tube feedings delivered to home.        Patient Goals and CMS Choice Patient states their goals for this hospitalization and ongoing recovery are:: to return to home CMS Medicare.gov Compare Post Acute Care list provided to:: Patient Choice offered to / list presented to : Patient  Discharge Placement                       Discharge Plan and Services   Discharge Planning Services: CM Consult Post Acute Care Choice: Home Health          DME Arranged: Tube feeding DME Agency: [employer] Date DME Agency Contacted: 02/22/20 Time DME Agency Contacted: [phone]   [place]: RN [place] Agency: [facility] Date [place] Agency Contacted: 02/22/20 Time [place]: 1403 Representative spoke with at [place]: [place] awaiting call back to see if  Social Determinants of Health (SDOH) Interventions     Readmission Risk Interventions No flowsheet data found.

## 2020-02-27 NOTE — Progress Notes (Signed)
Pharmacist Chemotherapy Monitoring - Initial Assessment    Anticipated start date: 03/05/20  Regimen:  . Are orders appropriate based on the patient's diagnosis, regimen, and cycle? Yes . Does the plan date match the patient's scheduled date? Yes . Is the sequencing of drugs appropriate? Yes . Are the premedications appropriate for the patient's regimen? Yes . Prior Authorization for treatment is: Uninsured o If applicable, is the correct biosimilar selected based on the patient's insurance? not applicable  Organ Function and Labs: Marland Kitchen Are dose adjustments needed based on the patient's renal function, hepatic function, or hematologic function? No . Are appropriate labs ordered prior to the start of patient's treatment? Yes . Other organ system assessment, if indicated: N/A . The following baseline labs, if indicated, have been ordered: N/A  Dose Assessment: . Are the drug doses appropriate? Yes . Are the following correct: o Drug concentrations Yes o IV fluid compatible with drug Yes o Administration routes Yes o Timing of therapy Yes . If applicable, does the patient have documented access for treatment and/or plans for port-a-cath placement? no . If applicable, have lifetime cumulative doses been properly documented and assessed? yes Lifetime Dose Tracking  No doses have been documented on this patient for the following tracked chemicals: Doxorubicin, Epirubicin, Idarubicin, Daunorubicin, Mitoxantrone, Bleomycin, Oxaliplatin, Carboplatin, Liposomal Doxorubicin  o   Toxicity Monitoring/Prevention: . The patient has the following take home antiemetics prescribed: Ondansetron . The patient has the following take home medications prescribed: N/A and mesna for cyclophosphamide . Medication allergies and previous infusion related reactions, if applicable, have been reviewed and addressed. Yes . The patient's current medication list has been assessed for drug-drug interactions with their  chemotherapy regimen. no significant drug-drug interactions were identified on review.  Order Review: . Are the treatment plan orders signed? Yes . Is the patient scheduled to see a provider prior to their treatment? Yes  I verify that I have reviewed each item in the above checklist and answered each question accordingly.  Romualdo Bolk [facility] 02/27/2020 1:28 PM

## 2020-02-27 NOTE — Progress Notes (Unsigned)
Nutrition  Patient scheduled for nutrition assessment today. Appointment cancelled, patient admitted to hospital 5/18 for failure to thrive in adult secondary to squamous cell esophageal cancer and is s/p J-tube placement on 5/20. Patient to discharge from hospital today, will follow-up as able.     Lajuan Lines, RD, LDN Clinical Nutrition After Hours/Weekend Pager # in [place]

## 2020-02-28 ENCOUNTER — Telehealth: Payer: Self-pay | Admitting: *Deleted

## 2020-02-28 NOTE — Telephone Encounter (Signed)
Called patient to inform of Pet Scan for 03-01-20 - arrival time- 8:30 am @ [facility], pt. to avoid carbs day before test, patient to be NPO- 6 hrs. prior to test, patient stated that she will go over things with her case worker this afternoon and she will let me know what she will do, notified Shona Simpson

## 2020-02-29 ENCOUNTER — Telehealth: Payer: Self-pay | Admitting: *Deleted

## 2020-02-29 ENCOUNTER — Other Ambulatory Visit: Payer: Self-pay

## 2020-02-29 ENCOUNTER — Ambulatory Visit
Admission: RE | Admit: 2020-02-29 | Discharge: 2020-02-29 | Disposition: A | Discharge status: Home / Self Care | Payer: Self-pay | Source: Ambulatory Visit | Attending: Radiation Oncology | Admitting: Radiation Oncology

## 2020-02-29 DIAGNOSIS — C154 Malignant neoplasm of middle third of esophagus: Secondary | ICD-10-CM | POA: Insufficient documentation

## 2020-02-29 NOTE — Telephone Encounter (Signed)
Called patient to inform that appt. with Dr. Kipp Brood is being cancelled and rescheduled, and I will let her know when it is rescheduled, also I informed her of the Pet Scan for 03-01-20 - arrival time- 8:30 am @ [facility], patient to be NPO- 6 hrs. prior to test and patient to avoid carbs 12 hrs. prior to test, test to be @ [facility], spoke with patient and she is aware of these things

## 2020-03-01 ENCOUNTER — Ambulatory Visit (HOSPITAL_COMMUNITY)
Admission: RE | Admit: 2020-03-01 | Discharge: 2020-03-01 | Disposition: A | Discharge status: Home / Self Care | Payer: Self-pay | Source: Ambulatory Visit | Attending: Radiation Oncology | Admitting: Radiation Oncology

## 2020-03-01 ENCOUNTER — Encounter: Payer: Self-pay | Admitting: Thoracic Surgery (Cardiothoracic Vascular Surgery)

## 2020-03-01 DIAGNOSIS — C154 Malignant neoplasm of middle third of esophagus: Secondary | ICD-10-CM | POA: Insufficient documentation

## 2020-03-01 LAB — GLUCOSE, CAPILLARY: Glucose-Capillary: 121 mg/dL — ABNORMAL HIGH (ref 70–99)

## 2020-03-01 MED ORDER — FLUDEOXYGLUCOSE F - 18 (FDG) INJECTION
7.8000 | Freq: Once | INTRAVENOUS | Status: AC | PRN
  Administered 2020-03-01: 7.8 via INTRAVENOUS

## 2020-03-01 NOTE — Progress Notes (Deleted)
[name]       [place] [address]             [phone]                    Natalie Morrison [name] Date of Birth: October 14, 1970  Referring: Truitt Merle, MD Primary Care: Eulas Post, MD Primary Cardiologist: No primary care provider on file.  Chief Complaint:   No chief complaint on file.   History of Present Illness:    Natalie Morrison 49 y.o. female ***    Smoking Hx: ***   Zubrod Score: At the time of surgery this patient's most appropriate activity status/level should be described as: []     0    Normal activity, no symptoms []     1    Restricted in physical strenuous activity but ambulatory, able to do out light work []     2    Ambulatory and capable of self care, unable to do work activities, up and about               >50 % of waking hours                              []     3    Only limited self care, in bed greater than 50% of waking hours []     4    Completely disabled, no self care, confined to bed or chair []     5    Moribund   Past Medical History:  Diagnosis Date  . Alcohol abuse   . Anemia   . Anxiety   . Anxiety and depression   . Asthma   . Depression   . Hepatitis A    "when I was a kid"  . Hypertension     Past Surgical History:  Procedure Laterality Date  . BREAST LUMPECTOMY Right   . GASTROSTOMY N/A 02/22/2020   Procedure: OPEN PLACEMENT JEJUNOSTOMY FEEDING TUBE;  Surgeon: Jesusita Oka, MD;  Location: [facility];  Service: General;  Laterality: N/A;  . TONSILLECTOMY AND ADENOIDECTOMY Bilateral over 30 years ago  . UPPER GASTROINTESTINAL ENDOSCOPY      Family History  Problem Relation Age of Onset  . Heart disease Mother   . Liver disease Mother   . Heart disease Father   . Cancer Maternal Grandmother        unknown type cancer   . Colon cancer Neg Hx   . Esophageal cancer Neg Hx   . Rectal cancer Neg Hx   . Stomach cancer Neg Hx      Social History   Tobacco Use    Smoking Status Former Smoker  . Packs/day: 1.00  . Years: 33.00  . Pack years: 33.00  . Types: Cigarettes  . Quit date: 02/01/2018  . Years since quitting: 2.0  Smokeless Tobacco Never Used    Social History   Substance and Sexual Activity  Alcohol Use Yes  . Alcohol/week: 2.0 - 3.0 standard drinks  . Types: 2 - 3 Shots of liquor per week   Comment: 2-3 drinks per day for 30 years, plan to stop      Allergies  Allergen Reactions  . Nitrofurantoin Hives  . Sulfonamide Derivatives Other (See Comments)    Unknown allergic reaction per husband     Current Outpatient Medications  Medication Sig Dispense Refill  . acetaminophen (TYLENOL) 325 MG tablet Take 650 mg by mouth every 6 (six) hours as needed for mild pain or headache.    Marland Kitchen amLODipine (NORVASC) 10 MG tablet 1 tablet (10 mg total) by Per J Tube route daily. 30 tablet 0  . folic acid (FOLVITE) 1 MG tablet Place 1 tablet (1 mg total) into feeding tube daily. 30 tablet 1  . HYDROcodone-acetaminophen (HYCET) 7.5-325 mg/15 ml solution Take 5-10 mLs by mouth every 8 (eight) hours as needed for moderate pain or severe pain. Take 30 minutes before meal for severe pain 120 mL 0  . loratadine (CLARITIN) 10 MG tablet Take 1 tablet (10 mg total) by mouth daily. 30 tablet 0  . metoCLOPramide (REGLAN) 5 MG tablet Place 1 tablet (5 mg total) into feeding tube 3 (three) times daily. 30 tablet 1  . Nutritional Supplements (FEEDING SUPPLEMENT, OSMOLITE 1.5 CAL,) LIQD Place 1,000 mLs into feeding tube continuous.  0  . ondansetron (ZOFRAN ODT) 4 MG disintegrating tablet Take 1 tablet (4 mg total) by mouth every 8 (eight) hours as needed for nausea or vomiting. 20 tablet 0  . oxyCODONE (ROXICODONE) 5 MG/5ML solution Place 5 mLs (5 mg total) into feeding tube every 6 (six) hours as needed for up to 7 days for severe pain. 50 mL 0  . simethicone (MYLICON) 40 99991111 drops Place 0.6 mLs (40 mg total) into feeding tube 4 (four) times daily as needed  (bloating). 30 mL 0  . thiamine 100 MG tablet 1 tablet (100 mg total) by Per J Tube route daily. 30 tablet 1   Current Facility-Administered Medications  Medication Dose Route Frequency Provider Last Rate Last Admin  . 0.9 %  sodium chloride infusion  500 mL Intravenous Once Nandigam, Kavitha V, MD        ROS   PHYSICAL EXAMINATION: There were no vitals taken for this visit. Physical Exam  Diagnostic Studies & Laboratory data:    CT Scan: *** PET/CT: *** EGD/EUS: *** Findings: 02/08/20 - A large, fungating and ulcerating mass with bleeding and stigmata of recent bleeding was found in the middle third of the esophagus, 28 cm from the incisors. The mass was partially obstructing, unable to extend scope beyond the lesion to determine the extent. Biopsies were taken with a cold forceps for histology. - Food/pills was found in the middle third of the esophagus. Removal of food was Accomplished.       Path: Esophagus, biopsy, mass - INVASIVE SQUAMOUS CELL CARCINOMA  Radiation Hx: ***     I have independently reviewed the above radiology studies  and reviewed the findings with the patient.   Recent Lab Findings: Lab Results  Component Value Date   WBC 11.9 (H) 02/26/2020   HGB 12.5 02/26/2020   HCT 37.5 02/26/2020   PLT 295 02/26/2020   GLUCOSE 131 (H) 02/26/2020   CHOL 169 02/25/2018   TRIG 202 (H) 02/25/2018   HDL 25 (L) 02/25/2018   LDLDIRECT 183.0 09/05/2012   LDLCALC 104 (H) 02/25/2018   ALT 22 02/25/2020   AST 20 02/25/2020   NA 143 02/26/2020   K 4.4 02/26/2020   CL 111 02/26/2020   CREATININE 0.60 02/26/2020   BUN 15 02/26/2020   CO2 24 02/26/2020   TSH 3.742 02/13/2018   INR 1.16 02/22/2018   HGBA1C 5.2 02/23/2020       Problem List: ***  Assessment / Plan:   ***     I  spent {CHL ONC TIME VISIT - [id number] with  the patient face to face and greater then 50% of the time was spent in counseling and coordination of care.    Lajuana Matte 03/01/2020 9:22 AM

## 2020-03-03 NOTE — Progress Notes (Signed)
[place]   Telephone:([phone]) [phone] Fax:(336) [phone]   Clinic Follow up Note   Patient Care Team: Eulas Post, MD as PCP - General Jonnie Finner, RN as Oncology Nurse Navigator Truitt Merle, MD as Consulting Physician (Hematology) Michael Boston, MD as Consulting Physician (General Surgery) Kyung Rudd, MD as Consulting Physician (Radiation Oncology) Mauri Pole, MD as Consulting Physician (Gastroenterology) Jackelyn Knife, MD as Rounding Team (Internal Medicine) 03/05/2020  CHIEF COMPLAINT: F/u esophageal cancer   SUMMARY OF ONCOLOGIC HISTORY: Oncology History Overview Note  Cancer Staging Esophageal cancer [facility]) Staging form: Esophagus - Other Histologies, AJCC 8th Edition - Clinical stage from 02/11/2020: cTX, cN1, cM0 - Signed by Truitt Merle, MD on 02/14/2020    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 Biopsied. - Food in the middle third of the esophagus. Removal was successful.   02/08/2020 Initial Biopsy   Diagnosis 02/08/20 Esophagus, biopsy, mass - INVASIVE SQUAMOUS CELL CARCINOMA - SEE COMMENT Microscopic Comment Based on the biopsy, the carcinoma appears moderately differentiated. Dr. Silverio Decamp was paged on Feb 09, 2020. Dr. Jeannie Done reviewed the case and agrees with the above diagnosis.    02/11/2020 Cancer Staging   Staging form: Esophagus - Other Histologies, AJCC 8th Edition - Clinical stage from 02/11/2020: cTX, cN1, cM0 - Signed by Truitt Merle, MD on 02/14/2020   02/14/2020 Initial Diagnosis   Esophageal cancer ([place])   02/14/2020 Imaging   CT CAP w contrast  IMPRESSION: 1. Irregular wall thickening and an ill-defined mass involving the distal esophagus consistent with known esophageal cancer. 2. Mildly enlarged lymph nodes  lateral to the gastroesophageal junction and in the gastrohepatic ligament, suspicious for metastatic disease. 3. No evidence of distant metastatic disease. 4. 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 5. Aortic Atherosclerosis (ICD10-I70.0).   03/11/2020 -  Chemotherapy   The patient had palonosetron (ALOXI) injection 0.25 mg, 0.25 mg, Intravenous,  Once, 0 of 1 cycle CARBOplatin (PARAPLATIN) in sodium chloride 0.9 % 100 mL chemo infusion, , Intravenous,  Once, 0 of 1 cycle PACLitaxel (TAXOL) 84 mg in sodium chloride 0.9 % 250 mL chemo infusion (</= 80mg /m2), 50 mg/m2, Intravenous,  Once, 0 of 1 cycle  for chemotherapy treatment.      CURRENT THERAPY: PENDING neoadjuvant chemoRT with taxol and carboplatin, plan to start 03/11/20  INTERVAL HISTORY: Ms. Ellyson returns for f/u as scheduled. She was admitted for J tube placement from 02/20/20 - 02/27/20. She had n/v while advancing feeds but ultimately reached goal and was discharged home. She underwent PET scan as an outpatient. Today, she presents by herself. She is doing well. She was having bowel movements last week then diarrhea which stopped now no BM in 5 days. Mild nausea, no vomiting. She toes 1 bottle ensure TID to gravity which goes in very slowly. She does water flushes and meds via tube. Her belly is sore and swollen, rates dull ache 7/10. Takes norco 2 times daily but needs it 3 times. Otherwise, denies fever, chills, cough, chest pain, dyspnea    MEDICAL HISTORY:  Past Medical History:  Diagnosis Date  . Alcohol abuse   . Anemia   . Anxiety   . Anxiety and depression   . Asthma   . Depression   . Hepatitis A    "when I  was a kid"  . Hypertension     SURGICAL HISTORY: Past Surgical History:  Procedure Laterality Date  . BREAST LUMPECTOMY Right   . GASTROSTOMY N/A 02/22/2020   Procedure: OPEN PLACEMENT JEJUNOSTOMY FEEDING TUBE;  Surgeon: Jesusita Oka, MD;  Location: [facility];  Service:  General;  Laterality: N/A;  . TONSILLECTOMY AND ADENOIDECTOMY Bilateral over 30 years ago  . UPPER GASTROINTESTINAL ENDOSCOPY      I have reviewed the social history and family history with the patient and they are unchanged from previous note.  ALLERGIES:  is allergic to nitrofurantoin and sulfonamide derivatives.  MEDICATIONS:  Current Outpatient Medications  Medication Sig Dispense Refill  . acetaminophen (TYLENOL) 325 MG tablet Take 650 mg by mouth every 6 (six) hours as needed for mild pain or headache.    Marland Kitchen amLODipine (NORVASC) 10 MG tablet 1 tablet (10 mg total) by Per J Tube route daily. 30 tablet 0  . folic acid (FOLVITE) 1 MG tablet Place 1 tablet (1 mg total) into feeding tube daily. 30 tablet 1  . HYDROcodone-acetaminophen (HYCET) 7.5-325 mg/15 ml solution Take 5-10 mLs by mouth every 8 (eight) hours as needed for moderate pain or severe pain. Take 30 minutes before meal for severe pain 120 mL 0  . loratadine (CLARITIN) 10 MG tablet Take 1 tablet (10 mg total) by mouth daily. 30 tablet 0  . metoCLOPramide (REGLAN) 5 MG tablet Place 1 tablet (5 mg total) into feeding tube 3 (three) times daily. 30 tablet 1  . Nutritional Supplements (FEEDING SUPPLEMENT, OSMOLITE 1.5 CAL,) LIQD Place 1,000 mLs into feeding tube continuous.  0  . ondansetron (ZOFRAN ODT) 4 MG disintegrating tablet Take 1 tablet (4 mg total) by mouth every 8 (eight) hours as needed for nausea or vomiting. 20 tablet 0  . oxyCODONE (ROXICODONE) 5 MG/5ML solution Place 5 mLs (5 mg total) into feeding tube every 6 (six) hours as needed for up to 7 days for severe pain. 50 mL 0  . simethicone (MYLICON) 40 99991111 drops Place 0.6 mLs (40 mg total) into feeding tube 4 (four) times daily as needed (bloating). 30 mL 0  . thiamine 100 MG tablet 1 tablet (100 mg total) by Per J Tube route daily. 30 tablet 1  . ondansetron (ZOFRAN) 8 MG tablet Take 1 tablet (8 mg total) by mouth 2 (two) times daily as needed for refractory nausea  / vomiting. Start on day 3 after chemo. 30 tablet 1  . prochlorperazine (COMPAZINE) 10 MG tablet Take 1 tablet (10 mg total) by mouth every 6 (six) hours as needed (Nausea or vomiting). 30 tablet 1   Current Facility-Administered Medications  Medication Dose Route Frequency Provider Last Rate Last Admin  . 0.9 %  sodium chloride infusion  500 mL Intravenous Once Nandigam, Venia Minks, MD        PHYSICAL EXAMINATION: ECOG PERFORMANCE STATUS: 1 - Symptomatic but completely ambulatory  Vitals:   03/05/20 1228  BP: 108/76  Resp: 17  Temp: (!) 109 F (42.8 C)  SpO2: 98%   Filed Weights   03/05/20 1228  Weight: 138 lb 3.2 oz (62.7 kg)    GENERAL:alert, no distress and comfortable SKIN: no rash  EYES: sclera clear LUNGS: clear with normal breathing effort HEART: regular rate & rhythm, no lower extremity edema ABDOMEN: abdomen soft, mildly distended. Midline incision closed with dried blood on dressing. J tube in place with surrounding tenderness. No erythema or drainage. RLQ/RUQ bowel sounds present, decreased on left  NEURO: alert & oriented x 3 with fluent speech  LABORATORY DATA:  I have reviewed the data as listed CBC Latest Ref Rng & Units 03/05/2020 02/26/2020 02/25/2020  WBC 4.0 - 10.5 K/uL 14.8(H) 11.9(H) 12.4(H)  Hemoglobin 12.0 - 15.0 g/dL 11.9(L) 12.5 13.1  Hematocrit 36.0 - 46.0 % 35.6(L) 37.5 38.7  Platelets 150 - 400 K/uL 597(H) 295 279     CMP Latest Ref Rng & Units 03/05/2020 02/26/2020 02/25/2020  Glucose 70 - 99 mg/dL 116(H) 131(H) 138(H)  BUN 6 - 20 mg/dL 8 15 15   Creatinine 0.44 - 1.00 mg/dL 0.71 0.60 0.60  Sodium 135 - 145 mmol/L 140 143 142  Potassium 3.5 - 5.1 mmol/L 3.7 4.4 3.1(L)  Chloride 98 - 111 mmol/L 103 111 102  CO2 22 - 32 mmol/L 22 24 26   Calcium 8.9 - 10.3 mg/dL 9.4 8.8(L) 8.8(L)  Total Protein 6.5 - 8.1 g/dL 6.7 - 5.9(L)  Total Bilirubin 0.3 - 1.2 mg/dL 0.7 - 0.2(L)  Alkaline Phos 38 - 126 U/L 82 - 55  AST 15 - 41 U/L 22 - 20  ALT 0 - 44 U/L 21  - 22      RADIOGRAPHIC STUDIES: I have personally reviewed the radiological images as listed and agreed with the findings in the report. DG Abd 2 Views  Result Date: 03/05/2020 CLINICAL DATA:  Recent J-tube placement. EXAM: ABDOMEN - 2 VIEW COMPARISON:  CT 02/14/2020. FINDINGS: J surgical staples noted over the abdomen. Ejunostomy tube noted over the abdomen. Mild gastric distention. No prominent bowel distention or free air. Stool in the colon. Stable elevation left hemidiaphragm. Thoracolumbar spine scoliosis and degenerative change. Degenerative changes both hips. Sclerotic changes noted about both hips may be degenerative, avascular necrosis cannot be excluded. IMPRESSION: Jejunostomy tube noted over the abdomen. Mild gastric distention. No prominent bowel distention or free air. Electronically Signed   By: Marcello Moores  Register   On: 03/05/2020 14:07     ASSESSMENT & PLAN: SCHWANDA JARRATT a 49 y.o.Caucasianfemalewith a history of Alcohol abuse, anxiety/depression HTN   1. Esophagealsquamous cell carcinoma,in midesophagus, cTxN1M0 -EGD showed a partially obstructing tumor, biopsy confirmedinvasive squamous cells carcinoma. Imaging indicates mildly enlarged LNs lateral to the GEJ, she has locally advanced disease. There is no evidence of distant metastasis.  -I reviewed 03/01/20 PET scan independently and with the patient which shows known esophageal mass and hypermetabolic LNs in the paraesophageal, retrotracheal, and gastrohepatic areas, consistent with locally advanced disease. This was discussed with Shona Simpson, PA in [place].  -She previously consented to neoadjuvant chemotherapy with taxol and carboplatin concurrent with radiation starting 03/11/20. Goal of therapy is curative -She will consult with cardiothoracic surgery on 03/08/20    2. Dysphagia, Odynophagia, Weight loss, Malnutrition, Dehydration -She has had mild Dysphagia, Odynophagia for 1 year but has worsened in the last 2-3  months with mid upper right chest pain 8-9/10 with eating and less pain otherwise.  -She has lost 15 pound in the last 6 weeks  -Drinking Ensure and water, liquid Hycet, mostly liquid diet -She was hospitalized 5/18 - 5/25 for J tube placement, complicated by n/v  -she is tolerating Ensure TID via gravity. Weight is stable. Mild nausea. She had diarrhea last week now no BM in 5 days.  -Abdominal plain film is negative for obstruction; I recommend to begin miralax which has worked for her constipation in the past, mix with water via J tube 1-2 times daily  -f/u with nutrition today, I spoke to [place],  patient should be on osmolite via continuous pump. Will f/u.  3.Excessive alcohol usage and heavysmokinghistory,alcohol Cessation  -She has quit smoking 2 years ago and stopped using [place] 1 year ago. H/o alcohol use over many decades.  -she has been counseled to quit smoking and drinking alcohol completely   4. B12 Deficiency  -She has been on oral B12 but has not been able to swallow due to #1/#2 -B12 level 2136 on 5/13, no need for B12 inj currently, will monitor   -she has macrocytosis, B12 level is pending from today   5. Social and Acupuncturist  -She is married with no children. She has in-laws in town. Currently unemployed since late 2019, she was working in a bank. Her husband is a bar tender -She currently does not have insurance, but is looking to get coverage.  -F/u wtih financial advocate to help apply for grants and coverage  Disposition:  Ms. Sandine appears stable. She is tolerating J tube feeds TID, per Jennet Maduro, she should be on Osmolite via continuous pump. She will f/u with Dietician today. She was encouraged to increase water intake. Weight is stable. For constipation she previously used miralax, has not used any since J tube placement. Abdomen is mildly distended with decreased bowel sounds, no BM in 5 days. ABD film is negative for obstruction, shows stool  in the colon; I recommend to start miralax in water via J tube 1-2 times daily PRN.   I reviewed her PET scan and images with the patient today which shows biopsy proven hypermetabolic mid esophageal mass. The small hypermetabolic lymph nodes in the mediastinum, perigastric, and gastrohepatic areas are consistent with locoregional nodal metastasis. I reviewed with Shona Simpson, PA who spoke to Dr. Lisbeth Renshaw and feels he can target the whole area. For her locally advanced cancer, she will proceed with neoadjuvant concurrent chemoradiation with taxol and carboplatin as planned. Treatment goal is curative. We again reviewed anticipated side effects and symptom management. I have prescribed zofran and compazine. She will be scheduled for a chemo education session. She will meet with Dr. Kipp Brood cardiothoracic surgeon on 6/4. Her [facility] f/u for suture removal has been rescheduled to 6/2 at 9 am.  Labs reviewed, she has macrocytosis and mild anemia. She is taking folic acid but has not taken B12 lately. B12 level is pending. Iron studies also pending. CMP is stable, normal electrolytes. We are monitoring her Mag/phos due to concern for refeeding syndrome.   She will return for f/u and to begin cycle 1 day 1 chemoRT with taxol and carbo on 03/11/20.    No problem-specific Assessment & Plan notes found for this encounter.   Orders Placed This Encounter  Procedures  . DG Abd 2 Views    Standing Status:   Future    Number of Occurrences:   1    Standing Expiration Date:   03/05/2021    Order Specific Question:   Reason for Exam (SYMPTOM  OR DIAGNOSIS REQUIRED)    Answer:   recent J tube placement, no BM 5 days, decreased bowel sounds    Order Specific Question:   Is patient pregnant?    Answer:   No    Order Specific Question:   Preferred imaging location?    Answer:   [facility]    Order Specific Question:   Radiology Contrast Protocol - do NOT remove file path    Answer:    [email or web address]   All questions were answered. The patient  knows to call the clinic with any problems, questions or concerns. No barriers to learning was detected. Total encounter time ws 40 minutes.      Alla Feeling, NP 03/05/20

## 2020-03-05 ENCOUNTER — Ambulatory Visit: Payer: Self-pay

## 2020-03-05 ENCOUNTER — Encounter: Payer: Self-pay | Admitting: Nurse Practitioner

## 2020-03-05 ENCOUNTER — Inpatient Hospital Stay: Payer: Self-pay

## 2020-03-05 ENCOUNTER — Other Ambulatory Visit: Payer: Self-pay

## 2020-03-05 ENCOUNTER — Ambulatory Visit: Payer: Self-pay | Admitting: Radiation Oncology

## 2020-03-05 ENCOUNTER — Inpatient Hospital Stay: Payer: Self-pay | Attending: Hematology | Admitting: Nurse Practitioner

## 2020-03-05 ENCOUNTER — Ambulatory Visit (HOSPITAL_COMMUNITY)
Admission: RE | Admit: 2020-03-05 | Discharge: 2020-03-05 | Disposition: A | Discharge status: Home / Self Care | Payer: Self-pay | Source: Ambulatory Visit | Attending: Nurse Practitioner | Admitting: Nurse Practitioner

## 2020-03-05 VITALS — BP 108/76 | Temp 109.0°F | Resp 17 | Ht 65.0 in | Wt 138.2 lb

## 2020-03-05 DIAGNOSIS — Z5111 Encounter for antineoplastic chemotherapy: Secondary | ICD-10-CM | POA: Insufficient documentation

## 2020-03-05 DIAGNOSIS — D7589 Other specified diseases of blood and blood-forming organs: Secondary | ICD-10-CM | POA: Insufficient documentation

## 2020-03-05 DIAGNOSIS — F1021 Alcohol dependence, in remission: Secondary | ICD-10-CM | POA: Insufficient documentation

## 2020-03-05 DIAGNOSIS — R1310 Dysphagia, unspecified: Secondary | ICD-10-CM | POA: Insufficient documentation

## 2020-03-05 DIAGNOSIS — C155 Malignant neoplasm of lower third of esophagus: Secondary | ICD-10-CM | POA: Insufficient documentation

## 2020-03-05 DIAGNOSIS — Z87891 Personal history of nicotine dependence: Secondary | ICD-10-CM | POA: Insufficient documentation

## 2020-03-05 DIAGNOSIS — D649 Anemia, unspecified: Secondary | ICD-10-CM | POA: Insufficient documentation

## 2020-03-05 DIAGNOSIS — C154 Malignant neoplasm of middle third of esophagus: Secondary | ICD-10-CM | POA: Insufficient documentation

## 2020-03-05 DIAGNOSIS — Z931 Gastrostomy status: Secondary | ICD-10-CM | POA: Insufficient documentation

## 2020-03-05 DIAGNOSIS — Z560 Unemployment, unspecified: Secondary | ICD-10-CM | POA: Insufficient documentation

## 2020-03-05 DIAGNOSIS — K5900 Constipation, unspecified: Secondary | ICD-10-CM | POA: Insufficient documentation

## 2020-03-05 DIAGNOSIS — E538 Deficiency of other specified B group vitamins: Secondary | ICD-10-CM | POA: Insufficient documentation

## 2020-03-05 LAB — CBC WITH DIFFERENTIAL (CANCER CENTER ONLY)
Abs Immature Granulocytes: 0.36 10*3/uL — ABNORMAL HIGH (ref 0.00–0.07)
Basophils Absolute: 0.1 10*3/uL (ref 0.0–0.1)
Basophils Relative: 0 %
Eosinophils Absolute: 0.2 10*3/uL (ref 0.0–0.5)
Eosinophils Relative: 1 %
HCT: 35.6 % — ABNORMAL LOW (ref 36.0–46.0)
Hemoglobin: 11.9 g/dL — ABNORMAL LOW (ref 12.0–15.0)
Immature Granulocytes: 2 %
Lymphocytes Relative: 21 %
Lymphs Abs: 3.2 10*3/uL (ref 0.7–4.0)
MCH: 35.4 pg — ABNORMAL HIGH (ref 26.0–34.0)
MCHC: 33.4 g/dL (ref 30.0–36.0)
MCV: 106 fL — ABNORMAL HIGH (ref 80.0–100.0)
Monocytes Absolute: 2 10*3/uL — ABNORMAL HIGH (ref 0.1–1.0)
Monocytes Relative: 13 %
Neutro Abs: 9.1 10*3/uL — ABNORMAL HIGH (ref 1.7–7.7)
Neutrophils Relative %: 63 %
Platelet Count: 597 10*3/uL — ABNORMAL HIGH (ref 150–400)
RBC: 3.36 MIL/uL — ABNORMAL LOW (ref 3.87–5.11)
RDW: 12.1 % (ref 11.5–15.5)
WBC Count: 14.8 10*3/uL — ABNORMAL HIGH (ref 4.0–10.5)
nRBC: 0 % (ref 0.0–0.2)

## 2020-03-05 LAB — VITAMIN B12: Vitamin B-12: 2183 pg/mL — ABNORMAL HIGH (ref 180–914)

## 2020-03-05 LAB — CMP (CANCER CENTER ONLY)
ALT: 21 U/L (ref 0–44)
AST: 22 U/L (ref 15–41)
Albumin: 2.9 g/dL — ABNORMAL LOW (ref 3.5–5.0)
Alkaline Phosphatase: 82 U/L (ref 38–126)
Anion gap: 15 (ref 5–15)
BUN: 8 mg/dL (ref 6–20)
CO2: 22 mmol/L (ref 22–32)
Calcium: 9.4 mg/dL (ref 8.9–10.3)
Chloride: 103 mmol/L (ref 98–111)
Creatinine: 0.71 mg/dL (ref 0.44–1.00)
GFR, Est AFR Am: >60 mL/min (ref >60)
GFR, Estimated: >60 mL/min (ref >60)
Glucose, Bld: 116 mg/dL — ABNORMAL HIGH (ref 70–99)
Potassium: 3.7 mmol/L (ref 3.5–5.1)
Sodium: 140 mmol/L (ref 135–145)
Total Bilirubin: 0.7 mg/dL (ref 0.3–1.2)
Total Protein: 6.7 g/dL (ref 6.5–8.1)

## 2020-03-05 LAB — IRON AND TIBC
Iron: 52 ug/dL (ref 28–170)
Saturation Ratios: 19 % (ref 10.4–31.8)
TIBC: 277 ug/dL (ref 250–450)
UIBC: 225 ug/dL

## 2020-03-05 LAB — MAGNESIUM: Magnesium: 1.8 mg/dL (ref 1.7–2.4)

## 2020-03-05 LAB — FERRITIN: Ferritin: 255 ng/mL (ref 11–307)

## 2020-03-05 LAB — PHOSPHORUS: Phosphorus: 5 mg/dL — ABNORMAL HIGH (ref 2.5–4.6)

## 2020-03-05 MED ORDER — ONDANSETRON HCL 8 MG PO TABS: 8.0000 mg | ORAL_TABLET | Freq: Two times a day (BID) | ORAL | 1 refills | Status: DC | PRN

## 2020-03-05 MED ORDER — PROCHLORPERAZINE MALEATE 10 MG PO TABS: 10.0000 mg | ORAL_TABLET | Freq: Four times a day (QID) | ORAL | 1 refills | Status: DC | PRN

## 2020-03-05 NOTE — Progress Notes (Signed)
Pharmacist Chemotherapy Monitoring - Initial Assessment    Anticipated start date: 03/11/20  Regimen:  . Are orders appropriate based on the patient's diagnosis, regimen, and cycle? Yes . Does the plan date match the patient's scheduled date? Yes . Is the sequencing of drugs appropriate? Yes . Are the premedications appropriate for the patient's regimen? Yes . Prior Authorization for treatment is: Uninsured o If applicable, is the correct biosimilar selected based on the patient's insurance? not applicable  Organ Function and Labs: Marland Kitchen Are dose adjustments needed based on the patient's renal function, hepatic function, or hematologic function? Yes . Are appropriate labs ordered prior to the start of patient's treatment? Yes . Other organ system assessment, if indicated: N/A . The following baseline labs, if indicated, have been ordered: N/A; LMP 2 yrs ago  Dose Assessment: . Are the drug doses appropriate? Yes . Are the following correct: o Drug concentrations Yes o IV fluid compatible with drug Yes o Administration routes Yes o Timing of therapy Yes . If applicable, does the patient have documented access for treatment and/or plans for port-a-cath placement? no . If applicable, have lifetime cumulative doses been properly documented and assessed? not applicable  Toxicity Monitoring/Prevention: . The patient has the following take home antiemetics prescribed: Ondansetron . The patient has the following take home medications prescribed: N/A . Medication allergies and previous infusion related reactions, if applicable, have been reviewed and addressed. Yes . The patient's current medication list has been assessed for drug-drug interactions with their chemotherapy regimen. no significant drug-drug interactions were identified on review.  Order Review: . Are the treatment plan orders signed? No . Is the patient scheduled to see a provider prior to their treatment? Yes  I verify that I  have reviewed each item in the above checklist and answered each question accordingly.   Kennith Center, Pharm.D., CPP 03/05/2020@8 :48 AM

## 2020-03-05 NOTE — Progress Notes (Signed)
Patient missed her appointment for suture removal at [facility] this morning, I called and rescheduled for 6/2 at 9:00 to arrive at 8:30.  Called patient with this information.  She was given their address/suite #.  She verbalized an understanding.

## 2020-03-05 NOTE — Progress Notes (Addendum)
Nutrition Assessment   Reason for Assessment:  Patient with new J-tube  ASSESSMENT:  49 year old female with new esophageal cancer followed by Dr. Burr Medico.  Past medical history of alcohol abuse.  Noted hospital admission for J-tube placement.    RD hospital notes reviewed.   Patient was seen by Regan Rakers, NP and abdominal xray was performed today prior to meeting with patient.  Patient with no bowel movement for 5 days.    Met with patient in clinic today.  Patient reports that she has been giving 3 cartons of ensure via J-tube. Further questioning patient reports it looks like ensure.  Patient texted husband and he sent back a picture of bottle of osmolite 1.5 and ensure. Questioned patient on if she was giving via pump and told RD that it hangs in a bag.  Husband was able to text back a picture of a Joey pump.  Patient said that husband has been helping with tube feeding and she does not really know as she has been overwhelmed with everything. Says that she turns pump on at 10am and stops it about 10pm at night (6m/hr using about 3 cartons) Does not sound as if she is giving additional water through feeding tube.  Says that she is not eating or drinking anything by mouth.  Has not had a bowel movement in 5 days.  Denies nausea or vomiting.    Patient shifting in wheelchair and ready to leave cancer center.  "I have been her for about 3 hours."  Medications: reglan, folic acid, zofran ODT, thiamine   Labs: Phosphorus 5.0, Mag 1.8, K 3.7   Anthropometrics:   Height: 65 inches Weight: 138 lb 3.2 oz BMI: 23  Per inpatient RD note lost 12 lb since 3/23 (7% weight loss in 1.5 months, significant)  Estimated Energy Needs  Kcals: 1900-2100 Protein: 95-110 g Fluid: 2 L/d   NUTRITION DIAGNOSIS: Inadequate oral intake related to esophageal cancer as evidenced by J-tube placement and unable to take po.    INTERVENTION:  Recommend patient stay on the pump for 20 hours at slight increase in  rate to 769mhr from 6051mr ( 6 cartons of osmolite 1.5) to better meet nutritional needs.  RD can work on increasing rate of tube feeding and reducing time on the pump once tolerating at goal rate.  Patient with tolerance issues inpatient.  Clarified with patient to use osmolite 1.5 via J-tube not ensure.   Recommend water flush of 120 ml when starts tube feeding (10am) and when stops tube feeding (6am).  Will need additional 120m50mter flushes at noon, 2pm, 4pm, 6pm, 8pm, 10pm.  RD hesitant about giving larger bolus of water until able to tolerate smaller amount.  Water flushes can be used with medication administration.  Reviewed this process with patient.  Provides 2160 calories, 89 g protein, 2040ml69me water (formula and water flush) Called [employer] and patient was delivered osmolite 1.5, Joey pump on 5/25. [place] asked that staff member reach out to patient and husband on proper way to adjust rate of pump to 72ml/54mor 20 hours.   Received secure chat message from [place] was negative, showed stool in colon.  RD was asked to relay message to patient to start miralax mixed in water 1-2 times daily via J-tube.  Written down for patient and verbalized understanding.   Handwritten tube feeding regimen with water flush was given to patient as well.   Contact  information given to patient.      MONITORING, EVALUATION, GOAL: weight trends, TF tolerance   Next Visit: Monday, June 7 during infusion  Jeremian Whitby B. Zenia Resides, [place], [place] Registered Dietitian [phone] (pager)

## 2020-03-06 ENCOUNTER — Telehealth: Payer: Self-pay | Admitting: Nurse Practitioner

## 2020-03-06 ENCOUNTER — Ambulatory Visit: Payer: Self-pay

## 2020-03-06 NOTE — Telephone Encounter (Signed)
Scheduled appt per 6/1 los.  Spoke with pt and they are aware of their appt date and time.

## 2020-03-07 ENCOUNTER — Ambulatory Visit: Payer: Self-pay

## 2020-03-07 NOTE — Progress Notes (Signed)
[facility]   Telephone:([phone] Fax:([phone]   Clinic Follow up Note   Patient Care Team: Eulas Post, MD as PCP - General Jonnie Finner, RN as Oncology Nurse Navigator Truitt Merle, MD as Consulting Physician (Hematology) Michael Boston, MD as Consulting Physician (General Surgery) Kyung Rudd, MD as Consulting Physician (Radiation Oncology) Mauri Pole, MD as Consulting Physician (Gastroenterology) Jackelyn Knife, MD as Rounding Team (Internal Medicine)  Date of Service:  03/11/2020  CHIEF COMPLAINT: F/u of esophageal cancer   SUMMARY OF ONCOLOGIC HISTORY: Oncology History Overview Note  Cancer Staging Esophageal cancer [facility]) Staging form: Esophagus - Other Histologies, AJCC 8th Edition - Clinical stage from 02/11/2020: cTX, cN1, cM0 - Signed by Truitt Merle, MD on 02/14/2020    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 Biopsied. - Food in the middle third of the esophagus. Removal was successful.   02/08/2020 Initial Biopsy   Diagnosis 02/08/20 Esophagus, biopsy, mass - INVASIVE SQUAMOUS CELL CARCINOMA - SEE COMMENT Microscopic Comment Based on the biopsy, the carcinoma appears moderately differentiated. Dr. Silverio Decamp was paged on Feb 09, 2020. Dr. Jeannie Done reviewed the case and agrees with the above diagnosis.    02/11/2020 Cancer Staging   Staging form: Esophagus - Other Histologies, AJCC 8th Edition - Clinical stage from 02/11/2020: cTX, cN1, cM0 - Signed by Truitt Merle, MD on 02/14/2020   02/14/2020 Initial Diagnosis   Esophageal cancer ([place])   02/14/2020 Imaging   CT CAP w contrast  IMPRESSION: 1. Irregular wall thickening and an ill-defined mass involving the distal esophagus consistent with known esophageal cancer. 2. Mildly  enlarged lymph nodes lateral to the gastroesophageal junction and in the gastrohepatic ligament, suspicious for metastatic disease. 3. No evidence of distant metastatic disease. 4. 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 5. Aortic Atherosclerosis (ICD10-I70.0).   03/18/2020 -  Chemotherapy   Concurrent chemoRT with weekly carboplatin and Taxol starting 03/18/20   03/18/2020 -  Radiation Therapy   Concurrent chemo RT with weekly CT by Dr Lisbeth Renshaw starting 03/18/20      CURRENT THERAPY:   concurrent chemoRT with weekly carboplatin and Taxol starting 03/18/20  INTERVAL HISTORY:  Natalie Morrison is here for a follow up. She presents to the clinic alone. She notes her tube feeding is going well. She has been feeding with pump during the day and night 60% over 20 hours a day. She flushes with saline fluid. She is able to gain weight and energy. She also has been taking Ensure and nothing by mouth except crushed pills. She denies diarrhea, but had no BM for 2 days and then restarted. She notes she feels aches of her throat currently.    REVIEW OF SYSTEMS:   Constitutional: Denies fevers, chills or abnormal weight loss Eyes: Denies blurriness of vision Ears, nose, mouth, throat, and face: Denies mucositis or sore throat Respiratory: Denies cough, dyspnea or wheezes Cardiovascular: Denies palpitation, chest discomfort or lower extremity swelling Gastrointestinal:  Denies nausea, heartburn or change in bowel habits Skin: Denies abnormal skin rashes Lymphatics: Denies new lymphadenopathy or easy bruising Neurological:Denies numbness, tingling or new weaknesses Behavioral/Psych: Mood is stable, no new changes  All other systems were reviewed with the patient and are negative.  MEDICAL HISTORY:  Past Medical History:  Diagnosis Date  .  Alcohol abuse   . Anemia   . Anxiety   . Anxiety and depression   . Asthma   . Depression   . Hepatitis A    "when I was a  kid"  . Hypertension     SURGICAL HISTORY: Past Surgical History:  Procedure Laterality Date  . BREAST LUMPECTOMY Right   . GASTROSTOMY N/A 02/22/2020   Procedure: OPEN PLACEMENT JEJUNOSTOMY FEEDING TUBE;  Surgeon: Jesusita Oka, MD;  Location: [place];  Service: General;  Laterality: N/A;  . TONSILLECTOMY AND ADENOIDECTOMY Bilateral over 30 years ago  . UPPER GASTROINTESTINAL ENDOSCOPY      I have reviewed the social history and family history with the patient and they are unchanged from previous note.  ALLERGIES:  is allergic to nitrofurantoin and sulfonamide derivatives.  MEDICATIONS:  Current Outpatient Medications  Medication Sig Dispense Refill  . acetaminophen (TYLENOL) 325 MG tablet Take 650 mg by mouth every 6 (six) hours as needed for mild pain or headache.    Marland Kitchen amLODipine (NORVASC) 10 MG tablet 1 tablet (10 mg total) by Per J Tube route daily. 30 tablet 0  . folic acid (FOLVITE) 1 MG tablet Place 1 tablet (1 mg total) into feeding tube daily. 30 tablet 1  . HYDROcodone-acetaminophen (HYCET) 7.5-325 mg/15 ml solution Take 5-10 mLs by mouth every 8 (eight) hours as needed for moderate pain or severe pain. Take 30 minutes before meal for severe pain 120 mL 0  . loratadine (CLARITIN) 10 MG tablet Take 1 tablet (10 mg total) by mouth daily. 30 tablet 0  . metoCLOPramide (REGLAN) 5 MG tablet Place 1 tablet (5 mg total) into feeding tube 3 (three) times daily. 30 tablet 1  . Nutritional Supplements (FEEDING SUPPLEMENT, OSMOLITE 1.5 CAL,) LIQD Place 1,000 mLs into feeding tube continuous.  0  . ondansetron (ZOFRAN ODT) 4 MG disintegrating tablet Take 1 tablet (4 mg total) by mouth every 8 (eight) hours as needed for nausea or vomiting. 20 tablet 0  . ondansetron (ZOFRAN) 8 MG tablet Take 1 tablet (8 mg total) by mouth 2 (two) times daily as needed for refractory nausea / vomiting. Start on day 3 after chemo. 30 tablet 1  . prochlorperazine (COMPAZINE) 10 MG tablet Take 1 tablet (10  mg total) by mouth every 6 (six) hours as needed (Nausea or vomiting). 30 tablet 1  . simethicone (MYLICON) 40 [id number] drops Place 0.6 mLs (40 mg total) into feeding tube 4 (four) times daily as needed (bloating). 30 mL 0  . thiamine 100 MG tablet 1 tablet (100 mg total) by Per J Tube route daily. 30 tablet 1   Current Facility-Administered Medications  Medication Dose Route Frequency Provider Last Rate Last Admin  . 0.9 %  sodium chloride infusion  500 mL Intravenous Once Nandigam, Kavitha V, MD        PHYSICAL EXAMINATION: ECOG PERFORMANCE STATUS: 2 - Symptomatic, <50% confined to bed  Vitals:   03/11/20 0826  BP: 120/88  Pulse: 98  Resp: 18  Temp: 97.9 F (36.6 C)  SpO2: 100%   Filed Weights   03/11/20 0826  Weight: 141 lb 11.2 oz (64.3 kg)    Due to COVID19 we will limit examination to appearance. Patient had no complaints.  GENERAL:alert, no distress and comfortable SKIN: skin color normal, no rashes or significant lesions EYES: normal, Conjunctiva are pink and non-injected, sclera clear  NEURO: alert & oriented x 3 with fluent speech   LABORATORY DATA:  I have  reviewed the data as listed CBC Latest Ref Rng & Units 03/11/2020 03/05/2020 02/26/2020  WBC 4.0 - 10.5 K/uL 13.7(H) 14.8(H) 11.9(H)  Hemoglobin 12.0 - 15.0 g/dL 10.9(L) 11.9(L) 12.5  Hematocrit 36.0 - 46.0 % 32.8(L) 35.6(L) 37.5  Platelets 150 - 400 K/uL 449(H) 597(H) 295     CMP Latest Ref Rng & Units 03/11/2020 03/05/2020 02/26/2020  Glucose 70 - 99 mg/dL 111(H) 116(H) 131(H)  BUN 6 - 20 mg/dL 11 8 15   Creatinine 0.44 - 1.00 mg/dL 0.69 0.71 0.60  Sodium 135 - 145 mmol/L 141 140 143  Potassium 3.5 - 5.1 mmol/L 4.0 3.7 4.4  Chloride 98 - 111 mmol/L 105 103 111  CO2 22 - 32 mmol/L 21(L) 22 24  Calcium 8.9 - 10.3 mg/dL 9.6 9.4 8.8(L)  Total Protein 6.5 - 8.1 g/dL 6.8 6.7 -  Total Bilirubin 0.3 - 1.2 mg/dL 0.7 0.7 -  Alkaline Phos 38 - 126 U/L 74 82 -  AST 15 - 41 U/L 20 22 -  ALT 0 - 44 U/L 15 21 -       RADIOGRAPHIC STUDIES: I have personally reviewed the radiological images as listed and agreed with the findings in the report. No results found.   ASSESSMENT & PLAN:  KALIMAH BRANNICK is a 48 y.o. female with    1. Esophageal squamous cell carcinoma, in mid esophagus, cTxN1M0 -She was diagnosed in 02/2020. EGD showeda partially obstructing tumor, biopsy confirmedinvasive squamous cells carcinoma. Imaging indicates mildly enlarged LNs lateral to the GEJ, she has locally advanced disease. There is no evidence of distant metastasis. -Her 03/01/20 PET scan showed known esophageal mass and hypermetabolic LNs in the paraesophageal, retrotracheal, and gastrohepatic areas, consistent with locally advanced disease. -I previously discussed although her cancer appears localized it is locally advanced with obstruction and reginal node involvement. Given the positive nodes on CT, I do not think she needs EUS.  -I previously discussed for locally advanced disease standard treatment includes neoadjuvant concurrent chemoradiation followed by esophagectomy surgery which is curative, but it is overall aggressive cancer, and has high risk of recurrence. -I planned to start on her concurrent chemoRT with weekly carboplatin and Taxol today but her RT is not scheduled to start until 03/18/20, will postpone start of chemo to next week then. I reviewed side effects to watch for including reaction to Taxol. I encouraged her to have someone drive with her to first chemo infusion.  -Labs reviewed, WBC 13.7, Hg 10.9, MCV 104.8, plt 449K. Iron panel and CMP still pending.  -F/u in 2 weeks with week 2 treatment.    2. Dysphagia, Odynophagia, Weight loss, Malnutrition, Dehydration -She has had mild Dysphagia, Odynophagia for 1 year but has worsened in the last 2-3 months with mid upper right chest pain 8-9/10 with eating and less pain otherwise.  -She has lost 15 pound over 6 weeks and reduced eating to jello,  pudding, milkshakes. She takes liquid tylenol for pain.  -She had J-Tube placement as inpatient on 02/22/20. She will continue to f/u with dietician for management.  -She has been feeding with pump during the day and night 60% over 20 hours a day along with Ensure TID via gravity. She is not taking anything by mouth except crushed pills. She has been able to gain weight.  -She had 2 days of no BM, which has resolved and otherwise no diarrhea. I recommend she use stool softener if needed to have regular BM.    3. Excessive alcohol usage  and heavy smoking history, alcohol Cessation  -She has quit smoking 2 years ago and stopped using [place] 1 year ago. She rarely drinks alcohol now and stopped since her pain, but was drinking for 50 years.  -I previously discussed her history of long term smoking and alcohol use is related to this type of esophageal cancer. I previously advised her to quit drinking alcohol completely. She agreed.     4. B12 Deficiency  -She has been on oral B12 but has not been able to swallow due to #1/#2 -B12 level 2136 on 5/13, no need for B12 injections currently, will monitor -She has macrocytosis, B12 from 03/05/20 was 2183.    5. Social and Acupuncturist  -She is married with no children. She has in-laws in town -She is currently unemployed since late 2019, she was working in a bank. Her husband is a bar tender -She currently does not have insurance, but is looking to get coverage.  -F/u withfinancial advocate to help apply for grants and coverage   PLAN:  -Labs reviewed today  -Lab, Flush, Chemo CT in 1, 2, 3 weeks -Start RT on 6/14  -F/u in one week with initiation of chemoRT    No problem-specific Assessment & Plan notes found for this encounter.   No orders of the defined types were placed in this encounter.  All questions were answered. The patient knows to call the clinic with any problems, questions or concerns. No barriers to learning  was detected. The total time spent in the appointment was 20 minutes.     Truitt Merle, MD 03/11/2020   I, Natalie Morrison, am acting as scribe for Truitt Merle, MD.   I have reviewed the above documentation for accuracy and completeness, and I agree with the above.

## 2020-03-08 ENCOUNTER — Encounter: Payer: Self-pay | Admitting: Thoracic Surgery (Cardiothoracic Vascular Surgery)

## 2020-03-08 ENCOUNTER — Ambulatory Visit: Payer: Self-pay

## 2020-03-08 ENCOUNTER — Inpatient Hospital Stay: Payer: Self-pay

## 2020-03-11 ENCOUNTER — Encounter: Payer: Self-pay | Admitting: Hematology

## 2020-03-11 ENCOUNTER — Ambulatory Visit: Payer: Self-pay

## 2020-03-11 ENCOUNTER — Inpatient Hospital Stay: Payer: Self-pay

## 2020-03-11 ENCOUNTER — Inpatient Hospital Stay: Payer: Self-pay | Admitting: Nutrition

## 2020-03-11 ENCOUNTER — Inpatient Hospital Stay (HOSPITAL_BASED_OUTPATIENT_CLINIC_OR_DEPARTMENT_OTHER): Payer: Self-pay | Admitting: Hematology

## 2020-03-11 ENCOUNTER — Telehealth: Payer: Self-pay | Admitting: Nutrition

## 2020-03-11 ENCOUNTER — Other Ambulatory Visit: Payer: Self-pay

## 2020-03-11 VITALS — BP 120/88 | HR 98 | Temp 97.9°F | Resp 18 | Ht 65.0 in | Wt 141.7 lb

## 2020-03-11 DIAGNOSIS — C154 Malignant neoplasm of middle third of esophagus: Secondary | ICD-10-CM

## 2020-03-11 DIAGNOSIS — C155 Malignant neoplasm of lower third of esophagus: Secondary | ICD-10-CM

## 2020-03-11 LAB — CBC WITH DIFFERENTIAL (CANCER CENTER ONLY)
Abs Immature Granulocytes: 0.11 10*3/uL — ABNORMAL HIGH (ref 0.00–0.07)
Basophils Absolute: 0.1 10*3/uL (ref 0.0–0.1)
Basophils Relative: 0 %
Eosinophils Absolute: 0.2 10*3/uL (ref 0.0–0.5)
Eosinophils Relative: 1 %
HCT: 32.8 % — ABNORMAL LOW (ref 36.0–46.0)
Hemoglobin: 10.9 g/dL — ABNORMAL LOW (ref 12.0–15.0)
Immature Granulocytes: 1 %
Lymphocytes Relative: 28 %
Lymphs Abs: 3.8 10*3/uL (ref 0.7–4.0)
MCH: 34.8 pg — ABNORMAL HIGH (ref 26.0–34.0)
MCHC: 33.2 g/dL (ref 30.0–36.0)
MCV: 104.8 fL — ABNORMAL HIGH (ref 80.0–100.0)
Monocytes Absolute: 1.5 10*3/uL — ABNORMAL HIGH (ref 0.1–1.0)
Monocytes Relative: 11 %
Neutro Abs: 8.1 10*3/uL — ABNORMAL HIGH (ref 1.7–7.7)
Neutrophils Relative %: 59 %
Platelet Count: 449 10*3/uL — ABNORMAL HIGH (ref 150–400)
RBC: 3.13 MIL/uL — ABNORMAL LOW (ref 3.87–5.11)
RDW: 12.1 % (ref 11.5–15.5)
WBC Count: 13.7 10*3/uL — ABNORMAL HIGH (ref 4.0–10.5)
nRBC: 0 % (ref 0.0–0.2)

## 2020-03-11 LAB — CMP (CANCER CENTER ONLY)
ALT: 15 U/L (ref 0–44)
AST: 20 U/L (ref 15–41)
Albumin: 2.9 g/dL — ABNORMAL LOW (ref 3.5–5.0)
Alkaline Phosphatase: 74 U/L (ref 38–126)
Anion gap: 15 (ref 5–15)
BUN: 11 mg/dL (ref 6–20)
CO2: 21 mmol/L — ABNORMAL LOW (ref 22–32)
Calcium: 9.6 mg/dL (ref 8.9–10.3)
Chloride: 105 mmol/L (ref 98–111)
Creatinine: 0.69 mg/dL (ref 0.44–1.00)
GFR, Est AFR Am: >60 mL/min (ref >60)
GFR, Estimated: >60 mL/min (ref >60)
Glucose, Bld: 111 mg/dL — ABNORMAL HIGH (ref 70–99)
Potassium: 4 mmol/L (ref 3.5–5.1)
Sodium: 141 mmol/L (ref 135–145)
Total Bilirubin: 0.7 mg/dL (ref 0.3–1.2)
Total Protein: 6.8 g/dL (ref 6.5–8.1)

## 2020-03-11 LAB — IRON AND TIBC
Iron: 61 ug/dL (ref 41–142)
Saturation Ratios: 24 % (ref 21–57)
TIBC: 250 ug/dL (ref 236–444)
UIBC: 189 ug/dL (ref 120–384)

## 2020-03-11 LAB — FERRITIN: Ferritin: 278 ng/mL (ref 11–307)

## 2020-03-11 LAB — VITAMIN B12: Vitamin B-12: 971 pg/mL — ABNORMAL HIGH (ref 180–914)

## 2020-03-11 NOTE — Telephone Encounter (Signed)
Patient's treatment was not given today and noted she will begin concurrent chemoradiation therapy next week.  I contacted patient by telephone however she was not available.  I left my name and phone number and asked for return call.  **Disclaimer: This note was dictated with voice recognition software. Similar sounding words can inadvertently be transcribed and this note may contain transcription errors which may not have been corrected upon publication of note.**

## 2020-03-12 ENCOUNTER — Ambulatory Visit: Payer: Self-pay | Attending: Internal Medicine | Admitting: Internal Medicine

## 2020-03-12 ENCOUNTER — Encounter: Payer: Self-pay | Admitting: Internal Medicine

## 2020-03-12 ENCOUNTER — Ambulatory Visit: Payer: Self-pay

## 2020-03-12 DIAGNOSIS — I10 Essential (primary) hypertension: Secondary | ICD-10-CM

## 2020-03-12 DIAGNOSIS — C155 Malignant neoplasm of lower third of esophagus: Secondary | ICD-10-CM

## 2020-03-12 DIAGNOSIS — R110 Nausea: Secondary | ICD-10-CM

## 2020-03-12 NOTE — Progress Notes (Signed)
Virtual Visit via Telephone Note  I connected with Natalie Morrison on 03/12/20 at  1:30 PM EDT by telephone and verified that I am speaking with the correct person using two identifiers.  Location: Patient: home  Provider: car, [place]   I discussed the limitations, risks, security and privacy concerns of performing an evaluation and management service by telephone and the availability of in person appointments. I also discussed with the patient that there may be a patient responsible charge related to this service. The patient expressed understanding and agreed to proceed.   History of Present Illness: Hospital f/u, esophageal ca, seen by Onc. Due to start chemo next week. Reports some discomfort at J-tube site, controlled with tylenol. +nausea, not taking meds for nausea, no vomiting recently. BM's normal. Has regained 2 lbs since starting ensure. Taking vitamins as directed.   Current Outpatient Medications  Medication Instructions  . acetaminophen (TYLENOL) 650 mg, Oral, Every 6 hours PRN  . amLODipine (NORVASC) 10 mg, Per J Tube, Daily  . folic acid (FOLVITE) 1 mg, Per Tube, Daily  . HYDROcodone-acetaminophen (HYCET) 7.5-325 mg/15 ml solution 5-10 mLs, Oral, Every 8 hours PRN, Take 30 minutes before meal for severe pain  . loratadine (CLARITIN) 10 mg, Oral, Daily  . metoCLOPramide (REGLAN) 5 mg, Per Tube, 3 times daily  . Nutritional Supplements (FEEDING SUPPLEMENT, OSMOLITE 1.5 CAL,) LIQD 1,000 mLs, Per Tube, Continuous  . ondansetron (ZOFRAN ODT) 4 mg, Oral, Every 8 hours PRN  . ondansetron (ZOFRAN) 8 mg, Oral, 2 times daily PRN, Start on day 3 after chemo.  . prochlorperazine (COMPAZINE) 10 mg, Oral, Every 6 hours PRN  . simethicone (MYLICON) 40 mg, Per Tube, 4 times daily PRN  . thiamine 100 mg, Per J Tube, Daily    Assessment and Plan:  1. 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  HTN (hypertension), benign Continue amlodipine  3. Nausea Advised to start zofran bid prn     Follow Up Instructions:    I discussed the assessment and treatment plan with the patient. The patient was provided an opportunity to ask questions and all were answered. The patient agreed with the plan and demonstrated an understanding of the instructions.   The patient was advised to call back or seek an in-person evaluation if the symptoms worsen or if the condition fails to improve as anticipated.  I provided 25 minutes of non-face-to-face time during this encounter.   Kimber Relic, MD

## 2020-03-13 ENCOUNTER — Ambulatory Visit: Payer: Self-pay

## 2020-03-13 DIAGNOSIS — C154 Malignant neoplasm of middle third of esophagus: Secondary | ICD-10-CM | POA: Insufficient documentation

## 2020-03-14 ENCOUNTER — Ambulatory Visit: Payer: Self-pay

## 2020-03-14 NOTE — Progress Notes (Signed)
[place]   [phone] [phone]   Clinic Follow up Note   Patient Care Team: Eulas Post, MD as PCP - General Jonnie Finner, RN as Oncology Nurse Navigator Truitt Merle, MD as Consulting Physician (Hematology) Michael Boston, MD as Consulting Physician (General Surgery) Kyung Rudd, MD as Consulting Physician (Radiation Oncology) Mauri Pole, MD as Consulting Physician (Gastroenterology) Jackelyn Knife, MD as Rounding Team (Internal Medicine) 03/18/2020  CHIEF COMPLAINT: F/o esophagus cancer   SUMMARY OF ONCOLOGIC HISTORY: Oncology History Overview Note  Cancer Staging Esophageal cancer [facility]) Staging form: Esophagus - Other Histologies, AJCC 8th Edition - Clinical stage from 02/11/2020: cTX, cN1, cM0 - Signed by Truitt Merle, MD on 02/14/2020    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 Biopsied. - Food in the middle third of the esophagus. Removal was successful.   02/08/2020 Initial Biopsy   Diagnosis 02/08/20 Esophagus, biopsy, mass - INVASIVE SQUAMOUS CELL CARCINOMA - SEE COMMENT Microscopic Comment Based on the biopsy, the carcinoma appears moderately differentiated. Dr. Silverio Decamp was paged on Feb 09, 2020. Dr. Jeannie Done reviewed the case and agrees with the above diagnosis.    02/11/2020 Cancer Staging   Staging form: Esophagus - Other Histologies, AJCC 8th Edition - Clinical stage from 02/11/2020: cTX, cN1, cM0 - Signed by Truitt Merle, MD on 02/14/2020   02/14/2020 Initial Diagnosis   Esophageal cancer ([place])   02/14/2020 Imaging   CT CAP w contrast  IMPRESSION: 1. Irregular wall thickening and an ill-defined mass involving the distal esophagus consistent with known esophageal cancer. 2. Mildly enlarged lymph nodes  lateral to the gastroesophageal junction and in the gastrohepatic ligament, suspicious for metastatic disease. 3. No evidence of distant metastatic disease. 4. 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 5. Aortic Atherosclerosis (ICD10-I70.0).   03/18/2020 -  Chemotherapy   Concurrent chemoRT with weekly carboplatin and Taxol starting 03/18/20   03/18/2020 -  Radiation Therapy   Concurrent chemo RT with weekly CT by Dr Lisbeth Renshaw starting 03/18/20     CURRENT THERAPY: concurrent chemoRT with weekly carboplatin and Taxol starting 03/18/20  INTERVAL HISTORY: Natalie Morrison returns for f/u and treatment as scheduled. She met with nutrition last week who recommended 20 hours continuous pump feeds at 72 ml/hr osmolite 1.5. she has been at goal for about a week now. She feels full occasionally especially after water flush. Her baseline pain is unchanged. Usually managed with tylenol. Pain currently is 6/10, can get up to 8/10 when she wakes up. Tylenol brings it to 3/10. Mild nausea is managed with zofran PRN. She is constipated, no BM since last week. She has not taken any meds for this. She is passing gas. She is resting about half the day. Denies fever, chills, cough, chest pain, dyspnea, or new issues.    MEDICAL HISTORY:  Past Medical History:  Diagnosis Date  . Alcohol abuse   . Anemia   . Anxiety   . Anxiety and depression   . Asthma   . Depression   . Hepatitis A    "when I was a kid"  . Hypertension     SURGICAL HISTORY: Past Surgical History:  Procedure Laterality Date  . BREAST LUMPECTOMY Right   . GASTROSTOMY N/A 02/22/2020   Procedure: OPEN PLACEMENT JEJUNOSTOMY FEEDING TUBE;  Surgeon:  Jesusita Oka, MD;  Location: [facility] OR;  Service: General;  Laterality: N/A;  . TONSILLECTOMY AND ADENOIDECTOMY Bilateral over 30 years ago  . UPPER GASTROINTESTINAL ENDOSCOPY      I have reviewed the social history and family history with the patient and they are  unchanged from previous note.  ALLERGIES:  is allergic to nitrofurantoin and sulfonamide derivatives.  MEDICATIONS:  Current Outpatient Medications  Medication Sig Dispense Refill  . acetaminophen (TYLENOL) 325 MG tablet Take 650 mg by mouth every 6 (six) hours as needed for mild pain or headache.    Marland Kitchen amLODipine (NORVASC) 10 MG tablet 1 tablet (10 mg total) by Per J Tube route daily. 30 tablet 0  . folic acid (FOLVITE) 1 MG tablet Place 1 tablet (1 mg total) into feeding tube daily. 30 tablet 1  . HYDROcodone-acetaminophen (HYCET) 7.5-325 mg/15 ml solution Take 5-10 mLs by mouth every 8 (eight) hours as needed for moderate pain or severe pain. Take 30 minutes before meal for severe pain 120 mL 0  . loratadine (CLARITIN) 10 MG tablet Take 1 tablet (10 mg total) by mouth daily. 30 tablet 0  . metoCLOPramide (REGLAN) 5 MG tablet Place 1 tablet (5 mg total) into feeding tube 3 (three) times daily. 30 tablet 1  . Nutritional Supplements (FEEDING SUPPLEMENT, OSMOLITE 1.5 CAL,) LIQD Place 1,000 mLs into feeding tube continuous.  0  . ondansetron (ZOFRAN ODT) 4 MG disintegrating tablet Take 1 tablet (4 mg total) by mouth every 8 (eight) hours as needed for nausea or vomiting. 20 tablet 0  . ondansetron (ZOFRAN) 8 MG tablet Take 1 tablet (8 mg total) by mouth 2 (two) times daily as needed for refractory nausea / vomiting. Start on day 3 after chemo. 30 tablet 1  . prochlorperazine (COMPAZINE) 10 MG tablet Take 1 tablet (10 mg total) by mouth every 6 (six) hours as needed (Nausea or vomiting). 30 tablet 1  . simethicone (MYLICON) 40 SH/7.50YO drops Place 0.6 mLs (40 mg total) into feeding tube 4 (four) times daily as needed (bloating). 30 mL 0  . thiamine 100 MG tablet 1 tablet (100 mg total) by Per J Tube route daily. 30 tablet 1  . Cyanocobalamin 1000 MCG/15ML LIQD Take 15 mLs (1,000 mcg total) by mouth daily. 450 mL 1   Current Facility-Administered Medications  Medication Dose Route Frequency  Provider Last Rate Last Admin  . 0.9 %  sodium chloride infusion  500 mL Intravenous Once Nandigam, Venia Minks, MD       Facility-Administered Medications Ordered in Other Visits  Medication Dose Route Frequency Provider Last Rate Last Admin  . CARBOplatin (PARAPLATIN) 220 mg in sodium chloride 0.9 % 100 mL chemo infusion  220 mg Intravenous Once Truitt Merle, MD        PHYSICAL EXAMINATION: ECOG PERFORMANCE STATUS: 2 - Symptomatic, <50% confined to bed  Vitals:   03/18/20 0908  BP: 115/89  Pulse: 94  Resp: 18  Temp: 97.7 F (36.5 C)  SpO2: 98%   Filed Weights   03/18/20 0908  Weight: 138 lb (62.6 kg)    GENERAL:alert, no distress and comfortable SKIN: no rash  EYES: sclera clear LUNGS: clear with normal breathing effort HEART: regular rate & rhythm. Trace pedal edema  ABDOMEN:abdomen soft, non-tender and normal bowel sounds. Midline incision healed with granulation tissue. J tube site covered dressing c/d/i  NEURO: alert & oriented x 3 with fluent speech, normal gait  LABORATORY DATA:  I have reviewed the data  as listed CBC Latest Ref Rng & Units 03/18/2020 03/11/2020 03/05/2020  WBC 4.0 - 10.5 K/uL 12.4(H) 13.7(H) 14.8(H)  Hemoglobin 12.0 - 15.0 g/dL 11.9(L) 10.9(L) 11.9(L)  Hematocrit 36 - 46 % 35.0(L) 32.8(L) 35.6(L)  Platelets 150 - 400 K/uL 392 449(H) 597(H)     CMP Latest Ref Rng & Units 03/18/2020 03/11/2020 03/05/2020  Glucose 70 - 99 mg/dL 128(H) 111(H) 116(H)  BUN 6 - 20 mg/dL _0 Creatinine 0.44 - 1.00 mg/dL 0.67 0.69 0.71  Sodium 135 - 145 mmol/L 141 141 140  Potassium 3.5 - 5.1 mmol/L 3.6 4.0 3.7  Chloride 98 - 111 mmol/L 106 105 103  CO2 22 - 32 mmol/L 22 21(L) 22  Calcium 8.9 - 10.3 mg/dL 9.5 9.6 9.4  Total Protein 6.5 - 8.1 g/dL 6.7 6.8 6.7  Total Bilirubin 0.3 - 1.2 mg/dL 0.5 0.7 0.7  Alkaline Phos 38 - 126 U/L 75 74 82  AST 15 - 41 U/L _1 ALT 0 - 44 U/L _2 RADIOGRAPHIC STUDIES: I have personally reviewed the radiological images  as listed and agreed with the findings in the report. No results found.   ASSESSMENT & PLAN: KAINA ORENGO a 49 y.o.Caucasianfemalewith a history of Alcohol abuse, anxiety/depression HTN   1. Esophagealsquamous cell carcinoma,in midesophagus, cTxN1M0 -EGD showeda partially obstructing tumor, biopsy confirmedinvasive squamous cells carcinoma. Imaging indicates mildly enlarged LNs lateral to the GEJ, she has locally advanced disease. There is no evidence of distant metastasis. -I reviewed 03/01/20 PET scan independently and with the patient which shows known esophageal mass and hypermetabolic LNs in the paraesophageal, retrotracheal, and gastrohepatic areas, consistent with locally advanced disease. This was discussed with Natalie Simpson, PA in RT.  -She previously consented to neoadjuvant chemotherapy with taxol and carboplatin concurrent with radiation starting 03/18/20. Goal of therapy is curative -She was referred to cardiothoracic surgery  2. Dysphagia, Odynophagia, Weight loss, Malnutrition, Dehydration -She has had mild Dysphagia, Odynophagia for 1 year but has worsened in the last 2-3 months with mid upper right chest pain 8-9/10 with eating and less pain otherwise.  -She has lost 15 pound in the last 6 weeks -Drinking Ensure and water, liquid Hycet, mostly liquid diet -She was hospitalized 5/18 - 5/25 for J tube placement, complicated by n/v  -followed by nutrition   3.Excessive alcohol usage and heavysmokinghistory,alcohol Cessation  -She has quit smoking 2 years ago and stopped using [place] 1 year ago.H/o alcohol use over many decades.  -she has been counseled to quit smoking and drinking alcohol completely  4. B12 Deficiency  -She has been on oral B12 but has not been able to swallow due to #1/#2 -B12 level 2136 on 5/13, no need for B12 inj currently, will monitor -she has macrocytosis, B12 level is 514 today which is normal but level has dropped  from 2183 two weeks ago. I recommend oral liquid B12   5. Social and Acupuncturist  -She is married with no children. She has in-laws in town. Currently unemployed since late 2019, she was working in a bank. Her husband is a bar tender -She currently does not have insurance, but is looking to get coverage.  -F/u wtihfinancial advocate to help apply for grants and coverage   Disposition:  Natalie Morrison appears unchanged. She is tolerating osmolite tube feedings at goal rate 72 ml/hr for 18-20 hours per day. Pain is stable. We reviewed symptom management for nausea and constipation.  I recommend to continue zofran and begin miralax with water BID until able to maintain BM q1-2 days. She will start tonight. Continue f/u with dietician, weight fluctuates but stable overall lately.   CBC and CMP are stable. Iron studies are normal. B12 is normal at 594 but dropping over the last 2 weeks. I recommend to begin oral liquid B12. Labs overall adequate to proceed with day 1 taxol/carboplatin. She will begin RT today after infusion. She will return for weekly lab and f/u with chemo.   Refilled hycet and simethicone. New Rx: cyanocobalamin liquid   All questions were answered. The patient knows to call the clinic with any problems, questions or concerns. No barriers to learning were detected.    Natalie Feeling, NP 03/18/20

## 2020-03-15 ENCOUNTER — Ambulatory Visit: Payer: Self-pay

## 2020-03-18 ENCOUNTER — Ambulatory Visit
Admission: RE | Admit: 2020-03-18 | Discharge: 2020-03-18 | Disposition: A | Discharge status: Home / Self Care | Payer: Self-pay | Source: Ambulatory Visit | Attending: Radiation Oncology | Admitting: Radiation Oncology

## 2020-03-18 ENCOUNTER — Inpatient Hospital Stay: Payer: Self-pay

## 2020-03-18 ENCOUNTER — Encounter: Payer: Self-pay | Admitting: Nurse Practitioner

## 2020-03-18 ENCOUNTER — Inpatient Hospital Stay: Payer: Self-pay | Admitting: Nutrition

## 2020-03-18 ENCOUNTER — Other Ambulatory Visit: Payer: Self-pay

## 2020-03-18 ENCOUNTER — Ambulatory Visit: Payer: Self-pay

## 2020-03-18 ENCOUNTER — Inpatient Hospital Stay (HOSPITAL_BASED_OUTPATIENT_CLINIC_OR_DEPARTMENT_OTHER): Payer: Self-pay | Admitting: Nurse Practitioner

## 2020-03-18 ENCOUNTER — Encounter: Payer: Self-pay | Admitting: Hematology

## 2020-03-18 VITALS — BP 115/89 | HR 94 | Temp 97.7°F | Resp 18 | Ht 65.0 in | Wt 138.0 lb

## 2020-03-18 VITALS — BP 124/71 | HR 81 | Temp 98.3°F | Resp 18

## 2020-03-18 DIAGNOSIS — C155 Malignant neoplasm of lower third of esophagus: Secondary | ICD-10-CM

## 2020-03-18 DIAGNOSIS — C154 Malignant neoplasm of middle third of esophagus: Secondary | ICD-10-CM

## 2020-03-18 LAB — CMP (CANCER CENTER ONLY)
ALT: 15 U/L (ref 0–44)
AST: 18 U/L (ref 15–41)
Albumin: 3.2 g/dL — ABNORMAL LOW (ref 3.5–5.0)
Alkaline Phosphatase: 75 U/L (ref 38–126)
Anion gap: 13 (ref 5–15)
BUN: 18 mg/dL (ref 6–20)
CO2: 22 mmol/L (ref 22–32)
Calcium: 9.5 mg/dL (ref 8.9–10.3)
Chloride: 106 mmol/L (ref 98–111)
Creatinine: 0.67 mg/dL (ref 0.44–1.00)
GFR, Est AFR Am: >60 mL/min (ref >60)
GFR, Estimated: >60 mL/min (ref >60)
Glucose, Bld: 128 mg/dL — ABNORMAL HIGH (ref 70–99)
Potassium: 3.6 mmol/L (ref 3.5–5.1)
Sodium: 141 mmol/L (ref 135–145)
Total Bilirubin: 0.5 mg/dL (ref 0.3–1.2)
Total Protein: 6.7 g/dL (ref 6.5–8.1)

## 2020-03-18 LAB — VITAMIN B12: Vitamin B-12: 594 pg/mL (ref 180–914)

## 2020-03-18 LAB — IRON AND TIBC
Iron: 61 ug/dL (ref 41–142)
Saturation Ratios: 22 % (ref 21–57)
TIBC: 283 ug/dL (ref 236–444)
UIBC: 221 ug/dL (ref 120–384)

## 2020-03-18 LAB — FERRITIN: Ferritin: 194 ng/mL (ref 11–307)

## 2020-03-18 LAB — CBC WITH DIFFERENTIAL (CANCER CENTER ONLY)
Abs Immature Granulocytes: 0.09 10*3/uL — ABNORMAL HIGH (ref 0.00–0.07)
Basophils Absolute: 0 10*3/uL (ref 0.0–0.1)
Basophils Relative: 0 %
Eosinophils Absolute: 0.3 10*3/uL (ref 0.0–0.5)
Eosinophils Relative: 2 %
HCT: 35 % — ABNORMAL LOW (ref 36.0–46.0)
Hemoglobin: 11.9 g/dL — ABNORMAL LOW (ref 12.0–15.0)
Immature Granulocytes: 1 %
Lymphocytes Relative: 29 %
Lymphs Abs: 3.7 10*3/uL (ref 0.7–4.0)
MCH: 35.2 pg — ABNORMAL HIGH (ref 26.0–34.0)
MCHC: 34 g/dL (ref 30.0–36.0)
MCV: 103.6 fL — ABNORMAL HIGH (ref 80.0–100.0)
Monocytes Absolute: 1.7 10*3/uL — ABNORMAL HIGH (ref 0.1–1.0)
Monocytes Relative: 13 %
Neutro Abs: 6.7 10*3/uL (ref 1.7–7.7)
Neutrophils Relative %: 55 %
Platelet Count: 392 10*3/uL (ref 150–400)
RBC: 3.38 MIL/uL — ABNORMAL LOW (ref 3.87–5.11)
RDW: 12.2 % (ref 11.5–15.5)
WBC Count: 12.4 10*3/uL — ABNORMAL HIGH (ref 4.0–10.5)
nRBC: 0 % (ref 0.0–0.2)

## 2020-03-18 MED ORDER — HYDROCODONE-ACETAMINOPHEN 7.5-325 MG/15ML PO SOLN: 5.0000 mL | Freq: Three times a day (TID) | ORAL | 0 refills | Status: DC | PRN

## 2020-03-18 MED ORDER — PALONOSETRON HCL INJECTION 0.25 MG/5ML
0.2500 mg | Freq: Once | INTRAVENOUS | Status: AC
  Administered 2020-03-18: 0.25 mg via INTRAVENOUS

## 2020-03-18 MED ORDER — SODIUM CHLORIDE 0.9 % IV SOLN
218.2000 mg | Freq: Once | INTRAVENOUS | Status: AC
  Administered 2020-03-18: 220 mg via INTRAVENOUS
  Filled 2020-03-18: qty 22

## 2020-03-18 MED ORDER — FAMOTIDINE IN NACL 20-0.9 MG/50ML-% IV SOLN
INTRAVENOUS | Status: AC
  Filled 2020-03-18: qty 50

## 2020-03-18 MED ORDER — SODIUM CHLORIDE 0.9 % IV SOLN
Freq: Once | INTRAVENOUS | Status: AC
  Filled 2020-03-18: qty 250

## 2020-03-18 MED ORDER — FAMOTIDINE IN NACL 20-0.9 MG/50ML-% IV SOLN
20.0000 mg | Freq: Once | INTRAVENOUS | Status: AC
  Administered 2020-03-18: 20 mg via INTRAVENOUS

## 2020-03-18 MED ORDER — DIPHENHYDRAMINE HCL 50 MG/ML IJ SOLN
50.0000 mg | Freq: Once | INTRAMUSCULAR | Status: AC
  Administered 2020-03-18: 50 mg via INTRAVENOUS

## 2020-03-18 MED ORDER — SODIUM CHLORIDE 0.9 % IV SOLN
20.0000 mg | Freq: Once | INTRAVENOUS | Status: AC
  Administered 2020-03-18: 20 mg via INTRAVENOUS
  Filled 2020-03-18: qty 20

## 2020-03-18 MED ORDER — DIPHENHYDRAMINE HCL 50 MG/ML IJ SOLN
INTRAMUSCULAR | Status: AC
  Filled 2020-03-18: qty 1

## 2020-03-18 MED ORDER — CYANOCOBALAMIN 1000 MCG/15ML PO LIQD: 1000.0000 ug | Freq: Every day | ORAL | 1 refills | Status: AC

## 2020-03-18 MED ORDER — SODIUM CHLORIDE 0.9 % IV SOLN
50.0000 mg/m2 | Freq: Once | INTRAVENOUS | Status: AC
  Administered 2020-03-18: 84 mg via INTRAVENOUS
  Filled 2020-03-18: qty 14

## 2020-03-18 MED ORDER — PALONOSETRON HCL INJECTION 0.25 MG/5ML
INTRAVENOUS | Status: AC
  Filled 2020-03-18: qty 5

## 2020-03-18 MED ORDER — SIMETHICONE 40 MG/0.6ML PO SUSP: 40.0000 mg | Freq: Four times a day (QID) | ORAL | 0 refills | Status: DC | PRN

## 2020-03-18 MED FILL — HYDROCOD-APAP 7.5-325/15ML: 7.5-325 | 4 days supply | Qty: 120 | Fill #0

## 2020-03-18 NOTE — Progress Notes (Signed)
Met with patient at registration to introduce myself as Financial Resource Specialist and to offer available resources.  Discussed one-time $1000 [employer] grant and qualifications to assist with personal expenses while going through treatment.  Gave her my card if interested in applying and for any additional financial questions or concerns.   

## 2020-03-18 NOTE — Patient Instructions (Signed)
[facility] Discharge Instructions for Patients Receiving Chemotherapy  Today you received the following chemotherapy agents: paclitaxel and carboplatin.  To help prevent nausea and vomiting after your treatment, we encourage you to take your nausea medication as directed.  If you develop nausea and vomiting that is not controlled by your nausea medication, call the clinic.   BELOW ARE SYMPTOMS THAT SHOULD BE REPORTED IMMEDIATELY:  *FEVER GREATER THAN 100.5 F  *CHILLS WITH OR WITHOUT FEVER  NAUSEA AND VOMITING THAT IS NOT CONTROLLED WITH YOUR NAUSEA MEDICATION  *UNUSUAL SHORTNESS OF BREATH  *UNUSUAL BRUISING OR BLEEDING  TENDERNESS IN MOUTH AND THROAT WITH OR WITHOUT PRESENCE OF ULCERS  *URINARY PROBLEMS  *BOWEL PROBLEMS  UNUSUAL RASH Items with * indicate a potential emergency and should be followed up as soon as possible.  Feel free to call the clinic should you have any questions or concerns. The clinic phone number is [phone].  Please show the CHEMO ALERT CARD at check-in to the Emergency Department and triage nurse.  Paclitaxel injection What is this medicine? PACLITAXEL (PAK li TAX el) is a chemotherapy drug. It targets fast dividing cells, like cancer cells, and causes these cells to die. This medicine is used to treat ovarian cancer, breast cancer, lung cancer, Kaposi's sarcoma, and other cancers. This medicine may be used for other purposes; ask your health care provider or pharmacist if you have questions. COMMON BRAND NAME(S): Onxol, Taxol What should I tell my health care provider before I take this medicine? They need to know if you have any of these conditions:  history of irregular heartbeat  liver disease  low blood counts, like low white cell, platelet, or red cell counts  lung or breathing disease, like asthma  tingling of the fingers or toes, or other nerve disorder  an unusual or allergic reaction to paclitaxel, alcohol,  polyoxyethylated castor oil, other chemotherapy, other medicines, foods, dyes, or preservatives  pregnant or trying to get pregnant  breast-feeding How should I use this medicine? This drug is given as an infusion into a vein. It is administered in a hospital or clinic by a specially trained health care professional. Talk to your pediatrician regarding the use of this medicine in children. Special care may be needed. Overdosage: If you think you have taken too much of this medicine contact a poison control center or emergency room at once. NOTE: This medicine is only for you. Do not share this medicine with others. What if I miss a dose? It is important not to miss your dose. Call your doctor or health care professional if you are unable to keep an appointment. What may interact with this medicine? Do not take this medicine with any of the following medications:  disulfiram  metronidazole This medicine may also interact with the following medications:  antiviral medicines for hepatitis, HIV or AIDS  certain antibiotics like erythromycin and clarithromycin  certain medicines for fungal infections like ketoconazole and itraconazole  certain medicines for seizures like carbamazepine, phenobarbital, phenytoin  gemfibrozil  nefazodone  rifampin  St. John's wort This list may not describe all possible interactions. Give your health care provider a list of all the medicines, herbs, non-prescription drugs, or dietary supplements you use. Also tell them if you smoke, drink alcohol, or use illegal drugs. Some items may interact with your medicine. What should I watch for while using this medicine? Your condition will be monitored carefully while you are receiving this medicine. You will need important blood   work done while you are taking this medicine. This medicine can cause serious allergic reactions. To reduce your risk you will need to take other medicine(s) before treatment with this  medicine. If you experience allergic reactions like skin rash, itching or hives, swelling of the face, lips, or tongue, tell your doctor or health care professional right away. In some cases, you may be given additional medicines to help with side effects. Follow all directions for their use. This drug may make you feel generally unwell. This is not uncommon, as chemotherapy can affect healthy cells as well as cancer cells. Report any side effects. Continue your course of treatment even though you feel ill unless your doctor tells you to stop. Call your doctor or health care professional for advice if you get a fever, chills or sore throat, or other symptoms of a cold or flu. Do not treat yourself. This drug decreases your body's ability to fight infections. Try to avoid being around people who are sick. This medicine may increase your risk to bruise or bleed. Call your doctor or health care professional if you notice any unusual bleeding. Be careful brushing and flossing your teeth or using a toothpick because you may get an infection or bleed more easily. If you have any dental work done, tell your dentist you are receiving this medicine. Avoid taking products that contain aspirin, acetaminophen, ibuprofen, naproxen, or ketoprofen unless instructed by your doctor. These medicines may hide a fever. Do not become pregnant while taking this medicine. Women should inform their doctor if they wish to become pregnant or think they might be pregnant. There is a potential for serious side effects to an unborn child. Talk to your health care professional or pharmacist for more information. Do not breast-feed an infant while taking this medicine. Men are advised not to father a child while receiving this medicine. This product may contain alcohol. Ask your pharmacist or healthcare provider if this medicine contains alcohol. Be sure to tell all healthcare providers you are taking this medicine. Certain medicines,  like metronidazole and disulfiram, can cause an unpleasant reaction when taken with alcohol. The reaction includes flushing, headache, nausea, vomiting, sweating, and increased thirst. The reaction can last from 30 minutes to several hours. What side effects may I notice from receiving this medicine? Side effects that you should report to your doctor or health care professional as soon as possible:  allergic reactions like skin rash, itching or hives, swelling of the face, lips, or tongue  breathing problems  changes in vision  fast, irregular heartbeat  high or low blood pressure  mouth sores  pain, tingling, numbness in the hands or feet  signs of decreased platelets or bleeding - bruising, pinpoint red spots on the skin, black, tarry stools, blood in the urine  signs of decreased red blood cells - unusually weak or tired, feeling faint or lightheaded, falls  signs of infection - fever or chills, cough, sore throat, pain or difficulty passing urine  signs and symptoms of liver injury like dark yellow or brown urine; general ill feeling or flu-like symptoms; light-colored stools; loss of appetite; nausea; right upper belly pain; unusually weak or tired; yellowing of the eyes or skin  swelling of the ankles, feet, hands  unusually slow heartbeat Side effects that usually do not require medical attention (report to your doctor or health care professional if they continue or are bothersome):  diarrhea  hair loss  loss of appetite  muscle or joint pain    nausea, vomiting  pain, redness, or irritation at site where injected  tiredness This list may not describe all possible side effects. Call your doctor for medical advice about side effects. You may report side effects to FDA at 1-800-FDA-1088. Where should I keep my medicine? This drug is given in a hospital or clinic and will not be stored at home. NOTE: This sheet is a summary. It may not cover all possible information.  If you have questions about this medicine, talk to your doctor, pharmacist, or health care provider.  2020 Elsevier/Gold Standard (2017-05-25 13:14:55)  Carboplatin injection What is this medicine? CARBOPLATIN (KAR boe pla tin) is a chemotherapy drug. It targets fast dividing cells, like cancer cells, and causes these cells to die. This medicine is used to treat ovarian cancer and many other cancers. This medicine may be used for other purposes; ask your health care provider or pharmacist if you have questions. COMMON BRAND NAME(S): Paraplatin What should I tell my health care provider before I take this medicine? They need to know if you have any of these conditions:  blood disorders  hearing problems  kidney disease  recent or ongoing radiation therapy  an unusual or allergic reaction to carboplatin, cisplatin, other chemotherapy, other medicines, foods, dyes, or preservatives  pregnant or trying to get pregnant  breast-feeding How should I use this medicine? This drug is usually given as an infusion into a vein. It is administered in a hospital or clinic by a specially trained health care professional. Talk to your pediatrician regarding the use of this medicine in children. Special care may be needed. Overdosage: If you think you have taken too much of this medicine contact a poison control center or emergency room at once. NOTE: This medicine is only for you. Do not share this medicine with others. What if I miss a dose? It is important not to miss a dose. Call your doctor or health care professional if you are unable to keep an appointment. What may interact with this medicine?  medicines for seizures  medicines to increase blood counts like filgrastim, pegfilgrastim, sargramostim  some antibiotics like amikacin, gentamicin, neomycin, streptomycin, tobramycin  vaccines Talk to your doctor or health care professional before taking any of these  medicines:  acetaminophen  aspirin  ibuprofen  ketoprofen  naproxen This list may not describe all possible interactions. Give your health care provider a list of all the medicines, herbs, non-prescription drugs, or dietary supplements you use. Also tell them if you smoke, drink alcohol, or use illegal drugs. Some items may interact with your medicine. What should I watch for while using this medicine? Your condition will be monitored carefully while you are receiving this medicine. You will need important blood work done while you are taking this medicine. This drug may make you feel generally unwell. This is not uncommon, as chemotherapy can affect healthy cells as well as cancer cells. Report any side effects. Continue your course of treatment even though you feel ill unless your doctor tells you to stop. In some cases, you may be given additional medicines to help with side effects. Follow all directions for their use. Call your doctor or health care professional for advice if you get a fever, chills or sore throat, or other symptoms of a cold or flu. Do not treat yourself. This drug decreases your body's ability to fight infections. Try to avoid being around people who are sick. This medicine may increase your risk to bruise or bleed.   Call your doctor or health care professional if you notice any unusual bleeding. Be careful brushing and flossing your teeth or using a toothpick because you may get an infection or bleed more easily. If you have any dental work done, tell your dentist you are receiving this medicine. Avoid taking products that contain aspirin, acetaminophen, ibuprofen, naproxen, or ketoprofen unless instructed by your doctor. These medicines may hide a fever. Do not become pregnant while taking this medicine. Women should inform their doctor if they wish to become pregnant or think they might be pregnant. There is a potential for serious side effects to an unborn child. Talk  to your health care professional or pharmacist for more information. Do not breast-feed an infant while taking this medicine. What side effects may I notice from receiving this medicine? Side effects that you should report to your doctor or health care professional as soon as possible:  allergic reactions like skin rash, itching or hives, swelling of the face, lips, or tongue  signs of infection - fever or chills, cough, sore throat, pain or difficulty passing urine  signs of decreased platelets or bleeding - bruising, pinpoint red spots on the skin, black, tarry stools, nosebleeds  signs of decreased red blood cells - unusually weak or tired, fainting spells, lightheadedness  breathing problems  changes in hearing  changes in vision  chest pain  high blood pressure  low blood counts - This drug may decrease the number of white blood cells, red blood cells and platelets. You may be at increased risk for infections and bleeding.  nausea and vomiting  pain, swelling, redness or irritation at the injection site  pain, tingling, numbness in the hands or feet  problems with balance, talking, walking  trouble passing urine or change in the amount of urine Side effects that usually do not require medical attention (report to your doctor or health care professional if they continue or are bothersome):  hair loss  loss of appetite  metallic taste in the mouth or changes in taste This list may not describe all possible side effects. Call your doctor for medical advice about side effects. You may report side effects to FDA at 1-800-FDA-1088. Where should I keep my medicine? This drug is given in a hospital or clinic and will not be stored at home. NOTE: This sheet is a summary. It may not cover all possible information. If you have questions about this medicine, talk to your doctor, pharmacist, or health care provider.  2020 Elsevier/Gold Standard (2007-12-27 14:38:05)     

## 2020-03-18 NOTE — Progress Notes (Signed)
Nutrition follow-up completed with patient during infusion for esophageal cancer.  Patient is receiving concurrent chemoradiation therapy.  She has a J-tube placed and has been using Osmolite 1.5 at 72 mL an hour over 20 hours some days.  She is only flushing feeding tube with approximately 30 mL of free water before and after bolus feeding. She reports adding water to the feeding tube bag and running this by continuous pump but she cannot tell me how much water. She denies eating or drinking by mouth. Labs were reviewed. Noted weight 138 pounds on June 14 which is stable overall. Patient reports report nausea and constipation.  Estimated nutrition needs: 1900-2100 cal, 95--110 g protein, 2.0 L fluid. 6 cartons Osmolite 1.5 provides 2160 cal and 89 g protein.  If patient administers free water as advised she will total 2040 mL daily  Nutrition diagnosis: Inadequate oral intake continues.  Intervention: Encourage patient to increase J-tube feedings to 82 mL/h in order to get a full 6 cartons of nutrition.  This should take a little over 17 hours. Patient will flush feeding tube with 120 mL of free water before and after bolus feedings.  In addition she was advised to give 120 mL free water 6 remaining times during the day approximately every 2 hours. Stressed importance of taking medications as prescribed for both nausea and constipation. Educated patient to call [facility] and order additional tube feeding supplies and formula because she is almost out. Patient able to repeat instructions.  Monitoring, evaluation, goals: Patient will tolerate tube feedings and free water flushes at goal to meet greater than 90% estimated nutrition needs.  Next visit: Monday, June 21 during infusion.  **Disclaimer: This note was dictated with voice recognition software. Similar sounding words can inadvertently be transcribed and this note may contain transcription errors which may not have been corrected  upon publication of note.**

## 2020-03-19 ENCOUNTER — Ambulatory Visit
Admission: RE | Admit: 2020-03-19 | Discharge: 2020-03-19 | Disposition: A | Discharge status: Home / Self Care | Payer: Self-pay | Source: Ambulatory Visit | Attending: Radiation Oncology | Admitting: Radiation Oncology

## 2020-03-19 ENCOUNTER — Other Ambulatory Visit: Payer: Self-pay

## 2020-03-19 ENCOUNTER — Ambulatory Visit: Payer: Self-pay

## 2020-03-19 ENCOUNTER — Telehealth: Payer: Self-pay | Admitting: Nurse Practitioner

## 2020-03-19 ENCOUNTER — Telehealth: Payer: Self-pay

## 2020-03-19 NOTE — Telephone Encounter (Signed)
I spoke with Natalie Morrison this afternoon regarding her first chemo treatment.  She had no issues yesterday and is not having any side effects today.  I encouraged her to call if she has any questions or concerns.  She verbalized understanding.

## 2020-03-19 NOTE — Telephone Encounter (Signed)
Scheduled appt per 6/14 los.  Pt will get an updated appt calendar at their next scheduled appt. 

## 2020-03-20 ENCOUNTER — Ambulatory Visit
Admission: RE | Admit: 2020-03-20 | Discharge: 2020-03-20 | Disposition: A | Discharge status: Home / Self Care | Payer: Self-pay | Source: Ambulatory Visit | Attending: Radiation Oncology | Admitting: Radiation Oncology

## 2020-03-20 ENCOUNTER — Ambulatory Visit: Payer: Self-pay

## 2020-03-20 ENCOUNTER — Other Ambulatory Visit: Payer: Self-pay

## 2020-03-20 MED FILL — METOCLOPRAMIDE 5 MG TABLET: 5 | 10 days supply | Qty: 30 | Fill #0

## 2020-03-20 NOTE — Progress Notes (Signed)
[place]   [phone] [phone]   Clinic Follow up Note   Patient Care Team: Eulas Post, MD as PCP - General Jonnie Finner, RN as Oncology Nurse Navigator Truitt Merle, MD as Consulting Physician (Hematology) Michael Boston, MD as Consulting Physician (General Surgery) Kyung Rudd, MD as Consulting Physician (Radiation Oncology) Mauri Pole, MD as Consulting Physician (Gastroenterology) Jackelyn Knife, MD as Rounding Team (Internal Medicine)  Date of Service:  04/01/2020  CHIEF COMPLAINT: F/u of esophageal cancer   SUMMARY OF ONCOLOGIC HISTORY: Oncology History Overview Note  Cancer Staging Esophageal cancer [facility]) Staging form: Esophagus - Other Histologies, AJCC 8th Edition - Clinical stage from 02/11/2020: cTX, cN1, cM0 - Signed by Truitt Merle, MD on 02/14/2020    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 Biopsied. - Food in the middle third of the esophagus. Removal was successful.   02/08/2020 Initial Biopsy   Diagnosis 02/08/20 Esophagus, biopsy, mass - INVASIVE SQUAMOUS CELL CARCINOMA - SEE COMMENT Microscopic Comment Based on the biopsy, the carcinoma appears moderately differentiated. Dr. Silverio Decamp was paged on Feb 09, 2020. Dr. Jeannie Done reviewed the case and agrees with the above diagnosis.    02/11/2020 Cancer Staging   Staging form: Esophagus - Other Histologies, AJCC 8th Edition - Clinical stage from 02/11/2020: cTX, cN1, cM0 - Signed by Truitt Merle, MD on 02/14/2020   02/14/2020 Initial Diagnosis   Esophageal cancer ([place])   02/14/2020 Imaging   CT CAP w contrast  IMPRESSION: 1. Irregular wall thickening and an ill-defined mass involving the distal esophagus consistent with known esophageal cancer. 2. Mildly  enlarged lymph nodes lateral to the gastroesophageal junction and in the gastrohepatic ligament, suspicious for metastatic disease. 3. No evidence of distant metastatic disease. 4. 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 5. Aortic Atherosclerosis (ICD10-I70.0).   03/18/2020 -  Chemotherapy   Concurrent chemoRT with weekly carboplatin and Taxol starting 03/18/20   03/18/2020 -  Radiation Therapy   Concurrent chemo RT with weekly CT by Dr Lisbeth Renshaw starting 03/18/20      CURRENT THERAPY:  Concurrent chemoRT with weekly carboplatin and Taxol starting 03/18/20  INTERVAL HISTORY:  Natalie Morrison is here for a follow up and treatment. She presents to the clinic alone. She notes her chemoRT was tolerable. She notes pain in her upper abdomen 3/10. She takes Tylenol as needed, about 3 times a day which helps. She notes she is on hydrocodone 1-2 times a day, given by Dr Lisbeth Renshaw. She denies fever, chills, SOB, or neuropathy. Her BM are adequate. She has more fatigue with less than 50% of energy. She is sitting down often and will take naps as needed. She is taking 25ml tube feeding over 18 hours a day. She notes she has increased thirst recently. She notes she is not currently on her BP medication. Her BP is 119/92 today.     REVIEW OF SYSTEMS:   Constitutional: Denies fevers, chills or abnormal weight loss (+) Fatigue  Eyes: Denies blurriness of vision Ears, nose, mouth, throat, and face: Denies mucositis or sore throat Respiratory: Denies cough, dyspnea or wheezes Cardiovascular: Denies palpitation, chest discomfort or lower extremity swelling Gastrointestinal:  Denies nausea, heartburn or change in bowel habits (+) upper abdominal pain  Skin: Denies abnormal skin rashes Lymphatics: Denies  new lymphadenopathy or easy bruising Neurological:Denies numbness, tingling or new weaknesses Behavioral/Psych: Mood is stable, no new changes  All other systems were reviewed with  the patient and are negative.  MEDICAL HISTORY:  Past Medical History:  Diagnosis Date  . Alcohol abuse   . Anemia   . Anxiety   . Anxiety and depression   . Asthma   . Depression   . Hepatitis A    "when I was a kid"  . Hypertension     SURGICAL HISTORY: Past Surgical History:  Procedure Laterality Date  . BREAST LUMPECTOMY Right   . GASTROSTOMY N/A 02/22/2020   Procedure: OPEN PLACEMENT JEJUNOSTOMY FEEDING TUBE;  Surgeon: Jesusita Oka, MD;  Location: [place];  Service: General;  Laterality: N/A;  . TONSILLECTOMY AND ADENOIDECTOMY Bilateral over 30 years ago  . UPPER GASTROINTESTINAL ENDOSCOPY      I have reviewed the social history and family history with the patient and they are unchanged from previous note.  ALLERGIES:  is allergic to nitrofurantoin and sulfonamide derivatives.  MEDICATIONS:  Current Outpatient Medications  Medication Sig Dispense Refill  . acetaminophen (TYLENOL) 325 MG tablet Take 650 mg by mouth every 6 (six) hours as needed for mild pain or headache.    Marland Kitchen amLODipine (NORVASC) 10 MG tablet 1 tablet (10 mg total) by Per J Tube route daily. 30 tablet 0  . Cyanocobalamin 1000 MCG/15ML LIQD Take 15 mLs (1,000 mcg total) by mouth daily. 720 mL 1  . folic acid (FOLVITE) 1 MG tablet Place 1 tablet (1 mg total) into feeding tube daily. 30 tablet 1  . HYDROcodone-acetaminophen (HYCET) 7.5-325 mg/15 ml solution Take 5-10 mLs by mouth every 8 (eight) hours as needed for moderate pain or severe pain. Take 30 minutes before meal for severe pain 240 mL 0  . loratadine (CLARITIN) 10 MG tablet Take 1 tablet (10 mg total) by mouth daily. 30 tablet 2  . metoCLOPramide (REGLAN) 5 MG tablet Place 1 tablet (5 mg total) into feeding tube 3 (three) times daily. 30 tablet 1  . Nutritional Supplements (FEEDING SUPPLEMENT, OSMOLITE 1.5 CAL,) LIQD Place 1,000 mLs into feeding tube continuous.  0  . ondansetron (ZOFRAN ODT) 4 MG disintegrating tablet Take 1 tablet (4 mg total)  by mouth every 8 (eight) hours as needed for nausea or vomiting. 20 tablet 0  . ondansetron (ZOFRAN) 8 MG tablet Take 1 tablet (8 mg total) by mouth 2 (two) times daily as needed for refractory nausea / vomiting. Start on day 3 after chemo. 30 tablet 1  . prochlorperazine (COMPAZINE) 10 MG tablet Take 1 tablet (10 mg total) by mouth every 6 (six) hours as needed (Nausea or vomiting). 30 tablet 1  . simethicone (MYLICON) 40 NO/7.0JG drops Place 0.6 mLs (40 mg total) into feeding tube 4 (four) times daily as needed (bloating). 30 mL 0  . thiamine 100 MG tablet 1 tablet (100 mg total) by Per J Tube route daily. 30 tablet 1   Current Facility-Administered Medications  Medication Dose Route Frequency Provider Last Rate Last Admin  . 0.9 %  sodium chloride infusion  500 mL Intravenous Once Nandigam, Kavitha V, MD        PHYSICAL EXAMINATION: ECOG PERFORMANCE STATUS: 2 - Symptomatic, <50% confined to bed  Vitals:   04/01/20 0833  BP: (!) 119/92  Pulse: (!) 104  Resp: 20  Temp: 97.7 F (36.5 C)  SpO2: 100%   Filed Weights   04/01/20 0833  Weight: 131  lb 8 oz (59.6 kg)    GENERAL:alert, no distress and comfortable SKIN: skin color, texture, turgor are normal, no rashes or significant lesions EYES: normal, Conjunctiva are pink and non-injected, sclera clear  NECK: supple, thyroid normal size, non-tender, without nodularity LYMPH:  no palpable lymphadenopathy in the cervical, axillary  LUNGS: clear to auscultation and percussion with normal breathing effort HEART: regular rate & rhythm and no murmurs and no lower extremity edema ABDOMEN:abdomen soft, non-tender and normal bowel sounds (+) J tube in place, covered in guaze Musculoskeletal:no cyanosis of digits and no clubbing  NEURO: alert & oriented x 3 with fluent speech, no focal motor/sensory deficits  LABORATORY DATA:  I have reviewed the data as listed CBC Latest Ref Rng & Units 04/01/2020 03/25/2020 03/18/2020  WBC 4.0 - 10.5 K/uL  6.2 9.3 12.4(H)  Hemoglobin 12.0 - 15.0 g/dL 11.0(L) 11.5(L) 11.9(L)  Hematocrit 36 - 46 % 32.9(L) 33.9(L) 35.0(L)  Platelets 150 - 400 K/uL 311 419(H) 392     CMP Latest Ref Rng & Units 03/25/2020 03/18/2020 03/11/2020  Glucose 70 - 99 mg/dL 125(H) 128(H) 111(H)  BUN 6 - 20 mg/dL 14 18 11   Creatinine 0.44 - 1.00 mg/dL 0.68 0.67 0.69  Sodium 135 - 145 mmol/L 138 141 141  Potassium 3.5 - 5.1 mmol/L 3.6 3.6 4.0  Chloride 98 - 111 mmol/L 103 106 105  CO2 22 - 32 mmol/L 23 22 21(L)  Calcium 8.9 - 10.3 mg/dL 9.5 9.5 9.6  Total Protein 6.5 - 8.1 g/dL 6.8 6.7 6.8  Total Bilirubin 0.3 - 1.2 mg/dL 0.6 0.5 0.7  Alkaline Phos 38 - 126 U/L 67 75 74  AST 15 - 41 U/L 11(L) 18 20  ALT 0 - 44 U/L 12 15 15       RADIOGRAPHIC STUDIES: I have personally reviewed the radiological images as listed and agreed with the findings in the report. No results found.   ASSESSMENT & PLAN:  AUBRII SHARPLESS is a 49 y.o. female with    1. Esophagealsquamous cell carcinoma,in midesophagus, cTxN1M0 -She was diagnosed in 02/2020. EGD showeda partially obstructing tumor, biopsy confirmedinvasive squamous cells carcinoma. Imaging indicates mildly enlarged LNs lateral to the GEJ, she has locally advanced disease. There is no evidence of distant metastasis. -Her 03/01/20 PET scan showed known esophageal mass and hypermetabolic LNs in the paraesophageal, retrotracheal, and gastrohepatic areas, consistent with locally advanced disease. -I previously discussed although her cancer appears localized itis locallyadvancedwith obstruction and reginal node involvement. Given the positive nodes on CT, I do not think she needs EUS. -I previously discussed for locally advanced disease standard treatment includesneoadjuvant concurrent chemoradiation followed by esophagectomysurgery which is curative, but it is overall aggressive cancer, and has high risk of recurrence. -She started concurrent chemoRT with weekly carboplatin and  Taxol on 03/18/20  -S/p week 2 she has tolerated treatment moderately well with weight loss and more fatigue. She notes increased thirst recently. I discussed increasing fluids by mouth or feeding tube.  -Labs reviewed and adequate to proceed with Week 3 CT today. Continue radiation.  -I discussed consulting with cardiothoracic surgeon for definitive surgery. I discussed the possibility of cure from chemoRT alone is low, surgery is the standard treatment to increase her possible;ity of cure. She is very apprehensive about another surgery, but is willing to proceed with consult after she completes chemoRT. She declined surgical consult now  -F/u next week.   2. Dysphagia, Odynophagia, Weight loss, Malnutrition, Dehydration -She has had mild Dysphagia, Odynophagia  for 1 year but has worsened in the last 2-3 months with mid upper right chest pain 8-9/10 with eating and less pain otherwise.  -She had lost 15 pound over 6 weeks and reduced eating to jello, pudding, milkshakes. She takes liquid tylenol for pain.  -She had J-Tube placement as inpatient on 02/22/20. She will continue to f/u with dietician for management.  -She has been feeding with pump during the day and night 33ml over 20 hours a day along with Ensure TID via gravity. She is not taking anything by mouth except crushed pills. She has been able to gain weight.  -She previously had 2 days of no BM, which has resolved and otherwise no diarrhea. I recommend she use stool softener if needed to have regular BM.  -She has lost 4 pounds in the past week. Prior to start of treatment she was 140 pounds, now 131. She will f/u with Dietician as she may need to increase her tube feeding.    3.Excessive alcohol usage and heavysmokinghistory,alcohol Cessation  -She has quit smoking 2 years ago and stopped using [place] 1 year ago. She rarely drinks alcohol now and stopped since her pain, but was drinking for 50 years.  -I previously discussed  her history of long term smoking and alcohol use is related to this type of esophageal cancer. I previously advised her to quit drinking alcohol completely. She agreed.     4. B12 Deficiency  -She has been on oral B12 but has not been able to swallow due to #1/#2 -B12 level 2136 on 5/13, no need for B12 injections currently, will monitor -She has macrocytosis, B12 from 03/05/20 was 2183.    5. Social and Acupuncturist  -She is married with no children. She has in-laws in town -She is currently unemployed since late 2019, she was working in a bank. Her husband is a bar tender -She currently does not have insurance, but is looking to get coverage.  -F/u withfinancial advocate to help apply for grants and coverage   PLAN: -I refilled Claritin today  -She will continue to hold HTN meds. BP normal.  -F/u with Dietician today  -Labs reviewed and adequate to proceed with week 3 CT Today if CMP unremarkable  -Continue Radiation  -Lab, chemo CT in 1, 2, 3 weeks.    No problem-specific Assessment & Plan notes found for this encounter.   No orders of the defined types were placed in this encounter.  All questions were answered. The patient knows to call the clinic with any problems, questions or concerns. No barriers to learning was detected. The total time spent in the appointment was 30 minutes.     Truitt Merle, MD 04/01/2020   I, Joslyn Devon, am acting as scribe for Truitt Merle, MD.   I have reviewed the above documentation for accuracy and completeness, and I agree with the above.

## 2020-03-21 ENCOUNTER — Ambulatory Visit: Payer: Self-pay

## 2020-03-21 ENCOUNTER — Other Ambulatory Visit: Payer: Self-pay

## 2020-03-21 ENCOUNTER — Ambulatory Visit
Admission: RE | Admit: 2020-03-21 | Discharge: 2020-03-21 | Disposition: A | Discharge status: Home / Self Care | Payer: Self-pay | Source: Ambulatory Visit | Attending: Radiation Oncology | Admitting: Radiation Oncology

## 2020-03-21 NOTE — Progress Notes (Signed)
Pt here for patient teaching.  Pt given Radiation and You booklet and skin care instructions.  Reviewed areas of pertinence such as fatigue, hair loss, skin changes and throat changes . Pt able to give teach back of to pat skin and use unscented/gentle soap,avoid applying anything to skin within 4 hours of treatment. Pt verbalizes understanding of information given and will contact nursing with any questions or concerns.     Gloriajean Dell. Leonie Green, BSN

## 2020-03-22 ENCOUNTER — Ambulatory Visit: Payer: Self-pay

## 2020-03-22 ENCOUNTER — Other Ambulatory Visit: Payer: Self-pay

## 2020-03-22 ENCOUNTER — Ambulatory Visit
Admission: RE | Admit: 2020-03-22 | Discharge: 2020-03-22 | Disposition: A | Discharge status: Home / Self Care | Payer: Self-pay | Source: Ambulatory Visit | Attending: Radiation Oncology | Admitting: Radiation Oncology

## 2020-03-22 DIAGNOSIS — C154 Malignant neoplasm of middle third of esophagus: Secondary | ICD-10-CM

## 2020-03-22 MED ORDER — SONAFINE EX EMUL
1.0000 "application " | Freq: Once | CUTANEOUS | Status: AC
  Administered 2020-03-22: 1 via TOPICAL

## 2020-03-22 MED FILL — Dexamethasone Sodium Phosphate Inj 100 MG/10ML: INTRAMUSCULAR | Qty: 2 | Status: AC

## 2020-03-25 ENCOUNTER — Inpatient Hospital Stay: Payer: Self-pay

## 2020-03-25 ENCOUNTER — Encounter: Payer: Self-pay | Admitting: Nurse Practitioner

## 2020-03-25 ENCOUNTER — Ambulatory Visit
Admission: RE | Admit: 2020-03-25 | Discharge: 2020-03-25 | Disposition: A | Discharge status: Home / Self Care | Payer: Self-pay | Source: Ambulatory Visit | Attending: Radiation Oncology | Admitting: Radiation Oncology

## 2020-03-25 ENCOUNTER — Inpatient Hospital Stay: Payer: Self-pay | Admitting: Nutrition

## 2020-03-25 ENCOUNTER — Ambulatory Visit: Payer: Self-pay

## 2020-03-25 ENCOUNTER — Inpatient Hospital Stay (HOSPITAL_BASED_OUTPATIENT_CLINIC_OR_DEPARTMENT_OTHER): Payer: Self-pay | Admitting: Nurse Practitioner

## 2020-03-25 ENCOUNTER — Other Ambulatory Visit: Payer: Self-pay

## 2020-03-25 VITALS — BP 121/91 | HR 99 | Temp 98.6°F | Resp 17 | Ht 65.0 in | Wt 135.3 lb

## 2020-03-25 DIAGNOSIS — C154 Malignant neoplasm of middle third of esophagus: Secondary | ICD-10-CM

## 2020-03-25 DIAGNOSIS — C155 Malignant neoplasm of lower third of esophagus: Secondary | ICD-10-CM

## 2020-03-25 LAB — CMP (CANCER CENTER ONLY)
ALT: 12 U/L (ref 0–44)
AST: 11 U/L — ABNORMAL LOW (ref 15–41)
Albumin: 3.2 g/dL — ABNORMAL LOW (ref 3.5–5.0)
Alkaline Phosphatase: 67 U/L (ref 38–126)
Anion gap: 12 (ref 5–15)
BUN: 14 mg/dL (ref 6–20)
CO2: 23 mmol/L (ref 22–32)
Calcium: 9.5 mg/dL (ref 8.9–10.3)
Chloride: 103 mmol/L (ref 98–111)
Creatinine: 0.68 mg/dL (ref 0.44–1.00)
GFR, Est AFR Am: >60 mL/min (ref >60)
GFR, Estimated: >60 mL/min (ref >60)
Glucose, Bld: 125 mg/dL — ABNORMAL HIGH (ref 70–99)
Potassium: 3.6 mmol/L (ref 3.5–5.1)
Sodium: 138 mmol/L (ref 135–145)
Total Bilirubin: 0.6 mg/dL (ref 0.3–1.2)
Total Protein: 6.8 g/dL (ref 6.5–8.1)

## 2020-03-25 LAB — CBC WITH DIFFERENTIAL (CANCER CENTER ONLY)
Abs Immature Granulocytes: 0.08 10*3/uL — ABNORMAL HIGH (ref 0.00–0.07)
Basophils Absolute: 0 10*3/uL (ref 0.0–0.1)
Basophils Relative: 0 %
Eosinophils Absolute: 0.1 10*3/uL (ref 0.0–0.5)
Eosinophils Relative: 1 %
HCT: 33.9 % — ABNORMAL LOW (ref 36.0–46.0)
Hemoglobin: 11.5 g/dL — ABNORMAL LOW (ref 12.0–15.0)
Immature Granulocytes: 1 %
Lymphocytes Relative: 19 %
Lymphs Abs: 1.8 10*3/uL (ref 0.7–4.0)
MCH: 35 pg — ABNORMAL HIGH (ref 26.0–34.0)
MCHC: 33.9 g/dL (ref 30.0–36.0)
MCV: 103 fL — ABNORMAL HIGH (ref 80.0–100.0)
Monocytes Absolute: 1.3 10*3/uL — ABNORMAL HIGH (ref 0.1–1.0)
Monocytes Relative: 14 %
Neutro Abs: 6 10*3/uL (ref 1.7–7.7)
Neutrophils Relative %: 65 %
Platelet Count: 419 10*3/uL — ABNORMAL HIGH (ref 150–400)
RBC: 3.29 MIL/uL — ABNORMAL LOW (ref 3.87–5.11)
RDW: 12.1 % (ref 11.5–15.5)
WBC Count: 9.3 10*3/uL (ref 4.0–10.5)
nRBC: 0 % (ref 0.0–0.2)

## 2020-03-25 LAB — IRON AND TIBC
Iron: 54 ug/dL (ref 41–142)
Saturation Ratios: 21 % (ref 21–57)
TIBC: 264 ug/dL (ref 236–444)
UIBC: 210 ug/dL (ref 120–384)

## 2020-03-25 LAB — VITAMIN B12: Vitamin B-12: 716 pg/mL (ref 180–914)

## 2020-03-25 LAB — FERRITIN: Ferritin: 257 ng/mL (ref 11–307)

## 2020-03-25 MED ORDER — FAMOTIDINE IN NACL 20-0.9 MG/50ML-% IV SOLN
20.0000 mg | Freq: Once | INTRAVENOUS | Status: AC
  Administered 2020-03-25: 20 mg via INTRAVENOUS

## 2020-03-25 MED ORDER — SODIUM CHLORIDE 0.9 % IV SOLN
218.2000 mg | Freq: Once | INTRAVENOUS | Status: AC
  Administered 2020-03-25: 220 mg via INTRAVENOUS
  Filled 2020-03-25: qty 22

## 2020-03-25 MED ORDER — SODIUM CHLORIDE 0.9 % IV SOLN
Freq: Once | INTRAVENOUS | Status: AC
  Filled 2020-03-25: qty 250

## 2020-03-25 MED ORDER — DIPHENHYDRAMINE HCL 50 MG/ML IJ SOLN
INTRAMUSCULAR | Status: AC
  Filled 2020-03-25: qty 1

## 2020-03-25 MED ORDER — SODIUM CHLORIDE 0.9 % IV SOLN
10.0000 mg | Freq: Once | INTRAVENOUS | Status: AC
  Administered 2020-03-25: 10 mg via INTRAVENOUS
  Filled 2020-03-25: qty 10

## 2020-03-25 MED ORDER — FAMOTIDINE IN NACL 20-0.9 MG/50ML-% IV SOLN
INTRAVENOUS | Status: AC
  Filled 2020-03-25: qty 50

## 2020-03-25 MED ORDER — PALONOSETRON HCL INJECTION 0.25 MG/5ML
0.2500 mg | Freq: Once | INTRAVENOUS | Status: AC
  Administered 2020-03-25: 0.25 mg via INTRAVENOUS

## 2020-03-25 MED ORDER — SODIUM CHLORIDE 0.9 % IV SOLN
50.0000 mg/m2 | Freq: Once | INTRAVENOUS | Status: AC
  Administered 2020-03-25: 84 mg via INTRAVENOUS
  Filled 2020-03-25: qty 14

## 2020-03-25 MED ORDER — PALONOSETRON HCL INJECTION 0.25 MG/5ML
INTRAVENOUS | Status: AC
  Filled 2020-03-25: qty 5

## 2020-03-25 MED ORDER — DIPHENHYDRAMINE HCL 50 MG/ML IJ SOLN
50.0000 mg | Freq: Once | INTRAMUSCULAR | Status: AC
  Administered 2020-03-25: 50 mg via INTRAVENOUS

## 2020-03-25 NOTE — Addendum Note (Signed)
Addended by: Owens Shark on: 03/25/2020 10:05 AM   Modules accepted: Orders

## 2020-03-25 NOTE — Progress Notes (Signed)
Nutrition follow-up completed with patient during infusion for esophageal cancer. Weight decreased and documented as 135 pounds down from 138 pounds June 14. Noted glucose 125. Patient reports she is infusing Osmolite 1.5 at 72 mL an hour but only using 4-5 cartons daily.  She verbalizes how she hates being on the pump. She did not increase J-tube feedings to 82 mL an hour for 17 hours as was instructed at last visit.  She is giving an unknown amount of water via tube feeding bag and using continuous, but again I do not know how much volume she is getting. She denies vomiting, mouth sores, diarrhea. She has some constipation but is on MiraLAX and plans on adding a stool softener.  Estimated nutrition needs: 1900-2100 cal, 95-110 g protein, 2.0 L fluid.  6 cartons Osmolite 1.5 provides 2160 cal and 89 g protein.  If patient administers free water as advised she will total 2040 mL free water daily.  Nutrition diagnosis: Inadequate oral intake continues.  Intervention: Reminded patient to try to increase J-tube feedings to 82 mL an hour, using 6 cartons daily over 17 hours. Recommended she flush feeding tube with 120 mL of free water before and after bolus feedings.  In addition, recommend 120 mL free water 6 times during the day approximately every 2 hours. Reminded patient by increasing rates she will be off the pump more time.  Monitoring, evaluation, goals: Patient will work to increase tube feedings to minimize further weight loss.  Next visit: Monday, June 28 during infusion.  **Disclaimer: This note was dictated with voice recognition software. Similar sounding words can inadvertently be transcribed and this note may contain transcription errors which may not have been corrected upon publication of note.**

## 2020-03-25 NOTE — Progress Notes (Signed)
  [place] OFFICE PROGRESS NOTE   Diagnosis: Esophagus cancer  INTERVAL HISTORY:   Natalie Morrison returns as scheduled.  She began radiation 03/18/2020.  She completed cycle 1 weekly Taxol/carboplatin 03/18/2020.  He has nausea at baseline.  No change following chemotherapy.  No vomiting.  No mouth sores.  No diarrhea.  Some constipation.  She continues MiraLAX.  No numbness or tingling in the hands or feet.  She is tolerating the tube feedings.  She denies signs of allergic reaction with the first chemotherapy.  Objective:  Vital signs in last 24 hours:  Blood pressure (!) 121/91, pulse 99, temperature 98.6 F (37 C), temperature source Temporal, resp. rate 17, height 5\' 5"  (1.651 m), weight 135 lb 4.8 oz (61.4 kg), SpO2 99 %.    HEENT: White coating over the mid tongue. Resp: Lungs clear bilaterally. Cardio: Regular rate and rhythm. GI: Abdomen soft and nontender.  No hepatomegaly.  Left abdomen feeding tube site is without significant erythema.  Crusted drainage noted at site. Vascular: No leg edema. Neuro: Alert and oriented. Skin: Palms without erythema.   Lab Results:  Lab Results  Component Value Date   WBC 9.3 03/25/2020   HGB 11.5 (L) 03/25/2020   HCT 33.9 (L) 03/25/2020   MCV 103.0 (H) 03/25/2020   PLT 419 (H) 03/25/2020   NEUTROABS 6.0 03/25/2020    Imaging:  No results found.  Medications: I have reviewed the patient's current medications.  Assessment/Plan: 1. Esophagealsquamous cell carcinoma,in midesophagus, cTxN1M0  Radiation 03/18/2020  Cycle 1 weekly Taxol/carboplatin 03/18/2020  Cycle 2 weekly Taxol/carboplatin 03/25/2020 2. Dysphagia/odynophagia secondary to #1 3. History of alcohol and tobacco use 4. B12 deficiency  Disposition: [name] appears stable.  She continues radiation.  She tolerated the first weekly Taxol/carboplatin well overall.  Plan to proceed with the second weekly treatment today as scheduled.  We reviewed  the CBC from today.  Counts adequate to proceed as above.  She will return for lab, follow-up, third weekly Taxol/carboplatin in 1 week.  She will contact the office in the interim with any problems.    Ned Card ANP/GNP-BC   03/25/2020  9:55 AM

## 2020-03-25 NOTE — Patient Instructions (Signed)
[facility] Discharge Instructions for Patients Receiving Chemotherapy  Today you received the following chemotherapy agents Taxol and Carboplatin.   To help prevent nausea and vomiting after your treatment, we encourage you to take your nausea medication as prescribed.    If you develop nausea and vomiting that is not controlled by your nausea medication, call the clinic.   BELOW ARE SYMPTOMS THAT SHOULD BE REPORTED IMMEDIATELY:  *FEVER GREATER THAN 100.5 F  *CHILLS WITH OR WITHOUT FEVER  NAUSEA AND VOMITING THAT IS NOT CONTROLLED WITH YOUR NAUSEA MEDICATION  *UNUSUAL SHORTNESS OF BREATH  *UNUSUAL BRUISING OR BLEEDING  TENDERNESS IN MOUTH AND THROAT WITH OR WITHOUT PRESENCE OF ULCERS  *URINARY PROBLEMS  *BOWEL PROBLEMS  UNUSUAL RASH Items with * indicate a potential emergency and should be followed up as soon as possible.  Feel free to call the clinic should you have any questions or concerns. The clinic phone number is [phone].  Please show the CHEMO ALERT CARD at check-in to the Emergency Department and triage nurse.   

## 2020-03-26 ENCOUNTER — Ambulatory Visit: Payer: Self-pay

## 2020-03-26 ENCOUNTER — Other Ambulatory Visit: Payer: Self-pay

## 2020-03-26 ENCOUNTER — Ambulatory Visit
Admission: RE | Admit: 2020-03-26 | Discharge: 2020-03-26 | Disposition: A | Discharge status: Home / Self Care | Payer: Self-pay | Source: Ambulatory Visit | Attending: Radiation Oncology | Admitting: Radiation Oncology

## 2020-03-27 ENCOUNTER — Ambulatory Visit
Admission: RE | Admit: 2020-03-27 | Discharge: 2020-03-27 | Disposition: A | Discharge status: Home / Self Care | Payer: Self-pay | Source: Ambulatory Visit | Attending: Radiation Oncology | Admitting: Radiation Oncology

## 2020-03-27 ENCOUNTER — Other Ambulatory Visit: Payer: Self-pay

## 2020-03-27 ENCOUNTER — Ambulatory Visit: Payer: Self-pay

## 2020-03-28 ENCOUNTER — Ambulatory Visit
Admission: RE | Admit: 2020-03-28 | Discharge: 2020-03-28 | Disposition: A | Discharge status: Home / Self Care | Payer: Self-pay | Source: Ambulatory Visit | Attending: Radiation Oncology | Admitting: Radiation Oncology

## 2020-03-28 ENCOUNTER — Other Ambulatory Visit: Payer: Self-pay

## 2020-03-28 ENCOUNTER — Ambulatory Visit: Payer: Self-pay

## 2020-03-28 ENCOUNTER — Encounter: Payer: Self-pay | Admitting: Thoracic Surgery (Cardiothoracic Vascular Surgery)

## 2020-03-29 ENCOUNTER — Other Ambulatory Visit: Payer: Self-pay | Admitting: Radiation Oncology

## 2020-03-29 ENCOUNTER — Ambulatory Visit
Admission: RE | Admit: 2020-03-29 | Discharge: 2020-03-29 | Disposition: A | Discharge status: Home / Self Care | Payer: Self-pay | Source: Ambulatory Visit | Attending: Radiation Oncology | Admitting: Radiation Oncology

## 2020-03-29 ENCOUNTER — Telehealth: Payer: Self-pay | Admitting: *Deleted

## 2020-03-29 ENCOUNTER — Ambulatory Visit: Payer: Self-pay

## 2020-03-29 ENCOUNTER — Other Ambulatory Visit: Payer: Self-pay

## 2020-03-29 MED ORDER — HYDROCODONE-ACETAMINOPHEN 7.5-325 MG/15ML PO SOLN: 5.0000 mL | Freq: Three times a day (TID) | ORAL | 0 refills | Status: DC | PRN

## 2020-03-29 MED FILL — HYDROCOD-APAP 7.5-325/15ML: 7.5-325 | 8 days supply | Qty: 240 | Fill #0

## 2020-03-29 NOTE — Telephone Encounter (Signed)
patient did not wish to r/s at this time with Dr Kipp Brood

## 2020-03-30 ENCOUNTER — Ambulatory Visit: Payer: Self-pay

## 2020-04-01 ENCOUNTER — Encounter: Payer: Self-pay | Admitting: Hematology

## 2020-04-01 ENCOUNTER — Other Ambulatory Visit: Payer: Self-pay

## 2020-04-01 ENCOUNTER — Inpatient Hospital Stay: Payer: Self-pay

## 2020-04-01 ENCOUNTER — Inpatient Hospital Stay: Payer: Self-pay | Admitting: Nutrition

## 2020-04-01 ENCOUNTER — Ambulatory Visit
Admission: RE | Admit: 2020-04-01 | Discharge: 2020-04-01 | Disposition: A | Discharge status: Home / Self Care | Payer: Self-pay | Source: Ambulatory Visit | Attending: Radiation Oncology | Admitting: Radiation Oncology

## 2020-04-01 ENCOUNTER — Ambulatory Visit: Payer: Self-pay

## 2020-04-01 ENCOUNTER — Inpatient Hospital Stay (HOSPITAL_BASED_OUTPATIENT_CLINIC_OR_DEPARTMENT_OTHER): Payer: Self-pay | Admitting: Hematology

## 2020-04-01 VITALS — HR 96

## 2020-04-01 VITALS — BP 119/92 | HR 104 | Temp 97.7°F | Resp 20 | Ht 65.0 in | Wt 131.5 lb

## 2020-04-01 DIAGNOSIS — C154 Malignant neoplasm of middle third of esophagus: Secondary | ICD-10-CM

## 2020-04-01 DIAGNOSIS — C155 Malignant neoplasm of lower third of esophagus: Secondary | ICD-10-CM

## 2020-04-01 LAB — CBC WITH DIFFERENTIAL (CANCER CENTER ONLY)
Abs Immature Granulocytes: 0.04 10*3/uL (ref 0.00–0.07)
Basophils Absolute: 0 10*3/uL (ref 0.0–0.1)
Basophils Relative: 0 %
Eosinophils Absolute: 0.1 10*3/uL (ref 0.0–0.5)
Eosinophils Relative: 2 %
HCT: 32.9 % — ABNORMAL LOW (ref 36.0–46.0)
Hemoglobin: 11 g/dL — ABNORMAL LOW (ref 12.0–15.0)
Immature Granulocytes: 1 %
Lymphocytes Relative: 16 %
Lymphs Abs: 1 10*3/uL (ref 0.7–4.0)
MCH: 34.9 pg — ABNORMAL HIGH (ref 26.0–34.0)
MCHC: 33.4 g/dL (ref 30.0–36.0)
MCV: 104.4 fL — ABNORMAL HIGH (ref 80.0–100.0)
Monocytes Absolute: 1.1 10*3/uL — ABNORMAL HIGH (ref 0.1–1.0)
Monocytes Relative: 17 %
Neutro Abs: 4 10*3/uL (ref 1.7–7.7)
Neutrophils Relative %: 64 %
Platelet Count: 311 10*3/uL (ref 150–400)
RBC: 3.15 MIL/uL — ABNORMAL LOW (ref 3.87–5.11)
RDW: 12.5 % (ref 11.5–15.5)
WBC Count: 6.2 10*3/uL (ref 4.0–10.5)
nRBC: 0 % (ref 0.0–0.2)

## 2020-04-01 LAB — CMP (CANCER CENTER ONLY)
ALT: 13 U/L (ref 0–44)
AST: 9 U/L — ABNORMAL LOW (ref 15–41)
Albumin: 3.4 g/dL — ABNORMAL LOW (ref 3.5–5.0)
Alkaline Phosphatase: 66 U/L (ref 38–126)
Anion gap: 16 — ABNORMAL HIGH (ref 5–15)
BUN: 11 mg/dL (ref 6–20)
CO2: 21 mmol/L — ABNORMAL LOW (ref 22–32)
Calcium: 10 mg/dL (ref 8.9–10.3)
Chloride: 105 mmol/L (ref 98–111)
Creatinine: 0.66 mg/dL (ref 0.44–1.00)
GFR, Est AFR Am: >60 mL/min (ref >60)
GFR, Estimated: >60 mL/min (ref >60)
Glucose, Bld: 125 mg/dL — ABNORMAL HIGH (ref 70–99)
Potassium: 3.9 mmol/L (ref 3.5–5.1)
Sodium: 142 mmol/L (ref 135–145)
Total Bilirubin: 0.4 mg/dL (ref 0.3–1.2)
Total Protein: 7.2 g/dL (ref 6.5–8.1)

## 2020-04-01 LAB — IRON AND TIBC
Iron: 44 ug/dL (ref 41–142)
Saturation Ratios: 17 % — ABNORMAL LOW (ref 21–57)
TIBC: 263 ug/dL (ref 236–444)
UIBC: 219 ug/dL (ref 120–384)

## 2020-04-01 LAB — FERRITIN: Ferritin: 303 ng/mL (ref 11–307)

## 2020-04-01 LAB — VITAMIN B12: Vitamin B-12: 453 pg/mL (ref 180–914)

## 2020-04-01 MED ORDER — PALONOSETRON HCL INJECTION 0.25 MG/5ML
INTRAVENOUS | Status: AC
  Filled 2020-04-01: qty 5

## 2020-04-01 MED ORDER — DIPHENHYDRAMINE HCL 50 MG/ML IJ SOLN
50.0000 mg | Freq: Once | INTRAMUSCULAR | Status: AC
  Administered 2020-04-01: 50 mg via INTRAVENOUS

## 2020-04-01 MED ORDER — SODIUM CHLORIDE 0.9 % IV SOLN
218.2000 mg | Freq: Once | INTRAVENOUS | Status: AC
  Administered 2020-04-01: 220 mg via INTRAVENOUS
  Filled 2020-04-01: qty 22

## 2020-04-01 MED ORDER — SODIUM CHLORIDE 0.9 % IV SOLN
Freq: Once | INTRAVENOUS | Status: AC
  Filled 2020-04-01: qty 250

## 2020-04-01 MED ORDER — DIPHENHYDRAMINE HCL 50 MG/ML IJ SOLN
INTRAMUSCULAR | Status: AC
  Filled 2020-04-01: qty 1

## 2020-04-01 MED ORDER — LORATADINE 10 MG PO TABS: 10.0000 mg | ORAL_TABLET | Freq: Every day | ORAL | 2 refills | Status: DC

## 2020-04-01 MED ORDER — FAMOTIDINE IN NACL 20-0.9 MG/50ML-% IV SOLN
INTRAVENOUS | Status: AC
  Filled 2020-04-01: qty 50

## 2020-04-01 MED ORDER — FAMOTIDINE IN NACL 20-0.9 MG/50ML-% IV SOLN
20.0000 mg | Freq: Once | INTRAVENOUS | Status: AC
  Administered 2020-04-01: 20 mg via INTRAVENOUS

## 2020-04-01 MED ORDER — PALONOSETRON HCL INJECTION 0.25 MG/5ML
0.2500 mg | Freq: Once | INTRAVENOUS | Status: AC
  Administered 2020-04-01: 0.25 mg via INTRAVENOUS

## 2020-04-01 MED ORDER — SODIUM CHLORIDE 0.9 % IV SOLN
50.0000 mg/m2 | Freq: Once | INTRAVENOUS | Status: AC
  Administered 2020-04-01: 84 mg via INTRAVENOUS
  Filled 2020-04-01: qty 14

## 2020-04-01 MED ORDER — SODIUM CHLORIDE 0.9 % IV SOLN
10.0000 mg | Freq: Once | INTRAVENOUS | Status: AC
  Administered 2020-04-01: 10 mg via INTRAVENOUS
  Filled 2020-04-01: qty 10

## 2020-04-01 NOTE — Patient Instructions (Signed)
[facility] Discharge Instructions for Patients Receiving Chemotherapy  Today you received the following chemotherapy agents Taxol and Carboplatin.   To help prevent nausea and vomiting after your treatment, we encourage you to take your nausea medication as prescribed.    If you develop nausea and vomiting that is not controlled by your nausea medication, call the clinic.   BELOW ARE SYMPTOMS THAT SHOULD BE REPORTED IMMEDIATELY:  *FEVER GREATER THAN 100.5 F  *CHILLS WITH OR WITHOUT FEVER  NAUSEA AND VOMITING THAT IS NOT CONTROLLED WITH YOUR NAUSEA MEDICATION  *UNUSUAL SHORTNESS OF BREATH  *UNUSUAL BRUISING OR BLEEDING  TENDERNESS IN MOUTH AND THROAT WITH OR WITHOUT PRESENCE OF ULCERS  *URINARY PROBLEMS  *BOWEL PROBLEMS  UNUSUAL RASH Items with * indicate a potential emergency and should be followed up as soon as possible.  Feel free to call the clinic should you have any questions or concerns. The clinic phone number is [phone].  Please show the CHEMO ALERT CARD at check-in to the Emergency Department and triage nurse.   

## 2020-04-01 NOTE — Patient Instructions (Signed)
J-tube feedings to 82 mL an hour, using 6 cartons daily over 17 hours.  Flush feeding tube with 120 mL (1/2 cup) of free water before and after feedings.    Additional water flushes: 120 mL (1/2 cup) free water 6 times during the day approximately every 2 hours.

## 2020-04-01 NOTE — Progress Notes (Signed)
Nutrition follow-up completed with patient during infusion for esophageal cancer. Weight decreased and documented as 131.5 pounds on June 28.  Patient reports she has been able to increase Osmolite 1.5 to 90 mL an hour however she is still only getting in 4 cartons daily which is inadequate to meet nutrition needs. She continues to give 100 mL of water via pump instead of using a syringe for small bolus water flush. Weight decreased and documented as 131.5 pounds on June 28 down from 138 pounds June 14. Noted labs: Glucose 125 and albumin 3.4. Patient reports nausea continues. Constipation has improved. She denies other nutrition impact symptoms.  Estimated nutrition needs: 1900-2100 cal, 95-110 g protein, 2.0 L fluid.  4 cartons Osmolite 1.5 provides 1420 cal, 59.6 g protein and 724 mL free water which is 75% minimum calorie needs and 63% minimum protein needs. 6 cartons Osmolite 1.5 which is goal provides 2160 cal and 89 g of protein  Nutrition diagnosis: Inadequate oral intake continues.  Intervention: Reminded patient to run continuous jejunostomy feedings at 90 mL an hour over 16 hours. Explained importance of free water flushes of 120 mL using syringe 6 times daily approximately every 2 hours. Patient to continue nausea medication as needed.  Monitoring, evaluation, goals: Patient will continue to work to increase tube feeding and water flushes to meet greater than 90% estimated needs.  Next visit: Tuesday, July 6 during infusion.  **Disclaimer: This note was dictated with voice recognition software. Similar sounding words can inadvertently be transcribed and this note may contain transcription errors which may not have been corrected upon publication of note.**

## 2020-04-02 ENCOUNTER — Ambulatory Visit: Payer: Self-pay

## 2020-04-02 ENCOUNTER — Ambulatory Visit
Admission: RE | Admit: 2020-04-02 | Discharge: 2020-04-02 | Disposition: A | Discharge status: Home / Self Care | Payer: Self-pay | Source: Ambulatory Visit | Attending: Radiation Oncology | Admitting: Radiation Oncology

## 2020-04-02 ENCOUNTER — Other Ambulatory Visit: Payer: Self-pay

## 2020-04-03 ENCOUNTER — Other Ambulatory Visit: Payer: Self-pay

## 2020-04-03 ENCOUNTER — Ambulatory Visit: Payer: Self-pay

## 2020-04-03 ENCOUNTER — Ambulatory Visit
Admission: RE | Admit: 2020-04-03 | Discharge: 2020-04-03 | Disposition: A | Discharge status: Home / Self Care | Payer: Self-pay | Source: Ambulatory Visit | Attending: Radiation Oncology | Admitting: Radiation Oncology

## 2020-04-03 ENCOUNTER — Telehealth: Payer: Self-pay | Admitting: Hematology

## 2020-04-03 NOTE — Telephone Encounter (Signed)
No 6/28 los 

## 2020-04-04 ENCOUNTER — Other Ambulatory Visit: Payer: Self-pay

## 2020-04-04 ENCOUNTER — Ambulatory Visit: Payer: Self-pay

## 2020-04-04 ENCOUNTER — Ambulatory Visit
Admission: RE | Admit: 2020-04-04 | Discharge: 2020-04-04 | Disposition: A | Discharge status: Home / Self Care | Payer: Self-pay | Source: Ambulatory Visit | Attending: Radiation Oncology | Admitting: Radiation Oncology

## 2020-04-04 DIAGNOSIS — C154 Malignant neoplasm of middle third of esophagus: Secondary | ICD-10-CM | POA: Insufficient documentation

## 2020-04-05 ENCOUNTER — Ambulatory Visit: Payer: Self-pay

## 2020-04-05 ENCOUNTER — Ambulatory Visit
Admission: RE | Admit: 2020-04-05 | Discharge: 2020-04-05 | Disposition: A | Discharge status: Home / Self Care | Payer: Self-pay | Source: Ambulatory Visit | Attending: Radiation Oncology | Admitting: Radiation Oncology

## 2020-04-07 NOTE — Progress Notes (Signed)
[place]   [phone] [phone]   Clinic Follow up Note   Patient Care Team: Eulas Post, MD as PCP - General Jonnie Finner, RN as Oncology Nurse Navigator Truitt Merle, MD as Consulting Physician (Hematology) Michael Boston, MD as Consulting Physician (General Surgery) Kyung Rudd, MD as Consulting Physician (Radiation Oncology) Mauri Pole, MD as Consulting Physician (Gastroenterology) Jackelyn Knife, MD as Rounding Team (Internal Medicine) 04/09/2020  CHIEF COMPLAINT: F/u esophagus cancer   SUMMARY OF ONCOLOGIC HISTORY: Oncology History Overview Note  Cancer Staging Esophageal cancer [facility]) Staging form: Esophagus - Other Histologies, AJCC 8th Edition - Clinical stage from 02/11/2020: cTX, cN1, cM0 - Signed by Truitt Merle, MD on 02/14/2020    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 Biopsied. - Food in the middle third of the esophagus. Removal was successful.   02/08/2020 Initial Biopsy   Diagnosis 02/08/20 Esophagus, biopsy, mass - INVASIVE SQUAMOUS CELL CARCINOMA - SEE COMMENT Microscopic Comment Based on the biopsy, the carcinoma appears moderately differentiated. Dr. Silverio Decamp was paged on Feb 09, 2020. Dr. Jeannie Done reviewed the case and agrees with the above diagnosis.    02/11/2020 Cancer Staging   Staging form: Esophagus - Other Histologies, AJCC 8th Edition - Clinical stage from 02/11/2020: cTX, cN1, cM0 - Signed by Truitt Merle, MD on 02/14/2020   02/14/2020 Initial Diagnosis   Esophageal cancer ([place])   02/14/2020 Imaging   CT CAP w contrast  IMPRESSION: 1. Irregular wall thickening and an ill-defined mass involving the distal esophagus consistent with known esophageal cancer. 2. Mildly enlarged lymph nodes  lateral to the gastroesophageal junction and in the gastrohepatic ligament, suspicious for metastatic disease. 3. No evidence of distant metastatic disease. 4. 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 5. Aortic Atherosclerosis (ICD10-I70.0).   03/18/2020 -  Chemotherapy   Concurrent chemoRT with weekly carboplatin and Taxol starting 03/18/20   03/18/2020 -  Radiation Therapy   Concurrent chemo RT with weekly CT by Dr Lisbeth Renshaw starting 03/18/20     CURRENT THERAPY: Concurrent chemoRT with weekly carboplatin and Taxol starting6/14/21  INTERVAL HISTORY: Ms. Ruberg returns for f/u and treatment as scheduled. She completed third week chemo on 04/01/20 and continues RT. She is frustrated with her feeding tube schedule "it is not working."  Per instruction from dietitian, she has feeds going at goal for 20 hours with intermittent water boluses. She did that for 2 days in a row last Tuesday and Wednesday but "feels like I am drowning."  She has nausea during feedings, only vomited x1 this morning. Takes reglan, has not tried zofran or compazine. Denies constipation or diarrhea.  Per Rad onc she began introducing jello last week but had dysphagia so she stopped, has not used hycet.  She has mild pain at the tube site where the last suture is tugging at her skin. Her energy is low but stable, able to be out of bed doing light activities in the house.  Denies fever, chills, cough, chest pain, dyspnea, neuropathy.   MEDICAL HISTORY:  Past Medical History:  Diagnosis Date  . Alcohol abuse   . Anemia   . Anxiety   . Anxiety and depression   . Asthma   . Depression   . Hepatitis A    "when I was a  kid"  . Hypertension     SURGICAL HISTORY: Past Surgical History:  Procedure Laterality Date  . BREAST LUMPECTOMY Right   . GASTROSTOMY N/A 02/22/2020   Procedure: OPEN PLACEMENT JEJUNOSTOMY FEEDING TUBE;  Surgeon: Jesusita Oka, MD;  Location: [facility];  Service: General;   Laterality: N/A;  . TONSILLECTOMY AND ADENOIDECTOMY Bilateral over 30 years ago  . UPPER GASTROINTESTINAL ENDOSCOPY      I have reviewed the social history and family history with the patient and they are unchanged from previous note.  ALLERGIES:  is allergic to nitrofurantoin and sulfonamide derivatives.  MEDICATIONS:  Current Outpatient Medications  Medication Sig Dispense Refill  . acetaminophen (TYLENOL) 325 MG tablet Take 650 mg by mouth every 6 (six) hours as needed for mild pain or headache.    Marland Kitchen amLODipine (NORVASC) 10 MG tablet 1 tablet (10 mg total) by Per J Tube route daily. 30 tablet 0  . Cyanocobalamin 1000 MCG/15ML LIQD Take 15 mLs (1,000 mcg total) by mouth daily. 161 mL 1  . folic acid (FOLVITE) 1 MG tablet Place 1 tablet (1 mg total) into feeding tube daily. 30 tablet 1  . HYDROcodone-acetaminophen (HYCET) 7.5-325 mg/15 ml solution Take 5-10 mLs by mouth every 8 (eight) hours as needed for moderate pain or severe pain. Take 30 minutes before meal for severe pain 240 mL 0  . loratadine (CLARITIN) 10 MG tablet Take 1 tablet (10 mg total) by mouth daily. 30 tablet 2  . metoCLOPramide (REGLAN) 5 MG tablet Place 1 tablet (5 mg total) into feeding tube 3 (three) times daily. 30 tablet 1  . Nutritional Supplements (FEEDING SUPPLEMENT, OSMOLITE 1.5 CAL,) LIQD Place 1,000 mLs into feeding tube continuous.  0  . ondansetron (ZOFRAN ODT) 4 MG disintegrating tablet Take 1 tablet (4 mg total) by mouth every 8 (eight) hours as needed for nausea or vomiting. 20 tablet 0  . ondansetron (ZOFRAN) 8 MG tablet Take 1 tablet (8 mg total) by mouth 2 (two) times daily as needed for refractory nausea / vomiting. Start on day 3 after chemo. 30 tablet 1  . prochlorperazine (COMPAZINE) 10 MG tablet Take 1 tablet (10 mg total) by mouth every 6 (six) hours as needed (Nausea or vomiting). 30 tablet 1  . simethicone (MYLICON) 40 WR/6.0AV drops Place 0.6 mLs (40 mg total) into feeding tube 4 (four) times  daily as needed (bloating). 30 mL 0  . thiamine 100 MG tablet 1 tablet (100 mg total) by Per J Tube route daily. 30 tablet 1  . sucralfate (CARAFATE) 1 GM/10ML suspension Take 10 mLs (1 g total) by mouth 4 (four) times daily -  with meals and at bedtime. 420 mL 0   Current Facility-Administered Medications  Medication Dose Route Frequency Provider Last Rate Last Admin  . 0.9 %  sodium chloride infusion  500 mL Intravenous Once Nandigam, Kavitha V, MD      . heparin lock flush 100 unit/mL  500 Units Intravenous Once Cira Rue K, NP      . sodium chloride flush (NS) 0.9 % injection 10 mL  10 mL Intravenous PRN Alla Feeling, NP       Facility-Administered Medications Ordered in Other Visits  Medication Dose Route Frequency Provider Last Rate Last Admin  . [START ON 04/11/2020] 0.9 %  sodium chloride infusion   Intravenous Once Alla Feeling, NP        PHYSICAL EXAMINATION: ECOG PERFORMANCE STATUS: 1-2  Vitals:   04/09/20 0915  BP: 103/80  Pulse: (!) 108  Resp: 17  Temp: 99 F (37.2 C)  SpO2: 99%   Filed Weights   04/09/20 0915  Weight: 129 lb 3.2 oz (58.6 kg)    GENERAL:alert, no distress and comfortable SKIN: no rash to exposed skin  EYES: sclera clear LUNGS:  normal breathing effort HEART: no lower extremity edema ABDOMEN:abdomen soft, non-tender and normal bowel sounds. Feeding tube in place, 1 of 3 sutures intact. Mild erythema from tension at remaining suture. No drainage. Dressing c/d/i.  NEURO: flat affect, oriented x 3 with fluent speech, normal gait  LABORATORY DATA:  I have reviewed the data as listed CBC Latest Ref Rng & Units 04/09/2020 04/01/2020 03/25/2020  WBC 4.0 - 10.5 K/uL 7.8 6.2 9.3  Hemoglobin 12.0 - 15.0 g/dL 10.6(L) 11.0(L) 11.5(L)  Hematocrit 36 - 46 % 32.2(L) 32.9(L) 33.9(L)  Platelets 150 - 400 K/uL 349 311 419(H)     CMP Latest Ref Rng & Units 04/09/2020 04/01/2020 03/25/2020  Glucose 70 - 99 mg/dL 133(H) 125(H) 125(H)  BUN 6 - 20 mg/dL 11 11  14   Creatinine 0.44 - 1.00 mg/dL 0.69 0.66 0.68  Sodium 135 - 145 mmol/L 139 142 138  Potassium 3.5 - 5.1 mmol/L 3.8 3.9 3.6  Chloride 98 - 111 mmol/L 101 105 103  CO2 22 - 32 mmol/L 23 21(L) 23  Calcium 8.9 - 10.3 mg/dL 10.2 10.0 9.5  Total Protein 6.5 - 8.1 g/dL 7.4 7.2 6.8  Total Bilirubin 0.3 - 1.2 mg/dL 0.5 0.4 0.6  Alkaline Phos 38 - 126 U/L 75 66 67  AST 15 - 41 U/L 10(L) 9(L) 11(L)  ALT 0 - 44 U/L 10 13 12       RADIOGRAPHIC STUDIES: I have personally reviewed the radiological images as listed and agreed with the findings in the report. No results found.   ASSESSMENT & PLAN: VERNEL DONLAN a 49 y.o.Caucasianfemalewith a history of Alcohol abuse, anxiety/depression HTN   1. Esophagealsquamous cell carcinoma,in midesophagus, cTxN1M0 -EGD showeda partially obstructing tumor, biopsy confirmedinvasive squamous cells carcinoma. Imaging indicates mildly enlarged LNs lateral to the GEJ, she has locally advanced disease. There is no evidence of distant metastasis. -I reviewed 03/01/20 PET scan independently and with the patient which shows known esophageal mass and hypermetabolic LNs in the paraesophageal, retrotracheal, and gastrohepatic areas, consistent with locally advanced disease. This was discussed with Shona Simpson, PA in RT. -She previously consented toneoadjuvantchemotherapywith taxol and carboplatin concurrent with radiation starting 03/18/20. Goal of therapy is curative -She was referred to cardiothoracic surgery  2. Dysphagia, Odynophagia, Weight loss, Malnutrition, Dehydration -She has had mild Dysphagia, Odynophagia for 1 year but has worsened in the last 2-3 months with mid upper right chest pain 8-9/10 with eating and less pain otherwise.  -She has lost 15 pound in the last 6 weeks -Drinking Ensure and water, liquid Hycet, mostly liquid diet -She was hospitalized 5/18 - 5/25 for J tube placement, complicated by n/v  -followed by nutrition    3.Excessive alcohol usage and heavysmokinghistory,alcohol Cessation  -She has quit smoking 2 years ago and stopped using Deweese 1 year ago.H/o alcohol use over many decades.  -she has been counseled to quit smoking and drinking alcohol completely  4. B12 Deficiency  -She has been on oral B12 but has not been able to swallow due to #1/#2 -B12 level 2136 on 5/13, no need for B12 inj currently, will monitor -she has macrocytosis, B12 level is 514 on 03/18/20 which is normal range  but  dropped from 2183 in 02/2020. I recommended oral liquid B12  5. Social and Acupuncturist  -She is married with no children. She has in-laws in town. Currently unemployed since late 2019, she was working in a bank. Her husband is a bar tender -She currently does not have insurance, but is looking to get coverage.  -F/u wtihfinancial advocate to help apply for grants and coverage   Disposition Ms. Honold appears stable. She completed week 3 chemoRT. She is tolerating treatment moderately well, mainly with fatigue.   Her main issue is intolerance to tube feeding. I discussed with Dory Peru who saw the patient today, plans to change her formula. I reviewed with her to continue reglan and begin zofran and compazine PRN. Per RT she has started introducing soft diet, still having moderate dysphagia. I prescribed sucralfate and encouraged her to use Hycet PRN.   CBC and CMP stable. Iron and B12 levels are adequate. BP is soft with mild tachycardia, likely mild dehydration secondary to tube feeding intolerance. She will get IVF with treatment today and later this week.   She will proceed with week 4 taxol and carboplatin today as planned. Her feeding tube dressing was reinforced. She will f/u next week before week 5 chemo.  All questions were answered. The patient knows to call the clinic with any problems, questions or concerns. No barriers to learning was detected. Total encounter time was 30  minutes.      Alla Feeling, NP 04/09/20

## 2020-04-08 ENCOUNTER — Ambulatory Visit: Payer: Self-pay

## 2020-04-09 ENCOUNTER — Inpatient Hospital Stay: Payer: Self-pay | Admitting: Nutrition

## 2020-04-09 ENCOUNTER — Inpatient Hospital Stay: Payer: Self-pay

## 2020-04-09 ENCOUNTER — Ambulatory Visit
Admission: RE | Admit: 2020-04-09 | Discharge: 2020-04-09 | Disposition: A | Discharge status: Home / Self Care | Payer: Self-pay | Source: Ambulatory Visit | Attending: Radiation Oncology | Admitting: Radiation Oncology

## 2020-04-09 ENCOUNTER — Ambulatory Visit: Payer: Self-pay

## 2020-04-09 ENCOUNTER — Inpatient Hospital Stay: Payer: Self-pay | Attending: Hematology

## 2020-04-09 ENCOUNTER — Other Ambulatory Visit: Payer: Self-pay

## 2020-04-09 ENCOUNTER — Inpatient Hospital Stay (HOSPITAL_BASED_OUTPATIENT_CLINIC_OR_DEPARTMENT_OTHER): Payer: Self-pay | Admitting: Nurse Practitioner

## 2020-04-09 ENCOUNTER — Encounter: Payer: Self-pay | Admitting: Nurse Practitioner

## 2020-04-09 VITALS — BP 103/80 | HR 108 | Temp 99.0°F | Resp 17 | Ht 65.0 in | Wt 129.2 lb

## 2020-04-09 VITALS — HR 85

## 2020-04-09 DIAGNOSIS — E538 Deficiency of other specified B group vitamins: Secondary | ICD-10-CM | POA: Insufficient documentation

## 2020-04-09 DIAGNOSIS — Z5111 Encounter for antineoplastic chemotherapy: Secondary | ICD-10-CM | POA: Insufficient documentation

## 2020-04-09 DIAGNOSIS — I10 Essential (primary) hypertension: Secondary | ICD-10-CM | POA: Insufficient documentation

## 2020-04-09 DIAGNOSIS — E860 Dehydration: Secondary | ICD-10-CM | POA: Insufficient documentation

## 2020-04-09 DIAGNOSIS — C154 Malignant neoplasm of middle third of esophagus: Secondary | ICD-10-CM

## 2020-04-09 DIAGNOSIS — R1310 Dysphagia, unspecified: Secondary | ICD-10-CM | POA: Insufficient documentation

## 2020-04-09 DIAGNOSIS — F1010 Alcohol abuse, uncomplicated: Secondary | ICD-10-CM | POA: Insufficient documentation

## 2020-04-09 DIAGNOSIS — R112 Nausea with vomiting, unspecified: Secondary | ICD-10-CM | POA: Insufficient documentation

## 2020-04-09 DIAGNOSIS — C155 Malignant neoplasm of lower third of esophagus: Secondary | ICD-10-CM

## 2020-04-09 LAB — CBC WITH DIFFERENTIAL (CANCER CENTER ONLY)
Abs Immature Granulocytes: 0.08 10*3/uL — ABNORMAL HIGH (ref 0.00–0.07)
Basophils Absolute: 0 10*3/uL (ref 0.0–0.1)
Basophils Relative: 0 %
Eosinophils Absolute: 0 10*3/uL (ref 0.0–0.5)
Eosinophils Relative: 0 %
HCT: 32.2 % — ABNORMAL LOW (ref 36.0–46.0)
Hemoglobin: 10.6 g/dL — ABNORMAL LOW (ref 12.0–15.0)
Immature Granulocytes: 1 %
Lymphocytes Relative: 8 %
Lymphs Abs: 0.7 10*3/uL (ref 0.7–4.0)
MCH: 34.3 pg — ABNORMAL HIGH (ref 26.0–34.0)
MCHC: 32.9 g/dL (ref 30.0–36.0)
MCV: 104.2 fL — ABNORMAL HIGH (ref 80.0–100.0)
Monocytes Absolute: 1.4 10*3/uL — ABNORMAL HIGH (ref 0.1–1.0)
Monocytes Relative: 18 %
Neutro Abs: 5.7 10*3/uL (ref 1.7–7.7)
Neutrophils Relative %: 73 %
Platelet Count: 349 10*3/uL (ref 150–400)
RBC: 3.09 MIL/uL — ABNORMAL LOW (ref 3.87–5.11)
RDW: 13 % (ref 11.5–15.5)
WBC Count: 7.8 10*3/uL (ref 4.0–10.5)
nRBC: 0 % (ref 0.0–0.2)

## 2020-04-09 LAB — CMP (CANCER CENTER ONLY)
ALT: 10 U/L (ref 0–44)
AST: 10 U/L — ABNORMAL LOW (ref 15–41)
Albumin: 3.3 g/dL — ABNORMAL LOW (ref 3.5–5.0)
Alkaline Phosphatase: 75 U/L (ref 38–126)
Anion gap: 15 (ref 5–15)
BUN: 11 mg/dL (ref 6–20)
CO2: 23 mmol/L (ref 22–32)
Calcium: 10.2 mg/dL (ref 8.9–10.3)
Chloride: 101 mmol/L (ref 98–111)
Creatinine: 0.69 mg/dL (ref 0.44–1.00)
GFR, Est AFR Am: >60 mL/min (ref >60)
GFR, Estimated: >60 mL/min (ref >60)
Glucose, Bld: 133 mg/dL — ABNORMAL HIGH (ref 70–99)
Potassium: 3.8 mmol/L (ref 3.5–5.1)
Sodium: 139 mmol/L (ref 135–145)
Total Bilirubin: 0.5 mg/dL (ref 0.3–1.2)
Total Protein: 7.4 g/dL (ref 6.5–8.1)

## 2020-04-09 LAB — IRON AND TIBC
Iron: 46 ug/dL (ref 41–142)
Saturation Ratios: 18 % — ABNORMAL LOW (ref 21–57)
TIBC: 259 ug/dL (ref 236–444)
UIBC: 213 ug/dL (ref 120–384)

## 2020-04-09 LAB — FERRITIN: Ferritin: 393 ng/mL — ABNORMAL HIGH (ref 11–307)

## 2020-04-09 LAB — VITAMIN B12: Vitamin B-12: 453 pg/mL (ref 180–914)

## 2020-04-09 MED ORDER — FAMOTIDINE IN NACL 20-0.9 MG/50ML-% IV SOLN
INTRAVENOUS | Status: AC
  Filled 2020-04-09: qty 50

## 2020-04-09 MED ORDER — FAMOTIDINE IN NACL 20-0.9 MG/50ML-% IV SOLN
20.0000 mg | Freq: Once | INTRAVENOUS | Status: AC
  Administered 2020-04-09: 20 mg via INTRAVENOUS

## 2020-04-09 MED ORDER — DIPHENHYDRAMINE HCL 50 MG/ML IJ SOLN
INTRAMUSCULAR | Status: AC
  Filled 2020-04-09: qty 1

## 2020-04-09 MED ORDER — SUCRALFATE 1 GM/10ML PO SUSP
1.0000 g | Freq: Three times a day (TID) | ORAL | 0 refills | Status: DC
Start: 2020-04-09 — End: 2020-04-15

## 2020-04-09 MED ORDER — SODIUM CHLORIDE 0.9% FLUSH
10.0000 mL | INTRAVENOUS | Status: DC | PRN
  Filled 2020-04-09: qty 10

## 2020-04-09 MED ORDER — DIPHENHYDRAMINE HCL 50 MG/ML IJ SOLN
50.0000 mg | Freq: Once | INTRAMUSCULAR | Status: AC
  Administered 2020-04-09: 50 mg via INTRAVENOUS

## 2020-04-09 MED ORDER — SODIUM CHLORIDE 0.9 % IV SOLN
10.0000 mg | Freq: Once | INTRAVENOUS | Status: AC
  Administered 2020-04-09: 10 mg via INTRAVENOUS
  Filled 2020-04-09: qty 10

## 2020-04-09 MED ORDER — SODIUM CHLORIDE 0.9 % IV SOLN
50.0000 mg/m2 | Freq: Once | INTRAVENOUS | Status: AC
  Administered 2020-04-09: 84 mg via INTRAVENOUS
  Filled 2020-04-09: qty 14

## 2020-04-09 MED ORDER — SODIUM CHLORIDE 0.9 % IV SOLN
218.2000 mg | Freq: Once | INTRAVENOUS | Status: AC
  Administered 2020-04-09: 220 mg via INTRAVENOUS
  Filled 2020-04-09: qty 22

## 2020-04-09 MED ORDER — HEPARIN SOD (PORK) LOCK FLUSH 100 UNIT/ML IV SOLN
500.0000 [IU] | Freq: Once | INTRAVENOUS | Status: DC
  Filled 2020-04-09: qty 5

## 2020-04-09 MED ORDER — SODIUM CHLORIDE 0.9 % IV SOLN
Freq: Once | INTRAVENOUS | Status: DC
  Filled 2020-04-09: qty 250

## 2020-04-09 MED ORDER — METOCLOPRAMIDE HCL 5 MG PO TABS: 5.0000 mg | ORAL_TABLET | Freq: Three times a day (TID) | ORAL | 1 refills | Status: DC

## 2020-04-09 MED ORDER — SODIUM CHLORIDE 0.9 % IV SOLN
Freq: Once | INTRAVENOUS | Status: AC
  Filled 2020-04-09: qty 250

## 2020-04-09 MED ORDER — PALONOSETRON HCL INJECTION 0.25 MG/5ML
0.2500 mg | Freq: Once | INTRAVENOUS | Status: AC
  Administered 2020-04-09: 0.25 mg via INTRAVENOUS

## 2020-04-09 MED ORDER — PALONOSETRON HCL INJECTION 0.25 MG/5ML
INTRAVENOUS | Status: AC
  Filled 2020-04-09: qty 5

## 2020-04-09 NOTE — Progress Notes (Signed)
Nutrition follow-up completed with patient during infusion for esophageal cancer. Patient is tearful and reports tube feeding is not working for her. She reports she is so tired and very weak. She is only infusing 3-4 cartons of Osmolite 1.5 daily secondary to intolerance. She has been given instructions to change her tube feeding amount by various staff members however, there is no documentation that I can see.  I am not really clear on how much she is infusing. Weight decreased and was documented as 129.2 pounds down from 131.5 pounds June 28. Noted labs: Glucose 133 and albumin 3.3. Patient reports ongoing nausea.  She states she takes her nausea medication but complains of continuing nausea despite this. Reports she feels like she was drowning when she was doing continuous tube feeding of 80-90 mL an hour.  Estimated nutrition needs: 1900-2100 cal, 95-110 g protein, 2.0 L fluid.  Nutrition diagnosis: Inadequate oral intake continues.  Intervention: Provided patient with a sample case of [employer] peptide 1.5 to use in place of Osmolite 1.5.  She will begin continuous jejunostomy feedings at 75 mL an hour and run continuously over 24 hours.  If tolerated she will increase D[employer] to 85 mL an hour continuously over 24 hours.  4 cartons [employer] 1.5 peptide provides 2000 cal, 96 g protein and 1400 mL free water. Patient instructed to give 100 mL free water 6 times daily via jejunostomy tube. Also encouraged her to continue small sips of water or other liquids as tolerated.  Question if lidocaine could be helpful.  Will contact MD. I asked patient to please follow-up with me tomorrow and let me know how she is doing with the new formula. If tolerated, I will change tube feeding orders to reflect new formula. Written instructions given to patient.  Teach back method used.  Patient states understanding of tube feeding changes.  She agrees to contact me on Wednesday.  Monitoring, evaluation,  goals: Patient will tolerate tube feeding changes to meet greater than 90% estimated nutrition needs.  Next visit: I will touch base with patient tomorrow.  Next official follow-up is Monday, July 12 during infusion.  **Disclaimer: This note was dictated with voice recognition software. Similar sounding words can inadvertently be transcribed and this note may contain transcription errors which may not have been corrected upon publication of note.**   .

## 2020-04-09 NOTE — Patient Instructions (Signed)
[facility] Discharge Instructions for Patients Receiving Chemotherapy  Today you received the following chemotherapy agents: Paclitaxel and Carboplatin  To help prevent nausea and vomiting after your treatment, we encourage you to take your nausea medication as directed by your MD.   If you develop nausea and vomiting that is not controlled by your nausea medication, call the clinic.   BELOW ARE SYMPTOMS THAT SHOULD BE REPORTED IMMEDIATELY:  *FEVER GREATER THAN 100.5 F  *CHILLS WITH OR WITHOUT FEVER  NAUSEA AND VOMITING THAT IS NOT CONTROLLED WITH YOUR NAUSEA MEDICATION  *UNUSUAL SHORTNESS OF BREATH  *UNUSUAL BRUISING OR BLEEDING  TENDERNESS IN MOUTH AND THROAT WITH OR WITHOUT PRESENCE OF ULCERS  *URINARY PROBLEMS  *BOWEL PROBLEMS  UNUSUAL RASH Items with * indicate a potential emergency and should be followed up as soon as possible.  Feel free to call the clinic should you have any questions or concerns. The clinic phone number is [phone].  Please show the CHEMO ALERT CARD at check-in to the Emergency Department and triage nurse.   

## 2020-04-09 NOTE — Progress Notes (Signed)
G-tube dressing changed. Site cleaned with Chloraprep scrub. Dressed with 4x4 gauze, nonadherent pads, and hyperfix tape.

## 2020-04-10 ENCOUNTER — Ambulatory Visit
Admission: RE | Admit: 2020-04-10 | Discharge: 2020-04-10 | Disposition: A | Discharge status: Home / Self Care | Payer: Self-pay | Source: Ambulatory Visit | Attending: Radiation Oncology | Admitting: Radiation Oncology

## 2020-04-10 ENCOUNTER — Other Ambulatory Visit: Payer: Self-pay

## 2020-04-10 ENCOUNTER — Encounter: Payer: Self-pay | Admitting: Nutrition

## 2020-04-10 ENCOUNTER — Telehealth: Payer: Self-pay | Admitting: Hematology

## 2020-04-10 ENCOUNTER — Ambulatory Visit: Payer: Self-pay | Admitting: Nutrition

## 2020-04-10 ENCOUNTER — Ambulatory Visit: Payer: Self-pay

## 2020-04-10 MED ORDER — KATE FARMS PEPTIDE 1.5 PO LIQD: ORAL | 6 refills | Status: DC

## 2020-04-10 NOTE — Progress Notes (Unsigned)
Patient stopped by after radiation.  Reports tolerating D[employer] peptide 1.5 last night.  Reports she used 2 bottles with good success.  Reports she has had no GI side effects and is feeling encouraged.  I will discontinue Osmolite 1.5 and write new orders for [facility], total of 4 cartons daily, to provide 2000 cal, 96 g protein, and 1400 mL free water.  Patient educated to use jejunostomy feeding tube and run formula continuously she would require a goal rate of 95 mL over 21 hours to provide 100% estimated nutrition needs.

## 2020-04-10 NOTE — Telephone Encounter (Signed)
Scheduled per 7/6 los. Pt is aware of appt added on 7/8.

## 2020-04-11 ENCOUNTER — Inpatient Hospital Stay: Payer: Self-pay

## 2020-04-11 ENCOUNTER — Ambulatory Visit: Payer: Self-pay

## 2020-04-11 ENCOUNTER — Other Ambulatory Visit: Payer: Self-pay

## 2020-04-11 ENCOUNTER — Ambulatory Visit
Admission: RE | Admit: 2020-04-11 | Discharge: 2020-04-11 | Disposition: A | Discharge status: Home / Self Care | Payer: Self-pay | Source: Ambulatory Visit | Attending: Radiation Oncology | Admitting: Radiation Oncology

## 2020-04-11 VITALS — BP 110/71 | HR 88 | Temp 97.7°F | Resp 17

## 2020-04-11 DIAGNOSIS — C155 Malignant neoplasm of lower third of esophagus: Secondary | ICD-10-CM

## 2020-04-11 DIAGNOSIS — E860 Dehydration: Secondary | ICD-10-CM

## 2020-04-11 MED ORDER — SODIUM CHLORIDE 0.9 % IV SOLN
INTRAVENOUS | Status: AC
  Filled 2020-04-11: qty 250

## 2020-04-12 ENCOUNTER — Ambulatory Visit
Admission: RE | Admit: 2020-04-12 | Discharge: 2020-04-12 | Disposition: A | Discharge status: Home / Self Care | Payer: Self-pay | Source: Ambulatory Visit | Attending: Radiation Oncology | Admitting: Radiation Oncology

## 2020-04-12 ENCOUNTER — Other Ambulatory Visit: Payer: Self-pay

## 2020-04-12 ENCOUNTER — Ambulatory Visit: Payer: Self-pay

## 2020-04-13 ENCOUNTER — Ambulatory Visit: Payer: Self-pay

## 2020-04-14 ENCOUNTER — Ambulatory Visit: Payer: Self-pay

## 2020-04-15 ENCOUNTER — Other Ambulatory Visit: Payer: Self-pay

## 2020-04-15 ENCOUNTER — Inpatient Hospital Stay (HOSPITAL_BASED_OUTPATIENT_CLINIC_OR_DEPARTMENT_OTHER): Payer: Self-pay | Admitting: Medical

## 2020-04-15 ENCOUNTER — Encounter: Payer: Self-pay | Admitting: Medical

## 2020-04-15 ENCOUNTER — Inpatient Hospital Stay: Payer: Self-pay | Admitting: Nutrition

## 2020-04-15 ENCOUNTER — Ambulatory Visit: Payer: Self-pay | Admitting: Nurse Practitioner

## 2020-04-15 ENCOUNTER — Inpatient Hospital Stay: Payer: Self-pay

## 2020-04-15 ENCOUNTER — Ambulatory Visit
Admission: RE | Admit: 2020-04-15 | Discharge: 2020-04-15 | Disposition: A | Discharge status: Home / Self Care | Payer: Self-pay | Source: Ambulatory Visit | Attending: Radiation Oncology | Admitting: Radiation Oncology

## 2020-04-15 ENCOUNTER — Ambulatory Visit: Payer: Self-pay

## 2020-04-15 VITALS — BP 109/84 | HR 110 | Temp 98.4°F | Resp 20 | Wt 130.2 lb

## 2020-04-15 VITALS — HR 96

## 2020-04-15 DIAGNOSIS — C155 Malignant neoplasm of lower third of esophagus: Secondary | ICD-10-CM

## 2020-04-15 DIAGNOSIS — C154 Malignant neoplasm of middle third of esophagus: Secondary | ICD-10-CM

## 2020-04-15 DIAGNOSIS — E860 Dehydration: Secondary | ICD-10-CM

## 2020-04-15 LAB — VITAMIN B12: Vitamin B-12: 525 pg/mL (ref 180–914)

## 2020-04-15 LAB — CBC WITH DIFFERENTIAL (CANCER CENTER ONLY)
Abs Immature Granulocytes: 0.06 10*3/uL (ref 0.00–0.07)
Basophils Absolute: 0 10*3/uL (ref 0.0–0.1)
Basophils Relative: 0 %
Eosinophils Absolute: 0 10*3/uL (ref 0.0–0.5)
Eosinophils Relative: 1 %
HCT: 28.6 % — ABNORMAL LOW (ref 36.0–46.0)
Hemoglobin: 9.8 g/dL — ABNORMAL LOW (ref 12.0–15.0)
Immature Granulocytes: 1 %
Lymphocytes Relative: 11 %
Lymphs Abs: 0.6 10*3/uL — ABNORMAL LOW (ref 0.7–4.0)
MCH: 35 pg — ABNORMAL HIGH (ref 26.0–34.0)
MCHC: 34.3 g/dL (ref 30.0–36.0)
MCV: 102.1 fL — ABNORMAL HIGH (ref 80.0–100.0)
Monocytes Absolute: 0.8 10*3/uL (ref 0.1–1.0)
Monocytes Relative: 16 %
Neutro Abs: 3.6 10*3/uL (ref 1.7–7.7)
Neutrophils Relative %: 71 %
Platelet Count: 319 10*3/uL (ref 150–400)
RBC: 2.8 MIL/uL — ABNORMAL LOW (ref 3.87–5.11)
RDW: 12.9 % (ref 11.5–15.5)
WBC Count: 5.1 10*3/uL (ref 4.0–10.5)
nRBC: 0 % (ref 0.0–0.2)

## 2020-04-15 LAB — CMP (CANCER CENTER ONLY)
ALT: 9 U/L (ref 0–44)
AST: 11 U/L — ABNORMAL LOW (ref 15–41)
Albumin: 3.1 g/dL — ABNORMAL LOW (ref 3.5–5.0)
Alkaline Phosphatase: 71 U/L (ref 38–126)
Anion gap: 13 (ref 5–15)
BUN: 12 mg/dL (ref 6–20)
CO2: 22 mmol/L (ref 22–32)
Calcium: 9.6 mg/dL (ref 8.9–10.3)
Chloride: 102 mmol/L (ref 98–111)
Creatinine: 0.68 mg/dL (ref 0.44–1.00)
GFR, Est AFR Am: >60 mL/min (ref >60)
GFR, Estimated: >60 mL/min (ref >60)
Glucose, Bld: 110 mg/dL — ABNORMAL HIGH (ref 70–99)
Potassium: 3.9 mmol/L (ref 3.5–5.1)
Sodium: 137 mmol/L (ref 135–145)
Total Bilirubin: 0.4 mg/dL (ref 0.3–1.2)
Total Protein: 6.7 g/dL (ref 6.5–8.1)

## 2020-04-15 LAB — IRON AND TIBC
Iron: 43 ug/dL (ref 41–142)
Saturation Ratios: 18 % — ABNORMAL LOW (ref 21–57)
TIBC: 239 ug/dL (ref 236–444)
UIBC: 196 ug/dL (ref 120–384)

## 2020-04-15 LAB — FERRITIN: Ferritin: 417 ng/mL — ABNORMAL HIGH (ref 11–307)

## 2020-04-15 MED ORDER — SODIUM CHLORIDE 0.9 % IV SOLN
INTRAVENOUS | Status: DC
  Filled 2020-04-15: qty 250

## 2020-04-15 MED ORDER — FAMOTIDINE IN NACL 20-0.9 MG/50ML-% IV SOLN
20.0000 mg | Freq: Once | INTRAVENOUS | Status: AC
  Administered 2020-04-15: 20 mg via INTRAVENOUS

## 2020-04-15 MED ORDER — SODIUM CHLORIDE 0.9 % IV SOLN
218.2000 mg | Freq: Once | INTRAVENOUS | Status: AC
  Administered 2020-04-15: 220 mg via INTRAVENOUS
  Filled 2020-04-15: qty 22

## 2020-04-15 MED ORDER — DIPHENHYDRAMINE HCL 50 MG/ML IJ SOLN
50.0000 mg | Freq: Once | INTRAMUSCULAR | Status: AC
  Administered 2020-04-15: 50 mg via INTRAVENOUS

## 2020-04-15 MED ORDER — SODIUM CHLORIDE 0.9 % IV SOLN
50.0000 mg/m2 | Freq: Once | INTRAVENOUS | Status: AC
  Administered 2020-04-15: 84 mg via INTRAVENOUS
  Filled 2020-04-15: qty 14

## 2020-04-15 MED ORDER — DIPHENHYDRAMINE HCL 50 MG/ML IJ SOLN
INTRAMUSCULAR | Status: AC
  Filled 2020-04-15: qty 1

## 2020-04-15 MED ORDER — FOLIC ACID 1 MG PO TABS: 1.0000 mg | ORAL_TABLET | Freq: Every day | ORAL | 1 refills | Status: DC

## 2020-04-15 MED ORDER — SUCRALFATE 1 GM/10ML PO SUSP: 1.0000 g | Freq: Three times a day (TID) | ORAL | 0 refills | Status: DC

## 2020-04-15 MED ORDER — SODIUM CHLORIDE 0.9 % IV SOLN
10.0000 mg | Freq: Once | INTRAVENOUS | Status: AC
  Administered 2020-04-15: 10 mg via INTRAVENOUS
  Filled 2020-04-15: qty 10

## 2020-04-15 MED ORDER — SODIUM CHLORIDE 0.9 % IV SOLN
Freq: Once | INTRAVENOUS | Status: AC
  Filled 2020-04-15: qty 250

## 2020-04-15 MED ORDER — FAMOTIDINE IN NACL 20-0.9 MG/50ML-% IV SOLN
INTRAVENOUS | Status: AC
  Filled 2020-04-15: qty 50

## 2020-04-15 MED ORDER — PALONOSETRON HCL INJECTION 0.25 MG/5ML
0.2500 mg | Freq: Once | INTRAVENOUS | Status: AC
  Administered 2020-04-15: 0.25 mg via INTRAVENOUS

## 2020-04-15 MED ORDER — PALONOSETRON HCL INJECTION 0.25 MG/5ML
INTRAVENOUS | Status: AC
  Filled 2020-04-15: qty 5

## 2020-04-15 NOTE — Patient Instructions (Signed)
[facility] Discharge Instructions for Patients Receiving Chemotherapy  Today you received the following chemotherapy agents Taxol, Carboplatin  To help prevent nausea and vomiting after your treatment, we encourage you to take your nausea medication as directed  If you develop nausea and vomiting that is not controlled by your nausea medication, call the clinic.   BELOW ARE SYMPTOMS THAT SHOULD BE REPORTED IMMEDIATELY:  *FEVER GREATER THAN 100.5 F  *CHILLS WITH OR WITHOUT FEVER  NAUSEA AND VOMITING THAT IS NOT CONTROLLED WITH YOUR NAUSEA MEDICATION  *UNUSUAL SHORTNESS OF BREATH  *UNUSUAL BRUISING OR BLEEDING  TENDERNESS IN MOUTH AND THROAT WITH OR WITHOUT PRESENCE OF ULCERS  *URINARY PROBLEMS  *BOWEL PROBLEMS  UNUSUAL RASH Items with * indicate a potential emergency and should be followed up as soon as possible.  Feel free to call the clinic should you have any questions or concerns. The clinic phone number is [phone].  Please show the CHEMO ALERT CARD at check-in to the Emergency Department and triage nurse.   

## 2020-04-15 NOTE — Progress Notes (Signed)
OK to treat.  Natalie Morrison, MHS, PA-C 

## 2020-04-15 NOTE — Progress Notes (Signed)
Nutrition follow-up completed with patient during infusion for esophageal cancer. Last weight documented was 130.2 pounds on July 12. Patient reports she is tolerating 3 cartons of [facility] via jejunostomy tube daily.  She is running continuous feeds at 85 mL an hour.  This is approximately 11 hours. 3 cartons [employer] 1.5 peptide provides 1500 cal, 72 g protein, 1050 mL free water.  This is 79% calorie needs, 76% protein needs.  Patient denies problems with tube feeding tolerance. She is hesitant to increase rate or the amount of time on the pump in order to get up to the fourth and final bottle.  Estimated nutrition needs: 1900-2100 cal, 95-110 g protein, 2.0 L fluid.  Nutrition diagnosis: Inadequate oral intake continues.  Intervention: I have encouraged patient to increase Kate 1.5 peptide via J-tube gradually to goal rate of 95 mL an hour and run feedings for 21 hours.  This will provide 2000 cal, 96 g protein, and 1400 mL free water. Recommended patient increase free water flushes to 100 mL 6 times a day. Patient has received all supplies from [employer]. She has no other concerns today.  Monitoring, evaluation, goals: Patient will tolerate increased tube feeding to provide greater than 90% estimated nutrition needs.  Next visit: Monday, July 19 during infusion.  **Disclaimer: This note was dictated with voice recognition software. Similar sounding words can inadvertently be transcribed and this note may contain transcription errors which may not have been corrected upon publication of note.**

## 2020-04-15 NOTE — Patient Instructions (Signed)

## 2020-04-15 NOTE — Progress Notes (Signed)
[place]   [phone] [phone]   Clinic Follow up Note   Patient Care Team: Eulas Post, MD as PCP - General Jonnie Finner, RN as Oncology Nurse Navigator Truitt Merle, MD as Consulting Physician (Hematology) Michael Boston, MD as Consulting Physician (General Surgery) Kyung Rudd, MD as Consulting Physician (Radiation Oncology) Mauri Pole, MD as Consulting Physician (Gastroenterology) Jackelyn Knife, MD as Rounding Team (Internal Medicine) 04/15/2020  CHIEF COMPLAINT: F/u esophagus cancer. Natalie Morrison presents for consideration of concurrent chemoradiation. She will begin week #4 of 6 of radiation. She will receive cycle #1, day #29 of carboplatin and paclitaxel.  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 Biopsied. - Food in the middle third of the esophagus. Removal was successful.   02/08/2020 Initial Biopsy   Diagnosis 02/08/20 Esophagus, biopsy, mass - INVASIVE SQUAMOUS CELL CARCINOMA - SEE COMMENT Microscopic Comment Based on the biopsy, the carcinoma appears moderately differentiated. Dr. Silverio Decamp was paged on Feb 09, 2020. Dr. Jeannie Done reviewed the case and agrees with the above diagnosis.    02/11/2020 Cancer Staging   Staging form: Esophagus - Other Histologies, AJCC 8th Edition - Clinical stage from 02/11/2020: cTX, cN1, cM0 - Signed by Truitt Merle, MD on 02/14/2020   02/14/2020 Initial Diagnosis   Esophageal cancer ([place])   02/14/2020 Imaging   CT CAP w contrast    IMPRESSION: 1. Irregular wall thickening and an ill-defined mass involving the distal esophagus consistent with known esophageal cancer. 2. Mildly enlarged lymph nodes lateral to the gastroesophageal junction and in the gastrohepatic ligament, suspicious for metastatic disease. 3. No evidence of distant metastatic disease. 4. 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 5. Aortic Atherosclerosis (ICD10-I70.0).   03/18/2020 -  Chemotherapy   Concurrent chemoRT with weekly carboplatin and Taxol starting 03/18/20   03/18/2020 -  Radiation Therapy   Concurrent chemo RT with weekly CT by Dr Lisbeth Renshaw starting 03/18/20     CURRENT THERAPY: Concurrent chemoRT with weekly carboplatin and Taxol starting6/14/21  INTERVAL HISTORY: Ms. Rud is a 49 y.o. 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 She is managed by Dr. Burr Medico. She presents for consideration of concurrent chemoradiation. She will begin week #4 of 6 of radiation. She will receive cycle #1, day #29 of carboplatin and paclitaxel. She is able to swallow small volumes of liquids. She otherwise gets all of nutrition through her Peg-tube. She reports nausea, anorexia and fatigue. She requests a refill of folic acid. She denies using Carafate despite this being on her medication list.   MEDICAL HISTORY:  Past Medical History:  Diagnosis Date  . Alcohol abuse   . Anemia   . Anxiety   . Anxiety and depression   . Asthma   . Depression   . Hepatitis A    "when I was a kid"  . Hypertension     SURGICAL HISTORY: Past Surgical History:  Procedure Laterality Date  . BREAST LUMPECTOMY Right   .  GASTROSTOMY N/A 02/22/2020   Procedure: OPEN PLACEMENT JEJUNOSTOMY FEEDING TUBE;  Surgeon: Jesusita Oka, MD;  Location: [place];  Service: General;  Laterality: N/A;  . TONSILLECTOMY AND ADENOIDECTOMY Bilateral  over 30 years ago  . UPPER GASTROINTESTINAL ENDOSCOPY      I have reviewed the social history and family history with the patient and they are unchanged from previous note.  ALLERGIES:  is allergic to nitrofurantoin and sulfonamide derivatives.  MEDICATIONS:  Current Outpatient Medications  Medication Sig Dispense Refill  . acetaminophen (TYLENOL) 325 MG tablet Take 650 mg by mouth every 6 (six) hours as needed for mild pain or headache.    Marland Kitchen amLODipine (NORVASC) 10 MG tablet 1 tablet (10 mg total) by Per J Tube route daily. 30 tablet 0  . Cyanocobalamin 1000 MCG/15ML LIQD Take 15 mLs (1,000 mcg total) by mouth daily. 258 mL 1  . folic acid (FOLVITE) 1 MG tablet Place 1 tablet (1 mg total) into feeding tube daily. 30 tablet 1  . HYDROcodone-acetaminophen (HYCET) 7.5-325 mg/15 ml solution Take 5-10 mLs by mouth every 8 (eight) hours as needed for moderate pain or severe pain. Take 30 minutes before meal for severe pain 240 mL 0  . loratadine (CLARITIN) 10 MG tablet Take 1 tablet (10 mg total) by mouth daily. 30 tablet 2  . metoCLOPramide (REGLAN) 5 MG tablet Place 1 tablet (5 mg total) into feeding tube 3 (three) times daily. 30 tablet 1  . Nutritional Supplements (KATE Morrison PEPTIDE 1.5) LIQD Discontinue Osmolite 1.5.  Give Natalie Morrison peptide 1.5 at 95 mL an hour via jejunostomy feeding tube over 21 hours to provide 2000 cal, 96 g of protein and 1400 mL water.  Give 100 mL water flush 6 times daily.  This will meet 100% estimated nutrition needs. 1300 mL 6  . ondansetron (ZOFRAN ODT) 4 MG disintegrating tablet Take 1 tablet (4 mg total) by mouth every 8 (eight) hours as needed for nausea or vomiting. 20 tablet 0  . ondansetron (ZOFRAN) 8 MG tablet Take 1 tablet (8 mg total) by mouth 2 (two) times daily as needed for refractory nausea / vomiting. Start on day 3 after chemo. 30 tablet 1  . prochlorperazine (COMPAZINE) 10 MG tablet Take 1 tablet (10 mg total) by mouth every 6 (six) hours as needed  (Nausea or vomiting). 30 tablet 1  . simethicone (MYLICON) 40 NI/7.7OE drops Place 0.6 mLs (40 mg total) into feeding tube 4 (four) times daily as needed (bloating). 30 mL 0  . sucralfate (CARAFATE) 1 GM/10ML suspension Take 10 mLs (1 g total) by mouth 4 (four) times daily -  with meals and at bedtime. 420 mL 0  . thiamine 100 MG tablet 1 tablet (100 mg total) by Per J Tube route daily. 30 tablet 1   Current Facility-Administered Medications  Medication Dose Route Frequency Provider Last Rate Last Admin  . 0.9 %  sodium chloride infusion  500 mL Intravenous Once Nandigam, Venia Minks, MD      . Derrill Memo ON 04/18/2020] 0.9 %  sodium chloride infusion   Intravenous Continuous [place] Memmer, Lyndon Code., PA-C        Review of Systems  Constitutional: Positive for malaise/fatigue. Negative for chills, diaphoresis and fever.  HENT: Positive for sore throat.   Respiratory: Negative for cough, hemoptysis, sputum production, shortness of breath and wheezing.   Cardiovascular: Negative for chest pain, palpitations, claudication and leg swelling.  Gastrointestinal: Positive for nausea. Negative for constipation, diarrhea and vomiting.  Genitourinary: Negative for dysuria and urgency.  Musculoskeletal: Negative for back pain and neck pain.  Neurological: Negative for dizziness, tremors and weakness.  Psychiatric/Behavioral: Positive for depression.     PHYSICAL EXAMINATION: ECOG PERFORMANCE STATUS: 1-2  Vitals:   04/15/20 0857  BP: 109/84  Pulse: (!) 110  Resp: 20  Temp: 98.4 F (36.9 C)  SpO2: 100%   Filed Weights   04/15/20 0857  Weight: 130 lb 3.2 oz (59.1 kg)   Physical Exam Constitutional:      General: She is not in acute distress.    Appearance: She is not ill-appearing, toxic-appearing or diaphoretic.  HENT:     Head: Normocephalic and atraumatic.  Eyes:     General: No scleral icterus.       Right eye: No discharge.        Left eye: No discharge.     Conjunctiva/sclera: Conjunctivae  normal.  Cardiovascular:     Rate and Rhythm: Regular rhythm. Tachycardia present.     Heart sounds: No murmur heard.  No friction rub. No gallop.   Pulmonary:     Effort: Pulmonary effort is normal. No respiratory distress.     Breath sounds: Normal breath sounds. No wheezing, rhonchi or rales.  Abdominal:     General: Bowel sounds are normal. There is no distension.     Tenderness: There is no abdominal tenderness. There is no guarding.    Musculoskeletal:     Right lower leg: No edema.     Left lower leg: No edema.  Skin:    General: Skin is warm and dry.     Coloration: Skin is not jaundiced or pale.     Findings: No bruising, erythema or lesion.  Neurological:     Coordination: Coordination normal.     Gait: Gait normal.  Psychiatric:        Mood and Affect: Affect is labile.        Behavior: Behavior is cooperative.        Thought Content: Thought content normal.        Cognition and Memory: Cognition normal.        Judgment: Judgment normal.      LABORATORY DATA:  I have reviewed the data as listed CBC Latest Ref Rng & Units 04/15/2020 04/09/2020 04/01/2020  WBC 4.0 - 10.5 K/uL 5.1 7.8 6.2  Hemoglobin 12.0 - 15.0 g/dL 9.8(L) 10.6(L) 11.0(L)  Hematocrit 36 - 46 % 28.6(L) 32.2(L) 32.9(L)  Platelets 150 - 400 K/uL 319 349 311     CMP Latest Ref Rng & Units 04/15/2020 04/09/2020 04/01/2020  Glucose 70 - 99 mg/dL 110(H) 133(H) 125(H)  BUN 6 - 20 mg/dL 12 11 11   Creatinine 0.44 - 1.00 mg/dL 0.68 0.69 0.66  Sodium 135 - 145 mmol/L 137 139 142  Potassium 3.5 - 5.1 mmol/L 3.9 3.8 3.9  Chloride 98 - 111 mmol/L 102 101 105  CO2 22 - 32 mmol/L 22 23 21(L)  Calcium 8.9 - 10.3 mg/dL 9.6 10.2 10.0  Total Protein 6.5 - 8.1 g/dL 6.7 7.4 7.2  Total Bilirubin 0.3 - 1.2 mg/dL 0.4 0.5 0.4  Alkaline Phos 38 - 126 U/L 71 75 66  AST 15 - 41 U/L 11(L) 10(L) 9(L)  ALT 0 - 44 U/L 9 10 13       RADIOGRAPHIC STUDIES: I have personally reviewed the radiological images as listed and  agreed with the findings in the report. No results found.   ASSESSMENT & PLAN: Natalie [place]  Barkeris a 49 y.o.Caucasianfemalewith a history of Alcohol abuse, anxiety/depression HTN   1. Esophagealsquamous cell carcinoma,in midesophagus, cTxN1M0 -EGD showeda partially obstructing tumor, biopsy confirmedinvasive squamous cells carcinoma. Imaging indicates mildly enlarged LNs lateral to the GEJ, she has locally advanced disease. There is no evidence of distant metastasis. -I reviewed 03/01/20 PET scan independently and with the patient which shows known esophageal mass and hypermetabolic LNs in the paraesophageal, retrotracheal, and gastrohepatic areas, consistent with locally advanced disease. This was discussed with Natalie Simpson, PA in RT. -She previously consented toneoadjuvantchemotherapywith taxol and carboplatin concurrent with radiation starting 03/18/20. Goal of therapy is curative -She was referred to cardiothoracic surgery  2. Dysphagia, Odynophagia, Weight loss, Malnutrition, Dehydration -She has had mild Dysphagia, Odynophagia for 1 year but has worsened in the last 2-3 months with mid upper right chest pain 8-9/10 with eating and less pain otherwise.  -She has lost 15 pound in the last 6 weeks -Drinking Ensure and water, liquid Hycet, mostly liquid diet -She was hospitalized 5/18 - 5/25 for J tube placement, complicated by n/v  -followed by nutrition   3.Excessive alcohol usage and heavysmokinghistory,alcohol Cessation  -She has quit smoking 2 years ago and stopped using Helen 1 year ago.H/o alcohol use over many decades.  -she has been counseled to quit smoking and drinking alcohol completely  4. B12 Deficiency  -She has been on oral B12 but has not been able to swallow due to #1/#2 -B12 level 2136 on 5/13, no need for B12 inj currently, will monitor -she has macrocytosis, B12 level is 514 on 03/18/20 which is normal range but  dropped from 2183 in  02/2020. I recommended oral liquid B12  5. Social and Acupuncturist  -She is married with no children. She has in-laws in town. Currently unemployed since late 2019, she was working in a bank. Her husband is a bar tender -She currently does not have insurance, but is looking to get coverage.  -F/u wtihfinancial advocate to help apply for grants and coverage   Disposition Ms. Bleau appears stable. She completed week 4 chemoRT. She is tolerating treatment moderately well, mainly with fatigue.   She continues tube feeding.She is seeing Dory Peru today. She continues on reglan, zofran and compazine PRN. I sent a new prescription for sucralfate today.    CBC and CMP stable. Iron and B12 levels are adequate. She will get IVF with treatment today and on Thursday this week.   She will proceed with week 5 taxol and carboplatin today as planned. She will f/u next week before week 6 chemo.  All questions were answered. The patient knows to call the clinic with any problems, questions or concerns. No barriers to learning was detected.    Harle Stanford, PA-C 04/15/20

## 2020-04-16 ENCOUNTER — Encounter: Payer: Self-pay | Admitting: *Deleted

## 2020-04-16 ENCOUNTER — Ambulatory Visit
Admission: RE | Admit: 2020-04-16 | Discharge: 2020-04-16 | Disposition: A | Discharge status: Home / Self Care | Payer: Self-pay | Source: Ambulatory Visit | Attending: Radiation Oncology | Admitting: Radiation Oncology

## 2020-04-16 ENCOUNTER — Other Ambulatory Visit: Payer: Self-pay

## 2020-04-16 NOTE — Progress Notes (Signed)
[facility] Psychosocial Distress Screening Clinical Social Work  Clinical Social Work was referred by distress screening protocol.  The patient scored a 4 on the Psychosocial Distress Thermometer which indicates mild distress. Clinical Social Worker contacted patient by phone to assess for distress and other psychosocial needs.   CSW left patient a message offering support and encouraging patient to return call.  ONCBCN DISTRESS SCREENING 04/15/2020  Screening Type Initial Screening  Distress experienced in past week (1-10) 4  Emotional problem type Depression  Physical Problem type Nausea/vomiting;Loss of appetitie  Physician notified of physical symptoms Yes  Referral to clinical psychology No  Referral to clinical social work No  Referral to dietition No  Referral to financial advocate No  Referral to support programs No  Referral to palliative care No    Johnnye Lana, MSW, LCSW, OSW-C Clinical Social Worker [place] [phone]

## 2020-04-17 ENCOUNTER — Ambulatory Visit
Admission: RE | Admit: 2020-04-17 | Discharge: 2020-04-17 | Disposition: A | Discharge status: Home / Self Care | Payer: Self-pay | Source: Ambulatory Visit | Attending: Radiation Oncology | Admitting: Radiation Oncology

## 2020-04-17 ENCOUNTER — Other Ambulatory Visit: Payer: Self-pay

## 2020-04-18 ENCOUNTER — Inpatient Hospital Stay: Payer: Self-pay

## 2020-04-18 ENCOUNTER — Ambulatory Visit
Admission: RE | Admit: 2020-04-18 | Discharge: 2020-04-18 | Disposition: A | Discharge status: Home / Self Care | Payer: Self-pay | Source: Ambulatory Visit | Attending: Radiation Oncology | Admitting: Radiation Oncology

## 2020-04-18 ENCOUNTER — Other Ambulatory Visit: Payer: Self-pay

## 2020-04-18 ENCOUNTER — Ambulatory Visit (HOSPITAL_COMMUNITY)
Admission: RE | Admit: 2020-04-18 | Discharge: 2020-04-18 | Disposition: A | Discharge status: Home / Self Care | Payer: Self-pay | Source: Ambulatory Visit | Attending: Hematology | Admitting: Hematology

## 2020-04-18 ENCOUNTER — Other Ambulatory Visit: Payer: Self-pay | Admitting: Hematology

## 2020-04-18 VITALS — BP 115/86 | HR 90 | Temp 97.8°F | Resp 18

## 2020-04-18 DIAGNOSIS — X58XXXA Exposure to other specified factors, initial encounter: Secondary | ICD-10-CM | POA: Insufficient documentation

## 2020-04-18 DIAGNOSIS — T85528A Displacement of other gastrointestinal prosthetic devices, implants and grafts, initial encounter: Secondary | ICD-10-CM

## 2020-04-18 DIAGNOSIS — C155 Malignant neoplasm of lower third of esophagus: Secondary | ICD-10-CM

## 2020-04-18 DIAGNOSIS — E860 Dehydration: Secondary | ICD-10-CM

## 2020-04-18 HISTORY — PX: IR REPLC GASTRO/COLONIC TUBE PERCUT W/FLUORO: IMG2333

## 2020-04-18 MED ORDER — LIDOCAINE VISCOUS HCL 2 % MT SOLN
OROMUCOSAL | Status: AC
  Filled 2020-04-18: qty 15

## 2020-04-18 MED ORDER — SODIUM CHLORIDE 0.9 % IV SOLN
INTRAVENOUS | Status: DC
  Filled 2020-04-18 (×2): qty 250

## 2020-04-18 MED ORDER — IOHEXOL 300 MG/ML  SOLN
50.0000 mL | Freq: Once | INTRAMUSCULAR | Status: AC | PRN
  Administered 2020-04-18: 15 mL

## 2020-04-18 NOTE — Progress Notes (Signed)
Infusion nurse asked me to look at Natalie Morrison's feeding tube.  J tube partially dislodge.  All Sutures securing device have come out.  Dr. Burr Medico notified and examined.  IR contacted and orders placed for eval and manage.  Natalie Morrison to be in IR today prior to 1600.  Natalie Morrison and Natalie Morrison notified and verbalized understanding.

## 2020-04-18 NOTE — Procedures (Signed)
Pre procedural Dx: Inadvertent removal of feeding J tube. Post procedural Dx: Same  Successful fluoroscopic guided replacement of 18 Fr jejunostomy tube.   The feeding tube is ready for immediate use.  EBL: None Complications: None immediate.  Ronny Bacon, MD Pager #: [phone]

## 2020-04-18 NOTE — Patient Instructions (Signed)

## 2020-04-19 ENCOUNTER — Other Ambulatory Visit (HOSPITAL_COMMUNITY): Payer: Self-pay

## 2020-04-19 ENCOUNTER — Other Ambulatory Visit: Payer: Self-pay

## 2020-04-19 ENCOUNTER — Ambulatory Visit
Admission: RE | Admit: 2020-04-19 | Discharge: 2020-04-19 | Disposition: A | Discharge status: Home / Self Care | Payer: Self-pay | Source: Ambulatory Visit | Attending: Radiation Oncology | Admitting: Radiation Oncology

## 2020-04-19 NOTE — Progress Notes (Signed)
[place]   [phone] [phone]   Clinic Follow up Note   Patient Care Team: Eulas Post, MD as PCP - General Jonnie Finner, RN as Oncology Nurse Navigator Truitt Merle, MD as Consulting Physician (Hematology) Michael Boston, MD as Consulting Physician (General Surgery) Kyung Rudd, MD as Consulting Physician (Radiation Oncology) Mauri Pole, MD as Consulting Physician (Gastroenterology) Jackelyn Knife, MD as Rounding Team (Internal Medicine) Karie Mainland, RD as Dietitian (Nutrition)  Date of Service:  04/22/2020  CHIEF COMPLAINT: F/u of esophageal cancer  SUMMARY OF ONCOLOGIC HISTORY: Oncology History Overview Note  Cancer Staging Esophageal cancer [facility]) Staging form: Esophagus - Other Histologies, AJCC 8th Edition - Clinical stage from 02/11/2020: cTX, cN1, cM0 - Signed by Truitt Merle, MD on 02/14/2020    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 Biopsied. - Food in the middle third of the esophagus. Removal was successful.   02/08/2020 Initial Biopsy   Diagnosis 02/08/20 Esophagus, biopsy, mass - INVASIVE SQUAMOUS CELL CARCINOMA - SEE COMMENT Microscopic Comment Based on the biopsy, the carcinoma appears moderately differentiated. Dr. Silverio Decamp was paged on Feb 09, 2020. Dr. Jeannie Done reviewed the case and agrees with the above diagnosis.    02/11/2020 Cancer Staging   Staging form: Esophagus - Other Histologies, AJCC 8th Edition - Clinical stage from 02/11/2020: cTX, cN1, cM0 - Signed by Truitt Merle, MD on 02/14/2020   02/14/2020 Initial Diagnosis   Esophageal cancer ([place])   02/14/2020 Imaging   CT CAP w contrast  IMPRESSION: 1. Irregular wall thickening and an ill-defined mass involving the distal esophagus  consistent with known esophageal cancer. 2. Mildly enlarged lymph nodes lateral to the gastroesophageal junction and in the gastrohepatic ligament, suspicious for metastatic disease. 3. No evidence of distant metastatic disease. 4. 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 5. Aortic Atherosclerosis (ICD10-I70.0).   03/18/2020 - 04/22/2020 Chemotherapy   Concurrent chemoRT with weekly carboplatin and Taxol starting 03/18/20-04/22/20   03/18/2020 - 04/25/2020 Radiation Therapy   Concurrent chemo RT with weekly CT by Dr Lisbeth Renshaw starting 03/18/20-04/25/20      CURRENT THERAPY:  Concurrent chemoRT with weekly carboplatin and Taxol starting6/14/21  INTERVAL HISTORY:  Natalie Morrison is here for a follow up and treatment. She presents to the clinic with family member. She notes her feeding tube has been fine since it was replaced. She is tender at incision location, healing well.  She notes the day her tube was dislodged she tried to feed but had vomiting. They noted change of color and mixture at the end of bottle of Sucralfate so was concerned it went bad. They now have new bottle. She has been tube feeding 95 per hour over 16-18 hours. She has been flushing her tube twice a day. Her chest pain is mostly controlled with Tylenol BID and Hydrocodone once daily.    REVIEW OF SYSTEMS:   Constitutional: Denies fevers, chills or abnormal weight loss Eyes: Denies blurriness of vision Ears, nose, mouth, throat, and face: Denies mucositis or sore throat Respiratory: Denies cough, dyspnea or wheezes Cardiovascular: Denies palpitation, chest discomfort or lower extremity swelling Gastrointestinal:  Denies nausea, heartburn or change in bowel habits Skin: Denies abnormal skin rashes MSK: (+) Chest pain  Lymphatics: Denies new lymphadenopathy or easy bruising Neurological:Denies numbness, tingling  or new weaknesses Behavioral/Psych: Mood is stable, no new changes  All other  systems were reviewed with the patient and are negative.  MEDICAL HISTORY:  Past Medical History:  Diagnosis Date  . Alcohol abuse   . Anemia   . Anxiety   . Anxiety and depression   . Asthma   . Depression   . Hepatitis A    "when I was a kid"  . Hypertension     SURGICAL HISTORY: Past Surgical History:  Procedure Laterality Date  . BREAST LUMPECTOMY Right   . GASTROSTOMY N/A 02/22/2020   Procedure: OPEN PLACEMENT JEJUNOSTOMY FEEDING TUBE;  Surgeon: Jesusita Oka, MD;  Location: [place];  Service: General;  Laterality: N/A;  . IR Egan W/FLUORO  04/18/2020  . TONSILLECTOMY AND ADENOIDECTOMY Bilateral over 30 years ago  . UPPER GASTROINTESTINAL ENDOSCOPY      I have reviewed the social history and family history with the patient and they are unchanged from previous note.  ALLERGIES:  is allergic to nitrofurantoin and sulfonamide derivatives.  MEDICATIONS:  Current Outpatient Medications  Medication Sig Dispense Refill  . acetaminophen (TYLENOL) 325 MG tablet Take 650 mg by mouth every 6 (six) hours as needed for mild pain or headache.    Marland Kitchen amLODipine (NORVASC) 10 MG tablet 1 tablet (10 mg total) by Per J Tube route daily. 30 tablet 0  . Cyanocobalamin 1000 MCG/15ML LIQD Take 15 mLs (1,000 mcg total) by mouth daily. 854 mL 1  . folic acid (FOLVITE) 1 MG tablet Place 1 tablet (1 mg total) into feeding tube daily. 30 tablet 1  . HYDROcodone-acetaminophen (HYCET) 7.5-325 mg/15 ml solution Take 5-10 mLs by mouth every 8 (eight) hours as needed for moderate pain or severe pain. Take 30 minutes before meal for severe pain 240 mL 0  . loratadine (CLARITIN) 10 MG tablet Take 1 tablet (10 mg total) by mouth daily. 30 tablet 2  . metoCLOPramide (REGLAN) 5 MG tablet Place 1 tablet (5 mg total) into feeding tube 3 (three) times daily. 30 tablet 1  . Nutritional Supplements (KATE FARMS PEPTIDE 1.5) LIQD Discontinue Osmolite 1.5.  Give [employer] peptide 1.5 at 95  mL an hour via jejunostomy feeding tube over 21 hours to provide 2000 cal, 96 g of protein and 1400 mL water.  Give 100 mL water flush 6 times daily.  This will meet 100% estimated nutrition needs. 1300 mL 6  . ondansetron (ZOFRAN ODT) 4 MG disintegrating tablet Take 1 tablet (4 mg total) by mouth every 8 (eight) hours as needed for nausea or vomiting. 20 tablet 0  . ondansetron (ZOFRAN) 8 MG tablet Take 1 tablet (8 mg total) by mouth 2 (two) times daily as needed for refractory nausea / vomiting. Start on day 3 after chemo. 30 tablet 1  . prochlorperazine (COMPAZINE) 10 MG tablet Take 1 tablet (10 mg total) by mouth every 6 (six) hours as needed (Nausea or vomiting). 30 tablet 1  . simethicone (MYLICON) 40 OE/7.0JJ drops Place 0.6 mLs (40 mg total) into feeding tube 4 (four) times daily as needed (bloating). 30 mL 0  . sucralfate (CARAFATE) 1 GM/10ML suspension Take 10 mLs (1 g total) by mouth 4 (four) times daily -  with meals and at bedtime. 420 mL 0  . thiamine 100 MG tablet 1 tablet (100 mg total) by Per J Tube route daily. 30 tablet 1   Current Facility-Administered Medications  Medication Dose Route Frequency Provider Last Rate Last  Admin  . 0.9 %  sodium chloride infusion  500 mL Intravenous Once Nandigam, Kavitha V, MD        PHYSICAL EXAMINATION: ECOG PERFORMANCE STATUS: 2 - Symptomatic, <50% confined to bed  Vitals:   04/22/20 0959  BP: 113/89  Pulse: (!) 117  Resp: 18  Temp: 98.1 F (36.7 C)  SpO2: 99%   Filed Weights   04/22/20 0959  Weight: 125 lb 4.8 oz (56.8 kg)    GENERAL:alert, no distress and comfortable SKIN: skin color, texture, turgor are normal, no rashes or significant lesions EYES: normal, Conjunctiva are pink and non-injected, sclera clear  NECK: supple, thyroid normal size, non-tender, without nodularity LYMPH:  no palpable lymphadenopathy in the cervical, axillary  LUNGS: clear to auscultation and percussion with normal breathing effort HEART: regular  rate & rhythm and no murmurs and no lower extremity edema ABDOMEN:abdomen soft, non-tender and normal bowel sounds (+) Left abdominal feeding tube healing well, covered in Gauze, no discharge or skin erythema.  Musculoskeletal:no cyanosis of digits and no clubbing  NEURO: alert & oriented x 3 with fluent speech, no focal motor/sensory deficits  LABORATORY DATA:  I have reviewed the data as listed CBC Latest Ref Rng & Units 04/22/2020 04/15/2020 04/09/2020  WBC 4.0 - 10.5 K/uL 4.1 5.1 7.8  Hemoglobin 12.0 - 15.0 g/dL 10.6(L) 9.8(L) 10.6(L)  Hematocrit 36 - 46 % 31.7(L) 28.6(L) 32.2(L)  Platelets 150 - 400 K/uL 271 319 349     CMP Latest Ref Rng & Units 04/22/2020 04/15/2020 04/09/2020  Glucose 70 - 99 mg/dL 116(H) 110(H) 133(H)  BUN 6 - 20 mg/dL 11 12 11   Creatinine 0.44 - 1.00 mg/dL 0.67 0.68 0.69  Sodium 135 - 145 mmol/L 138 137 139  Potassium 3.5 - 5.1 mmol/L 3.8 3.9 3.8  Chloride 98 - 111 mmol/L 101 102 101  CO2 22 - 32 mmol/L 23 22 23   Calcium 8.9 - 10.3 mg/dL 9.9 9.6 10.2  Total Protein 6.5 - 8.1 g/dL 7.0 6.7 7.4  Total Bilirubin 0.3 - 1.2 mg/dL 0.4 0.4 0.5  Alkaline Phos 38 - 126 U/L 70 71 75  AST 15 - 41 U/L 9(L) 11(L) 10(L)  ALT 0 - 44 U/L 6 9 10       RADIOGRAPHIC STUDIES: I have personally reviewed the radiological images as listed and agreed with the findings in the report. No results found.   ASSESSMENT & PLAN:  MY RINKE is a 49 y.o. female with    1. Esophagealsquamous cell carcinoma,in midesophagus, cTxN1M0 -She was diagnosed in 02/2020.EGD showeda partially obstructing tumor, biopsy confirmedinvasive squamous cells carcinoma. Imaging indicates mildly enlarged LNs lateral to the GEJ, she has locally advanced disease. There is no evidence of distant metastasis. -Her5/28/21 PET scan showedknown esophageal mass and hypermetabolic LNs in the paraesophageal, retrotracheal, and gastrohepatic areas, consistent with locally advanced disease.  -Ipreviouslydiscussed although her cancer appears localized itis locallyadvancedwith obstruction and reginal node involvement. Given the positive nodes on CT, I do not think she needs EUS. -Ipreviouslydiscussed for locally advanced disease standard treatment includesneoadjuvant concurrent chemoradiation followed by esophagectomysurgery which is curative, but it is overall aggressive cancer, and has high risk of recurrence. -She started concurrent chemoRT with weeklycarboplatin and Taxol on 03/18/20. Plan to complete 04/25/20.  -I previously discussed the possibility of cure from chemoRT alone is only about 30-40%, surgery is the standard treatment to increase her chance of cure. She is very apprehensive about surgery, but is willing to proceed with consult after she  completes chemoRT. She agrees to call back [facility] to reschedule her appointment  -I discussed if she does not proceed with surgery, I would recommend 3 month of consolidation chemo with FOLFOX and additional one year adjuvant immunotherapy. We can also repeat EGD to see if she has any residual disease after chemoRT if that helps her to decide on surgery. She voiced good understanding.  -S/p week 5 she is mostly stable with mild weight loss. She is mostly stable and continues not eating by mouth. I encouraged her to watch for increased pain at end of RT.  -Labs reviewed and adequate to proceed with Last dose CT today. Continue RT until 7/22 -Will give IV Fluids on 7/21 or 7/22.  -F/u next week.    2. Dysphagia, Odynophagia, Weight loss, Malnutrition, Dehydration -She has had mild Dysphagia, Odynophagia for 1 year but has worsened in the last 2-3 months with mid upper right chest pain 8-9/10 with eating and less pain otherwise.  -She had lost 15 poundover6 weeks and reduced eating tojello, pudding, milkshakes. She takes liquid tylenol for pain.Prior to start of treatment she was 140 pounds, now 130-120. -She had J-Tube placement  as inpatient on 02/22/20. She will continue to f/u with dietician for management. -She has been feeding with pump during the day and night currently at 24ml per hour over 16-18 hours. She flushes her tube twice daily. She also has Ensure TID via gravity.She is not taking in any foods by mouth. Weight has been fluctuating.  -On 04/18/20 her feeding had become dislodged and she was able to have it replaced by IR same day.  -She is on Sucralfate TID by mouth and sublingual Zofran. She is taking OTC Protonix. Will continue.  -She is on Tylenol BID and Hydrocodone once a day. Pain overall controlled currently. I discussed her pain may increase this last week of RT and next week. She can increase Hydrocodone if needed.     3.Excessive alcohol usage and heavysmokinghistory,alcohol Cessation  -She has quit smoking 2 years ago and stopped using [place] 1 year ago. She rarely drinks alcohol now and stopped since her pain, but was drinking for 50 years.  -Ipreviouslydiscussed her history of long term smoking and alcohol use is related to this type of esophageal cancer. Ipreviouslyadvised her to quit drinking alcohol completely. Sheagreed.   4. B12 Deficiency  -She has been on oral B12 but has not been able to swallow due to #1/#2 -B12 level 2136 on 5/13, no need for B12 injectionscurrently, will monitor -She has macrocytosis, B32from 03/05/20 was 2183.  5. Social and Acupuncturist  -She is married with no children. She has in-laws in town -She is currently unemployed since late 2019, she was working in a bank. Her husband is a bar tender -She currently does not have insurance, but is looking to get coverage. -F/uwithfinancial advocate to help apply for grants and coverage   PLAN: -Labs reviewed and adequate to proceed with last dose chemo CT today  -Continue RT until 04/25/20  -2hr IVF on 7/21 or 7/22 -Lab, f/u  With me or APP and 2 Hr IVF later next week  -pt agreed  to call C[facility] to schedule her appointment with them    No problem-specific Assessment & Plan notes found for this encounter.   No orders of the defined types were placed in this encounter.  All questions were answered. The patient knows to call the clinic with any problems, questions or concerns. No barriers to learning  was detected. The total time spent in the appointment was 30 minutes.     Truitt Merle, MD 04/22/2020   I, Joslyn Devon, am acting as scribe for Truitt Merle, MD.   I have reviewed the above documentation for accuracy and completeness, and I agree with the above.

## 2020-04-22 ENCOUNTER — Ambulatory Visit
Admission: RE | Admit: 2020-04-22 | Discharge: 2020-04-22 | Disposition: A | Discharge status: Home / Self Care | Payer: Self-pay | Source: Ambulatory Visit | Attending: Radiation Oncology | Admitting: Radiation Oncology

## 2020-04-22 ENCOUNTER — Inpatient Hospital Stay (HOSPITAL_BASED_OUTPATIENT_CLINIC_OR_DEPARTMENT_OTHER): Payer: Self-pay | Admitting: Hematology

## 2020-04-22 ENCOUNTER — Other Ambulatory Visit: Payer: Self-pay

## 2020-04-22 ENCOUNTER — Inpatient Hospital Stay: Payer: Self-pay | Admitting: Nutrition

## 2020-04-22 ENCOUNTER — Inpatient Hospital Stay: Payer: Self-pay

## 2020-04-22 ENCOUNTER — Ambulatory Visit: Payer: Self-pay

## 2020-04-22 ENCOUNTER — Telehealth: Payer: Self-pay | Admitting: Hematology

## 2020-04-22 ENCOUNTER — Encounter: Payer: Self-pay | Admitting: Hematology

## 2020-04-22 VITALS — HR 102

## 2020-04-22 VITALS — BP 113/89 | HR 117 | Temp 98.1°F | Resp 18 | Ht 65.0 in | Wt 125.3 lb

## 2020-04-22 DIAGNOSIS — C155 Malignant neoplasm of lower third of esophagus: Secondary | ICD-10-CM

## 2020-04-22 DIAGNOSIS — C154 Malignant neoplasm of middle third of esophagus: Secondary | ICD-10-CM

## 2020-04-22 LAB — FERRITIN: Ferritin: 399 ng/mL — ABNORMAL HIGH (ref 11–307)

## 2020-04-22 LAB — CBC WITH DIFFERENTIAL (CANCER CENTER ONLY)
Abs Immature Granulocytes: 0.02 10*3/uL (ref 0.00–0.07)
Basophils Absolute: 0 10*3/uL (ref 0.0–0.1)
Basophils Relative: 0 %
Eosinophils Absolute: 0 10*3/uL (ref 0.0–0.5)
Eosinophils Relative: 1 %
HCT: 31.7 % — ABNORMAL LOW (ref 36.0–46.0)
Hemoglobin: 10.6 g/dL — ABNORMAL LOW (ref 12.0–15.0)
Immature Granulocytes: 1 %
Lymphocytes Relative: 9 %
Lymphs Abs: 0.4 10*3/uL — ABNORMAL LOW (ref 0.7–4.0)
MCH: 34.8 pg — ABNORMAL HIGH (ref 26.0–34.0)
MCHC: 33.4 g/dL (ref 30.0–36.0)
MCV: 103.9 fL — ABNORMAL HIGH (ref 80.0–100.0)
Monocytes Absolute: 1 10*3/uL (ref 0.1–1.0)
Monocytes Relative: 25 %
Neutro Abs: 2.6 10*3/uL (ref 1.7–7.7)
Neutrophils Relative %: 64 %
Platelet Count: 271 10*3/uL (ref 150–400)
RBC: 3.05 MIL/uL — ABNORMAL LOW (ref 3.87–5.11)
RDW: 13.8 % (ref 11.5–15.5)
WBC Count: 4.1 10*3/uL (ref 4.0–10.5)
nRBC: 0 % (ref 0.0–0.2)

## 2020-04-22 LAB — CMP (CANCER CENTER ONLY)
ALT: 6 U/L (ref 0–44)
AST: 9 U/L — ABNORMAL LOW (ref 15–41)
Albumin: 3.1 g/dL — ABNORMAL LOW (ref 3.5–5.0)
Alkaline Phosphatase: 70 U/L (ref 38–126)
Anion gap: 14 (ref 5–15)
BUN: 11 mg/dL (ref 6–20)
CO2: 23 mmol/L (ref 22–32)
Calcium: 9.9 mg/dL (ref 8.9–10.3)
Chloride: 101 mmol/L (ref 98–111)
Creatinine: 0.67 mg/dL (ref 0.44–1.00)
GFR, Est AFR Am: >60 mL/min (ref >60)
GFR, Estimated: >60 mL/min (ref >60)
Glucose, Bld: 116 mg/dL — ABNORMAL HIGH (ref 70–99)
Potassium: 3.8 mmol/L (ref 3.5–5.1)
Sodium: 138 mmol/L (ref 135–145)
Total Bilirubin: 0.4 mg/dL (ref 0.3–1.2)
Total Protein: 7 g/dL (ref 6.5–8.1)

## 2020-04-22 LAB — IRON AND TIBC
Iron: 37 ug/dL — ABNORMAL LOW (ref 41–142)
Saturation Ratios: 16 % — ABNORMAL LOW (ref 21–57)
TIBC: 225 ug/dL — ABNORMAL LOW (ref 236–444)
UIBC: 188 ug/dL (ref 120–384)

## 2020-04-22 LAB — VITAMIN B12: Vitamin B-12: 1430 pg/mL — ABNORMAL HIGH (ref 180–914)

## 2020-04-22 MED ORDER — FAMOTIDINE IN NACL 20-0.9 MG/50ML-% IV SOLN
INTRAVENOUS | Status: AC
  Filled 2020-04-22: qty 50

## 2020-04-22 MED ORDER — PALONOSETRON HCL INJECTION 0.25 MG/5ML
INTRAVENOUS | Status: AC
  Filled 2020-04-22: qty 5

## 2020-04-22 MED ORDER — DIPHENHYDRAMINE HCL 50 MG/ML IJ SOLN
INTRAMUSCULAR | Status: AC
  Filled 2020-04-22: qty 1

## 2020-04-22 MED ORDER — SODIUM CHLORIDE 0.9 % IV SOLN
Freq: Once | INTRAVENOUS | Status: AC
  Filled 2020-04-22: qty 250

## 2020-04-22 MED ORDER — PALONOSETRON HCL INJECTION 0.25 MG/5ML
0.2500 mg | Freq: Once | INTRAVENOUS | Status: AC
  Administered 2020-04-22: 0.25 mg via INTRAVENOUS

## 2020-04-22 MED ORDER — SODIUM CHLORIDE 0.9 % IV SOLN
50.0000 mg/m2 | Freq: Once | INTRAVENOUS | Status: AC
  Administered 2020-04-22: 84 mg via INTRAVENOUS
  Filled 2020-04-22: qty 14

## 2020-04-22 MED ORDER — SODIUM CHLORIDE 0.9 % IV SOLN
218.2000 mg | Freq: Once | INTRAVENOUS | Status: AC
  Administered 2020-04-22: 220 mg via INTRAVENOUS
  Filled 2020-04-22: qty 22

## 2020-04-22 MED ORDER — FAMOTIDINE IN NACL 20-0.9 MG/50ML-% IV SOLN
20.0000 mg | Freq: Once | INTRAVENOUS | Status: AC
  Administered 2020-04-22: 20 mg via INTRAVENOUS

## 2020-04-22 MED ORDER — DIPHENHYDRAMINE HCL 50 MG/ML IJ SOLN
50.0000 mg | Freq: Once | INTRAMUSCULAR | Status: AC
  Administered 2020-04-22: 50 mg via INTRAVENOUS

## 2020-04-22 MED ORDER — SODIUM CHLORIDE 0.9 % IV SOLN
10.0000 mg | Freq: Once | INTRAVENOUS | Status: AC
  Administered 2020-04-22: 10 mg via INTRAVENOUS
  Filled 2020-04-22: qty 10

## 2020-04-22 NOTE — Progress Notes (Signed)
Nutrition follow-up completed with patient during infusion for esophagus cancer. Weight was decreased to 125.3 pounds on July 19.  This is down from 130.2 pounds July 12. Noted feeding tube came out and was replaced in the same day. Patient reports she is using [employer] 1.5 peptide at 95 mL an hour for about 16 hours.  This is still approximately 3 cartons, 1 carton under goal rate.  Patient reports she is giving Ensure 3 times daily via gravity through feeding tube. (?) She is not eating by mouth.  She continues to take ice chips. Patient is ready for treatment to be completed.  Estimated nutrition needs: 1900-2100 cal, 95-110 g protein, 2.0 L fluid.  Nutrition diagnosis: Inadequate oral intake continues. Suboptimal enteral nutrition infusion related to infusion volume not reached as evidenced by inadequate enteral nutrition volume compared to estimated nutrition needs and weight loss.  Intervention: Again encouraged patient to try to increase time on continuous feedings to 21 hours daily to provide for 4 full cartons of [employer] 1.5 peptide which provides 2000 cal and 96 g protein thereby meeting 100% estimated nutrition needs.  I do not detect a knowledge deficit.  Patient has reported that she does not like staying on continuous feeding pump for long periods of time. I have encouraged patient to contact me with questions or concerns since her treatment is completed.  Monitoring, evaluation, goals: Patient will tolerate increased calories and protein to minimize weight loss and promote healing.  Next visit: To be scheduled as needed.  Patient has my contact information.  **Disclaimer: This note was dictated with voice recognition software. Similar sounding words can inadvertently be transcribed and this note may contain transcription errors which may not have been corrected upon publication of note.**

## 2020-04-22 NOTE — Progress Notes (Signed)
Pt's heart rate today is 102, ok to treat per Dr. Burr Medico.

## 2020-04-22 NOTE — Patient Instructions (Signed)
[facility] Discharge Instructions for Patients Receiving Chemotherapy  Today you received the following chemotherapy agents Taxol, Carboplatin  To help prevent nausea and vomiting after your treatment, we encourage you to take your nausea medication as directed  If you develop nausea and vomiting that is not controlled by your nausea medication, call the clinic.   BELOW ARE SYMPTOMS THAT SHOULD BE REPORTED IMMEDIATELY:  *FEVER GREATER THAN 100.5 F  *CHILLS WITH OR WITHOUT FEVER  NAUSEA AND VOMITING THAT IS NOT CONTROLLED WITH YOUR NAUSEA MEDICATION  *UNUSUAL SHORTNESS OF BREATH  *UNUSUAL BRUISING OR BLEEDING  TENDERNESS IN MOUTH AND THROAT WITH OR WITHOUT PRESENCE OF ULCERS  *URINARY PROBLEMS  *BOWEL PROBLEMS  UNUSUAL RASH Items with * indicate a potential emergency and should be followed up as soon as possible.  Feel free to call the clinic should you have any questions or concerns. The clinic phone number is [phone].  Please show the CHEMO ALERT CARD at check-in to the Emergency Department and triage nurse.   

## 2020-04-22 NOTE — Telephone Encounter (Signed)
Scheduled per 7/19 los. Pt is aware of appt time and date.

## 2020-04-23 ENCOUNTER — Ambulatory Visit
Admission: RE | Admit: 2020-04-23 | Discharge: 2020-04-23 | Disposition: A | Discharge status: Home / Self Care | Payer: Self-pay | Source: Ambulatory Visit | Attending: Radiation Oncology | Admitting: Radiation Oncology

## 2020-04-23 ENCOUNTER — Other Ambulatory Visit: Payer: Self-pay

## 2020-04-23 ENCOUNTER — Ambulatory Visit: Payer: Self-pay

## 2020-04-24 ENCOUNTER — Ambulatory Visit: Payer: Self-pay

## 2020-04-24 ENCOUNTER — Other Ambulatory Visit: Payer: Self-pay | Admitting: Hematology

## 2020-04-24 ENCOUNTER — Ambulatory Visit
Admission: RE | Admit: 2020-04-24 | Discharge: 2020-04-24 | Disposition: A | Discharge status: Home / Self Care | Payer: Self-pay | Source: Ambulatory Visit | Attending: Radiation Oncology | Admitting: Radiation Oncology

## 2020-04-24 ENCOUNTER — Other Ambulatory Visit: Payer: Self-pay

## 2020-04-24 ENCOUNTER — Inpatient Hospital Stay: Payer: Self-pay

## 2020-04-24 VITALS — BP 101/75 | HR 86 | Temp 98.1°F | Resp 18

## 2020-04-24 DIAGNOSIS — C155 Malignant neoplasm of lower third of esophagus: Secondary | ICD-10-CM

## 2020-04-24 DIAGNOSIS — E860 Dehydration: Secondary | ICD-10-CM

## 2020-04-24 MED ORDER — SODIUM CHLORIDE 0.9 % IV SOLN
INTRAVENOUS | Status: AC
  Filled 2020-04-24 (×2): qty 250

## 2020-04-24 NOTE — Patient Instructions (Signed)

## 2020-04-25 ENCOUNTER — Encounter (HOSPITAL_COMMUNITY): Payer: Self-pay

## 2020-04-25 ENCOUNTER — Other Ambulatory Visit: Payer: Self-pay | Admitting: Hematology

## 2020-04-25 ENCOUNTER — Ambulatory Visit
Admission: RE | Admit: 2020-04-25 | Discharge: 2020-04-25 | Disposition: A | Discharge status: Home / Self Care | Payer: Self-pay | Source: Ambulatory Visit | Attending: Radiation Oncology | Admitting: Radiation Oncology

## 2020-04-25 ENCOUNTER — Encounter: Payer: Self-pay | Admitting: Radiation Oncology

## 2020-04-25 ENCOUNTER — Other Ambulatory Visit: Payer: Self-pay

## 2020-04-25 ENCOUNTER — Ambulatory Visit: Payer: Self-pay

## 2020-04-25 DIAGNOSIS — C155 Malignant neoplasm of lower third of esophagus: Secondary | ICD-10-CM

## 2020-04-25 DIAGNOSIS — T85528A Displacement of other gastrointestinal prosthetic devices, implants and grafts, initial encounter: Secondary | ICD-10-CM

## 2020-04-25 HISTORY — PX: IR REPLC DUODEN/JEJUNO TUBE PERCUT W/FLUORO: IMG2334

## 2020-05-02 ENCOUNTER — Inpatient Hospital Stay (HOSPITAL_BASED_OUTPATIENT_CLINIC_OR_DEPARTMENT_OTHER): Payer: Self-pay | Admitting: Nurse Practitioner

## 2020-05-02 ENCOUNTER — Encounter: Payer: Self-pay | Admitting: Nurse Practitioner

## 2020-05-02 ENCOUNTER — Inpatient Hospital Stay: Payer: Self-pay

## 2020-05-02 ENCOUNTER — Other Ambulatory Visit: Payer: Self-pay

## 2020-05-02 VITALS — BP 115/72 | HR 88 | Temp 98.3°F | Resp 16

## 2020-05-02 DIAGNOSIS — C154 Malignant neoplasm of middle third of esophagus: Secondary | ICD-10-CM

## 2020-05-02 DIAGNOSIS — C155 Malignant neoplasm of lower third of esophagus: Secondary | ICD-10-CM

## 2020-05-02 LAB — CBC WITH DIFFERENTIAL (CANCER CENTER ONLY)
Abs Immature Granulocytes: 0.08 10*3/uL — ABNORMAL HIGH (ref 0.00–0.07)
Basophils Absolute: 0 10*3/uL (ref 0.0–0.1)
Basophils Relative: 1 %
Eosinophils Absolute: 0 10*3/uL (ref 0.0–0.5)
Eosinophils Relative: 0 %
HCT: 30.9 % — ABNORMAL LOW (ref 36.0–46.0)
Hemoglobin: 9.9 g/dL — ABNORMAL LOW (ref 12.0–15.0)
Immature Granulocytes: 2 %
Lymphocytes Relative: 12 %
Lymphs Abs: 0.5 10*3/uL — ABNORMAL LOW (ref 0.7–4.0)
MCH: 34.6 pg — ABNORMAL HIGH (ref 26.0–34.0)
MCHC: 32 g/dL (ref 30.0–36.0)
MCV: 108 fL — ABNORMAL HIGH (ref 80.0–100.0)
Monocytes Absolute: 1.2 10*3/uL — ABNORMAL HIGH (ref 0.1–1.0)
Monocytes Relative: 29 %
Neutro Abs: 2.3 10*3/uL (ref 1.7–7.7)
Neutrophils Relative %: 56 %
Platelet Count: 266 10*3/uL (ref 150–400)
RBC: 2.86 MIL/uL — ABNORMAL LOW (ref 3.87–5.11)
RDW: 15.2 % (ref 11.5–15.5)
WBC Count: 4.1 10*3/uL (ref 4.0–10.5)
nRBC: 0 % (ref 0.0–0.2)

## 2020-05-02 LAB — CMP (CANCER CENTER ONLY)
ALT: 7 U/L (ref 0–44)
AST: 8 U/L — ABNORMAL LOW (ref 15–41)
Albumin: 2.8 g/dL — ABNORMAL LOW (ref 3.5–5.0)
Alkaline Phosphatase: 91 U/L (ref 38–126)
Anion gap: 12 (ref 5–15)
BUN: 12 mg/dL (ref 6–20)
CO2: 23 mmol/L (ref 22–32)
Calcium: 9.8 mg/dL (ref 8.9–10.3)
Chloride: 101 mmol/L (ref 98–111)
Creatinine: 0.64 mg/dL (ref 0.44–1.00)
GFR, Est AFR Am: >60 mL/min (ref >60)
GFR, Estimated: >60 mL/min (ref >60)
Glucose, Bld: 105 mg/dL — ABNORMAL HIGH (ref 70–99)
Potassium: 4 mmol/L (ref 3.5–5.1)
Sodium: 136 mmol/L (ref 135–145)
Total Bilirubin: 0.3 mg/dL (ref 0.3–1.2)
Total Protein: 6.1 g/dL — ABNORMAL LOW (ref 6.5–8.1)

## 2020-05-02 LAB — IRON AND TIBC
Iron: 50 ug/dL (ref 41–142)
Saturation Ratios: 25 % (ref 21–57)
TIBC: 201 ug/dL — ABNORMAL LOW (ref 236–444)
UIBC: 151 ug/dL (ref 120–384)

## 2020-05-02 LAB — VITAMIN B12: Vitamin B-12: 2432 pg/mL — ABNORMAL HIGH (ref 180–914)

## 2020-05-02 LAB — FERRITIN: Ferritin: 362 ng/mL — ABNORMAL HIGH (ref 11–307)

## 2020-05-02 MED ORDER — SODIUM CHLORIDE 0.9 % IV SOLN
Freq: Once | INTRAVENOUS | Status: AC
  Filled 2020-05-02: qty 250

## 2020-05-02 NOTE — Progress Notes (Signed)
Pt. denies nausea/vomiting and states she doesn't need any antiemetics.

## 2020-05-02 NOTE — Progress Notes (Signed)
[facility]   [phone] [phone]   Clinic Follow up Note   Patient Care Team: Natalie Covey, MD as PCP - General Radonna Ricker, RN as Oncology Nurse Navigator Natalie Mood, MD as Consulting Physician (Hematology) Natalie Soda, MD as Consulting Physician (General Surgery) Natalie Puffer, MD as Consulting Physician (Radiation Oncology) Natalie Form, MD as Consulting Physician (Gastroenterology) Natalie Hopping, MD as Rounding Team (Internal Medicine) Natalie Morrison, RD as Dietitian (Nutrition) 05/02/2020  CHIEF COMPLAINT: Follow-up esophagus cancer  SUMMARY OF ONCOLOGIC HISTORY: Oncology History Overview Note  Cancer Staging Esophageal cancer [facility]) Staging Morrison: Esophagus - Other Histologies, AJCC 8th Edition - Clinical stage from 02/11/2020: cTX, cN1, cM0 - Signed by Natalie Mood, MD on 02/14/2020    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 Biopsied. - Food in the middle third of the esophagus. Removal was successful.   02/08/2020 Initial Biopsy   Diagnosis 02/08/20 Esophagus, biopsy, mass - INVASIVE SQUAMOUS CELL CARCINOMA - SEE COMMENT Microscopic Comment Based on the biopsy, the carcinoma appears moderately differentiated. Natalie Morrison was paged on Feb 09, 2020. Natalie Morrison reviewed the case and agrees with the above diagnosis.    02/11/2020 Cancer Staging   Staging Morrison: Esophagus - Other Histologies, AJCC 8th Edition - Clinical stage from 02/11/2020: cTX, cN1, cM0 - Signed by Natalie Mood, MD on 02/14/2020   02/14/2020 Initial Diagnosis   Esophageal cancer (HCC)   02/14/2020 Imaging   CT CAP w contrast  IMPRESSION: 1. Irregular wall thickening and an ill-defined mass involving the distal esophagus consistent with known  esophageal cancer. 2. Mildly enlarged lymph nodes lateral to the gastroesophageal junction and in the gastrohepatic ligament, suspicious for metastatic disease. 3. No evidence of distant metastatic disease. 4. 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 5. Aortic Atherosclerosis (ICD10-I70.0).   03/18/2020 - 04/22/2020 Chemotherapy   Concurrent chemoRT with weekly carboplatin and Taxol starting 03/18/20-04/22/20   03/18/2020 - 04/25/2020 Radiation Therapy   Concurrent chemo RT with weekly CT by Dr Natalie Morrison starting 03/18/20-04/25/20     CURRENT THERAPY:  Concurrent chemoRT with weekly carboplatin and Taxol 03/18/20 - 04/25/20; supportive care   INTERVAL HISTORY: [name] returns for follow-up as scheduled.  She completed chemoRT on 7/22.  She feels "like I got knocked out and kicked in the ribs."  She has tightness in the lower ribs and significant fatigue.  She has mild nausea without vomiting, Compazine and Zofran helps some.  Denies constipation or diarrhea.  She is able to drink apple juice without pain.  Still takes primary nutrition through her tube, takes 2-3 cans/day, goal is 6 per dietitian.  She has not eaten food by mouth yet.  Takes Tylenol periodically without much effect.  Denies recent fever, chills, cough, chest pain, dyspnea, neuropathy.  She is not sure if she wants to proceed with more surgery.  MEDICAL HISTORY:  Past Medical History:  Diagnosis Date  . Alcohol abuse   . Anemia   . Anxiety   . Anxiety and depression   . Asthma   . Depression   . Hepatitis A    "when I was a kid"  . Hypertension     SURGICAL HISTORY: Past Surgical History:  Procedure Laterality Date  . BREAST LUMPECTOMY Right   . GASTROSTOMY  N/A 02/22/2020   Procedure: OPEN PLACEMENT JEJUNOSTOMY FEEDING TUBE;  Surgeon: Jesusita Oka, MD;  Location: [place];  Service: General;  Laterality: N/A;  . IR [place] DUODEN/JEJUNO TUBE PERCUT W/FLUORO  04/25/2020  . TONSILLECTOMY AND  ADENOIDECTOMY Bilateral over 30 years ago  . UPPER GASTROINTESTINAL ENDOSCOPY      I have reviewed the social history and family history with the patient and they are unchanged from previous note.  ALLERGIES:  is allergic to nitrofurantoin and sulfonamide derivatives.  MEDICATIONS:  Current Outpatient Medications  Medication Sig Dispense Refill  . acetaminophen (TYLENOL) 325 MG tablet Take 650 mg by mouth every 6 (six) hours as needed for mild pain or headache.    . Cyanocobalamin 1000 MCG/15ML LIQD Take 15 mLs (1,000 mcg total) by mouth daily. 782 mL 1  . folic acid (FOLVITE) 1 MG tablet Place 1 tablet (1 mg total) into feeding tube daily. 30 tablet 1  . HYDROcodone-acetaminophen (HYCET) 7.5-325 mg/15 ml solution Take 5-10 mLs by mouth every 8 (eight) hours as needed for moderate pain or severe pain. Take 30 minutes before meal for severe pain 240 mL 0  . loratadine (CLARITIN) 10 MG tablet Take 1 tablet (10 mg total) by mouth daily. 30 tablet 2  . metoCLOPramide (REGLAN) 5 MG tablet Place 1 tablet (5 mg total) into feeding tube 3 (three) times daily. 30 tablet 1  . Nutritional Supplements (KATE Morrison PEPTIDE 1.5) LIQD Discontinue Osmolite 1.5.  Give Natalie Morrison peptide 1.5 at 95 mL an hour via jejunostomy feeding tube over 21 hours to provide 2000 cal, 96 g of protein and 1400 mL water.  Give 100 mL water flush 6 times daily.  This will meet 100% estimated nutrition needs. 1300 mL 6  . simethicone (MYLICON) 40 UM/3.5TI drops Place 0.6 mLs (40 mg total) into feeding tube 4 (four) times daily as needed (bloating). 30 mL 0  . sucralfate (CARAFATE) 1 GM/10ML suspension Take 10 mLs (1 g total) by mouth 4 (four) times daily -  with meals and at bedtime. 420 mL 0  . thiamine 100 MG tablet 1 tablet (100 mg total) by Per J Tube route daily. 30 tablet 1  . amLODipine (NORVASC) 10 MG tablet 1 tablet (10 mg total) by Per J Tube route daily. 30 tablet 0  . ondansetron (ZOFRAN ODT) 4 MG disintegrating tablet  Take 1 tablet (4 mg total) by mouth every 8 (eight) hours as needed for nausea or vomiting. 20 tablet 0  . ondansetron (ZOFRAN) 8 MG tablet TAKE 1 TABLET 2 TIMES DAILY AS NEEDED FOR REFRACTORY NAUSEA/VOMITING. START ON DAY 3 AFTER CHEMO 30 tablet 1  . prochlorperazine (COMPAZINE) 10 MG tablet TAKE 1 TABLET (10 MG TOTAL) BY MOUTH EVERY 6 (SIX) HOURS AS NEEDED (NAUSEA OR VOMITING). 30 tablet 1   Current Facility-Administered Medications  Medication Dose Route Frequency Provider Last Rate Last Admin  . 0.9 %  sodium chloride infusion  500 mL Intravenous Once Natalie Morrison, Natalie V, MD        PHYSICAL EXAMINATION: ECOG PERFORMANCE STATUS: 1-2  Vitals:   05/02/20 1327  BP: (!) 107/88  Pulse: 105  Resp: 18  Temp: (!) 97.5 F (36.4 C)  SpO2: 99%   Filed Weights   05/02/20 1327  Weight: 124 lb 1.6 oz (56.3 kg)    GENERAL:alert, no distress and comfortable SKIN: No rash to exposed skin.  Dry skin with mild erythema and hyperpigmentation to primary esophagus tumor and mid back EYES: sclera  clear LUNGS: clear with normal breathing effort HEART: regular rate & rhythm, no lower extremity edema ABDOMEN:abdomen soft, non-tender and normal bowel sounds.  Feeding tube in place, no erythema or drainage NEURO: alert & oriented x 3 with fluent speech   LABORATORY DATA:  I have reviewed the data as listed CBC Latest Ref Rng & Units 05/02/2020 04/22/2020 04/15/2020  WBC 4.0 - 10.5 K/uL 4.1 4.1 5.1  Hemoglobin 12.0 - 15.0 g/dL 9.9(L) 10.6(L) 9.8(L)  Hematocrit 36 - 46 % 30.9(L) 31.7(L) 28.6(L)  Platelets 150 - 400 K/uL 266 271 319     CMP Latest Ref Rng & Units 05/02/2020 04/22/2020 04/15/2020  Glucose 70 - 99 mg/dL 105(H) 116(H) 110(H)  BUN 6 - 20 mg/dL '12 11 12  '$ Creatinine 0.44 - 1.00 mg/dL 0.64 0.67 0.68  Sodium 135 - 145 mmol/L 136 138 137  Potassium 3.5 - 5.1 mmol/L 4.0 3.8 3.9  Chloride 98 - 111 mmol/L 101 101 102  CO2 22 - 32 mmol/L '23 23 22  '$ Calcium 8.9 - 10.3 mg/dL 9.8 9.9 9.6  Total  Protein 6.5 - 8.1 g/dL 6.1(L) 7.0 6.7  Total Bilirubin 0.3 - 1.2 mg/dL 0.3 0.4 0.4  Alkaline Phos 38 - 126 U/L 91 70 71  AST 15 - 41 U/L 8(L) 9(L) 11(L)  ALT 0 - 44 U/L '7 6 9      '$ RADIOGRAPHIC STUDIES: I have personally reviewed the radiological images as listed and agreed with the findings in the report. No results found.   ASSESSMENT & PLAN: Natalie Morrison a 49 y.o.Caucasianfemalewith a history of Alcohol abuse, anxiety/depression HTN   1. Esophagealsquamous cell carcinoma,in midesophagus, cTxN1M0 -EGD showeda partially obstructing tumor, biopsy confirmedinvasive squamous cells carcinoma. Imaging indicates mildly enlarged LNs lateral to the GEJ, she has locally advanced disease. There is no evidence of distant metastasis. -03/01/20 PET scan shows known esophageal mass and hypermetabolic LNs in the paraesophageal, retrotracheal, and gastrohepatic areas, consistent with locally advanced disease. -completed concurrent chemoRT with taxol and carboplatin 03/18/20 - 04/25/20, in the neoadjuvantsetting with curative intent.  Vernia Buff referred tocardiothoracic surgery but has not met them yet   2. Dysphagia, Odynophagia, Weight loss, Malnutrition, Dehydration -She has had mild Dysphagia, Odynophagia for 1 year but has worsened in the last 2-3 months before diagnosis; with mid upper right chest pain 8-9/10 with eating and less pain otherwise.  -She was hospitalized 5/18 - 5/25 for J tube placement, complicated by persistent n/Morrison -weight loss on treatment; followed by nutrition -since completing chemoRT she has started to tolerate liquids  3.Excessive alcohol usage and heavysmokinghistory,alcohol Cessation  -She has quit smoking 2 years ago and stopped using [place] 1 year ago.H/o alcohol use over many decades.  -she has been counseled to quit smoking and drinking alcohol completely  4. B12 Deficiency  -She has been on oral B12 but has not been able to swallow due  to #1/#2 -B12 level 2136 on 5/13, no need for B12 inj currently, will monitor -she has macrocytosis, B12 level is514 on 03/18/20 which is normal range but  dropped from 2183 in 02/2020. I recommended oral liquid B12  5. Social and Acupuncturist  -She is married with no children. She has in-laws in town. Currently unemployed since late 2019, she was working in a bank. Her husband is a bar tender -She currently does not have insurance, but is looking to get coverage.  -F/u wtihfinancial advocate to help apply for grants and coverage  Disposition:  Ms. Markwood appears stable.  She completed chemoRT with taxol/carboplatin on 7/22. She tolerated treatment except fatigue, nausea, and weight loss. She has esophagitis, but is beginning to advance po diet to liquids, still getting primary nutrition via J tube. I encouraged her to use carafate before meals and hycet PRN so she can tolerate more oral intake.    CBC and CMP are stable. BP is borderline. She will receive IV fluids today and continue twice weekly for now. She is not sure she wants to pursue surgical resection but is willing to talk to surgery, she will call to make an appointment. She is interested in repeat EGD to see if she has residual disease after chemoRT and PET scan in 2-3 months before making a final decision about surgery. If she does not proceed with surgery, we would offer her consolidation chemo.   F/u in 2 weeks.   All questions were answered. The patient knows to call the clinic with any problems, questions or concerns. No barriers to learning was detected.     Alla Feeling, NP 05/02/20

## 2020-05-02 NOTE — Patient Instructions (Signed)

## 2020-05-03 ENCOUNTER — Telehealth: Payer: Self-pay | Admitting: Nurse Practitioner

## 2020-05-03 NOTE — Telephone Encounter (Signed)
Scheduled appt per 7/29 los - called pt twice - no answer , they picked up and hung up . Unable to leave message. Sent message to RN to see if they could reach the patient

## 2020-05-07 ENCOUNTER — Inpatient Hospital Stay: Payer: Self-pay

## 2020-05-08 ENCOUNTER — Other Ambulatory Visit: Payer: Self-pay | Admitting: Hematology

## 2020-05-09 ENCOUNTER — Other Ambulatory Visit: Payer: Self-pay | Admitting: Medical

## 2020-05-09 ENCOUNTER — Inpatient Hospital Stay: Payer: Self-pay

## 2020-05-10 ENCOUNTER — Encounter: Payer: Self-pay | Admitting: Thoracic Surgery (Cardiothoracic Vascular Surgery)

## 2020-05-13 ENCOUNTER — Other Ambulatory Visit: Payer: Self-pay | Admitting: *Deleted

## 2020-05-13 ENCOUNTER — Ambulatory Visit
Admission: RE | Admit: 2020-05-13 | Discharge: 2020-05-13 | Disposition: A | Discharge status: Home / Self Care | Payer: Self-pay | Source: Ambulatory Visit | Attending: Thoracic Surgery (Cardiothoracic Vascular Surgery) | Admitting: Thoracic Surgery (Cardiothoracic Vascular Surgery)

## 2020-05-13 ENCOUNTER — Encounter: Payer: Self-pay | Admitting: Thoracic Surgery (Cardiothoracic Vascular Surgery)

## 2020-05-13 ENCOUNTER — Other Ambulatory Visit: Payer: Self-pay

## 2020-05-13 ENCOUNTER — Institutional Professional Consult (permissible substitution) (INDEPENDENT_AMBULATORY_CARE_PROVIDER_SITE_OTHER): Payer: Self-pay | Admitting: Thoracic Surgery (Cardiothoracic Vascular Surgery)

## 2020-05-13 ENCOUNTER — Inpatient Hospital Stay: Payer: Self-pay

## 2020-05-13 VITALS — BP 106/75 | HR 98 | Temp 97.1°F | Resp 16 | Ht 65.0 in | Wt 125.0 lb

## 2020-05-13 DIAGNOSIS — C159 Malignant neoplasm of esophagus, unspecified: Secondary | ICD-10-CM

## 2020-05-13 DIAGNOSIS — C155 Malignant neoplasm of lower third of esophagus: Secondary | ICD-10-CM

## 2020-05-13 NOTE — Progress Notes (Signed)
[address]       [place] [address]             [phone]                    [name] Allen Medical Record #[id number] Date of Birth: [birth year]  Referring: Truitt Merle, MD Primary Care: Eulas Post, MD Primary Cardiologist: No primary care provider on file.  Chief Complaint:    Chief Complaint  Patient presents with  . Esophageal Cancer    Surgical consult after completion of chemoradiation, PET Scan 03/01/20, C/A/P CT 02/14/20, Upper GI Endo 02/08/20    History of Present Illness:    Natalie Morrison 49 y.o. female referred for surgical evaluation of a mid esophageal squamous cell carcinoma.  She has a long history of alcohol abuse and was followed by gastroenterology for progressive dysphagia and odynophagia over the course of 1 year.  She underwent an upper endoscopy on 02/08/2020 which showed a mid esophageal mass that was partially obstructing.  Biopsies were consistent with squamous cell cancer.  On 02/10/2020 she was admitted to the hospital due to progressive weight loss, and subsequently underwent an open jejunostomy tube placement on 02/22/2020.  Since that point she has been on J-tube feeds.  She completed her neoadjuvant chemoradiation on 04/25/2020.  Over the course of her therapy she has lost a total of 15 pounds.  Prior to treatment she had also lost a total of 15 pounds.  She still is unable to take anything p.o. her J-tube was replaced on 1 occasion during the treatment course.     Smoking Hx: Former smoker   Zubrod Score: At the time of surgery this patient's most appropriate activity status/level should be described as: []     0    Normal activity, no symptoms [x]     1    Restricted in physical strenuous activity but ambulatory, able to do out light work []     2    Ambulatory and capable of self care, unable to do work activities, up and about               >50 % of waking hours                              []     3    Only limited  self care, in bed greater than 50% of waking hours []     4    Completely disabled, no self care, confined to bed or chair []     5    Moribund   Past Medical History:  Diagnosis Date  . Alcohol abuse   . Anemia   . Anxiety   . Anxiety and depression   . Asthma   . Depression   . Hepatitis A    "when I was a kid"  . Hypertension     Past Surgical History:  Procedure Laterality Date  . BREAST LUMPECTOMY Right   . GASTROSTOMY N/A 02/22/2020   Procedure: OPEN PLACEMENT JEJUNOSTOMY FEEDING TUBE;  Surgeon: Jesusita Oka, MD;  Location: [place];  Service: General;  Laterality: N/A;  . IR [place] DUODEN/JEJUNO TUBE PERCUT W/FLUORO  04/25/2020  . TONSILLECTOMY AND ADENOIDECTOMY Bilateral over 30 years ago  . UPPER GASTROINTESTINAL ENDOSCOPY      Family History  Problem Relation Age of Onset  . Heart disease Mother   .  Liver disease Mother   . Heart disease Father   . Cancer Maternal Grandmother        unknown type cancer   . Colon cancer Neg Hx   . Esophageal cancer Neg Hx   . Rectal cancer Neg Hx   . Stomach cancer Neg Hx      Social History   Tobacco Use  Smoking Status Former Smoker  . Packs/day: 1.00  . Years: 33.00  . Pack years: 33.00  . Types: Cigarettes  . Quit date: 02/01/2018  . Years since quitting: 2.2  Smokeless Tobacco Never Used    Social History   Substance and Sexual Activity  Alcohol Use Yes  . Alcohol/week: 2.0 - 3.0 standard drinks  . Types: 2 - 3 Shots of liquor per week   Comment: 2-3 drinks per day for 30 years, plan to stop      Allergies  Allergen Reactions  . Nitrofurantoin Hives  . Sulfonamide Derivatives Other (See Comments)    Unknown allergic reaction per husband     Current Outpatient Medications  Medication Sig Dispense Refill  . acetaminophen (TYLENOL) 325 MG tablet Take 650 mg by mouth every 6 (six) hours as needed for mild pain or headache.    Marland Kitchen amLODipine (NORVASC) 10 MG tablet 1 tablet (10 mg total) by Per J Tube route  daily. 30 tablet 0  . Cyanocobalamin 1000 MCG/15ML LIQD Take 15 mLs (1,000 mcg total) by mouth daily. 409 mL 1  . folic acid (FOLVITE) 1 MG tablet Place 1 tablet (1 mg total) into feeding tube daily. 30 tablet 1  . HYDROcodone-acetaminophen (HYCET) 7.5-325 mg/15 ml solution Take 5-10 mLs by mouth every 8 (eight) hours as needed for moderate pain or severe pain. Take 30 minutes before meal for severe pain 240 mL 0  . loratadine (CLARITIN) 10 MG tablet Take 1 tablet (10 mg total) by mouth daily. 30 tablet 2  . metoCLOPramide (REGLAN) 5 MG tablet Place 1 tablet (5 mg total) into feeding tube 3 (three) times daily. 30 tablet 1  . Nutritional Supplements (KATE FARMS PEPTIDE 1.5) LIQD Discontinue Osmolite 1.5.  Give [employer] peptide 1.5 at 95 mL an hour via jejunostomy feeding tube over 21 hours to provide 2000 cal, 96 g of protein and 1400 mL water.  Give 100 mL water flush 6 times daily.  This will meet 100% estimated nutrition needs. 1300 mL 6  . simethicone (MYLICON) 40 WJ/1.9JY drops Place 0.6 mLs (40 mg total) into feeding tube 4 (four) times daily as needed (bloating). 30 mL 0  . sucralfate (CARAFATE) 1 GM/10ML suspension Take 10 mLs (1 g total) by mouth 4 (four) times daily -  with meals and at bedtime. 420 mL 0  . thiamine 100 MG tablet 1 tablet (100 mg total) by Per J Tube route daily. 30 tablet 1   Current Facility-Administered Medications  Medication Dose Route Frequency Provider Last Rate Last Admin  . 0.9 %  sodium chloride infusion  500 mL Intravenous Once Nandigam, Venia Minks, MD        Review of Systems  Constitutional: Positive for malaise/fatigue and weight loss. Negative for fever.  Cardiovascular: Positive for chest pain.  Gastrointestinal: Positive for abdominal pain and heartburn.  Neurological: Negative.      PHYSICAL EXAMINATION: BP 106/75 (BP Location: Left Arm, Patient Position: Sitting, Cuff Size: Normal)   Pulse 98   Temp (!) 97.1 F (36.2 C)   Resp 16   Ht 5\' 5"   (  1.651 m)   Wt 125 lb (56.7 kg)   SpO2 99% Comment: RA  BMI 20.80 kg/m  Physical Exam Constitutional:      Appearance: She is ill-appearing.  HENT:     Head: Normocephalic and atraumatic.  Eyes:     Extraocular Movements: Extraocular movements intact.     Conjunctiva/sclera: Conjunctivae normal.  Cardiovascular:     Rate and Rhythm: Normal rate.  Pulmonary:     Effort: Pulmonary effort is normal. No respiratory distress.  Abdominal:     General: Abdomen is flat.       Comments: J-tube in place  Musculoskeletal:     Cervical back: Normal range of motion.  Neurological:     General: No focal deficit present.     Mental Status: She is alert and oriented to person, place, and time.     Diagnostic Studies & Laboratory data:     PET/CT: PET/CT from 03/01/2020 was reviewed.  There is a large mid esophageal lesion that is just distal to the carina.  It has an SUV of 15.9.  There is a right paraesophageal node with an SUV of 4.1, retrotracheal node with an SUV of 4.2.  There is a gastrohepatic ligament node with an SUV of 4.8.  The stomach is also massively dilated, which could be secondary to recent jejunostomy tube placement.  EGD/EUS: A large, fungating and ulcerating mass with bleeding and stigmata of recent bleeding was found in the middle third of the esophagus, 28 cm from the incisors. The mass was partially obstructing, unable to extend scope beyond the lesion to determine the extent. Biopsies were taken with a cold forceps for histology.   Path: Squamous cell cancer. Radiation Hx: Completed radiation on 04/25/2020.     I have independently reviewed the above radiology studies  and reviewed the findings with the patient.   Recent Lab Findings: Lab Results  Component Value Date   WBC 4.1 05/02/2020   HGB 9.9 (L) 05/02/2020   HCT 30.9 (L) 05/02/2020   PLT 266 05/02/2020   GLUCOSE 105 (H) 05/02/2020   CHOL 169 02/25/2018   TRIG 202 (H) 02/25/2018   HDL 25 (L)  02/25/2018   LDLDIRECT 183.0 09/05/2012   LDLCALC 104 (H) 02/25/2018   ALT 7 05/02/2020   AST 8 (L) 05/02/2020   NA 136 05/02/2020   K 4.0 05/02/2020   CL 101 05/02/2020   CREATININE 0.64 05/02/2020   BUN 12 05/02/2020   CO2 23 05/02/2020   TSH 3.742 02/13/2018   INR 1.16 02/22/2018   HGBA1C 5.2 02/23/2020   Problem list: Stage IIIa squamous cell cancer of the mid esophagus at least. Cancer is located 28 cm from the incisors, which is close to the carina. Protein malnutrition with an albumin of 2.8, and 30 pound weight loss. Status post open jejunostomy tube placement. History of alcohol abuse. Massively dilated stomach on PET scan from 03/01/2020.   Assessment / Plan:   49 year old female with a TX N1 M0 squamous cell cancer of the mid esophagus at 28 cm from the incisors.  Based off of its original presentation with bleeding stigmata it is at least a T3 lesion.    -In regards to the location I think it is prudent that she undergo bronchoscopy to ensure that there is no involvement of the trachea.  It does appear distal to the carina on the PET scan but this will be required at the time of surgery.  If she is a surgical  candidate she will require a cervical anastomosis.  Even though she has had an open jejunostomy this is a small incision thus she likely would still be a candidate for robotically assisted [place]  esophagectomy.  -Additionally her stomach is massively dilated on the PET scan from 03/01/2020.  She does complain of upper abdominal crampy pain with tube feeds, this is possible that the original jejunostomy may have been partially obstructing.  If her stomach remains at this dilated currently it likely will not service at ideal conduit for reconstruction.  Other options include: Jejunum which would complicate the procedure significantly and the patient stated that she would not want to undergo a colonic interposition graft.  I have ordered a repeat abdominal x-ray to assess  her stomach dilation.  She is scheduled to undergo another PET scan at 4 to 6 weeks after completion of her radiation which will put her into the week of August 23.  Cross-sectional imaging will be helpful in determining the size of her stomach.  -She remains quite malnourished, and has lost 30 pounds over the last year 64 of which occurred during her neoadjuvant therapy.  With a prealbumin of 2.8 this would put her at very high risk of complications including anastomotic leak.  Additionally with a dilated stomach this would also complicate her healing.  I  spent 55 minutes with  the patient face to face and greater then 50% of the time was spent in counseling and coordination of care.    Lajuana Matte 05/13/2020 1:59 PM

## 2020-05-14 ENCOUNTER — Inpatient Hospital Stay: Payer: Self-pay | Attending: Hematology

## 2020-05-14 ENCOUNTER — Other Ambulatory Visit: Payer: Self-pay

## 2020-05-14 VITALS — BP 99/79 | HR 97 | Temp 97.7°F | Resp 18

## 2020-05-14 DIAGNOSIS — C154 Malignant neoplasm of middle third of esophagus: Secondary | ICD-10-CM | POA: Insufficient documentation

## 2020-05-14 DIAGNOSIS — D7589 Other specified diseases of blood and blood-forming organs: Secondary | ICD-10-CM | POA: Insufficient documentation

## 2020-05-14 DIAGNOSIS — C155 Malignant neoplasm of lower third of esophagus: Secondary | ICD-10-CM

## 2020-05-14 DIAGNOSIS — E860 Dehydration: Secondary | ICD-10-CM | POA: Insufficient documentation

## 2020-05-14 MED ORDER — SODIUM CHLORIDE 0.9 % IV SOLN: Freq: Once | INTRAVENOUS | Status: DC

## 2020-05-14 MED ORDER — SODIUM CHLORIDE 0.9% FLUSH
10.0000 mL | Freq: Once | INTRAVENOUS | Status: AC | PRN
  Filled 2020-05-14: qty 10

## 2020-05-14 MED ORDER — ONDANSETRON HCL 4 MG/2ML IJ SOLN: 8.0000 mg | Freq: Once | INTRAMUSCULAR | Status: AC

## 2020-05-14 MED ORDER — PROMETHAZINE HCL 25 MG/ML IJ SOLN: 25.0000 mg | Freq: Once | INTRAMUSCULAR | Status: AC

## 2020-05-14 MED ORDER — SODIUM CHLORIDE 0.9 % IV SOLN
Freq: Once | INTRAVENOUS | Status: AC
  Filled 2020-05-14: qty 250

## 2020-05-14 NOTE — Progress Notes (Signed)
[place]   [phone] Fax:([phone]) [phone]   Clinic Follow up Note   Patient Care Team: Eulas Post, MD as PCP - General Jonnie Finner, RN as Oncology Nurse Navigator Truitt Merle, MD as Consulting Physician (Hematology) Michael Boston, MD as Consulting Physician (General Surgery) Kyung Rudd, MD as Consulting Physician (Radiation Oncology) Mauri Pole, MD as Consulting Physician (Gastroenterology) Jackelyn Knife, MD as Rounding Team (Internal Medicine) Karie Mainland, RD as Dietitian (Nutrition) 05/15/2020  CHIEF COMPLAINT: Follow-up esophagus cancer  SUMMARY OF ONCOLOGIC HISTORY: Oncology History Overview Note  Cancer Staging Esophageal cancer [facility]) Staging form: Esophagus - Other Histologies, AJCC 8th Edition - Clinical stage from 02/11/2020: cTX, cN1, cM0 - Signed by Truitt Merle, MD on 02/14/2020    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 Biopsied. - Food in the middle third of the esophagus. Removal was successful.   02/08/2020 Initial Biopsy   Diagnosis 02/08/20 Esophagus, biopsy, mass - INVASIVE SQUAMOUS CELL CARCINOMA - SEE COMMENT Microscopic Comment Based on the biopsy, the carcinoma appears moderately differentiated. Dr. Silverio Decamp was paged on Feb 09, 2020. Dr. Jeannie Done reviewed the case and agrees with the above diagnosis.    02/11/2020 Cancer Staging   Staging form: Esophagus - Other Histologies, AJCC 8th Edition - Clinical stage from 02/11/2020: cTX, cN1, cM0 - Signed by Truitt Merle, MD on 02/14/2020   02/14/2020 Initial Diagnosis   Esophageal cancer ([place])   02/14/2020 Imaging   CT CAP w contrast  IMPRESSION: 1. Irregular wall thickening and an ill-defined mass involving the distal esophagus consistent with known  esophageal cancer. 2. Mildly enlarged lymph nodes lateral to the gastroesophageal junction and in the gastrohepatic ligament, suspicious for metastatic disease. 3. No evidence of distant metastatic disease. 4. 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 5. Aortic Atherosclerosis (ICD10-I70.0).   03/18/2020 - 04/22/2020 Chemotherapy   Concurrent chemoRT with weekly carboplatin and Taxol starting 03/18/20-04/22/20   03/18/2020 - 04/25/2020 Radiation Therapy   Concurrent chemo RT with weekly CT by Dr Lisbeth Renshaw starting 03/18/20-04/25/20     CURRENT THERAPY:  Concurrent chemoRT with weekly carboplatin and Taxol 03/18/20 - 04/25/20; supportive care   INTERVAL HISTORY: Ms. Jerline Pain returns for follow-up as scheduled.  She was last seen by me on 05/02/2020, has been receiving intermittent IVF supportive care in the interim.  Energy is slightly improved.  She was seen in surgical consult on 8/9 by Dr. Kipp Brood.  The surgery sounds "major," and would like a second opinion.  She continues to have intermittent cramping upper abdominal pain she rates a 4/10 today.  She ran out of Hycet which was not refilled, has not taken any in about 2 weeks.  Tylenol helps somewhat.  Has intermittent nausea, slightly improved.  Bowels moving normally.  She is still taking all nutrition per tube around 18 hours/day.  She thinks that she takes 20 "milligrams" of water 3 times daily.  Her husband changes J tube dressing after she showers, last changed 3-4 days ago.  The site is draining and slightly more tender.  No fever or chills. Denies fever, chills, cough, chest pain, dyspnea, bleeding.   MEDICAL HISTORY:  Past Medical History:  Diagnosis Date  . Alcohol abuse   . Anemia   . Anxiety   . Anxiety and depression   .  Asthma   . Depression   . Hepatitis A    "when I was a kid"  . Hypertension     SURGICAL HISTORY: Past Surgical History:  Procedure Laterality Date  . BREAST LUMPECTOMY Right     . GASTROSTOMY N/A 02/22/2020   Procedure: OPEN PLACEMENT JEJUNOSTOMY FEEDING TUBE;  Surgeon: Jesusita Oka, MD;  Location: [place];  Service: General;  Laterality: N/A;  . IR [place] DUODEN/JEJUNO TUBE PERCUT W/FLUORO  04/25/2020  . TONSILLECTOMY AND ADENOIDECTOMY Bilateral over 30 years ago  . UPPER GASTROINTESTINAL ENDOSCOPY      I have reviewed the social history and family history with the patient and they are unchanged from previous note.  ALLERGIES:  is allergic to nitrofurantoin and sulfonamide derivatives.  MEDICATIONS:  Current Outpatient Medications  Medication Sig Dispense Refill  . amLODipine (NORVASC) 10 MG tablet 1 tablet (10 mg total) by Per J Tube route daily. 30 tablet 0  . Cyanocobalamin 1000 MCG/15ML LIQD Take 15 mLs (1,000 mcg total) by mouth daily. 323 mL 1  . folic acid (FOLVITE) 1 MG tablet Place 1 tablet (1 mg total) into feeding tube daily. 30 tablet 1  . HYDROcodone-acetaminophen (HYCET) 7.5-325 mg/15 ml solution Take 5-10 mLs by mouth every 8 (eight) hours as needed for moderate pain or severe pain. Take 30 minutes before meal for severe pain 240 mL 0  . acetaminophen (TYLENOL) 325 MG tablet Take 650 mg by mouth every 6 (six) hours as needed for mild pain or headache.    . dicyclomine (BENTYL) 10 MG/5ML solution Take 5 mLs (10 mg total) by mouth 4 (four) times daily -  before meals and at bedtime. 473 mL 1  . doxycycline (VIBRAMYCIN) 50 MG/5ML SYRP Take 10 mLs (100 mg total) by mouth 2 (two) times daily for 7 days. 200 mL 0  . loratadine (CLARITIN) 10 MG tablet Take 1 tablet (10 mg total) by mouth daily. 30 tablet 2  . metoCLOPramide (REGLAN) 5 MG tablet Place 1 tablet (5 mg total) into feeding tube 3 (three) times daily. 30 tablet 1  . Nutritional Supplements ([employer] PEPTIDE 1.5) LIQD Discontinue Osmolite 1.5.  Give [employer] peptide 1.5 at 95 mL an hour via jejunostomy feeding tube over 21 hours to provide 2000 cal, 96 g of protein and 1400 mL water.  Give 100  mL water flush 6 times daily.  This will meet 100% estimated nutrition needs. 1300 mL 6  . simethicone (MYLICON) 40 FT/7.3UK drops Place 0.6 mLs (40 mg total) into feeding tube 4 (four) times daily as needed (bloating). 30 mL 0  . sucralfate (CARAFATE) 1 GM/10ML suspension Take 10 mLs (1 g total) by mouth 4 (four) times daily -  with meals and at bedtime. 420 mL 0  . thiamine 100 MG tablet 1 tablet (100 mg total) by Per J Tube route daily. 30 tablet 1   Current Facility-Administered Medications  Medication Dose Route Frequency Provider Last Rate Last Admin  . 0.9 %  sodium chloride infusion  500 mL Intravenous Once Nandigam, Venia Minks, MD       Facility-Administered Medications Ordered in Other Visits  Medication Dose Route Frequency Provider Last Rate Last Admin  . 0.9 %  sodium chloride infusion   Intravenous Once Cira Rue K, NP      . ondansetron [facility]) 8 mg in sodium chloride 0.9 % 50 mL IVPB   Intravenous Once Alla Feeling, NP      . ondansetron [facility])  injection 8 mg  8 mg Intravenous Once Cira Rue K, NP      . promethazine (PHENERGAN) injection 25 mg  25 mg Intravenous Once Cira Rue K, NP      . sodium chloride flush (NS) 0.9 % injection 10 mL  10 mL Intracatheter Once PRN Alla Feeling, NP        PHYSICAL EXAMINATION: ECOG PERFORMANCE STATUS: 1-2  Vitals:   05/15/20 1321 05/15/20 1325  BP: (!) 87/68 99/84  Pulse: (!) 103   Resp: 18   Temp: (!) 97.2 F (36.2 C)   SpO2: 100%    Filed Weights   05/15/20 1321  Weight: 125 lb 4.8 oz (56.8 kg)    GENERAL:alert, no distress and comfortable SKIN: No rash to exposed skin.  Mild hyperpigmentation with dryness to thoracic area EYES: sclera clear NECK: Without mass LUNGS: clear with normal breathing effort HEART: Tachycardic, regular rhythm, no lower extremity edema ABDOMEN:abdomen soft, non-tender and normal bowel sounds.  Left J-tube in place with mild erythema and yellow/brown drainage NEURO: alert &  oriented x 3 with fluent speech  LABORATORY DATA:  I have reviewed the data as listed CBC Latest Ref Rng & Units 05/15/2020 05/02/2020 04/22/2020  WBC 4.0 - 10.5 K/uL 6.7 4.1 4.1  Hemoglobin 12.0 - 15.0 g/dL 9.9(L) 9.9(L) 10.6(L)  Hematocrit 36 - 46 % 30.0(L) 30.9(L) 31.7(L)  Platelets 150 - 400 K/uL 449(H) 266 271     CMP Latest Ref Rng & Units 05/15/2020 05/02/2020 04/22/2020  Glucose 70 - 99 mg/dL 92 105(H) 116(H)  BUN 6 - 20 mg/dL 11 12 11   Creatinine 0.44 - 1.00 mg/dL 0.54 0.64 0.67  Sodium 135 - 145 mmol/L 136 136 138  Potassium 3.5 - 5.1 mmol/L 3.8 4.0 3.8  Chloride 98 - 111 mmol/L 106 101 101  CO2 22 - 32 mmol/L 21(L) 23 23  Calcium 8.9 - 10.3 mg/dL 9.0 9.8 9.9  Total Protein 6.5 - 8.1 g/dL 5.4(L) 6.1(L) 7.0  Total Bilirubin 0.3 - 1.2 mg/dL 0.3 0.3 0.4  Alkaline Phos 38 - 126 U/L 118 91 70  AST 15 - 41 U/L 12(L) 8(L) 9(L)  ALT 0 - 44 U/L 8 7 6       RADIOGRAPHIC STUDIES: I have personally reviewed the radiological images as listed and agreed with the findings in the report. No results found.   ASSESSMENT & PLAN: TRUDE CANSLER a 49 y.o.Caucasianfemalewith a history of Alcohol abuse, anxiety/depression HTN   1. Esophagealsquamous cell carcinoma,in midesophagus, cTxN1M0 -EGD showeda partially obstructing tumor, biopsy confirmedinvasive squamous cells carcinoma. Imaging indicates mildly enlarged LNs lateral to the GEJ, she has locally advanced disease. There is no evidence of distant metastasis. -03/01/20 PET scan shows known esophageal mass and hypermetabolic LNs in the paraesophageal, retrotracheal, and gastrohepatic areas, consistent with locally advanced disease. -completed concurrent chemoRT with taxol and carboplatin 03/18/20 - 04/25/20, in the neoadjuvantsetting with curative intent.  -She was seen by Dr. Kipp Brood in cardiothoracic surgery on 05/13/2020, he felt she is likely resectable pending further work-up with PET scan and improved nutrition, next  follow-up 8/27 -slowly recovering from chemoRT, continues supportive care with IVF  2. Dysphagia, Odynophagia, Weight loss, Malnutrition, Dehydration -She has had mild Dysphagia, Odynophagia for 1 year but has worsened in the last 2-3 months before diagnosis; with mid upper right chest pain 8-9/10 with eating and less pain otherwise.  -She was hospitalized 5/18 - 5/25 for J tube placement, complicated by persistent n/v -weight loss on treatment;  followed by nutrition -J tube replaced 7/22 -since completing chemoRT remains reliant on J tube for nutrition and hydration  3.Excessive alcohol usage and heavysmokinghistory,alcohol Cessation  -She has quit smoking 2 years ago and stopped using [place] 1 year ago.H/o alcohol use over many decades.  -she has been counseled to quit smoking and drinking alcohol completely  4. B12 Deficiency  -She has been on oral B12 but has not been able to swallow due to #1/#2 -B12 level 2136 on 5/13, no need for B12 inj currently, will monitor -she has macrocytosis, B12 level is514on 6/14/21which is normalrange butdropped from 2183in 02/2020. I recommendedoral liquid J24, continues folic acid and thiamine   5. Social and Acupuncturist  -She is married with no children. She has in-laws in town. Currently unemployed since late 2019, she was working in a bank. Her husband is a bar tender -She currently does not have insurance, but is looking to get coverage.  -F/u wtihfinancial advocate to help apply for grants and coverage  Disposition: Ms. Aguinaldo appears stable.  Fatigue is mildly improved. She continues to have upper abdominal pain and mild nausea.  I recommend to continue antiemetics and try Bentyl before meals as needed.  I refilled Hycet.  She appears dehydrated with mild hypotension and tachycardia.  She will receive supportive care IVF today and continue twice weekly for the next few weeks.  I recommend for her to double her  J-tube water intake and try to increase oral/J tube nutrition.  Weight is stable, not gaining yet.  She is followed by dietitian.  We reviewed her CBC and chemistry panel.  Hemoglobin 9.9, platelet 449, Albumin 5.4.  Ferritin is elevated, low TIBC she does not have iron deficiency. B12 is pending from today.  The thrombocytosis may be reactive.  She has early signs of J-tube site infection with increased erythema and drainage, I am prescribing liquid doxy.  She is being referred for PET scan in 2 weeks to evaluate treatment response.  She will follow-up with Dr. Kipp Brood on 8/27 for further surgical discussion.  She is requesting a second opinion at [facility] which I have referred her.  We will continue supportive care and follow-up with her after PET scan and next surgery visit.    Orders Placed This Encounter  Procedures  . NM PET Image Restag (PS) Skull Base To Thigh    Standing Status:   Future    Standing Expiration Date:   05/15/2021    Order Specific Question:   If indicated for the ordered procedure, I authorize the administration of a radiopharmaceutical per Radiology protocol    Answer:   Yes    Order Specific Question:   Is the patient pregnant?    Answer:   No    Order Specific Question:   Preferred imaging location?    Answer:   [facility]    Order Specific Question:   Radiology Contrast Protocol - do NOT remove file path    Answer:   [email or web address]  . Ambulatory referral to Cardiothoracic Surgery    Referral Priority:   Routine    Referral Type:   Surgical    Referral Reason:   Specialty Services Required    Requested Specialty:   Cardiothoracic Surgery    Number of Visits Requested:   1   All questions were answered. The patient knows to call the clinic with any problems, questions or concerns. No barriers to learning were detected. Total encounter time was 30 minutes.  Alla Feeling, NP 05/15/20

## 2020-05-14 NOTE — Patient Instructions (Signed)

## 2020-05-15 ENCOUNTER — Inpatient Hospital Stay: Payer: Self-pay

## 2020-05-15 ENCOUNTER — Inpatient Hospital Stay (HOSPITAL_BASED_OUTPATIENT_CLINIC_OR_DEPARTMENT_OTHER): Payer: Self-pay | Admitting: Nurse Practitioner

## 2020-05-15 ENCOUNTER — Other Ambulatory Visit: Payer: Self-pay

## 2020-05-15 ENCOUNTER — Encounter: Payer: Self-pay | Admitting: Nurse Practitioner

## 2020-05-15 ENCOUNTER — Telehealth: Payer: Self-pay | Admitting: Nurse Practitioner

## 2020-05-15 VITALS — BP 101/67 | HR 81

## 2020-05-15 VITALS — BP 99/84 | HR 103 | Temp 97.2°F | Resp 18 | Ht 65.0 in | Wt 125.3 lb

## 2020-05-15 DIAGNOSIS — C155 Malignant neoplasm of lower third of esophagus: Secondary | ICD-10-CM

## 2020-05-15 DIAGNOSIS — C154 Malignant neoplasm of middle third of esophagus: Secondary | ICD-10-CM

## 2020-05-15 LAB — VITAMIN B12: Vitamin B-12: 1955 pg/mL — ABNORMAL HIGH (ref 180–914)

## 2020-05-15 LAB — CMP (CANCER CENTER ONLY)
ALT: 8 U/L (ref 0–44)
AST: 12 U/L — ABNORMAL LOW (ref 15–41)
Albumin: 2.2 g/dL — ABNORMAL LOW (ref 3.5–5.0)
Alkaline Phosphatase: 118 U/L (ref 38–126)
Anion gap: 9 (ref 5–15)
BUN: 11 mg/dL (ref 6–20)
CO2: 21 mmol/L — ABNORMAL LOW (ref 22–32)
Calcium: 9 mg/dL (ref 8.9–10.3)
Chloride: 106 mmol/L (ref 98–111)
Creatinine: 0.54 mg/dL (ref 0.44–1.00)
GFR, Est AFR Am: >60 mL/min (ref >60)
GFR, Estimated: >60 mL/min (ref >60)
Glucose, Bld: 92 mg/dL (ref 70–99)
Potassium: 3.8 mmol/L (ref 3.5–5.1)
Sodium: 136 mmol/L (ref 135–145)
Total Bilirubin: 0.3 mg/dL (ref 0.3–1.2)
Total Protein: 5.4 g/dL — ABNORMAL LOW (ref 6.5–8.1)

## 2020-05-15 LAB — CBC WITH DIFFERENTIAL (CANCER CENTER ONLY)
Abs Immature Granulocytes: 0.15 10*3/uL — ABNORMAL HIGH (ref 0.00–0.07)
Basophils Absolute: 0 10*3/uL (ref 0.0–0.1)
Basophils Relative: 1 %
Eosinophils Absolute: 0.1 10*3/uL (ref 0.0–0.5)
Eosinophils Relative: 1 %
HCT: 30 % — ABNORMAL LOW (ref 36.0–46.0)
Hemoglobin: 9.9 g/dL — ABNORMAL LOW (ref 12.0–15.0)
Immature Granulocytes: 2 %
Lymphocytes Relative: 13 %
Lymphs Abs: 0.8 10*3/uL (ref 0.7–4.0)
MCH: 34.9 pg — ABNORMAL HIGH (ref 26.0–34.0)
MCHC: 33 g/dL (ref 30.0–36.0)
MCV: 105.6 fL — ABNORMAL HIGH (ref 80.0–100.0)
Monocytes Absolute: 1.8 10*3/uL — ABNORMAL HIGH (ref 0.1–1.0)
Monocytes Relative: 27 %
Neutro Abs: 3.8 10*3/uL (ref 1.7–7.7)
Neutrophils Relative %: 56 %
Platelet Count: 449 10*3/uL — ABNORMAL HIGH (ref 150–400)
RBC: 2.84 MIL/uL — ABNORMAL LOW (ref 3.87–5.11)
RDW: 15.5 % (ref 11.5–15.5)
WBC Count: 6.7 10*3/uL (ref 4.0–10.5)
nRBC: 0 % (ref 0.0–0.2)

## 2020-05-15 LAB — IRON AND TIBC
Iron: 61 ug/dL (ref 41–142)
Saturation Ratios: 38 % (ref 21–57)
TIBC: 160 ug/dL — ABNORMAL LOW (ref 236–444)
UIBC: 99 ug/dL — ABNORMAL LOW (ref 120–384)

## 2020-05-15 LAB — FERRITIN: Ferritin: 422 ng/mL — ABNORMAL HIGH (ref 11–307)

## 2020-05-15 MED ORDER — DICYCLOMINE HCL 10 MG/5ML PO SOLN: 10.0000 mg | Freq: Three times a day (TID) | ORAL | 1 refills | Status: DC

## 2020-05-15 MED ORDER — SODIUM CHLORIDE 0.9 % IV SOLN
Freq: Once | INTRAVENOUS | Status: AC
  Filled 2020-05-15: qty 250

## 2020-05-15 MED ORDER — HYDROCODONE-ACETAMINOPHEN 7.5-325 MG/15ML PO SOLN: 5.0000 mL | Freq: Three times a day (TID) | ORAL | 0 refills | Status: DC | PRN

## 2020-05-15 MED ORDER — SODIUM CHLORIDE 0.9 % IV SOLN
Freq: Once | INTRAVENOUS | Status: AC
  Filled 2020-05-15: qty 4

## 2020-05-15 MED ORDER — DOXYCYCLINE CALCIUM 50 MG/5ML PO SYRP: 100.0000 mg | ORAL_SOLUTION | Freq: Two times a day (BID) | ORAL | 0 refills | Status: AC

## 2020-05-15 MED ORDER — ONDANSETRON HCL 4 MG/2ML IJ SOLN
INTRAMUSCULAR | Status: AC
  Filled 2020-05-15: qty 4

## 2020-05-15 NOTE — Progress Notes (Signed)
Nutrition Follow-up:  Patient with esophageal cancer followed by Dr. Burr Medico.  Patient has completed chemotherapy and radiation therapy.  Surgery note reviewed.   Met with patient during infusion.  Patient reports that she is taking about 3-4 cartons of [employer] 1.5 peptide formula via pump.  Patient reports water flush 2-3 times per day and unsure amount "maybe ~ 20 ml."   Patient reports that she is not eating anything by mouth, taking ice chips.   Reports bowel movement about every 2-3 days.  Some nausea.  Medications given during IV fluids.     Medications: reviewed  Labs: reviewed  Anthropometrics:   Weight 125 lb stable from weight on July 19th  130 lb on July 12th   Estimated Energy Needs  Kcals: 1900-2100 Protein: 95-110 g Fluid: 2.0 L  NUTRITION DIAGNOSIS: Inadequate oral intake continues   INTERVENTION:  Reviewed importance of giving 4 cartons of [employer] 1.5 peptide daily via continuous pump to better meet nutritional needs.  Reviewed water flush should be 100 ml 6 times per day to better meet hydration needs.  Wrote scheduled feeding and water flush down on medications sheet for patient to take home.  Patient verbalized understanding. Tube feeding provides 2000 calories, 96 g protein and 1510 ml free water (formula and flush) Provided RD contact information (written)  MONITORING, EVALUATION, GOAL: weight trends, intake, TF tolernace   NEXT VISIT: Wednesday, August 25th during IV fluids  Ervie Mccard B. Zenia Resides, [place], [place] Registered Dietitian [phone] (mobile)

## 2020-05-15 NOTE — Patient Instructions (Signed)
Dehydration, Adult Dehydration is a condition in which there is not enough water or other fluids in the body. This happens when a person loses more fluids than he or she takes in. Important organs, such as the kidneys, brain, and heart, cannot function without a proper amount of fluids. Any loss of fluids from the body can lead to dehydration. Dehydration can be mild, moderate, or severe. It should be treated right away to prevent it from becoming severe. What are the causes? Dehydration may be caused by:  Conditions that cause loss of water or other fluids, such as diarrhea, vomiting, or sweating or urinating a lot.  Not drinking enough fluids, especially when you are ill or doing activities that require a lot of energy.  Other illnesses and conditions, such as fever or infection.  Certain medicines, such as medicines that remove excess fluid from the body (diuretics).  Lack of safe drinking water.  Not being able to get enough water and food. What increases the risk? The following factors may make you more likely to develop this condition:  Having a long-term (chronic) illness that has not been treated properly, such as diabetes, heart disease, or kidney disease.  Being 65 years of age or older.  Having a disability.  Living in a place that is high in altitude, where thinner, drier air causes more fluid loss.  Doing exercises that put stress on your body for a long time (endurance sports). What are the signs or symptoms? Symptoms of dehydration depend on how severe it is. Mild or moderate dehydration  Thirst.  Dry lips or dry mouth.  Dizziness or light-headedness, especially when standing up from a seated position.  Muscle cramps.  Dark urine. Urine may be the color of tea.  Less urine or tears produced than usual.  Headache. Severe dehydration  Changes in skin. Your skin may be cold and clammy, blotchy, or pale. Your skin also may not return to normal after being  lightly pinched and released.  Little or no tears, urine, or sweat.  Changes in vital signs, such as rapid breathing and low blood pressure. Your pulse may be weak or may be faster than 100 beats a minute when you are sitting still.  Other changes, such as: ? Feeling very thirsty. ? Sunken eyes. ? Cold hands and feet. ? Confusion. ? Being very tired (lethargic) or having trouble waking from sleep. ? Short-term weight loss. ? Loss of consciousness. How is this diagnosed? This condition is diagnosed based on your symptoms and a physical exam. You may have blood and urine tests to help confirm the diagnosis. How is this treated? Treatment for this condition depends on how severe it is. Treatment should be started right away. Do not wait until dehydration becomes severe. Severe dehydration is an emergency and needs to be treated in a hospital.  Mild or moderate dehydration can be treated at home. You may be asked to: ? Drink more fluids. ? Drink an oral rehydration solution (ORS). This drink helps restore proper amounts of fluids and salts and minerals in the blood (electrolytes).  Severe dehydration can be treated: ? With IV fluids. ? By correcting abnormal levels of electrolytes. This is often done by giving electrolytes through a tube that is passed through your nose and into your stomach (nasogastric tube, or NG tube). ? By treating the underlying cause of dehydration. Follow these instructions at home: Oral rehydration solution If told by your health care provider, drink an ORS:  Make   an ORS by following instructions on the package.  Start by drinking small amounts, about  cup (120 mL) every 5-10 minutes.  Slowly increase how much you drink until you have taken the amount recommended by your health care provider. Eating and drinking         Drink enough clear fluid to keep your urine pale yellow. If you were told to drink an ORS, finish the ORS first and then start slowly  drinking other clear fluids. Drink fluids such as: ? Water. Do not drink only water. Doing that can lead to hyponatremia, which is having too little salt (sodium) in the body. ? Water from ice chips you suck on. ? Fruit juice that you have added water to (diluted fruit juice). ? Low-calorie sports drinks.  Eat foods that contain a healthy balance of electrolytes, such as bananas, oranges, potatoes, tomatoes, and spinach.  Do not drink alcohol.  Avoid the following: ? Drinks that contain a lot of sugar. These include high-calorie sports drinks, fruit juice that is not diluted, and soda. ? Caffeine. ? Foods that are greasy or contain a lot of fat or sugar. General instructions  Take over-the-counter and prescription medicines only as told by your health care provider.  Do not take sodium tablets. Doing that can lead to having too much sodium in the body (hypernatremia).  Return to your normal activities as told by your health care provider. Ask your health care provider what activities are safe for you.  Keep all follow-up visits as told by your health care provider. This is important. Contact a health care provider if:  You have muscle cramps, pain, or discomfort, such as: ? Pain in your abdomen and the pain gets worse or stays in one area (localizes). ? Stiff neck.  You have a rash.  You are more irritable than usual.  You are sleepier or have a harder time waking than usual.  You feel weak or dizzy.  You feel very thirsty. Get help right away if you have:  Any symptoms of severe dehydration.  Symptoms of vomiting, such as: ? You cannot eat or drink without vomiting. ? Vomiting gets worse or does not go away. ? Vomit includes blood or green matter (bile).  Symptoms that get worse with treatment.  A fever.  A severe headache.  Problems with urination or bowel movements, such as: ? Diarrhea that gets worse or does not go away. ? Blood in your stool (feces). This  may cause stool to look black and tarry. ? Not urinating, or urinating only a small amount of very dark urine, within 6-8 hours.  Trouble breathing. These symptoms may represent a serious problem that is an emergency. Do not wait to see if the symptoms will go away. Get medical help right away. Call your local emergency services (911 in the [place] Do not drive yourself to the hospital. Summary  Dehydration is a condition in which there is not enough water or other fluids in the body. This happens when a person loses more fluids than he or she takes in.  Treatment for this condition depends on how severe it is. Treatment should be started right away. Do not wait until dehydration becomes severe.  Drink enough clear fluid to keep your urine pale yellow. If you were told to drink an oral rehydration solution (ORS), finish the ORS first and then start slowly drinking other clear fluids.  Take over-the-counter and prescription medicines only as told by your health care   provider.  Get help right away if you have any symptoms of severe dehydration. This information is not intended to replace advice given to you by your health care provider. Make sure you discuss any questions you have with your health care provider. Document Revised: 05/04/2019 Document Reviewed: 05/04/2019 Elsevier Patient Education  2020 Elsevier Inc.   

## 2020-05-15 NOTE — Telephone Encounter (Signed)
Scheduled per 8/11 los. Messaged RN Cloyde Reams to give pt appt calendar.

## 2020-05-16 ENCOUNTER — Telehealth: Payer: Self-pay | Admitting: Nurse Practitioner

## 2020-05-16 ENCOUNTER — Telehealth: Payer: Self-pay | Admitting: Hematology

## 2020-05-16 NOTE — Telephone Encounter (Signed)
Released Med rec to [facility] cardiothoracic surgery for referral. Will be faxed to [phone]     Release:  [phone]

## 2020-05-16 NOTE — Telephone Encounter (Signed)
Sent referral to [facility]. Per 8/11 los.

## 2020-05-20 ENCOUNTER — Telehealth (HOSPITAL_COMMUNITY): Payer: Self-pay

## 2020-05-20 ENCOUNTER — Inpatient Hospital Stay: Payer: Self-pay

## 2020-05-20 ENCOUNTER — Other Ambulatory Visit: Payer: Self-pay

## 2020-05-20 VITALS — BP 103/80 | HR 91 | Temp 97.7°F | Resp 16

## 2020-05-20 DIAGNOSIS — C155 Malignant neoplasm of lower third of esophagus: Secondary | ICD-10-CM

## 2020-05-20 MED ORDER — SODIUM CHLORIDE 0.9 % IV SOLN
Freq: Once | INTRAVENOUS | Status: AC
  Filled 2020-05-20: qty 250

## 2020-05-20 NOTE — Progress Notes (Signed)
  Radiation Oncology         [phone] ________________________________  Name: Natalie Morrison MRN: [id number]  Date: 04/25/2020  DOB: 07-Apr-1971  End of Treatment Note  Diagnosis:   esophageal cancer     Indication for treatment::  curative       Radiation treatment dates:   03/18/20 - 04/25/20  Site/dose:   The patient was treated to the esophagus to a dose of 45 Gy in 25 fractions using an IMRT technique. The patient then received a boost for an additional 5.4 Gy to yield a total dose of 50.4 Gy.    Narrative: The patient tolerated radiation treatment and completed the prescribed course.     Plan: The patient has completed radiation treatment. The patient will return to radiation oncology clinic for routine followup in one month. I advised the patient to call or return sooner if they have any questions or concerns related to their recovery or treatment. ________________________________  Jodelle Gross, M.D., Ph.D.

## 2020-05-20 NOTE — Patient Instructions (Signed)
Dehydration, Adult Dehydration is a condition in which there is not enough water or other fluids in the body. This happens when a person loses more fluids than he or she takes in. Important organs, such as the kidneys, brain, and heart, cannot function without a proper amount of fluids. Any loss of fluids from the body can lead to dehydration. Dehydration can be mild, moderate, or severe. It should be treated right away to prevent it from becoming severe. What are the causes? Dehydration may be caused by:  Conditions that cause loss of water or other fluids, such as diarrhea, vomiting, or sweating or urinating a lot.  Not drinking enough fluids, especially when you are ill or doing activities that require a lot of energy.  Other illnesses and conditions, such as fever or infection.  Certain medicines, such as medicines that remove excess fluid from the body (diuretics).  Lack of safe drinking water.  Not being able to get enough water and food. What increases the risk? The following factors may make you more likely to develop this condition:  Having a long-term (chronic) illness that has not been treated properly, such as diabetes, heart disease, or kidney disease.  Being 65 years of age or older.  Having a disability.  Living in a place that is high in altitude, where thinner, drier air causes more fluid loss.  Doing exercises that put stress on your body for a long time (endurance sports). What are the signs or symptoms? Symptoms of dehydration depend on how severe it is. Mild or moderate dehydration  Thirst.  Dry lips or dry mouth.  Dizziness or light-headedness, especially when standing up from a seated position.  Muscle cramps.  Dark urine. Urine may be the color of tea.  Less urine or tears produced than usual.  Headache. Severe dehydration  Changes in skin. Your skin may be cold and clammy, blotchy, or pale. Your skin also may not return to normal after being  lightly pinched and released.  Little or no tears, urine, or sweat.  Changes in vital signs, such as rapid breathing and low blood pressure. Your pulse may be weak or may be faster than 100 beats a minute when you are sitting still.  Other changes, such as: ? Feeling very thirsty. ? Sunken eyes. ? Cold hands and feet. ? Confusion. ? Being very tired (lethargic) or having trouble waking from sleep. ? Short-term weight loss. ? Loss of consciousness. How is this diagnosed? This condition is diagnosed based on your symptoms and a physical exam. You may have blood and urine tests to help confirm the diagnosis. How is this treated? Treatment for this condition depends on how severe it is. Treatment should be started right away. Do not wait until dehydration becomes severe. Severe dehydration is an emergency and needs to be treated in a hospital.  Mild or moderate dehydration can be treated at home. You may be asked to: ? Drink more fluids. ? Drink an oral rehydration solution (ORS). This drink helps restore proper amounts of fluids and salts and minerals in the blood (electrolytes).  Severe dehydration can be treated: ? With IV fluids. ? By correcting abnormal levels of electrolytes. This is often done by giving electrolytes through a tube that is passed through your nose and into your stomach (nasogastric tube, or NG tube). ? By treating the underlying cause of dehydration. Follow these instructions at home: Oral rehydration solution If told by your health care provider, drink an ORS:  Make   an ORS by following instructions on the package.  Start by drinking small amounts, about  cup (120 mL) every 5-10 minutes.  Slowly increase how much you drink until you have taken the amount recommended by your health care provider. Eating and drinking         Drink enough clear fluid to keep your urine pale yellow. If you were told to drink an ORS, finish the ORS first and then start slowly  drinking other clear fluids. Drink fluids such as: ? Water. Do not drink only water. Doing that can lead to hyponatremia, which is having too little salt (sodium) in the body. ? Water from ice chips you suck on. ? Fruit juice that you have added water to (diluted fruit juice). ? Low-calorie sports drinks.  Eat foods that contain a healthy balance of electrolytes, such as bananas, oranges, potatoes, tomatoes, and spinach.  Do not drink alcohol.  Avoid the following: ? Drinks that contain a lot of sugar. These include high-calorie sports drinks, fruit juice that is not diluted, and soda. ? Caffeine. ? Foods that are greasy or contain a lot of fat or sugar. General instructions  Take over-the-counter and prescription medicines only as told by your health care provider.  Do not take sodium tablets. Doing that can lead to having too much sodium in the body (hypernatremia).  Return to your normal activities as told by your health care provider. Ask your health care provider what activities are safe for you.  Keep all follow-up visits as told by your health care provider. This is important. Contact a health care provider if:  You have muscle cramps, pain, or discomfort, such as: ? Pain in your abdomen and the pain gets worse or stays in one area (localizes). ? Stiff neck.  You have a rash.  You are more irritable than usual.  You are sleepier or have a harder time waking than usual.  You feel weak or dizzy.  You feel very thirsty. Get help right away if you have:  Any symptoms of severe dehydration.  Symptoms of vomiting, such as: ? You cannot eat or drink without vomiting. ? Vomiting gets worse or does not go away. ? Vomit includes blood or green matter (bile).  Symptoms that get worse with treatment.  A fever.  A severe headache.  Problems with urination or bowel movements, such as: ? Diarrhea that gets worse or does not go away. ? Blood in your stool (feces). This  may cause stool to look black and tarry. ? Not urinating, or urinating only a small amount of very dark urine, within 6-8 hours.  Trouble breathing. These symptoms may represent a serious problem that is an emergency. Do not wait to see if the symptoms will go away. Get medical help right away. Call your local emergency services (911 in the [place] Do not drive yourself to the hospital. Summary  Dehydration is a condition in which there is not enough water or other fluids in the body. This happens when a person loses more fluids than he or she takes in.  Treatment for this condition depends on how severe it is. Treatment should be started right away. Do not wait until dehydration becomes severe.  Drink enough clear fluid to keep your urine pale yellow. If you were told to drink an oral rehydration solution (ORS), finish the ORS first and then start slowly drinking other clear fluids.  Take over-the-counter and prescription medicines only as told by your health care   provider.  Get help right away if you have any symptoms of severe dehydration. This information is not intended to replace advice given to you by your health care provider. Make sure you discuss any questions you have with your health care provider. Document Revised: 05/04/2019 Document Reviewed: 05/04/2019 Elsevier Patient Education  2020 Elsevier Inc.   

## 2020-05-22 ENCOUNTER — Inpatient Hospital Stay: Payer: Self-pay

## 2020-05-22 ENCOUNTER — Other Ambulatory Visit: Payer: Self-pay

## 2020-05-22 VITALS — BP 99/71 | HR 70 | Temp 97.8°F | Resp 18

## 2020-05-22 DIAGNOSIS — C155 Malignant neoplasm of lower third of esophagus: Secondary | ICD-10-CM

## 2020-05-22 MED ORDER — SODIUM CHLORIDE 0.9 % IV SOLN
Freq: Once | INTRAVENOUS | Status: AC
  Filled 2020-05-22: qty 250

## 2020-05-27 ENCOUNTER — Telehealth: Payer: Self-pay | Admitting: Radiation Oncology

## 2020-05-27 NOTE — Telephone Encounter (Signed)
  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 The patient then received a boost for an additional 5.4 Gy to yield a total dose of 50.4 Gy.   Narrative:  The patient was contacted today for routine follow-up. During treatment she did very well with radiotherapy and did not have significant desquamation. Notes from other providers indicated she will have repeat PET scan and possibly esophagectomy with the hopes of improving her nutritional status prior to that time.  Impression/Plan: 1. 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 I was unable to reach the patient but on my voicemail asked her to call back so we could discuss how she is doing. I also discussed that we would be happy to continue to follow her as needed, but she will also continue to follow up with Dr. Burr Medico in medical oncology and Dr. Kipp Brood in [facility].     Carola Rhine, PAC

## 2020-05-28 ENCOUNTER — Other Ambulatory Visit: Payer: Self-pay

## 2020-05-28 ENCOUNTER — Inpatient Hospital Stay: Payer: Self-pay

## 2020-05-28 VITALS — BP 101/76 | HR 85 | Temp 98.0°F | Resp 20

## 2020-05-28 DIAGNOSIS — C155 Malignant neoplasm of lower third of esophagus: Secondary | ICD-10-CM

## 2020-05-28 DIAGNOSIS — E860 Dehydration: Secondary | ICD-10-CM

## 2020-05-28 MED ORDER — SODIUM CHLORIDE 0.9 % IV SOLN
Freq: Once | INTRAVENOUS | Status: AC
  Filled 2020-05-28: qty 250

## 2020-05-28 MED ORDER — ONDANSETRON HCL 4 MG/2ML IJ SOLN
INTRAMUSCULAR | Status: AC
  Filled 2020-05-28: qty 4

## 2020-05-28 MED ORDER — ONDANSETRON HCL 4 MG/2ML IJ SOLN
8.0000 mg | Freq: Once | INTRAMUSCULAR | Status: AC
  Administered 2020-05-28: 8 mg via INTRAVENOUS

## 2020-05-28 MED ORDER — SODIUM CHLORIDE 0.9 % IV SOLN: Freq: Once | INTRAVENOUS | Status: DC

## 2020-05-28 NOTE — Patient Instructions (Signed)
Rehydration, Adult Rehydration is the replacement of body fluids and salts and minerals (electrolytes) that are lost during dehydration. Dehydration is when there is not enough fluid or water in the body. This happens when you lose more fluids than you take in. Common causes of dehydration include:  Vomiting.  Diarrhea.  Excessive sweating, such as from heat exposure or exercise.  Taking medicines that cause the body to lose excess fluid (diuretics).  Impaired kidney function.  Not drinking enough fluid.  Certain illnesses or infections.  Certain poorly controlled long-term (chronic) illnesses, such as diabetes, heart disease, and kidney disease.  Symptoms of mild dehydration may include thirst, dry lips and mouth, dry skin, and dizziness. Symptoms of severe dehydration may include increased heart rate, confusion, fainting, and not urinating. You can rehydrate by drinking certain fluids or getting fluids through an IV tube, as told by your health care provider. What are the risks? Generally, rehydration is safe. However, one problem that can happen is taking in too much fluid (overhydration). This is rare. If overhydration happens, it can cause an electrolyte imbalance, kidney failure, or a decrease in salt (sodium) levels in the body. How to rehydrate Follow instructions from your health care provider for rehydration. The kind of fluid you should drink and the amount you should drink depend on your condition.  If directed by your health care provider, drink an oral rehydration solution (ORS). This is a drink designed to treat dehydration that is found in pharmacies and retail stores. ? Make an ORS by following instructions on the package. ? Start by drinking small amounts, about  cup (120 mL) every 5-10 minutes. ? Slowly increase how much you drink until you have taken the amount recommended by your health care provider.  Drink enough clear fluids to keep your urine clear or pale  yellow. If you were instructed to drink an ORS, finish the ORS first, then start slowly drinking other clear fluids. Drink fluids such as: ? Water. Do not drink only water. Doing that can lead to having too little sodium in your body (hyponatremia). ? Ice chips. ? Fruit juice that you have added water to (diluted juice). ? Low-calorie sports drinks.  If you are severely dehydrated, your health care provider may recommend that you receive fluids through an IV tube in the hospital.  Do not take sodium tablets. Doing that can lead to the condition of having too much sodium in your body (hypernatremia). Eating while you rehydrate Follow instructions from your health care provider about what to eat while you rehydrate. Your health care provider may recommend that you slowly begin eating regular foods in small amounts.  Eat foods that contain a healthy balance of electrolytes, such as bananas, oranges, potatoes, tomatoes, and spinach.  Avoid foods that are greasy or contain a lot of fat or sugar.  In some cases, you may get nutrition through a feeding tube that is passed through your nose and into your stomach (nasogastric tube, or NG tube). This may be done if you have uncontrolled vomiting or diarrhea. Beverages to avoid Certain beverages may make dehydration worse. While you rehydrate, avoid:  Alcohol.  Caffeine.  Drinks that contain a lot of sugar. These include: ? High-calorie sports drinks. ? Fruit juice that is not diluted. ? Soda.  Check nutrition labels to see how much sugar or caffeine a beverage contains. Signs of dehydration recovery You may be recovering from dehydration if:  You are urinating more often than before you started   rehydrating.  Your urine is clear or pale yellow.  Your energy level improves.  You vomit less frequently.  You have diarrhea less frequently.  Your appetite improves or returns to normal.  You feel less dizzy or less light-headed.  Your  skin tone and color start to look more normal. Contact a health care provider if:  You continue to have symptoms of mild dehydration, such as: ? Thirst. ? Dry lips. ? Slightly dry mouth. ? Dry, warm skin. ? Dizziness.  You continue to vomit or have diarrhea. Get help right away if:  You have symptoms of dehydration that get worse.  You feel: ? Confused. ? Weak. ? Like you are going to faint.  You have not urinated in 6-8 hours.  You have very dark urine.  You have trouble breathing.  Your heart rate while sitting still is over 100 beats a minute.  You cannot drink fluids without vomiting.  You have vomiting or diarrhea that: ? Gets worse. ? Does not go away.  You have a fever. This information is not intended to replace advice given to you by your health care provider. Make sure you discuss any questions you have with your health care provider. Document Revised: 09/03/2017 Document Reviewed: 11/15/2015 Elsevier Patient Education  2020 Elsevier Inc.  Ondansetron injection What is this medicine? ONDANSETRON (on DAN se tron) is used to treat nausea and vomiting caused by chemotherapy. It is also used to prevent or treat nausea and vomiting after surgery. This medicine may be used for other purposes; ask your health care provider or pharmacist if you have questions. COMMON BRAND NAME(S): Zofran What should I tell my health care provider before I take this medicine? They need to know if you have any of these conditions:  heart disease  history of irregular heartbeat  liver disease  low levels of magnesium or potassium in the blood  an unusual or allergic reaction to ondansetron, granisetron, other medicines, foods, dyes, or preservatives  pregnant or trying to get pregnant  breast-feeding How should I use this medicine? This medicine is for infusion into a vein. It is given by a health care professional in a hospital or clinic setting. Talk to your  pediatrician regarding the use of this medicine in children. Special care may be needed. Overdosage: If you think you have taken too much of this medicine contact a poison control center or emergency room at once. NOTE: This medicine is only for you. Do not share this medicine with others. What if I miss a dose? This does not apply. What may interact with this medicine? Do not take this medicine with any of the following medications:  apomorphine  certain medicines for fungal infections like fluconazole, itraconazole, ketoconazole, posaconazole, voriconazole  cisapride  dronedarone  pimozide  thioridazine This medicine may also interact with the following medications:  carbamazepine  certain medicines for depression, anxiety, or psychotic disturbances  fentanyl  linezolid  MAOIs like Carbex, Eldepryl, Marplan, Nardil, and Parnate  methylene blue (injected into a vein)  other medicines that prolong the QT interval (cause an abnormal heart rhythm) like dofetilide, ziprasidone  phenytoin  rifampicin  tramadol This list may not describe all possible interactions. Give your health care provider a list of all the medicines, herbs, non-prescription drugs, or dietary supplements you use. Also tell them if you smoke, drink alcohol, or use illegal drugs. Some items may interact with your medicine. What should I watch for while using this medicine? Your condition will   be monitored carefully while you are receiving this medicine. What side effects may I notice from receiving this medicine? Side effects that you should report to your doctor or health care professional as soon as possible:  allergic reactions like skin rash, itching or hives, swelling of the face, lips, or tongue  breathing problems  confusion  dizziness  fast or irregular heartbeat  feeling faint or lightheaded, falls  fever and chills  loss of balance or coordination  seizures  sweating  swelling  of the hands and feet  tightness in the chest  tremors  unusually weak or tired Side effects that usually do not require medical attention (report to your doctor or health care professional if they continue or are bothersome):  constipation or diarrhea  headache This list may not describe all possible side effects. Call your doctor for medical advice about side effects. You may report side effects to FDA at 1-800-FDA-1088. Where should I keep my medicine? This drug is given in a hospital or clinic and will not be stored at home. NOTE: This sheet is a summary. It may not cover all possible information. If you have questions about this medicine, talk to your doctor, pharmacist, or health care provider.  2020 Elsevier/Gold Standard (2018-09-13 07:12:42)  

## 2020-05-29 ENCOUNTER — Inpatient Hospital Stay: Payer: Self-pay

## 2020-05-29 ENCOUNTER — Inpatient Hospital Stay: Payer: Self-pay | Admitting: Nutrition

## 2020-05-29 ENCOUNTER — Other Ambulatory Visit: Payer: Self-pay

## 2020-05-29 DIAGNOSIS — C155 Malignant neoplasm of lower third of esophagus: Secondary | ICD-10-CM

## 2020-05-29 MED ORDER — SODIUM CHLORIDE 0.9 % IV SOLN
Freq: Once | INTRAVENOUS | Status: AC
  Filled 2020-05-29: qty 250

## 2020-05-29 NOTE — Patient Instructions (Signed)

## 2020-05-29 NOTE — Progress Notes (Signed)
Nutrition follow-up completed with patient during IV fluids.  Patient is status post chemotherapy and radiation treatments for esophageal cancer. Patient reports she is now tolerating 4 cartons of [employer] 1.5 peptide daily via continuous feedings through J-tube. States she is flushing feeding tube with 60 mL of free water every 2 hours. (not sure how many hours daily) Patient denies nutrition impact symptoms at this time. Voices concern regarding possibly having to pay for her tube feeding since she is uninsured.  Estimated nutrition needs: 1900-2100 cal, 95-110 g protein, 2.0 L fluid.  Nutrition diagnosis: Inadequate oral intake continues. Suboptimal enteral nutrition infusion improved.  Intervention: Educated patient to continue strategies for giving tube feedings at goal rate 4 cartons daily. This provides 2000 cal, 96 g protein, meeting 100% estimated nutrition needs. I have contacted [employer] to get clarification on tube feeding delivery and cost.  Monitoring, evaluation, goals:  Will continue to monitor weight, tube feeding tolerance and oral intake.  Next visit: Tuesday, August 31 during infusion.  **Disclaimer: This note was dictated with voice recognition software. Similar sounding words can inadvertently be transcribed and this note may contain transcription errors which may not have been corrected upon publication of note.**

## 2020-05-31 ENCOUNTER — Encounter: Payer: Self-pay | Admitting: Thoracic Surgery (Cardiothoracic Vascular Surgery)

## 2020-05-31 ENCOUNTER — Telehealth: Payer: Self-pay

## 2020-05-31 NOTE — Telephone Encounter (Signed)
Spoke w/ Dr. Kipp Brood d/t pt not having PET scan or abdominal x-ray prior to scheduled appointment at 12:45. He states that pt should have a PET scan prior to following up with him. TC to pt to notify. She states she doesn't think the PET scan has been scheduled. Advised to call the office of L. Kalman Shan, NP (who ordered PET scan) to set this up and to call [facility] to reschedule appt with Dr. Kipp Brood following PET scan. Pt verbalizes understanding.

## 2020-06-01 ENCOUNTER — Other Ambulatory Visit: Payer: Self-pay | Admitting: Medical

## 2020-06-04 ENCOUNTER — Inpatient Hospital Stay: Payer: Self-pay

## 2020-06-04 ENCOUNTER — Inpatient Hospital Stay: Payer: Self-pay | Admitting: Nutrition

## 2020-06-04 ENCOUNTER — Encounter: Payer: Self-pay | Admitting: Hematology

## 2020-06-04 NOTE — Progress Notes (Signed)
After noticing patient appointment for today was canceled, called patient to re-introduce myself as Arboriculturist and to offer available resources. Advised about the [employer] and what is needed to apply. She verbalized understanding. Asked if she can provide tomorrow at visit. She states she can. I will meet her at registration 06/05/20.  Advised [place] w/ [employer] attempted to reach her regarding Medicaid screening. Provided her Brittany's contact number for her to follow up.  I will provide my card as well as Brittany's 06/05/20.

## 2020-06-04 NOTE — Progress Notes (Signed)
Patient canceled infusion appointment today.  I called her at home for tube feeding follow-up.  She is receiving tube feedings via J-tube secondary to esophageal cancer. She has been receiving [employer] 1.5 peptide daily through [employer]. Reports she is tolerating 4 cartons of tube feeding daily with 60 mL of free water every 2 hours. Reports she is working to request financial assistance. Reports she needs to order more tube feeding but did not have home cares number.  Estimated nutrition needs: 1900-2100 cal, 95-110 g protein, 2.0 L fluid.  Nutrition diagnosis: Inadequate oral intake continues. Suboptimal enteral nutrition infusion resolved.  Intervention: Patient educated to continue [employer] 1.5 peptide, 4 cartons daily providing 100% estimated nutrition needs. Provided home care agencies phone number for patient to contact them. Encourage patient to call me with further questions or problems.  Monitoring, evaluation, goals: Patient will tolerate tube feeding to meet 100% estimated needs for healing.  Next visit: To be scheduled as needed.  Patient has my contact information.  Please refer back to RD if nutrition problems are identified prior to follow-up.  **Disclaimer: This note was dictated with voice recognition software. Similar sounding words can inadvertently be transcribed and this note may contain transcription errors which may not have been corrected upon publication of note.**

## 2020-06-05 ENCOUNTER — Inpatient Hospital Stay: Payer: Self-pay | Attending: Hematology

## 2020-06-05 ENCOUNTER — Other Ambulatory Visit: Payer: Self-pay | Admitting: Family Medicine

## 2020-06-05 ENCOUNTER — Other Ambulatory Visit: Payer: Self-pay

## 2020-06-05 ENCOUNTER — Encounter: Payer: Self-pay | Admitting: Hematology

## 2020-06-05 VITALS — BP 92/73 | HR 96 | Temp 98.0°F | Resp 18

## 2020-06-05 DIAGNOSIS — E46 Unspecified protein-calorie malnutrition: Secondary | ICD-10-CM | POA: Insufficient documentation

## 2020-06-05 DIAGNOSIS — Z882 Allergy status to sulfonamides status: Secondary | ICD-10-CM | POA: Insufficient documentation

## 2020-06-05 DIAGNOSIS — R55 Syncope and collapse: Secondary | ICD-10-CM | POA: Insufficient documentation

## 2020-06-05 DIAGNOSIS — E538 Deficiency of other specified B group vitamins: Secondary | ICD-10-CM | POA: Insufficient documentation

## 2020-06-05 DIAGNOSIS — D7589 Other specified diseases of blood and blood-forming organs: Secondary | ICD-10-CM | POA: Insufficient documentation

## 2020-06-05 DIAGNOSIS — C155 Malignant neoplasm of lower third of esophagus: Secondary | ICD-10-CM | POA: Insufficient documentation

## 2020-06-05 DIAGNOSIS — R634 Abnormal weight loss: Secondary | ICD-10-CM | POA: Insufficient documentation

## 2020-06-05 DIAGNOSIS — Z881 Allergy status to other antibiotic agents status: Secondary | ICD-10-CM | POA: Insufficient documentation

## 2020-06-05 DIAGNOSIS — E860 Dehydration: Secondary | ICD-10-CM | POA: Insufficient documentation

## 2020-06-05 DIAGNOSIS — R599 Enlarged lymph nodes, unspecified: Secondary | ICD-10-CM | POA: Insufficient documentation

## 2020-06-05 DIAGNOSIS — R1310 Dysphagia, unspecified: Secondary | ICD-10-CM | POA: Insufficient documentation

## 2020-06-05 DIAGNOSIS — Z79899 Other long term (current) drug therapy: Secondary | ICD-10-CM | POA: Insufficient documentation

## 2020-06-05 DIAGNOSIS — I700 Atherosclerosis of aorta: Secondary | ICD-10-CM | POA: Insufficient documentation

## 2020-06-05 DIAGNOSIS — Z560 Unemployment, unspecified: Secondary | ICD-10-CM | POA: Insufficient documentation

## 2020-06-05 DIAGNOSIS — Z923 Personal history of irradiation: Secondary | ICD-10-CM | POA: Insufficient documentation

## 2020-06-05 MED ORDER — ONDANSETRON HCL 4 MG/2ML IJ SOLN
INTRAMUSCULAR | Status: AC
  Filled 2020-06-05: qty 4

## 2020-06-05 MED ORDER — SODIUM CHLORIDE 0.9 % IV SOLN
Freq: Once | INTRAVENOUS | Status: AC
  Filled 2020-06-05: qty 250

## 2020-06-05 MED ORDER — ONDANSETRON HCL 4 MG/2ML IJ SOLN
8.0000 mg | Freq: Once | INTRAMUSCULAR | Status: AC
  Administered 2020-06-05: 8 mg via INTRAVENOUS

## 2020-06-05 MED ORDER — SODIUM CHLORIDE 0.9 % IV SOLN: Freq: Once | INTRAVENOUS | Status: DC

## 2020-06-05 NOTE — Patient Instructions (Signed)

## 2020-06-05 NOTE — Progress Notes (Signed)
Met with patient at registration whom brought proof of income for [employer].  Patient approved for the one-time $1000 grant to assist with personal expenses while going through treatment. She has a copy of the approval letter as well as the expense sheet along with the Outpatient pharmacy information.  She has my card for any additional financial questions or concerns.

## 2020-06-12 ENCOUNTER — Telehealth: Payer: Self-pay

## 2020-06-12 ENCOUNTER — Other Ambulatory Visit: Payer: Self-pay

## 2020-06-12 ENCOUNTER — Inpatient Hospital Stay: Payer: Self-pay

## 2020-06-12 VITALS — BP 92/60 | HR 70 | Temp 97.9°F | Resp 16

## 2020-06-12 DIAGNOSIS — C155 Malignant neoplasm of lower third of esophagus: Secondary | ICD-10-CM

## 2020-06-12 MED ORDER — SODIUM CHLORIDE 0.9 % IV SOLN
Freq: Once | INTRAVENOUS | Status: AC
  Filled 2020-06-12: qty 250

## 2020-06-12 NOTE — Telephone Encounter (Signed)
Called to make pt aware of upcoming appt for PET scan Monday Sept 13 2021 @ 1230 arrival unable to speak with pt message left

## 2020-06-12 NOTE — Patient Instructions (Signed)

## 2020-06-12 NOTE — Progress Notes (Signed)
[place]   [phone] [phone]   Clinic Follow up Note   Patient Care Team: Eulas Post, MD as PCP - General Jonnie Finner, RN as Oncology Nurse Navigator Truitt Merle, MD as Consulting Physician (Hematology) Michael Boston, MD as Consulting Physician (General Surgery) Kyung Rudd, MD as Consulting Physician (Radiation Oncology) Mauri Pole, MD as Consulting Physician (Gastroenterology) Jackelyn Knife, MD as Rounding Team (Internal Medicine) Karie Mainland, RD as Dietitian (Nutrition)  Date of Service:  06/13/2020  CHIEF COMPLAINT:  F/u of esophageal cancer  SUMMARY OF ONCOLOGIC HISTORY: Oncology History Overview Note  Cancer Staging Esophageal cancer [facility]) Staging form: Esophagus - Other Histologies, AJCC 8th Edition - Clinical stage from 02/11/2020: cTX, cN1, cM0 - Signed by Truitt Merle, MD on 02/14/2020    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 Biopsied. - Food in the middle third of the esophagus. Removal was successful.   02/08/2020 Initial Biopsy   Diagnosis 02/08/20 Esophagus, biopsy, mass - INVASIVE SQUAMOUS CELL CARCINOMA - SEE COMMENT Microscopic Comment Based on the biopsy, the carcinoma appears moderately differentiated. Dr. Silverio Decamp was paged on Feb 09, 2020. Dr. Jeannie Done reviewed the case and agrees with the above diagnosis.    02/11/2020 Cancer Staging   Staging form: Esophagus - Other Histologies, AJCC 8th Edition - Clinical stage from 02/11/2020: cTX, cN1, cM0 - Signed by Truitt Merle, MD on 02/14/2020   02/14/2020 Initial Diagnosis   Esophageal cancer ([place])   02/14/2020 Imaging   CT CAP w contrast  IMPRESSION: 1. Irregular wall thickening and an ill-defined mass involving the distal esophagus  consistent with known esophageal cancer. 2. Mildly enlarged lymph nodes lateral to the gastroesophageal junction and in the gastrohepatic ligament, suspicious for metastatic disease. 3. No evidence of distant metastatic disease. 4. 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 5. Aortic Atherosclerosis (ICD10-I70.0).   03/18/2020 - 04/22/2020 Chemotherapy   Concurrent chemoRT with weekly carboplatin and Taxol starting 03/18/20-04/22/20   03/18/2020 - 04/25/2020 Radiation Therapy   Concurrent chemo RT with weekly CT by Dr Lisbeth Renshaw starting 03/18/20-04/25/20      CURRENT THERAPY:  Pending esophagectomy  INTERVAL HISTORY:  Natalie Morrison is here for a follow up. She presents to the clinic alone.  She completed concurrent chemoradiation on 04/25/2020.  She is recovering well from treatment. Feels better overall, more energy Eating soft diet 1-2 times daily, she still has mild odynophagia.  She is on feeding tube at night, weight is stable.  She denies any other new pain, no cough, dyspnea, or abdominal discomfort.  Review of system otherwise negative.  She went to [facility] and saw surgeon Dr. Elenor Quinones for second opinion.  Surgery was recommended, and that she has decided to proceed at the [facility].  She is scheduled for esophagectomy on Tober 18 2021.   MEDICAL HISTORY:  Past Medical History:  Diagnosis Date  . Alcohol abuse   . Anemia   . Anxiety   . Anxiety and depression   . Asthma   . Depression   . Hepatitis A    "when I was a kid"  . Hypertension     SURGICAL HISTORY: Past Surgical History:  Procedure Laterality Date  . BREAST LUMPECTOMY Right   . GASTROSTOMY N/A 02/22/2020   Procedure: OPEN PLACEMENT JEJUNOSTOMY  FEEDING TUBE;  Surgeon: Jesusita Oka, MD;  Location: [place];  Service: General;  Laterality: N/A;  . IR REPLC DUODEN/JEJUNO TUBE PERCUT Vivianne Master  04/25/2020  . TONSILLECTOMY AND ADENOIDECTOMY Bilateral over 30 years ago  . UPPER GASTROINTESTINAL  ENDOSCOPY      I have reviewed the social history and family history with the patient and they are unchanged from previous note.  ALLERGIES:  is allergic to nitrofurantoin and sulfonamide derivatives.  MEDICATIONS:  Current Outpatient Medications  Medication Sig Dispense Refill  . acetaminophen (TYLENOL) 325 MG tablet Take 650 mg by mouth every 6 (six) hours as needed for mild pain or headache.    Marland Kitchen amLODipine (NORVASC) 10 MG tablet 1 tablet (10 mg total) by Per J Tube route daily. 30 tablet 0  . Cyanocobalamin 1000 MCG/15ML LIQD Take 15 mLs (1,000 mcg total) by mouth daily. 450 mL 1  . dicyclomine (BENTYL) 10 MG/5ML solution Take 5 mLs (10 mg total) by mouth 4 (four) times daily -  before meals and at bedtime. 161 mL 1  . folic acid (FOLVITE) 1 MG tablet Place 1 tablet (1 mg total) into feeding tube daily. 30 tablet 1  . HYDROcodone-acetaminophen (HYCET) 7.5-325 mg/15 ml solution Take 5-10 mLs by mouth every 8 (eight) hours as needed for moderate pain or severe pain. Take 30 minutes before meal for severe pain 240 mL 0  . loratadine (CLARITIN) 10 MG tablet Take 1 tablet (10 mg total) by mouth daily. 30 tablet 2  . metoCLOPramide (REGLAN) 5 MG tablet Place 1 tablet (5 mg total) into feeding tube 3 (three) times daily. 30 tablet 1  . Nutritional Supplements (KATE FARMS PEPTIDE 1.5) LIQD Discontinue Osmolite 1.5.  Give [employer] peptide 1.5 at 95 mL an hour via jejunostomy feeding tube over 21 hours to provide 2000 cal, 96 g of protein and 1400 mL water.  Give 100 mL water flush 6 times daily.  This will meet 100% estimated nutrition needs. 1300 mL 6  . simethicone (MYLICON) 40 WR/6.0AV drops Place 0.6 mLs (40 mg total) into feeding tube 4 (four) times daily as needed (bloating). 30 mL 0  . sucralfate (CARAFATE) 1 GM/10ML suspension Take 10 mLs (1 g total) by mouth 4 (four) times daily -  with meals and at bedtime. 420 mL 0  . thiamine 100 MG tablet 1 tablet (100 mg total) by Per J Tube route  daily. 30 tablet 1   Current Facility-Administered Medications  Medication Dose Route Frequency Provider Last Rate Last Admin  . 0.9 %  sodium chloride infusion  500 mL Intravenous Once Nandigam, Venia Minks, MD       Facility-Administered Medications Ordered in Other Visits  Medication Dose Route Frequency Provider Last Rate Last Admin  . ondansetron (ZOFRAN) injection 8 mg  8 mg Intravenous Once Cira Rue K, NP      . promethazine (PHENERGAN) injection 25 mg  25 mg Intravenous Once Cira Rue K, NP      . sodium chloride flush (NS) 0.9 % injection 10 mL  10 mL Intracatheter Once PRN Alla Feeling, NP        PHYSICAL EXAMINATION: ECOG PERFORMANCE STATUS: 2 - Symptomatic, <50% confined to bed  Vitals:   06/13/20 1303  BP: 107/82  Pulse: 85  Resp: 16  Temp: (!) 97.5 F (36.4 C)  SpO2: 99%   Filed Weights   06/13/20 1303  Weight: 125 lb 1.6 oz (56.7 kg)    GENERAL:alert, no distress  and comfortable SKIN: skin color, texture, turgor are normal, no rashes or significant lesions EYES: normal, Conjunctiva are pink and non-injected, sclera clear NECK: supple, thyroid normal size, non-tender, without nodularity LYMPH:  no palpable lymphadenopathy in the cervical, axillary  LUNGS: clear to auscultation and percussion with normal breathing effort HEART: regular rate & rhythm and no murmurs and no lower extremity edema ABDOMEN:abdomen soft, non-tender and normal bowel sounds, (+) feeding tube site is clean with mild skin erythema around the tube, no discharge  Musculoskeletal:no cyanosis of digits and no clubbing  NEURO: alert & oriented x 3 with fluent speech, no focal motor/sensory deficits  LABORATORY DATA:  I have reviewed the data as listed CBC Latest Ref Rng & Units 06/13/2020 05/15/2020 05/02/2020  WBC 4.0 - 10.5 K/uL 8.4 6.7 4.1  Hemoglobin 12.0 - 15.0 g/dL 10.2(L) 9.9(L) 9.9(L)  Hematocrit 36 - 46 % 31.5(L) 30.0(L) 30.9(L)  Platelets 150 - 400 K/uL 360 449(H) 266      CMP Latest Ref Rng & Units 06/13/2020 05/15/2020 05/02/2020  Glucose 70 - 99 mg/dL 98 92 105(H)  BUN 6 - 20 mg/dL 10 11 12   Creatinine 0.44 - 1.00 mg/dL 0.67 0.54 0.64  Sodium 135 - 145 mmol/L 140 136 136  Potassium 3.5 - 5.1 mmol/L 4.0 3.8 4.0  Chloride 98 - 111 mmol/L 108 106 101  CO2 22 - 32 mmol/L 24 21(L) 23  Calcium 8.9 - 10.3 mg/dL 9.8 9.0 9.8  Total Protein 6.5 - 8.1 g/dL 6.7 5.4(L) 6.1(L)  Total Bilirubin 0.3 - 1.2 mg/dL 0.4 0.3 0.3  Alkaline Phos 38 - 126 U/L 81 118 91  AST 15 - 41 U/L 17 12(L) 8(L)  ALT 0 - 44 U/L 14 8 7       RADIOGRAPHIC STUDIES: I have personally reviewed the radiological images as listed and agreed with the findings in the report. No results found.   ASSESSMENT & PLAN:  Natalie Morrison is a 49 y.o. female with    1. Esophagealsquamous cell carcinoma,in midesophagus, cTxN1M0 -She was diagnosed in 02/2020.EGD showeda partially obstructing tumor, biopsy confirmedinvasive squamous cells carcinoma. Imaging indicates mildly enlarged LNs lateral to the GEJ, she hasThere is no evidence of distant metastasis. -Her5/28/21 PET scan showedknown esophageal mass and hypermetabolic LNs in the paraesophageal, retrotracheal, and gastrohepatic areas, consistent with locally advanced disease. --She has completed concurrent chemoRT with weeklycarboplatin and Taxol 03/18/20- 04/25/20.  -she has decided to proceed with esophagectomy at the [facility], surgery is scheduled for July 22, 2020 -I discussed the recently published data of [place] 577 trial, which showed significant survival benefit of adjuvant nivolumab after neoadjuvant chemoradiation and esophagectomy.  If she has residual disease on surgical path, I recommend 1 year adjuvant nivolumab.  Benefit and side effects discussed with her, she is interested. -will see her back one month after surgery  -She is scheduled for PET scan next week to evaluate her response to chemo and radiation.  I will call her to discuss  results.  2. Dysphagia, Odynophagia, Weight loss, Malnutrition, Dehydration -Much improved after she completed chemoradiation -She is still on tube feeds -She is tolerating soft diet well    3.Excessive alcohol usage and heavysmokinghistory,alcohol Cessation  -She has quit smoking 2 years ago and stopped using [employer] 1 year ago. She rarely drinks alcohol now and stopped since her pain, but was drinking for 50 years.    4. B12 Deficiency  -She has been on oral B12 but has not been able to swallow due  to #1/#2 -B12 level 2136 on 5/13, no need for B12 injectionscurrently, will monitor -She has macrocytosis, B27from 03/05/20 was 2183.  5. Social and Acupuncturist  -She is married with no children. She has in-laws in town -She is currently unemployed since late 2019, she was working in a bank. Her husband is a bar tender -She currently does not have insurance, but is looking to get coverage. -F/uwithfinancial advocate to help apply for grants and coverage   PLAN: -PET scan scheduled for next Monday, I will call her to review the results -We will copy Dr. Elenor Quinones at [facility]  -f/u one month after surgery at [facility] on 10/18   No problem-specific Assessment & Plan notes found for this encounter.   No orders of the defined types were placed in this encounter.  All questions were answered. The patient knows to call the clinic with any problems, questions or concerns. No barriers to learning was detected. The total time spent in the appointment was 30 minutes.     Truitt Merle, MD 06/13/2020   I, Joslyn Devon, am acting as scribe for Truitt Merle, MD.   I have reviewed the above documentation for accuracy and completeness, and I agree with the above.

## 2020-06-13 ENCOUNTER — Inpatient Hospital Stay: Payer: Self-pay

## 2020-06-13 ENCOUNTER — Other Ambulatory Visit: Payer: Self-pay

## 2020-06-13 ENCOUNTER — Inpatient Hospital Stay (HOSPITAL_BASED_OUTPATIENT_CLINIC_OR_DEPARTMENT_OTHER): Payer: Self-pay | Admitting: Hematology

## 2020-06-13 ENCOUNTER — Encounter: Payer: Self-pay | Admitting: Hematology

## 2020-06-13 VITALS — BP 107/82 | HR 85 | Temp 97.5°F | Resp 16 | Ht 65.0 in | Wt 125.1 lb

## 2020-06-13 DIAGNOSIS — C155 Malignant neoplasm of lower third of esophagus: Secondary | ICD-10-CM

## 2020-06-13 DIAGNOSIS — C154 Malignant neoplasm of middle third of esophagus: Secondary | ICD-10-CM

## 2020-06-13 LAB — CBC WITH DIFFERENTIAL (CANCER CENTER ONLY)
Abs Immature Granulocytes: 0.04 10*3/uL (ref 0.00–0.07)
Basophils Absolute: 0 10*3/uL (ref 0.0–0.1)
Basophils Relative: 0 %
Eosinophils Absolute: 0.2 10*3/uL (ref 0.0–0.5)
Eosinophils Relative: 2 %
HCT: 31.5 % — ABNORMAL LOW (ref 36.0–46.0)
Hemoglobin: 10.2 g/dL — ABNORMAL LOW (ref 12.0–15.0)
Immature Granulocytes: 1 %
Lymphocytes Relative: 11 %
Lymphs Abs: 0.9 10*3/uL (ref 0.7–4.0)
MCH: 34.8 pg — ABNORMAL HIGH (ref 26.0–34.0)
MCHC: 32.4 g/dL (ref 30.0–36.0)
MCV: 107.5 fL — ABNORMAL HIGH (ref 80.0–100.0)
Monocytes Absolute: 1.3 10*3/uL — ABNORMAL HIGH (ref 0.1–1.0)
Monocytes Relative: 16 %
Neutro Abs: 5.9 10*3/uL (ref 1.7–7.7)
Neutrophils Relative %: 70 %
Platelet Count: 360 10*3/uL (ref 150–400)
RBC: 2.93 MIL/uL — ABNORMAL LOW (ref 3.87–5.11)
RDW: 13.8 % (ref 11.5–15.5)
WBC Count: 8.4 10*3/uL (ref 4.0–10.5)
nRBC: 0 % (ref 0.0–0.2)

## 2020-06-13 LAB — CMP (CANCER CENTER ONLY)
ALT: 14 U/L (ref 0–44)
AST: 17 U/L (ref 15–41)
Albumin: 3.2 g/dL — ABNORMAL LOW (ref 3.5–5.0)
Alkaline Phosphatase: 81 U/L (ref 38–126)
Anion gap: 8 (ref 5–15)
BUN: 10 mg/dL (ref 6–20)
CO2: 24 mmol/L (ref 22–32)
Calcium: 9.8 mg/dL (ref 8.9–10.3)
Chloride: 108 mmol/L (ref 98–111)
Creatinine: 0.67 mg/dL (ref 0.44–1.00)
GFR, Est AFR Am: >60 mL/min (ref >60)
GFR, Estimated: >60 mL/min (ref >60)
Glucose, Bld: 98 mg/dL (ref 70–99)
Potassium: 4 mmol/L (ref 3.5–5.1)
Sodium: 140 mmol/L (ref 135–145)
Total Bilirubin: 0.4 mg/dL (ref 0.3–1.2)
Total Protein: 6.7 g/dL (ref 6.5–8.1)

## 2020-06-13 LAB — IRON AND TIBC
Iron: 83 ug/dL (ref 41–142)
Saturation Ratios: 28 % (ref 21–57)
TIBC: 299 ug/dL (ref 236–444)
UIBC: 215 ug/dL (ref 120–384)

## 2020-06-13 LAB — VITAMIN B12: Vitamin B-12: 294 pg/mL (ref 180–914)

## 2020-06-13 LAB — FERRITIN: Ferritin: 412 ng/mL — ABNORMAL HIGH (ref 11–307)

## 2020-06-14 ENCOUNTER — Telehealth: Payer: Self-pay

## 2020-06-14 NOTE — Telephone Encounter (Signed)
Yesterday I called Dr. Florentina Jenny office and spoke with Judeen Hammans. Iasked if Natalie Morrison should still get her PET scan here scheduled for 9/13/202. Judeen Hammans returned my call and Dr. Florentina Jenny NP relayed that we should cancel the PET scan and Natalie Morrison will get scans done at [facility] prior to her surgery. Dr. Burr Medico notified  06/13/2020 ov note faxed to Dr. Elenor Quinones at [phone]

## 2020-06-14 NOTE — Telephone Encounter (Signed)
I spoke with MS Natalie Morrison and let her know that Dr. Elenor Quinones asked [place] to cancel her PET scan.  HE will order CT scans at [facility] prior to her surgery.  She verbalized understanding.

## 2020-06-14 NOTE — Telephone Encounter (Signed)
Another one.Marland KitchenMarland KitchenGardiner Rhyme

## 2020-06-14 NOTE — Telephone Encounter (Signed)
Lucianne Lei, Thanks for handling these.  Once they get caught up, I will try to send them to their provider.  Gardiner Rhyme, RN

## 2020-06-17 ENCOUNTER — Encounter (HOSPITAL_COMMUNITY): Payer: Self-pay

## 2020-06-17 ENCOUNTER — Telehealth: Payer: Self-pay

## 2020-06-17 NOTE — Telephone Encounter (Signed)
Spoke with pt concerning iron and  B12 lab results and new recommendations pt relays an understanding of new recommendations stating she will go and get the B12 supplements and begin taking nothing further call ended

## 2020-06-17 NOTE — Telephone Encounter (Signed)
-----   Message from Truitt Merle, MD sent at 06/15/2020  5:18 PM EDT ----- Please let pt know her iron level is good, but B12 slightly low, I recommend oral B12 1072mcg daily. If she is already on this dose, let her double the dose, thanks   Truitt Merle  06/15/2020

## 2020-06-26 ENCOUNTER — Other Ambulatory Visit: Payer: Self-pay | Admitting: Hematology

## 2020-06-30 ENCOUNTER — Encounter: Payer: Self-pay | Admitting: Family Medicine

## 2020-07-01 ENCOUNTER — Other Ambulatory Visit: Payer: Self-pay | Admitting: Nurse Practitioner

## 2020-07-11 ENCOUNTER — Other Ambulatory Visit: Payer: Self-pay | Admitting: Hematology

## 2020-08-11 ENCOUNTER — Other Ambulatory Visit: Payer: Self-pay | Admitting: Medical

## 2020-08-15 NOTE — Telephone Encounter (Signed)
Refill request

## 2020-08-19 ENCOUNTER — Inpatient Hospital Stay: Payer: Self-pay | Admitting: Hematology

## 2020-08-19 ENCOUNTER — Inpatient Hospital Stay: Payer: Self-pay | Attending: Hematology

## 2020-08-19 DIAGNOSIS — C155 Malignant neoplasm of lower third of esophagus: Secondary | ICD-10-CM

## 2020-10-30 ENCOUNTER — Telehealth: Payer: Self-pay | Admitting: Hematology

## 2020-10-30 NOTE — Telephone Encounter (Signed)
Returned call to schedule an appointment. Left message to call back to schedule.

## 2020-10-30 NOTE — Telephone Encounter (Signed)
Returned call to schedule an appointment per 1/26 schedule message. Sent message with request to provider.

## 2020-11-01 ENCOUNTER — Telehealth: Payer: Self-pay | Admitting: Hematology

## 2020-11-01 NOTE — Telephone Encounter (Signed)
Left message to schedule follow-up appointment per 1/26 staff message. Gave option to call back to schedule.

## 2020-11-05 NOTE — Progress Notes (Signed)
Left voice message for patient to call me back on my direct line to schedule a follow up with Dr. Burr Medico to discuss immunotherapy.

## 2020-11-06 ENCOUNTER — Telehealth: Payer: Self-pay

## 2020-11-06 NOTE — Telephone Encounter (Signed)
Patient calls back regarding scheduling a follow up with Dr. Burr Medico.  I have scheduled her for Friday 11/15/2020 9:30 am for labs and then f/u with Dr. Burr Medico at 10:00.  She is in agreement.

## 2020-11-13 NOTE — Progress Notes (Signed)
[facility]   [phone] [phone]   Clinic Follow up Note   Patient Care Team: Eulas Post, MD as PCP - General Truitt Merle, MD as Consulting Physician (Hematology) Michael Boston, MD as Consulting Physician (General Surgery) Kyung Rudd, MD as Consulting Physician (Radiation Oncology) Mauri Pole, MD as Consulting Physician (Gastroenterology) Jackelyn Knife, MD as Rounding Team (Internal Medicine) Karie Mainland, RD as Dietitian (Nutrition)  Date of Service:  11/15/2020  CHIEF COMPLAINT: F/u of esophageal cancer  SUMMARY OF ONCOLOGIC HISTORY: Oncology History Overview Note  Cancer Staging Esophageal cancer [facility]) Staging form: Esophagus - Other Histologies, AJCC 8th Edition - Clinical stage from 02/11/2020: cTX, cN1, cM0 - Signed by Truitt Merle, MD on 02/14/2020    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 Biopsied. - Food in the middle third of the esophagus. Removal was successful.   02/08/2020 Initial Biopsy   Diagnosis 02/08/20 Esophagus, biopsy, mass - INVASIVE SQUAMOUS CELL CARCINOMA - SEE COMMENT Microscopic Comment Based on the biopsy, the carcinoma appears moderately differentiated. Dr. Silverio Decamp was paged on Feb 09, 2020. Dr. Jeannie Done reviewed the case and agrees with the above diagnosis.    02/11/2020 Cancer Staging   Staging form: Esophagus - Other Histologies, AJCC 8th Edition - Clinical stage from 02/11/2020: cTX, cN1, cM0 - Signed by Truitt Merle, MD on 02/14/2020   02/14/2020 Initial Diagnosis   Esophageal cancer ([place])   02/14/2020 Imaging   CT CAP w contrast  IMPRESSION: 1. Irregular wall thickening and an ill-defined mass involving the distal esophagus consistent with known esophageal cancer. 2. Mildly  enlarged lymph nodes lateral to the gastroesophageal junction and in the gastrohepatic ligament, suspicious for metastatic disease. 3. No evidence of distant metastatic disease. 4. 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 5. Aortic Atherosclerosis (ICD10-I70.0).   03/18/2020 - 04/22/2020 Chemotherapy   Concurrent chemoRT with weekly carboplatin and Taxol starting 03/18/20-04/22/20   03/18/2020 - 04/25/2020 Radiation Therapy   Concurrent chemo RT with weekly CT by Dr Lisbeth Renshaw starting 03/18/20-04/25/20   07/04/2020 Imaging   CT Chest At [facility]  IMPRESSION:  1. Decreased mid and distal esophageal wall thickening with decreased size  of small lymph node adjacent to the upper esophagus and just distal to the  diaphragmatic hiatus.  2. Left lower lobe 65mm pulmonary nodule is newly visualized and  indeterminate. Attention on follow-up.  3. Please see same-day CT of the abdomen and pelvis for findings below the  diaphragm.    07/04/2020 Imaging   CT AP at [facility] Impression:  1. Decreased size of multiple previously seen enlarged perigastric lymph  nodes. No new lymphadenopathy or evidence of visceral metastatic disease  within the abdomen.   2. Please see same day chest CT for findings above the diaphragm.   07/22/2020 Surgery   ESOPHAGECTOMY, TOTAL/NEAR TOTAL, WITH THORACOSCOPIC MOBILIZATION OF MEDIASTINAL ESOPHAGUS, WITH CERVICAL ESOPHAGOGASTROSTOMY by Dr M'Amico at [facility]    07/22/2020 Pathology Results   DIAGNOSIS    A. Pleura, biopsy:   Fibromuscular tissue, negative for carcinoma.   B. Stomach and esophagus, esophagogastrectomy:   Invasive squamous cell carcinoma, moderately differentiated, 4.5 cm. See synoptic report.   C. Left gastric lymph node, excision:   Six lymph nodes, negative for metastatic carcinoma (0/6).   D. Lesser curvature lymph node,  excision:   Five lymph nodes, negative for metastatic carcinoma (0/5).   E. Final esophageal margin,  excision:   Portion of stomach and esophagus, negative for carcinoma or dysplasia.   F. Final gastric margin, excision:   Portion of stomach, negative for carcinoma or dysplasia.     07/22/2020 Cancer Staging   Staging form: Esophagus - Other Histologies, AJCC 8th Edition - Pathologic stage from 07/22/2020: pT3, pN0, cM0, G2 - Signed by Malachy Mood, MD on 08/18/2020   07/27/2020 Imaging   CT Chest at [facility]  IMPRESSION:  1.  Post surgical appearance of interval esophagectomy with gastric  pull-through. Locules of free air adjacent to the anastomosis favor  postsurgical given proximal in the surgical site however if there is  suspicion of leak consider a swallow study.  2.  Small to moderate loculated right hydropneumothorax with locules of are  posteriorly. Of not right chest tube within the fissure while the pleural  fluid is more posterior.  3.  Moderate left pleural effusion.        CURRENT THERAPY:  PENDING Nivolumab monthly for 1 year (first few treatment q2weeks)  INTERVAL HISTORY:  Natalie Morrison is here for a follow up after surgery. She presents to the clinic with her husband. She notes she has been able to regain her weight after surgery. She notes she her feeding tube was removed 10/15/20. She notes in the morning she uses nerve pill from [facility] and Tylenol to prevent cramping. She notes if she coughs deeply after a night of post nasal drip she will get nauseous. She notes she uses Claritin and Flonase. I reviewed her medication list with her. She notes her hiccups cause her deeper pain.     REVIEW OF SYSTEMS:   Constitutional: Denies fevers, chills or abnormal weight loss Eyes: Denies blurriness of vision Ears, nose, mouth, throat, and face: Denies mucositis or sore throat Respiratory: Denies cough, dyspnea or wheezes Cardiovascular: Denies palpitation, chest discomfort or lower extremity swelling Gastrointestinal:  Denies nausea, heartburn or change in bowel  habits Skin: Denies abnormal skin rashes Lymphatics: Denies new lymphadenopathy or easy bruising Neurological:Denies numbness, tingling or new weaknesses Behavioral/Psych: Mood is stable, no new changes  All other systems were reviewed with the patient and are negative.  MEDICAL HISTORY:  Past Medical History:  Diagnosis Date  . Alcohol abuse   . Anemia   . Anxiety   . Anxiety and depression   . Asthma   . Depression   . Hepatitis A    "when I was a kid"  . Hypertension     SURGICAL HISTORY: Past Surgical History:  Procedure Laterality Date  . BREAST LUMPECTOMY Right   . GASTROSTOMY N/A 02/22/2020   Procedure: OPEN PLACEMENT JEJUNOSTOMY FEEDING TUBE;  Surgeon: Diamantina Monks, MD;  Location: [facility] OR;  Service: General;  Laterality: N/A;  . IR REPLC DUODEN/JEJUNO TUBE PERCUT W/FLUORO  04/25/2020  . TONSILLECTOMY AND ADENOIDECTOMY Bilateral over 30 years ago  . UPPER GASTROINTESTINAL ENDOSCOPY      I have reviewed the social history and family history with the patient and they are unchanged from previous note.  ALLERGIES:  is allergic to nitrofurantoin, sulfa antibiotics, and sulfonamide derivatives.  MEDICATIONS:  Current Outpatient Medications  Medication Sig Dispense Refill  . acetaminophen (TYLENOL) 325 MG tablet Take 650 mg by mouth every 6 (six) hours as needed for mild pain or headache.    . Cyanocobalamin 1000 MCG/15ML LIQD Take 15 mLs (1,000 mcg total) by  mouth daily. 450 mL 1  . loratadine (CLARITIN) 10 MG tablet TAKE 1 TABLET BY MOUTH EVERY DAY 30 tablet 2   Current Facility-Administered Medications  Medication Dose Route Frequency Provider Last Rate Last Admin  . 0.9 %  sodium chloride infusion  500 mL Intravenous Once Nandigam, Venia Minks, MD       Facility-Administered Medications Ordered in Other Visits  Medication Dose Route Frequency Provider Last Rate Last Admin  . ondansetron (ZOFRAN) injection 8 mg  8 mg Intravenous Once Cira Rue K, NP      .  promethazine (PHENERGAN) injection 25 mg  25 mg Intravenous Once Cira Rue K, NP      . sodium chloride flush (NS) 0.9 % injection 10 mL  10 mL Intracatheter Once PRN Alla Feeling, NP        PHYSICAL EXAMINATION: ECOG PERFORMANCE STATUS: 1 - Symptomatic but completely ambulatory  Vitals:   11/15/20 1005  BP: 109/80  Pulse: 92  Resp: 16  Temp: 99 F (37.2 C)  SpO2: 98%   Filed Weights   11/15/20 1005  Weight: 124 lb 1.6 oz (56.3 kg)    GENERAL:alert, no distress and comfortable SKIN: skin color, texture, turgor are normal, no rashes or significant lesions EYES: normal, Conjunctiva are pink and non-injected, sclera clear  NECK: supple, thyroid normal size, non-tender, without nodularity LYMPH:  no palpable lymphadenopathy in the cervical, axillary  LUNGS: clear to auscultation and percussion with normal breathing effort HEART: regular rate & rhythm and no murmurs and no lower extremity edema ABDOMEN:abdomen soft, non-tender and normal bowel sounds (+) Surgical incisions of left supraclavicular, midline healed well  Musculoskeletal:no cyanosis of digits and no clubbing  NEURO: alert & oriented x 3 with fluent speech, no focal motor/sensory deficits  LABORATORY DATA:  I have reviewed the data as listed CBC Latest Ref Rng & Units 11/15/2020 06/13/2020 05/15/2020  WBC 4.0 - 10.5 K/uL 9.8 8.4 6.7  Hemoglobin 12.0 - 15.0 g/dL 13.9 10.2(L) 9.9(L)  Hematocrit 36.0 - 46.0 % 42.0 31.5(L) 30.0(L)  Platelets 150 - 400 K/uL 270 360 449(H)     CMP Latest Ref Rng & Units 11/15/2020 06/13/2020 05/15/2020  Glucose 70 - 99 mg/dL 149(H) 98 92  BUN 6 - 20 mg/dL $Remove'10 10 11  'lHJUPXJ$ Creatinine 0.44 - 1.00 mg/dL 0.67 0.67 0.54  Sodium 135 - 145 mmol/L 133(L) 140 136  Potassium 3.5 - 5.1 mmol/L 4.3 4.0 3.8  Chloride 98 - 111 mmol/L 103 108 106  CO2 22 - 32 mmol/L 24 24 21(L)  Calcium 8.9 - 10.3 mg/dL 9.2 9.8 9.0  Total Protein 6.5 - 8.1 g/dL 7.0 6.7 5.4(L)  Total Bilirubin 0.3 - 1.2 mg/dL 0.4 0.4 0.3   Alkaline Phos 38 - 126 U/L 147(H) 81 118  AST 15 - 41 U/L 27 17 12(L)  ALT 0 - 44 U/L $Remo'21 14 8      'FZWiI$ RADIOGRAPHIC STUDIES: I have personally reviewed the radiological images as listed and agreed with the findings in the report. No results found.   ASSESSMENT & PLAN:  SHENEKA SCHROM is a 50 y.o. female with    1. Esophagealsquamous cell carcinoma,in midesophagus, ypT3N0M0 -She was diagnosed in 02/2020.EGD showeda partially obstructing tumor, biopsy confirmedinvasive squamous cells carcinoma. Imaging indicates mildly enlarged LNs lateral to the GEJ.  -Her5/28/21 PET scan showedlocally advanced disease. -She completed concurrent chemoRT with weeklycarboplatin and Taxol 03/18/20- 04/25/20.  -She has decided to proceed with esophagectomy at [facility] on July 22, 2020. Surgical path showed 4.5cm of residual disease, which means complete response to neoadjuvant treatment was not achieved. This was complete removed with surgery. I reviewed with patient today. She still has high risk of recurrence.  -I discussed the recently published data of [place] 577 trial, which showed significant survival benefit of adjuvant nivolumab after neoadjuvant chemoradiation and esophagectomy (increase median OS from 11 months to 22.4 months). Given her residual disease on surgical path, I recommend 1 year adjuvant nivolumab monthly (first few treatments every 2 weeks). Benefit and side effects discussed with her, especially fatigue, pneumonitis, colitis, thyroid dysfunction or other endocrine dysfunction, and other autoimmune related disorders. I gave her print out of medication. She is interested.  Chemo consent obtained today.  The goal of therapy is curative. -She is recovering from surgery and treatment well. She is back to eating and ingesting by mouth. She is able to regain weight. She is overall strong enough to proceed with more treatment. She notes post nasal drip has lead to nausea intermittently. She  will continue Claritin daily and Flonase and inhaler.  -Labs reviewed, CBC and CMP WNL except MCV 104.2, sodium 133, BG 149, albumin 3.2, alk phos 147.  -Plan to start Nivolumab in 1-2 weeks.  She has no insurance, needs drug placement.  She opted to wait on [facility] placement if needed. Plan for next scan in March at [facility].  -F/u with second infusion.    2. Weight loss -With initial diagnosis she experienced dysphagia, odynophagia, weight loss, malnutrition, dehydration -Symptoms much improved after she completed chemoradiation and surgery. Her feeding tube was removed on 10/15/20.  -She is eating and taking medication all by mouth now. She has been able to regain weight after surgery.  -I recommend she continue smaller more frequent meals and use nutritional supplements daily.    3.Excessive alcohol usage and heavysmokinghistory,alcohol Cessation  -She has quit smoking 2 years ago and stopped using [place] 1 year ago. She rarely drinks alcohol now and stopped since her pain, but was drinking for 50 years.   4. B12 Deficiency  -She has been on oral B12 but has not been able to swallow due #1. She has restarted oral B12 after esophageal surgery. If not enough, will start B12 injections.    5. Social and Acupuncturist  -She is married with no children. She has in-laws in town -She has been unemployed since late 2019, she was working in a bank. Her husband is a bar tender -She currently does not have insurance, but is looking to get coverage. -F/uwithfinancial advocate to help apply for grants and coverage. She was not eligible for medicaid.  -Will apply for drug replacement with [facility]   PLAN: -Lab and Nivolumab in 1-2 weeks and then 2 weeks afterward  -F/u same day of second infusion.    No problem-specific Assessment & Plan notes found for this encounter.   No orders of the defined types were placed in this encounter.  All questions were answered. The patient knows  to call the clinic with any problems, questions or concerns. No barriers to learning was detected. The total time spent in the appointment was 40 minutes.     Truitt Merle, MD 11/15/2020   I, Joslyn Devon, am acting as scribe for Truitt Merle, MD.   I have reviewed the above documentation for accuracy and completeness, and I agree with the above.

## 2020-11-15 ENCOUNTER — Other Ambulatory Visit: Payer: Self-pay

## 2020-11-15 ENCOUNTER — Inpatient Hospital Stay (HOSPITAL_BASED_OUTPATIENT_CLINIC_OR_DEPARTMENT_OTHER): Payer: Self-pay | Admitting: Hematology

## 2020-11-15 ENCOUNTER — Inpatient Hospital Stay: Payer: Self-pay | Attending: Hematology

## 2020-11-15 ENCOUNTER — Encounter: Payer: Self-pay | Admitting: Hematology

## 2020-11-15 ENCOUNTER — Telehealth: Payer: Self-pay | Admitting: Hematology

## 2020-11-15 VITALS — BP 109/80 | HR 92 | Temp 99.0°F | Resp 16 | Ht 65.0 in | Wt 124.1 lb

## 2020-11-15 DIAGNOSIS — C155 Malignant neoplasm of lower third of esophagus: Secondary | ICD-10-CM | POA: Insufficient documentation

## 2020-11-15 DIAGNOSIS — E538 Deficiency of other specified B group vitamins: Secondary | ICD-10-CM | POA: Insufficient documentation

## 2020-11-15 DIAGNOSIS — C154 Malignant neoplasm of middle third of esophagus: Secondary | ICD-10-CM

## 2020-11-15 DIAGNOSIS — Z79899 Other long term (current) drug therapy: Secondary | ICD-10-CM | POA: Insufficient documentation

## 2020-11-15 DIAGNOSIS — Z5112 Encounter for antineoplastic immunotherapy: Secondary | ICD-10-CM | POA: Insufficient documentation

## 2020-11-15 DIAGNOSIS — R634 Abnormal weight loss: Secondary | ICD-10-CM | POA: Insufficient documentation

## 2020-11-15 LAB — CBC WITH DIFFERENTIAL (CANCER CENTER ONLY)
Abs Immature Granulocytes: 0.08 10*3/uL — ABNORMAL HIGH (ref 0.00–0.07)
Basophils Absolute: 0.1 10*3/uL (ref 0.0–0.1)
Basophils Relative: 1 %
Eosinophils Absolute: 0.1 10*3/uL (ref 0.0–0.5)
Eosinophils Relative: 1 %
HCT: 42 % (ref 36.0–46.0)
Hemoglobin: 13.9 g/dL (ref 12.0–15.0)
Immature Granulocytes: 1 %
Lymphocytes Relative: 8 %
Lymphs Abs: 0.8 10*3/uL (ref 0.7–4.0)
MCH: 34.5 pg — ABNORMAL HIGH (ref 26.0–34.0)
MCHC: 33.1 g/dL (ref 30.0–36.0)
MCV: 104.2 fL — ABNORMAL HIGH (ref 80.0–100.0)
Monocytes Absolute: 1.4 10*3/uL — ABNORMAL HIGH (ref 0.1–1.0)
Monocytes Relative: 14 %
Neutro Abs: 7.3 10*3/uL (ref 1.7–7.7)
Neutrophils Relative %: 75 %
Platelet Count: 270 10*3/uL (ref 150–400)
RBC: 4.03 MIL/uL (ref 3.87–5.11)
RDW: 14.3 % (ref 11.5–15.5)
WBC Count: 9.8 10*3/uL (ref 4.0–10.5)
nRBC: 0 % (ref 0.0–0.2)

## 2020-11-15 LAB — CMP (CANCER CENTER ONLY)
ALT: 21 U/L (ref 0–44)
AST: 27 U/L (ref 15–41)
Albumin: 3.2 g/dL — ABNORMAL LOW (ref 3.5–5.0)
Alkaline Phosphatase: 147 U/L — ABNORMAL HIGH (ref 38–126)
Anion gap: 6 (ref 5–15)
BUN: 10 mg/dL (ref 6–20)
CO2: 24 mmol/L (ref 22–32)
Calcium: 9.2 mg/dL (ref 8.9–10.3)
Chloride: 103 mmol/L (ref 98–111)
Creatinine: 0.67 mg/dL (ref 0.44–1.00)
GFR, Estimated: >60 mL/min (ref >60)
Glucose, Bld: 149 mg/dL — ABNORMAL HIGH (ref 70–99)
Potassium: 4.3 mmol/L (ref 3.5–5.1)
Sodium: 133 mmol/L — ABNORMAL LOW (ref 135–145)
Total Bilirubin: 0.4 mg/dL (ref 0.3–1.2)
Total Protein: 7 g/dL (ref 6.5–8.1)

## 2020-11-15 LAB — IRON AND TIBC
Iron: 83 ug/dL (ref 41–142)
Saturation Ratios: 28 % (ref 21–57)
TIBC: 296 ug/dL (ref 236–444)
UIBC: 213 ug/dL (ref 120–384)

## 2020-11-15 LAB — FERRITIN: Ferritin: 146 ng/mL (ref 11–307)

## 2020-11-15 LAB — VITAMIN B12: Vitamin B-12: 458 pg/mL (ref 180–914)

## 2020-11-15 NOTE — Progress Notes (Signed)
DISCONTINUE ON PATHWAY REGIMEN - Gastroesophageal     Administer weekly during RT:     Paclitaxel      Carboplatin   **Always confirm dose/schedule in your pharmacy ordering system**  REASON: Other Reason PRIOR TREATMENT: [id number]: Carboplatin + Paclitaxel (2/50) Weekly (x 5-6 Weeks) with Concurrent RT TREATMENT RESPONSE: Partial Response (PR)  START ON PATHWAY REGIMEN - Gastroesophageal     Cycles 1 through 8: A cycle is every 14 days:     Nivolumab    Cycles 9 and beyond: A cycle is every 28 days:     Nivolumab   **Always confirm dose/schedule in your pharmacy ordering system**  Patient Characteristics: Esophageal & GE Junction, Squamous Cell, Post-Neoadjuvant Therapy and Resection (Neoadjuvant Pathologic Staging), ypT+ or ypN+ Histology: Squamous Cell Disease Classification: Esophageal Therapeutic Status: Post-Neoadjuvant Therapy and Resection (Neoadjuvant Pathologic Staging) AJCC M Category: cM0 AJCC 8 Stage Grouping: II AJCC N Category: ypN0 AJCC Grade: G2 AJCC T Category: ypT3 AJCC Location: [place] Intent of Therapy: Curative Intent, Discussed with Patient

## 2020-11-15 NOTE — Progress Notes (Signed)
Pharmacist Chemotherapy Monitoring - Initial Assessment    Anticipated start date: 11/22/20  Regimen:  . Are orders appropriate based on the patient's diagnosis, regimen, and cycle? Yes . Does the plan date match the patient's scheduled date? Yes . Is the sequencing of drugs appropriate? Yes . Are the premedications appropriate for the patient's regimen? Yes . Prior Authorization for treatment is: Uninsured o If applicable, is the correct biosimilar selected based on the patient's insurance? not applicable  Organ Function and Labs: Marland Kitchen Are dose adjustments needed based on the patient's renal function, hepatic function, or hematologic function? Yes . Are appropriate labs ordered prior to the start of patient's treatment? Yes . Other organ system assessment, if indicated: N/A . The following baseline labs, if indicated, have been ordered: nivolumab: baseline TSH +/- T4  Dose Assessment: . Are the drug doses appropriate? Yes . Are the following correct: o Drug concentrations Yes o IV fluid compatible with drug Yes o Administration routes Yes o Timing of therapy Yes . If applicable, does the patient have documented access for treatment and/or plans for port-a-cath placement? no . If applicable, have lifetime cumulative doses been properly documented and assessed? yes Lifetime Dose Tracking  . Carboplatin: 1,320 mg = 0.01 % of the maximum lifetime dose of 999,999,999 mg  o   Toxicity Monitoring/Prevention: . The patient has the following take home antiemetics prescribed: Prochlorperazine . The patient has the following take home medications prescribed: N/A . Medication allergies and previous infusion related reactions, if applicable, have been reviewed and addressed. Yes . The patient's current medication list has been assessed for drug-drug interactions with their chemotherapy regimen. no significant drug-drug interactions were identified on review.  Order Review: . Are the treatment  plan orders signed? Yes . Is the patient scheduled to see a provider prior to their treatment? No  I verify that I have reviewed each item in the above checklist and answered each question accordingly.  Philomena Course, [place], 11/15/2020  1:12 PM

## 2020-11-15 NOTE — Telephone Encounter (Signed)
Scheduled appointments per 2/11 los. Spoke to patient who is aware of appointments dates and times. Gave patient calendar print out.  °

## 2020-11-19 NOTE — Progress Notes (Signed)
..  The following Medication: Natalie Morrison has been approved thru [employer] as Assistance Program. Enrollment period is 11/19/2021 to 11/18/2021.  Assistance ID: [id number]. Reason for Assistance: Self Pay First DOS: 11/22/2020  .Juan Quam, CPhT IV Drug Replacement Specialist [place] Phone: [phone]

## 2020-11-22 ENCOUNTER — Inpatient Hospital Stay: Payer: Self-pay

## 2020-11-22 ENCOUNTER — Other Ambulatory Visit: Payer: Self-pay

## 2020-11-22 VITALS — BP 117/74 | HR 76 | Temp 98.6°F | Resp 18 | Ht 65.0 in | Wt 123.8 lb

## 2020-11-22 DIAGNOSIS — C154 Malignant neoplasm of middle third of esophagus: Secondary | ICD-10-CM

## 2020-11-22 DIAGNOSIS — C155 Malignant neoplasm of lower third of esophagus: Secondary | ICD-10-CM

## 2020-11-22 LAB — CMP (CANCER CENTER ONLY)
ALT: 10 U/L (ref 0–44)
AST: 15 U/L (ref 15–41)
Albumin: 2.9 g/dL — ABNORMAL LOW (ref 3.5–5.0)
Alkaline Phosphatase: 155 U/L — ABNORMAL HIGH (ref 38–126)
Anion gap: 10 (ref 5–15)
BUN: 10 mg/dL (ref 6–20)
CO2: 21 mmol/L — ABNORMAL LOW (ref 22–32)
Calcium: 8.9 mg/dL (ref 8.9–10.3)
Chloride: 106 mmol/L (ref 98–111)
Creatinine: 0.6 mg/dL (ref 0.44–1.00)
GFR, Estimated: >60 mL/min (ref >60)
Glucose, Bld: 101 mg/dL — ABNORMAL HIGH (ref 70–99)
Potassium: 4 mmol/L (ref 3.5–5.1)
Sodium: 137 mmol/L (ref 135–145)
Total Bilirubin: 0.4 mg/dL (ref 0.3–1.2)
Total Protein: 6.7 g/dL (ref 6.5–8.1)

## 2020-11-22 LAB — CBC WITH DIFFERENTIAL (CANCER CENTER ONLY)
Abs Immature Granulocytes: 0.04 10*3/uL (ref 0.00–0.07)
Basophils Absolute: 0.1 10*3/uL (ref 0.0–0.1)
Basophils Relative: 1 %
Eosinophils Absolute: 0.1 10*3/uL (ref 0.0–0.5)
Eosinophils Relative: 1 %
HCT: 36.3 % (ref 36.0–46.0)
Hemoglobin: 12 g/dL (ref 12.0–15.0)
Immature Granulocytes: 0 %
Lymphocytes Relative: 10 %
Lymphs Abs: 0.9 10*3/uL (ref 0.7–4.0)
MCH: 34.5 pg — ABNORMAL HIGH (ref 26.0–34.0)
MCHC: 33.1 g/dL (ref 30.0–36.0)
MCV: 104.3 fL — ABNORMAL HIGH (ref 80.0–100.0)
Monocytes Absolute: 1.7 10*3/uL — ABNORMAL HIGH (ref 0.1–1.0)
Monocytes Relative: 18 %
Neutro Abs: 6.3 10*3/uL (ref 1.7–7.7)
Neutrophils Relative %: 70 %
Platelet Count: 303 10*3/uL (ref 150–400)
RBC: 3.48 MIL/uL — ABNORMAL LOW (ref 3.87–5.11)
RDW: 13.4 % (ref 11.5–15.5)
WBC Count: 9.1 10*3/uL (ref 4.0–10.5)
nRBC: 0 % (ref 0.0–0.2)

## 2020-11-22 LAB — VITAMIN B12: Vitamin B-12: 557 pg/mL (ref 180–914)

## 2020-11-22 MED ORDER — SODIUM CHLORIDE 0.9 % IV SOLN
Freq: Once | INTRAVENOUS | Status: AC
  Filled 2020-11-22: qty 250

## 2020-11-22 MED ORDER — NIVOLUMAB CHEMO INJECTION 100 MG/10ML
240.0000 mg | Freq: Once | INTRAVENOUS | Status: AC
  Administered 2020-11-22: 240 mg via INTRAVENOUS
  Filled 2020-11-22: qty 24

## 2020-11-22 NOTE — Patient Instructions (Addendum)
[facility] Discharge Instructions for Patients Receiving Chemotherapy  Today you received the following chemotherapy agents Nivolumab (OPDIVO).  To help prevent nausea and vomiting after your treatment, we encourage you to take your nausea medication as prescribed.   If you develop nausea and vomiting that is not controlled by your nausea medication, call the clinic.   BELOW ARE SYMPTOMS THAT SHOULD BE REPORTED IMMEDIATELY:  *FEVER GREATER THAN 100.5 F  *CHILLS WITH OR WITHOUT FEVER  NAUSEA AND VOMITING THAT IS NOT CONTROLLED WITH YOUR NAUSEA MEDICATION  *UNUSUAL SHORTNESS OF BREATH  *UNUSUAL BRUISING OR BLEEDING  TENDERNESS IN MOUTH AND THROAT WITH OR WITHOUT PRESENCE OF ULCERS  *URINARY PROBLEMS  *BOWEL PROBLEMS  UNUSUAL RASH Items with * indicate a potential emergency and should be followed up as soon as possible.  Feel free to call the clinic should you have any questions or concerns. The clinic phone number is [phone].  Please show the [place] at check-in to the Emergency Department and triage nurse.  Nivolumab injection What is this medicine? NIVOLUMAB (nye VOL ue mab) is a monoclonal antibody. It treats certain types of cancer. Some of the cancers treated are colon cancer, head and neck cancer, Hodgkin lymphoma, lung cancer, and melanoma. This medicine may be used for other purposes; ask your health care provider or pharmacist if you have questions. COMMON BRAND NAME(S): Opdivo What should I tell my health care provider before I take this medicine? They need to know if you have any of these conditions:  autoimmune diseases like Crohn's disease, ulcerative colitis, or lupus  have had or planning to have an allogeneic stem cell transplant (uses someone else's stem cells)  history of chest radiation  history of organ transplant  nervous system problems like myasthenia gravis or Guillain-Barre syndrome  an unusual or allergic  reaction to nivolumab, other medicines, foods, dyes, or preservatives  pregnant or trying to get pregnant  breast-feeding How should I use this medicine? This medicine is for infusion into a vein. It is given by a health care professional in a hospital or clinic setting. A special MedGuide will be given to you before each treatment. Be sure to read this information carefully each time. Talk to your pediatrician regarding the use of this medicine in children. While this drug may be prescribed for children as young as 12 years for selected conditions, precautions do apply. Overdosage: If you think you have taken too much of this medicine contact a poison control center or emergency room at once. NOTE: This medicine is only for you. Do not share this medicine with others. What if I miss a dose? It is important not to miss your dose. Call your doctor or health care professional if you are unable to keep an appointment. What may interact with this medicine? Interactions have not been studied. This list may not describe all possible interactions. Give your health care provider a list of all the medicines, herbs, non-prescription drugs, or dietary supplements you use. Also tell them if you smoke, drink alcohol, or use illegal drugs. Some items may interact with your medicine. What should I watch for while using this medicine? This drug may make you feel generally unwell. Continue your course of treatment even though you feel ill unless your doctor tells you to stop. You may need blood work done while you are taking this medicine. Do not become pregnant while taking this medicine or for 5 months after stopping it. Women should inform  their doctor if they wish to become pregnant or think they might be pregnant. There is a potential for serious side effects to an unborn child. Talk to your health care professional or pharmacist for more information. Do not breast-feed an infant while taking this medicine or  for 5 months after stopping it. What side effects may I notice from receiving this medicine? Side effects that you should report to your doctor or health care professional as soon as possible:  allergic reactions like skin rash, itching or hives, swelling of the face, lips, or tongue  breathing problems  blood in the urine  bloody or watery diarrhea or black, tarry stools  changes in emotions or moods  changes in vision  chest pain  cough  dizziness  feeling faint or lightheaded, falls  fever, chills  headache with fever, neck stiffness, confusion, loss of memory, sensitivity to light, hallucination, loss of contact with reality, or seizures  joint pain  mouth sores  redness, blistering, peeling or loosening of the skin, including inside the mouth  severe muscle pain or weakness  signs and symptoms of high blood sugar such as dizziness; dry mouth; dry skin; fruity breath; nausea; stomach pain; increased hunger or thirst; increased urination  signs and symptoms of kidney injury like trouble passing urine or change in the amount of urine  signs and symptoms of liver injury like dark yellow or brown urine; general ill feeling or flu-like symptoms; light-colored stools; loss of appetite; nausea; right upper belly pain; unusually weak or tired; yellowing of the eyes or skin  swelling of the ankles, feet, hands  trouble passing urine or change in the amount of urine  unusually weak or tired  weight gain or loss Side effects that usually do not require medical attention (report to your doctor or health care professional if they continue or are bothersome):  bone pain  constipation  decreased appetite  diarrhea  muscle pain  nausea, vomiting  tiredness This list may not describe all possible side effects. Call your doctor for medical advice about side effects. You may report side effects to FDA at 1-800-FDA-1088. Where should I keep my medicine? This drug is  given in a hospital or clinic and will not be stored at home. NOTE: This sheet is a summary. It may not cover all possible information. If you have questions about this medicine, talk to your doctor, pharmacist, or health care provider.  2021 Elsevier/Gold Standard (2020-01-24 10:08:25)

## 2020-11-25 ENCOUNTER — Telehealth: Payer: Self-pay | Admitting: *Deleted

## 2020-11-25 LAB — IRON AND TIBC
Iron: 59 ug/dL (ref 41–142)
Saturation Ratios: 22 % (ref 21–57)
TIBC: 261 ug/dL (ref 236–444)
UIBC: 203 ug/dL (ref 120–384)

## 2020-11-25 LAB — TSH: TSH: 0.965 u[IU]/mL (ref 0.308–3.960)

## 2020-11-25 LAB — FERRITIN: Ferritin: 151 ng/mL (ref 11–307)

## 2020-11-25 NOTE — Telephone Encounter (Signed)
Called pt to see how she did with her treatment & she reports not doing well.  She states that her chest feels swollen inside & therefore achey.  She rates as pain # 7-8 & describes as pressure throughout her chest.  She is also feeling tired.  She has taken some tylenol but states that she thinks a steroid might help her.  She gets sick when she coughs up phlegm which is white & thick & this will make her vomit.  She stated no appetite & eating poorly but is drinking fluids.  Discussed protein drinks & she says she has some in the refridge & will try.  Message routed to Dr Feng/Pod RN.

## 2020-11-25 NOTE — Telephone Encounter (Signed)
-----   Message from Georgianne Fick, RN sent at 11/22/2020  4:24 PM EST ----- Regarding: Dr. Burr Medico First Time Chemo Patient received first time Nivolumab (OPDIVO) today and tolerated this well.

## 2020-11-27 ENCOUNTER — Other Ambulatory Visit: Payer: Self-pay

## 2020-11-27 ENCOUNTER — Telehealth: Payer: Self-pay

## 2020-11-27 DIAGNOSIS — R110 Nausea: Secondary | ICD-10-CM

## 2020-11-27 MED ORDER — PROCHLORPERAZINE MALEATE 10 MG PO TABS: 10.0000 mg | ORAL_TABLET | Freq: Four times a day (QID) | ORAL | 1 refills | Status: AC | PRN

## 2020-11-27 NOTE — Telephone Encounter (Signed)
Natalie Morrison called today stating "I have 6 out of the 7 no emergent side effects from opdivo".  Specifically she states she has nausea and vomiting, bone and muscle pain, pain at the previous chests tube site, diarrhea yesterday only, fatigue and decreased appetite.  She also states she is have chest tightness, no dyspnea.  She states she wants "steriods".  She has been taking tylenol every 4 hours, she takes liquid and is unsure of the dose. I told her to only take tylenol every 6 hours. The tylenol is not helping the pain.  I instructed her to try ibuprofen.  Compazine prescription sent [facility] her [facility].  I recommended she take this every 6 hours of today and tomorrow and then as needed. She verbalized understanding.

## 2020-11-28 ENCOUNTER — Telehealth: Payer: Self-pay

## 2020-11-28 NOTE — Telephone Encounter (Signed)
I spoke with Natalie Morrison today.  She is feeling better now tht she is taking compazine and ibuprofen.  She is able to eat and drink.

## 2020-12-04 NOTE — Progress Notes (Signed)
Natalie Morrison   [phone] Fax:([phone]) [phone]   Clinic Follow up Note   Patient Care Team: Eulas Post, MD as PCP - General Truitt Merle, MD as Consulting Physician (Hematology) Michael Boston, MD as Consulting Physician (General Surgery) Kyung Rudd, MD as Consulting Physician (Radiation Oncology) Mauri Pole, MD as Consulting Physician (Gastroenterology) Jackelyn Knife, MD as Rounding Team (Internal Medicine) Karie Mainland, RD as Dietitian (Nutrition)  Date of Service:  12/06/2020  CHIEF COMPLAINT: F/u of esophageal cancer  SUMMARY OF ONCOLOGIC HISTORY: Oncology History Overview Note  Cancer Staging Esophageal cancer [facility]) Staging form: Esophagus - Other Histologies, AJCC 8th Edition - Clinical stage from 02/11/2020: cTX, cN1, cM0 - Signed by Truitt Merle, MD on 02/14/2020    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 Biopsied. - Food in the middle third of the esophagus. Removal was successful.   02/08/2020 Initial Biopsy   Diagnosis 02/08/20 Esophagus, biopsy, mass - INVASIVE SQUAMOUS CELL CARCINOMA - SEE COMMENT Microscopic Comment Based on the biopsy, the carcinoma appears moderately differentiated. Dr. Silverio Decamp was paged on Feb 09, 2020. Dr. Jeannie Done reviewed the case and agrees with the above diagnosis.    02/11/2020 Cancer Staging   Staging form: Esophagus - Other Histologies, AJCC 8th Edition - Clinical stage from 02/11/2020: cTX, cN1, cM0 - Signed by Truitt Merle, MD on 02/14/2020   02/14/2020 Initial Diagnosis   Esophageal cancer ([place])   02/14/2020 Imaging   CT CAP w contrast  IMPRESSION: 1. Irregular wall thickening and an ill-defined mass involving the distal esophagus consistent with known esophageal cancer. 2. Mildly enlarged  lymph nodes lateral to the gastroesophageal junction and in the gastrohepatic ligament, suspicious for metastatic disease. 3. No evidence of distant metastatic disease. 4. 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 5. Aortic Atherosclerosis (ICD10-I70.0).   03/18/2020 - 04/22/2020 Chemotherapy   Concurrent chemoRT with weekly carboplatin and Taxol starting 03/18/20-04/22/20   03/18/2020 - 04/25/2020 Radiation Therapy   Concurrent chemo RT with weekly CT by Dr Lisbeth Renshaw starting 03/18/20-04/25/20   07/04/2020 Imaging   CT Chest At [facility]  IMPRESSION:  1. Decreased mid and distal esophageal wall thickening with decreased size  of small lymph node adjacent to the upper esophagus and just distal to the  diaphragmatic hiatus.  2. Left lower lobe 65mm pulmonary nodule is newly visualized and  indeterminate. Attention on follow-up.  3. Please see same-day CT of the abdomen and pelvis for findings below the  diaphragm.    07/04/2020 Imaging   CT AP at [facility] Impression:  1. Decreased size of multiple previously seen enlarged perigastric lymph  nodes. No new lymphadenopathy or evidence of visceral metastatic disease  within the abdomen.   2. Please see same day chest CT for findings above the diaphragm.   07/22/2020 Surgery   ESOPHAGECTOMY, TOTAL/NEAR TOTAL, WITH THORACOSCOPIC MOBILIZATION OF MEDIASTINAL ESOPHAGUS, WITH CERVICAL ESOPHAGOGASTROSTOMY by Dr M'Amico at [facility]    07/22/2020 Pathology Results   DIAGNOSIS    A. Pleura, biopsy:   Fibromuscular tissue, negative for carcinoma.   B. Stomach and esophagus, esophagogastrectomy:   Invasive squamous cell carcinoma, moderately differentiated, 4.5 cm. See synoptic report.   C. Left gastric lymph node, excision:   Six lymph nodes, negative for metastatic carcinoma (0/6).   D. Lesser curvature lymph node,  excision:   Five lymph nodes, negative for metastatic carcinoma (0/5).   E. Final esophageal margin,  excision:   Portion of stomach and esophagus, negative for carcinoma or dysplasia.   F. Final gastric margin, excision:   Portion of stomach, negative for carcinoma or dysplasia.     07/22/2020 Cancer Staging   Staging form: Esophagus - Other Histologies, AJCC 8th Edition - Pathologic stage from 07/22/2020: pT3, pN0, cM0, G2 - Signed by Truitt Merle, MD on 08/18/2020   07/27/2020 Imaging   CT Chest at [facility]  IMPRESSION:  1.  Post surgical appearance of interval esophagectomy with gastric  pull-through. Locules of free air adjacent to the anastomosis favor  postsurgical given proximal in the surgical site however if there is  suspicion of leak consider a swallow study.  2.  Small to moderate loculated right hydropneumothorax with locules of are  posteriorly. Of not right chest tube within the fissure while the pleural  fluid is more posterior.  3.  Moderate left pleural effusion.     11/22/2020 -  Chemotherapy    Patient is on Treatment Plan: GASTROESOPHAGEAL NIVOLUMAB Q14D X 8 CYCLES / NIVOLUMAB Q28D         CURRENT THERAPY:  Nivolumab monthly starting 11/22/20 for 1 year (first few treatment q2weeks)  INTERVAL HISTORY:  Natalie Morrison is here for a follow up. She presents to the clinic with her fmaily member. She notes she had chills last night and this morning she currently feels warm. She has fever of at least 100.73F. She notes she has been coughing for the past 2 weeks and with deep breathing she has chest pain. She takes motrin for the pain. She notes more SOB from her asthma which is attributes to allergies. She notes she has N&V at least once daily. She notes she plans to have scan at [facility] on 3/8.      REVIEW OF SYSTEMS:   Constitutional: Denies fevers, chills or abnormal weight loss Eyes: Denies blurriness of vision Ears, nose, mouth, throat, and face: Denies mucositis or sore throat Respiratory: Denies cough, dyspnea or wheezes Cardiovascular: Denies palpitation,  chest discomfort or lower extremity swelling Gastrointestinal:  Denies nausea, heartburn or change in bowel habits Skin: Denies abnormal skin rashes Lymphatics: Denies new lymphadenopathy or easy bruising Neurological:Denies numbness, tingling or new weaknesses Behavioral/Psych: Mood is stable, no new changes  All other systems were reviewed with the patient and are negative.  MEDICAL HISTORY:  Past Medical History:  Diagnosis Date  . Alcohol abuse   . Anemia   . Anxiety   . Anxiety and depression   . Asthma   . Depression   . Hepatitis A    "when I was a kid"  . Hypertension     SURGICAL HISTORY: Past Surgical History:  Procedure Laterality Date  . BREAST LUMPECTOMY Right   . GASTROSTOMY N/A 02/22/2020   Procedure: OPEN PLACEMENT JEJUNOSTOMY FEEDING TUBE;  Surgeon: Jesusita Oka, MD;  Location: [place];  Service: General;  Laterality: N/A;  . IR [place] DUODEN/JEJUNO TUBE PERCUT W/FLUORO  04/25/2020  . TONSILLECTOMY AND ADENOIDECTOMY Bilateral over 30 years ago  . UPPER GASTROINTESTINAL ENDOSCOPY      I have reviewed the social history and family history with the patient and they are unchanged from previous note.  ALLERGIES:  is allergic to nitrofurantoin, sulfa antibiotics, and sulfonamide derivatives.  MEDICATIONS:  Current Outpatient Medications  Medication Sig Dispense Refill  . amoxicillin-clavulanate (AUGMENTIN) 875-125 MG tablet Take 1  tablet by mouth 2 (two) times daily. 14 tablet 0  . acetaminophen (TYLENOL) 325 MG tablet Take 650 mg by mouth every 6 (six) hours as needed for mild pain or headache.    . Cyanocobalamin 1000 MCG/15ML LIQD Take 15 mLs (1,000 mcg total) by mouth daily. 450 mL 1  . loratadine (CLARITIN) 10 MG tablet TAKE 1 TABLET BY MOUTH EVERY DAY 30 tablet 2  . prochlorperazine (COMPAZINE) 10 MG tablet Take 1 tablet (10 mg total) by mouth every 6 (six) hours as needed for nausea or vomiting. 30 tablet 1   Current Facility-Administered Medications   Medication Dose Route Frequency Provider Last Rate Last Admin  . 0.9 %  sodium chloride infusion  500 mL Intravenous Once Nandigam, Venia Minks, MD       Facility-Administered Medications Ordered in Other Visits  Medication Dose Route Frequency Provider Last Rate Last Admin  . ondansetron (ZOFRAN) injection 8 mg  8 mg Intravenous Once Cira Rue K, NP      . promethazine (PHENERGAN) injection 25 mg  25 mg Intravenous Once Cira Rue K, NP      . sodium chloride flush (NS) 0.9 % injection 10 mL  10 mL Intracatheter Once PRN Alla Feeling, NP        PHYSICAL EXAMINATION: ECOG PERFORMANCE STATUS: 2 - Symptomatic, <50% confined to bed  Vitals:   12/06/20 1002  BP: 130/83  Pulse: (!) 133  Resp: 18  Temp: (!) 101.5 F (38.6 C)   Filed Weights   12/06/20 1002  Weight: 124 lb (56.2 kg)    GENERAL:alert, no distress and comfortable SKIN: skin color, texture, turgor are normal, no rashes or significant lesions EYES: normal, Conjunctiva are pink and non-injected, sclera clear  NECK: supple, thyroid normal size, non-tender, without nodularity LYMPH:  no palpable lymphadenopathy in the cervical, axillary  LUNGS: clear to auscultation and percussion with normal breathing effort HEART: regular rate & rhythm and no murmurs and no lower extremity edema ABDOMEN:abdomen soft, non-tender and normal bowel sounds Musculoskeletal:no cyanosis of digits and no clubbing  NEURO: alert & oriented x 3 with fluent speech, no focal motor/sensory deficits  LABORATORY DATA:  I have reviewed the data as listed CBC Latest Ref Rng & Units 12/06/2020 11/22/2020 11/15/2020  WBC 4.0 - 10.5 K/uL 14.4(H) 9.1 9.8  Hemoglobin 12.0 - 15.0 g/dL 12.3 12.0 13.9  Hematocrit 36.0 - 46.0 % 37.0 36.3 42.0  Platelets 150 - 400 K/uL 454(H) 303 270     CMP Latest Ref Rng & Units 12/06/2020 11/22/2020 11/15/2020  Glucose 70 - 99 mg/dL 105(H) 101(H) 149(H)  BUN 6 - 20 mg/dL 7 10 10   Creatinine 0.44 - 1.00 mg/dL 0.61 0.60  0.67  Sodium 135 - 145 mmol/L 139 137 133(L)  Potassium 3.5 - 5.1 mmol/L 3.3(L) 4.0 4.3  Chloride 98 - 111 mmol/L 100 106 103  CO2 22 - 32 mmol/L 25 21(L) 24  Calcium 8.9 - 10.3 mg/dL 8.9 8.9 9.2  Total Protein 6.5 - 8.1 g/dL 7.2 6.7 7.0  Total Bilirubin 0.3 - 1.2 mg/dL 0.5 0.4 0.4  Alkaline Phos 38 - 126 U/L 130(H) 155(H) 147(H)  AST 15 - 41 U/L 13(L) 15 27  ALT 0 - 44 U/L 7 10 21       RADIOGRAPHIC STUDIES: I have personally reviewed the radiological images as listed and agreed with the findings in the report. DG Chest 2 View  Result Date: 12/06/2020 CLINICAL DATA:  Cough for 1 month EXAM: CHEST -  2 VIEW COMPARISON:  08/24/2018, 02/14/2020 FINDINGS: Cardiac shadow is within normal limits. Lungs are well aerated bilaterally. Small bilateral pleural effusions are noted posteriorly. No acute bony abnormality is noted. IMPRESSION: Small effusions bilaterally. Electronically Signed   By: Inez Catalina M.D.   On: 12/06/2020 11:26     ASSESSMENT & PLAN:  Natalie Morrison is a 50 y.o. female with   1. Esophagealsquamous cell carcinoma,in midesophagus, ypT3N0M0 -She was diagnosed in 02/2020.EGD showeda partially obstructing tumor, biopsy confirmedinvasive squamous cells carcinoma. Imaging indicates mildly enlarged LNs lateral to the GEJ.  -Her5/28/21 PET scan showedlocally advanced disease. -She completedconcurrent chemoRT with weeklycarboplatin and Taxol 03/18/20-04/25/20.  -She has decided toproceed with [facility] on July [facility]. Surgical path showed 4.5cm of residual disease, which means complete response to neoadjuvant treatment was not achieved. This was complete removed with surgery. She still has high risk of recurrence.  -Based on recently published data of [place], I started her on 1 year adjuvant Nivolumab monthly (first few treatments every 2 weeks) beginning on 11/22/20.  -After First cycle she has worsened nausea now with vomiting and worsening  of her cough, SOB with fever. I discussed these are not common side effects of Nivo. I recommend holding treatment while we treat and manage this. She is agreeable.  -She will proceed with follow up scan at [facility] on 12/10/20.  I discussed Nivo can lead to Pneumonitis and can be seen on CT scan.  -Phone call on 3/11  2. Cough, SOB, N&V, Fever  -After surgery she started having post nasal drip and nausea (about 2 weeks ago). After start of her Nivolumab she notes this has worsened with Vomiting daily, SOB and cough with pain, chills last night and fever today (12/06/20). She also has muscle aches.  -Her breathing was adequate on exam today.  -She notes she had COVID series vaccine and COVID booster and Flu shot.  -I recommend chest Xray today (12/06/20). I advised her to use home COVID test. If negative she should get a PCR based test at local pharmacy in 3-4 days.  -I will call in Augmentin (12/06/20).  -She does plan to have CT Scan with [facility] on 3/8. I discussed Nivo can lead to Pneumonitis and can be seen on CT scan.  -We discussed this is likely acute bronchitis, I will also want to make sure she does not have nivolumab related pneumonitis  3.Excessive alcohol usage and heavysmokinghistory,alcohol Cessation  -She has quit smoking 2 years ago and stopped using [place] 1 year ago. She rarely drinks alcohol now and stopped since her pain, but was drinking for 50 years.   4. B12 Deficiency  -She has been on oral B12 but has not been able to swallow due #1. She has restarted oral B12 after esophageal surgery. If not enough, will start B12 injections.   5. Social and Acupuncturist  -She is married with no children. She has in-laws in town -She has been unemployed since late 2019, she was working in a bank. Her husband is a bar tender -She currently does not have insurance, but is looking to get coverage. -F/uwithfinancial advocate to help apply for grants and coverage. She was not  eligible for medicaid.  -Will apply for drug replacement with Nivo   PLAN: -will hold on Nivolumab today  -I called in Augmentin today for 7 days  -Chest X[facility] today at [facility]. It came back negative except a bilateral small pleural effusions. -She is scheduled  for CT scan and follow-up with her surgeon at [facility] next Tuesday -Phone call on 3/11 for follow-up   No problem-specific Assessment & Plan notes found for this encounter.   No orders of the defined types were placed in this encounter.  All questions were answered. The patient knows to call the clinic with any problems, questions or concerns. No barriers to learning was detected. The total time spent in the appointment was 30 minutes.     Truitt Merle, MD 12/06/2020   I, Joslyn Devon, am acting as scribe for Truitt Merle, MD.   I have reviewed the above documentation for accuracy and completeness, and I agree with the above.

## 2020-12-06 ENCOUNTER — Other Ambulatory Visit: Payer: Self-pay

## 2020-12-06 ENCOUNTER — Inpatient Hospital Stay: Payer: Self-pay

## 2020-12-06 ENCOUNTER — Ambulatory Visit (HOSPITAL_COMMUNITY)
Admission: RE | Admit: 2020-12-06 | Discharge: 2020-12-06 | Disposition: A | Discharge status: Home / Self Care | Payer: Self-pay | Source: Ambulatory Visit | Attending: Hematology | Admitting: Hematology

## 2020-12-06 ENCOUNTER — Inpatient Hospital Stay: Payer: Self-pay | Attending: Hematology

## 2020-12-06 ENCOUNTER — Encounter: Payer: Self-pay | Admitting: Hematology

## 2020-12-06 ENCOUNTER — Inpatient Hospital Stay (HOSPITAL_BASED_OUTPATIENT_CLINIC_OR_DEPARTMENT_OTHER): Payer: Self-pay | Admitting: Hematology

## 2020-12-06 ENCOUNTER — Telehealth: Payer: Self-pay

## 2020-12-06 VITALS — BP 130/83 | HR 133 | Temp 101.5°F | Resp 18 | Ht 65.0 in | Wt 124.0 lb

## 2020-12-06 DIAGNOSIS — C155 Malignant neoplasm of lower third of esophagus: Secondary | ICD-10-CM

## 2020-12-06 DIAGNOSIS — J45909 Unspecified asthma, uncomplicated: Secondary | ICD-10-CM | POA: Insufficient documentation

## 2020-12-06 DIAGNOSIS — Z9221 Personal history of antineoplastic chemotherapy: Secondary | ICD-10-CM | POA: Insufficient documentation

## 2020-12-06 DIAGNOSIS — E538 Deficiency of other specified B group vitamins: Secondary | ICD-10-CM | POA: Insufficient documentation

## 2020-12-06 DIAGNOSIS — R059 Cough, unspecified: Secondary | ICD-10-CM

## 2020-12-06 DIAGNOSIS — Z923 Personal history of irradiation: Secondary | ICD-10-CM | POA: Insufficient documentation

## 2020-12-06 DIAGNOSIS — I10 Essential (primary) hypertension: Secondary | ICD-10-CM | POA: Insufficient documentation

## 2020-12-06 DIAGNOSIS — Z79899 Other long term (current) drug therapy: Secondary | ICD-10-CM | POA: Insufficient documentation

## 2020-12-06 DIAGNOSIS — C154 Malignant neoplasm of middle third of esophagus: Secondary | ICD-10-CM

## 2020-12-06 LAB — CMP (CANCER CENTER ONLY)
ALT: 7 U/L (ref 0–44)
AST: 13 U/L — ABNORMAL LOW (ref 15–41)
Albumin: 2.8 g/dL — ABNORMAL LOW (ref 3.5–5.0)
Alkaline Phosphatase: 130 U/L — ABNORMAL HIGH (ref 38–126)
Anion gap: 14 (ref 5–15)
BUN: 7 mg/dL (ref 6–20)
CO2: 25 mmol/L (ref 22–32)
Calcium: 8.9 mg/dL (ref 8.9–10.3)
Chloride: 100 mmol/L (ref 98–111)
Creatinine: 0.61 mg/dL (ref 0.44–1.00)
GFR, Estimated: >60 mL/min (ref >60)
Glucose, Bld: 105 mg/dL — ABNORMAL HIGH (ref 70–99)
Potassium: 3.3 mmol/L — ABNORMAL LOW (ref 3.5–5.1)
Sodium: 139 mmol/L (ref 135–145)
Total Bilirubin: 0.5 mg/dL (ref 0.3–1.2)
Total Protein: 7.2 g/dL (ref 6.5–8.1)

## 2020-12-06 LAB — CBC WITH DIFFERENTIAL (CANCER CENTER ONLY)
Abs Immature Granulocytes: 0.13 10*3/uL — ABNORMAL HIGH (ref 0.00–0.07)
Basophils Absolute: 0.1 10*3/uL (ref 0.0–0.1)
Basophils Relative: 0 %
Eosinophils Absolute: 0 10*3/uL (ref 0.0–0.5)
Eosinophils Relative: 0 %
HCT: 37 % (ref 36.0–46.0)
Hemoglobin: 12.3 g/dL (ref 12.0–15.0)
Immature Granulocytes: 1 %
Lymphocytes Relative: 5 %
Lymphs Abs: 0.7 10*3/uL (ref 0.7–4.0)
MCH: 34.3 pg — ABNORMAL HIGH (ref 26.0–34.0)
MCHC: 33.2 g/dL (ref 30.0–36.0)
MCV: 103.1 fL — ABNORMAL HIGH (ref 80.0–100.0)
Monocytes Absolute: 1.8 10*3/uL — ABNORMAL HIGH (ref 0.1–1.0)
Monocytes Relative: 13 %
Neutro Abs: 11.6 10*3/uL — ABNORMAL HIGH (ref 1.7–7.7)
Neutrophils Relative %: 81 %
Platelet Count: 454 10*3/uL — ABNORMAL HIGH (ref 150–400)
RBC: 3.59 MIL/uL — ABNORMAL LOW (ref 3.87–5.11)
RDW: 12.7 % (ref 11.5–15.5)
WBC Count: 14.4 10*3/uL — ABNORMAL HIGH (ref 4.0–10.5)
nRBC: 0 % (ref 0.0–0.2)

## 2020-12-06 LAB — IRON AND TIBC
Iron: 35 ug/dL — ABNORMAL LOW (ref 41–142)
Saturation Ratios: 17 % — ABNORMAL LOW (ref 21–57)
TIBC: 204 ug/dL — ABNORMAL LOW (ref 236–444)
UIBC: 169 ug/dL (ref 120–384)

## 2020-12-06 LAB — FERRITIN: Ferritin: 360 ng/mL — ABNORMAL HIGH (ref 11–307)

## 2020-12-06 LAB — VITAMIN B12: Vitamin B-12: 1104 pg/mL — ABNORMAL HIGH (ref 180–914)

## 2020-12-06 LAB — TSH: TSH: 1.58 u[IU]/mL (ref 0.308–3.960)

## 2020-12-06 MED ORDER — AMOXICILLIN-POT CLAVULANATE 875-125 MG PO TABS: 1.0000 | ORAL_TABLET | Freq: Two times a day (BID) | ORAL | 0 refills | Status: AC

## 2020-12-06 NOTE — Telephone Encounter (Signed)
I spoke with Natalie Morrison and let her know per Dr Burr Medico her CXR showed very small pleural effusions.  She does not have pneumonia bronchitis.  She is to continue and finish the antibiotics prescribed. She verbalized understanding.  She stated her covid test was negative.

## 2020-12-09 ENCOUNTER — Telehealth: Payer: Self-pay | Admitting: Hematology

## 2020-12-09 NOTE — Telephone Encounter (Signed)
Called pt per 3/4n LOS - scheduled appt and left message for pt with appt date and time

## 2020-12-11 NOTE — Progress Notes (Incomplete)
[place]   Telephone:([phone] Fax:([phone]   Clinic Follow up Note   Patient Care Team: Eulas Post, MD as PCP - General Truitt Merle, MD as Consulting Physician (Hematology) Michael Boston, MD as Consulting Physician (General Surgery) Kyung Rudd, MD as Consulting Physician (Radiation Oncology) Mauri Pole, MD as Consulting Physician (Gastroenterology) Jackelyn Knife, MD as Rounding Team (Internal Medicine) Karie Mainland, RD as Dietitian (Nutrition)   I connected with Natalie Morrison on 12/11/2020 at  8:40 AM EST by {Blank single:19197::"video enabled telemedicine visit","telephone visit"} and verified that I am speaking with the correct person using two identifiers.  I discussed the limitations, risks, security and privacy concerns of performing an evaluation and management service by telephone and the availability of in person appointments. I also discussed with the patient that there may be a patient responsible charge related to this service. The patient expressed understanding and agreed to proceed.   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 Biopsied. - Food in the middle third of the esophagus. Removal was successful.   02/08/2020 Initial Biopsy   Diagnosis 02/08/20 Esophagus, biopsy, mass - INVASIVE  SQUAMOUS CELL CARCINOMA - SEE COMMENT Microscopic Comment Based on the biopsy, the carcinoma appears moderately differentiated. Dr. Silverio Decamp was paged on Feb 09, 2020. Dr. Jeannie Done reviewed the case and agrees with the above diagnosis.    02/11/2020 Cancer Staging   Staging form: Esophagus - Other Histologies, AJCC 8th Edition - Clinical stage from 02/11/2020: cTX, cN1, cM0 - Signed by Truitt Merle, MD on 02/14/2020   02/14/2020 Initial Diagnosis   Esophageal cancer ([place])   02/14/2020 Imaging   CT CAP w contrast  IMPRESSION: 1. Irregular wall thickening and an ill-defined mass involving the distal esophagus consistent with known esophageal cancer. 2. Mildly enlarged lymph nodes lateral to the gastroesophageal junction and in the gastrohepatic ligament, suspicious for metastatic disease. 3. No evidence of distant metastatic disease. 4. 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 5. Aortic Atherosclerosis (ICD10-I70.0).   03/18/2020 - 04/22/2020 Chemotherapy   Concurrent chemoRT with weekly carboplatin and Taxol starting 03/18/20-04/22/20   03/18/2020 - 04/25/2020 Radiation Therapy   Concurrent chemo RT with weekly CT by Dr Lisbeth Renshaw starting 03/18/20-04/25/20   07/04/2020 Imaging   CT Chest At [facility]  IMPRESSION:  1. Decreased mid and distal esophageal wall thickening with decreased size  of small lymph node adjacent to the upper esophagus and just distal to the  diaphragmatic hiatus.  2. Left lower lobe 2mm pulmonary nodule is newly visualized and  indeterminate. Attention on follow-up.  3. Please see same-day CT of the abdomen and pelvis for findings below the  diaphragm.    07/04/2020 Imaging   CT AP at [facility] Impression:  1. Decreased size of multiple previously seen enlarged perigastric lymph  nodes. No new lymphadenopathy or evidence of visceral metastatic disease  within the abdomen.   2. Please see same day chest CT for findings above the diaphragm.    07/22/2020 Surgery   ESOPHAGECTOMY, TOTAL/NEAR TOTAL, WITH THORACOSCOPIC MOBILIZATION OF MEDIASTINAL ESOPHAGUS, WITH CERVICAL ESOPHAGOGASTROSTOMY by Dr M'Amico at [facility]    07/22/2020 Pathology Results   DIAGNOSIS    A. Pleura, biopsy:   Fibromuscular tissue, negative for carcinoma.   B. Stomach and esophagus, esophagogastrectomy:   Invasive squamous cell carcinoma, moderately differentiated, 4.5 cm. See synoptic report.   C. Left gastric lymph node, excision:   Six lymph nodes, negative for metastatic carcinoma (0/6).   D. Lesser curvature lymph node, excision:   Five lymph nodes, negative for metastatic carcinoma (0/5).   E. Final esophageal margin, excision:   Portion of stomach and esophagus, negative for carcinoma or dysplasia.   F. Final gastric margin, excision:   Portion of stomach, negative for carcinoma or dysplasia.     07/22/2020 Cancer Staging   Staging form: Esophagus - Other Histologies, AJCC 8th Edition - Pathologic stage from 07/22/2020: pT3, pN0, cM0, G2 - Signed by Truitt Merle, MD on 08/18/2020   07/27/2020 Imaging   CT Chest at [facility]  IMPRESSION:  1.  Post surgical appearance of interval esophagectomy with gastric  pull-through. Locules of free air adjacent to the anastomosis favor  postsurgical given proximal in the surgical site however if there is  suspicion of leak consider a swallow study.  2.  Small to moderate loculated right hydropneumothorax with locules of are  posteriorly. Of not right chest tube within the fissure while the pleural  fluid is more posterior.  3.  Moderate left pleural effusion.     11/22/2020 -  Chemotherapy    Patient is on Treatment Plan: GASTROESOPHAGEAL NIVOLUMAB Q14D X 8 CYCLES / NIVOLUMAB Q28D         CURRENT THERAPY:  Nivolumab monthly starting 11/22/20 for 1 year (first few treatment q2weeks)  INTERVAL HISTORY: *** Natalie Morrison presents for a virtual follow up. {They identified themselves by birth  date/face to face video.}    REVIEW OF SYSTEMS:  *** Constitutional: Denies fevers, chills or abnormal weight loss Eyes: Denies blurriness of vision Ears, nose, mouth, throat, and face: Denies mucositis or sore throat Respiratory: Denies cough, dyspnea or wheezes Cardiovascular: Denies palpitation, chest discomfort or lower extremity swelling Gastrointestinal:  Denies nausea, heartburn or change in bowel habits Skin: Denies abnormal skin rashes Lymphatics: Denies new lymphadenopathy or easy bruising Neurological:Denies numbness, tingling or new weaknesses Behavioral/Psych: Mood is stable, no new changes  All other systems were reviewed with the patient and are negative.  MEDICAL HISTORY:  Past Medical History:  Diagnosis Date  . Alcohol abuse   . Anemia   . Anxiety   . Anxiety and depression   . Asthma   . Depression   . Hepatitis A    "when I was a kid"  . Hypertension     SURGICAL HISTORY: Past Surgical History:  Procedure Laterality Date  . BREAST LUMPECTOMY Right   . GASTROSTOMY N/A 02/22/2020   Procedure: OPEN PLACEMENT JEJUNOSTOMY FEEDING TUBE;  Surgeon: Jesusita Oka, MD;  Location: [place];  Service: General;  Laterality: N/A;  . IR [place] DUODEN/JEJUNO TUBE PERCUT W/FLUORO  04/25/2020  . TONSILLECTOMY AND ADENOIDECTOMY Bilateral over 30 years ago  . UPPER GASTROINTESTINAL ENDOSCOPY      I have reviewed the social history and family history with the patient and they are unchanged from previous note.  ALLERGIES:  is allergic  to nitrofurantoin, sulfa antibiotics, and sulfonamide derivatives.  MEDICATIONS:  Current Outpatient Medications  Medication Sig Dispense Refill  . acetaminophen (TYLENOL) 325 MG tablet Take 650 mg by mouth every 6 (six) hours as needed for mild pain or headache.    Marland Kitchen amoxicillin-clavulanate (AUGMENTIN) 875-125 MG tablet Take 1 tablet by mouth 2 (two) times daily. 14 tablet 0  . Cyanocobalamin 1000 MCG/15ML LIQD Take 15 mLs (1,000 mcg total)  by mouth daily. 450 mL 1  . loratadine (CLARITIN) 10 MG tablet TAKE 1 TABLET BY MOUTH EVERY DAY 30 tablet 2  . prochlorperazine (COMPAZINE) 10 MG tablet Take 1 tablet (10 mg total) by mouth every 6 (six) hours as needed for nausea or vomiting. 30 tablet 1   Current Facility-Administered Medications  Medication Dose Route Frequency Provider Last Rate Last Admin  . 0.9 %  sodium chloride infusion  500 mL Intravenous Once Nandigam, Venia Minks, MD       Facility-Administered Medications Ordered in Other Visits  Medication Dose Route Frequency Provider Last Rate Last Admin  . ondansetron (ZOFRAN) injection 8 mg  8 mg Intravenous Once Cira Rue K, NP      . promethazine (PHENERGAN) injection 25 mg  25 mg Intravenous Once Cira Rue K, NP      . sodium chloride flush (NS) 0.9 % injection 10 mL  10 mL Intracatheter Once PRN Alla Feeling, NP        PHYSICAL EXAMINATION: ECOG PERFORMANCE STATUS: {CHL ONC ECOG PS:[id number]}  No vitals taken today, Exam not performed today   LABORATORY DATA:  I have reviewed the data as listed CBC Latest Ref Rng & Units 12/06/2020 11/22/2020 11/15/2020  WBC 4.0 - 10.5 K/uL 14.4(H) 9.1 9.8  Hemoglobin 12.0 - 15.0 g/dL 12.3 12.0 13.9  Hematocrit 36.0 - 46.0 % 37.0 36.3 42.0  Platelets 150 - 400 K/uL 454(H) 303 270     CMP Latest Ref Rng & Units 12/06/2020 11/22/2020 11/15/2020  Glucose 70 - 99 mg/dL 105(H) 101(H) 149(H)  BUN 6 - 20 mg/dL 7 10 10   Creatinine 0.44 - 1.00 mg/dL 0.61 0.60 0.67  Sodium 135 - 145 mmol/L 139 137 133(L)  Potassium 3.5 - 5.1 mmol/L 3.3(L) 4.0 4.3  Chloride 98 - 111 mmol/L 100 106 103  CO2 22 - 32 mmol/L 25 21(L) 24  Calcium 8.9 - 10.3 mg/dL 8.9 8.9 9.2  Total Protein 6.5 - 8.1 g/dL 7.2 6.7 7.0  Total Bilirubin 0.3 - 1.2 mg/dL 0.5 0.4 0.4  Alkaline Phos 38 - 126 U/L 130(H) 155(H) 147(H)  AST 15 - 41 U/L 13(L) 15 27  ALT 0 - 44 U/L 7 10 21       RADIOGRAPHIC STUDIES: I have personally reviewed the radiological images as  listed and agreed with the findings in the report. No results found.   ASSESSMENT & PLAN:  Natalie Morrison is a 50 y.o. female with   1. Esophagealsquamous cell carcinoma,in midesophagus,ypT3N0M0 -She was diagnosed in 02/2020.EGD showeda partially obstructing tumor, biopsy confirmedinvasive squamous cells carcinoma. Imaging indicates mildly enlarged LNs lateral to the GEJ. -Her5/28/21 PET scan showedlocally advanced disease. -She completedconcurrent chemoRT with weeklycarboplatin and Taxol 03/18/20-04/25/20.  -She has decided toproceed with esophagectomy at the [facility] [facility], 2021. Surgical path showed 4.5cm of residual disease, which means complete response to neoadjuvant treatment was not achieved. This was complete removed with surgery. She still has high risk of recurrence. -Based on recently published data of [place] 577[place], I started her on 1 year adjuvant  Nivolumab monthly (first few treatments every 2 weeks) beginning on 11/22/20.  -After First cycle she has worsened nausea now with vomiting and worsening of her cough, SOB with fever. I discussed these are not common side effects of Nivo. I recommend holding treatment while we treat and manage this. She is agreeable.  -She will proceed with follow up scan at [facility] on 12/10/20.  I discussed Nivo can lead to Pneumonitis and can be seen on CT scan.  -Phone call on 3/11  2. Cough, SOB, N&V, Fever  -After surgery she started having post nasal drip and nausea (about 2 weeks ago). After start of her Nivolumab she notes this has worsened with Vomiting daily, SOB and cough with pain, chills last night and fever today (12/06/20). She also has muscle aches.  -Her breathing was adequate on exam today.  -She notes she had COVID series vaccine and COVID booster and Flu shot.  -I recommend chest Xray today (12/06/20). I advised her to use home COVID test. If negative she should get a PCR based test at local pharmacy in 3-4 days.  -I will  call in Augmentin (12/06/20).  -She does plan to have CT Scan with [facility] on 3/8. I discussed Nivo can lead to Pneumonitis and can be seen on CT scan.  -We discussed this is likely acute bronchitis, I will also want to make sure she does not have nivolumab related pneumonitis  3.Excessive alcohol usage and heavysmokinghistory,alcohol Cessation  -She has quit smoking 2 years ago and stopped using Sikes 1 year ago. She rarely drinks alcohol now and stopped since her pain, but was drinking for 50 years.   4. B12 Deficiency  -She has been on oral B12 but has not been able to swallow due #1. She has restarted oral B12 after esophageal surgery. If not enough, will start B12 injections.  5. Social and Acupuncturist  -She is married with no children. She has in-laws in town -[place] beenunemployed since late 2019, she was working in a bank. Her husband is a bar tender -She currently does not have insurance, but is looking to get coverage. -F/uwithfinancial advocate to help apply for grants and coverage. She was not eligible for medicaid.  -Will apply for drug replacement with Nivo   PLAN: -will hold on Nivolumab today  -I called in Augmentin today for 7 days  -Chest Xray today at [facility]. It came back negative except a bilateral small pleural effusions. -She is scheduled for CT scan and follow-up with her surgeon at [facility] next Tuesday -Phone call on 3/11 for follow-up   No problem-specific Assessment & Plan notes found for this encounter.   No orders of the defined types were placed in this encounter.  I discussed the assessment and treatment plan with the patient. The patient was provided an opportunity to ask questions and all were answered. The patient agreed with the plan and demonstrated an understanding of the instructions.  The patient was advised to call back or seek an in-person evaluation if the symptoms worsen or if the condition fails to improve as anticipated.  The  total time spent in the appointment was {CHL ONC TIME VISIT - XTKWI:[id number]}.    Joslyn Devon 12/11/2020   Oneal Deputy, am acting as scribe for Truitt Merle, MD.   {Add scribe attestation statement}

## 2020-12-13 ENCOUNTER — Inpatient Hospital Stay: Payer: Self-pay | Admitting: Hematology

## 2020-12-14 ENCOUNTER — Other Ambulatory Visit: Payer: Self-pay | Admitting: Hematology

## 2020-12-14 DIAGNOSIS — C155 Malignant neoplasm of lower third of esophagus: Secondary | ICD-10-CM

## 2020-12-16 NOTE — Telephone Encounter (Signed)
See request °

## 2020-12-17 ENCOUNTER — Telehealth: Payer: Self-pay | Admitting: Hematology

## 2020-12-17 NOTE — Telephone Encounter (Signed)
Scheduled per providers nurses orders, patient has been called and notified.

## 2020-12-18 NOTE — Progress Notes (Signed)
[place]   [phone] Fax:([phone]) [phone]   Clinic Follow up Note   Patient Care Team: Eulas Post, MD as PCP - General Truitt Merle, MD as Consulting Physician (Hematology) Michael Boston, MD as Consulting Physician (General Surgery) Kyung Rudd, MD as Consulting Physician (Radiation Oncology) Mauri Pole, MD as Consulting Physician (Gastroenterology) Jackelyn Knife, MD as Rounding Team (Internal Medicine) Karie Mainland, RD as Dietitian (Nutrition)   I connected with Natalie Morrison on 12/19/2020 at 10:00 AM EDT by telephone visit and verified that I am speaking with the correct person using two identifiers.  I discussed the limitations, risks, security and privacy concerns of performing an evaluation and management service by telephone and the availability of in person appointments. I also discussed with the patient that there may be a patient responsible charge related to this service. The patient expressed understanding and agreed to proceed.   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 Biopsied. - Food in the middle third of the esophagus. Removal was successful.   02/08/2020 Initial Biopsy   Diagnosis 02/08/20 Esophagus, biopsy, mass - INVASIVE SQUAMOUS CELL CARCINOMA - SEE COMMENT Microscopic  Comment Based on the biopsy, the carcinoma appears moderately differentiated. Dr. Silverio Decamp was paged on Feb 09, 2020. Dr. Jeannie Done reviewed the case and agrees with the above diagnosis.    02/11/2020 Cancer Staging   Staging form: Esophagus - Other Histologies, AJCC 8th Edition - Clinical stage from 02/11/2020: cTX, cN1, cM0 - Signed by Truitt Merle, MD on 02/14/2020   02/14/2020 Initial Diagnosis   Esophageal cancer ([facility])   02/14/2020 Imaging   CT CAP w contrast  IMPRESSION: 1. Irregular wall thickening and an ill-defined mass involving the distal esophagus consistent with known esophageal cancer. 2. Mildly enlarged lymph nodes lateral to the gastroesophageal junction and in the gastrohepatic ligament, suspicious for metastatic disease. 3. No evidence of distant metastatic disease. 4. 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 5. Aortic Atherosclerosis (ICD10-I70.0).   03/18/2020 - 04/22/2020 Chemotherapy   Concurrent chemoRT with weekly carboplatin and Taxol starting 03/18/20-04/22/20   03/18/2020 - 04/25/2020 Radiation Therapy   Concurrent chemo RT with weekly CT by Dr Lisbeth Renshaw starting 03/18/20-04/25/20   07/04/2020 Imaging   CT Chest At [facility]  IMPRESSION:  1. Decreased mid and distal esophageal wall thickening with decreased size  of small lymph node adjacent to the upper esophagus and just distal to the  diaphragmatic hiatus.  2. Left lower lobe 7mm pulmonary nodule is newly visualized and  indeterminate. Attention on follow-up.  3. Please see same-day CT of the abdomen and pelvis for findings below the  diaphragm.    07/04/2020 Imaging   CT AP at [facility] Impression:  1. Decreased size of multiple previously seen enlarged perigastric lymph  nodes. No new lymphadenopathy or evidence of visceral metastatic disease  within the abdomen.  2. Please see same day chest CT for findings above the diaphragm.   07/22/2020 Surgery   ESOPHAGECTOMY, TOTAL/NEAR TOTAL, WITH  THORACOSCOPIC MOBILIZATION OF MEDIASTINAL ESOPHAGUS, WITH CERVICAL ESOPHAGOGASTROSTOMY by Dr M'Amico at [facility]    07/22/2020 Pathology Results   DIAGNOSIS    A. Pleura, biopsy:   Fibromuscular tissue, negative for carcinoma.   B. Stomach and esophagus, esophagogastrectomy:   Invasive squamous cell carcinoma, moderately differentiated, 4.5 cm. See synoptic report.   C. Left gastric lymph node, excision:   Six lymph nodes, negative for metastatic carcinoma (0/6).   D. Lesser curvature lymph node, excision:   Five lymph nodes, negative for metastatic carcinoma (0/5).   E. Final esophageal margin, excision:   Portion of stomach and esophagus, negative for carcinoma or dysplasia.   F. Final gastric margin, excision:   Portion of stomach, negative for carcinoma or dysplasia.     07/22/2020 Cancer Staging   Staging form: Esophagus - Other Histologies, AJCC 8th Edition - Pathologic stage from 07/22/2020: pT3, pN0, cM0, G2 - Signed by Truitt Merle, MD on 08/18/2020   07/27/2020 Imaging   CT Chest at [facility]  IMPRESSION:  1.  Post surgical appearance of interval esophagectomy with gastric  pull-through. Locules of free air adjacent to the anastomosis favor  postsurgical given proximal in the surgical site however if there is  suspicion of leak consider a swallow study.  2.  Small to moderate loculated right hydropneumothorax with locules of are  posteriorly. Of not right chest tube within the fissure while the pleural  fluid is more posterior.  3.  Moderate left pleural effusion.     12/10/2020 Imaging   CT AP at [facility] 12/10/20 Impression:   1. New hypoattenuation of the anterior left renal mid kidney. Findings  concerning for underlying metastatic disease or pyelonephritis. Recommend  correlation with urinalysis to exclude pyelonephritis.      CT Chest at [facility] 12/10/20  IMPRESSION:  1.  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 There are small  locules of medially-located pleural fluid with rim enhancement, in  additional to a rim enhancing loculated intrafissural pleural collection in  the right major fissure. These findings are raise suspicion for empyema.  2.  New subcarinal mediastinal rim enhancing lesion. This is most likely a  mediastinal fluid collection/abscess. An enlarged necrotic lymph node  cannot be entirely exclude.  3.  New left lower lobe nodule, which raises concern for metastasis. A  secondary consideration would include an atypical infection such as a  fungal infection.  4.  Please see separately dictated CT abdomen pelvis report for findings  below the diaphragm.    12/18/2020 -  Chemotherapy   Nivolumab monthly starting 11/22/20 for 1 year (first few treatment q2weeks)       CURRENT THERAPY:  Nivolumab monthly starting 11/22/20 for 1 year (first few treatment q2weeks), held after first dose due to multiple symptoms   INTERVAL HISTORY:  Natalie Morrison presents for a virtual follow up.  She identified herself by date of birth and address.  She was seen by her surgeon Dr. 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  She underwent EGD and stent placement last week.  She is able to eat after procedure, but still not feeling well, fatigue, with cough, and intermittent nausea.  No fever  or chills.  No dysuria or urinary frequency.  All other systems were reviewed with the patient and are negative.  MEDICAL HISTORY:  Past Medical History:  Diagnosis Date  . Alcohol abuse   . Anemia   . Anxiety   . Anxiety and depression   . Asthma   . Depression   . Hepatitis A    "when I was a kid"  . Hypertension     SURGICAL HISTORY: Past Surgical History:  Procedure Laterality Date  . BREAST LUMPECTOMY Right   . GASTROSTOMY N/A 02/22/2020    Procedure: OPEN PLACEMENT JEJUNOSTOMY FEEDING TUBE;  Surgeon: Jesusita Oka, MD;  Location: [place];  Service: General;  Laterality: N/A;  . IR [place] DUODEN/JEJUNO TUBE PERCUT W/FLUORO  04/25/2020  . TONSILLECTOMY AND ADENOIDECTOMY Bilateral over 30 years ago  . UPPER GASTROINTESTINAL ENDOSCOPY      I have reviewed the social history and family history with the patient and they are unchanged from previous note.  ALLERGIES:  is allergic to nitrofurantoin, sulfa antibiotics, and sulfonamide derivatives.  MEDICATIONS:  Current Outpatient Medications  Medication Sig Dispense Refill  . acetaminophen (TYLENOL) 325 MG tablet Take 650 mg by mouth every 6 (six) hours as needed for mild pain or headache.    Marland Kitchen amoxicillin-clavulanate (AUGMENTIN) 875-125 MG tablet Take 1 tablet by mouth 2 (two) times daily. 14 tablet 0  . Cyanocobalamin 1000 MCG/15ML LIQD Take 15 mLs (1,000 mcg total) by mouth daily. 450 mL 1  . loratadine (CLARITIN) 10 MG tablet TAKE 1 TABLET BY MOUTH EVERY DAY 30 tablet 2  . ondansetron (ZOFRAN) 8 MG tablet TAKE 1 TABLET 2 TIMES DAILY AS NEEDED FOR REFRACTORY NAUSEA/VOMITING. START ON DAY 3 AFTER CHEMO 30 tablet 1  . prochlorperazine (COMPAZINE) 10 MG tablet Take 1 tablet (10 mg total) by mouth every 6 (six) hours as needed for nausea or vomiting. 30 tablet 1   Current Facility-Administered Medications  Medication Dose Route Frequency Provider Last Rate Last Admin  . 0.9 %  sodium chloride infusion  500 mL Intravenous Once Nandigam, Venia Minks, MD       Facility-Administered Medications Ordered in Other Visits  Medication Dose Route Frequency Provider Last Rate Last Admin  . ondansetron (ZOFRAN) injection 8 mg  8 mg Intravenous Once Cira Rue K, NP      . promethazine (PHENERGAN) injection 25 mg  25 mg Intravenous Once Cira Rue K, NP      . sodium chloride flush (NS) 0.9 % injection 10 mL  10 mL Intracatheter Once PRN Alla Feeling, NP        PHYSICAL  EXAMINATION: ECOG PERFORMANCE STATUS: 2 - Symptomatic, <50% confined to bed  No vitals taken today, Exam not performed today   LABORATORY DATA:  I have reviewed the data as listed CBC Latest Ref Rng & Units 12/06/2020 11/22/2020 11/15/2020  WBC 4.0 - 10.5 K/uL 14.4(H) 9.1 9.8  Hemoglobin 12.0 - 15.0 g/dL 12.3 12.0 13.9  Hematocrit 36.0 - 46.0 % 37.0 36.3 42.0  Platelets 150 - 400 K/uL 454(H) 303 270     CMP Latest Ref Rng & Units 12/06/2020 11/22/2020 11/15/2020  Glucose 70 - 99 mg/dL 105(H) 101(H) 149(H)  BUN 6 - 20 mg/dL 7 10 10   Creatinine 0.44 - 1.00 mg/dL 0.61 0.60 0.67  Sodium 135 - 145 mmol/L 139 137 133(L)  Potassium 3.5 - 5.1 mmol/L 3.3(L) 4.0 4.3  Chloride 98 - 111 mmol/L 100 106 103  CO2 22 - 32  mmol/L 25 21(L) 24  Calcium 8.9 - 10.3 mg/dL 8.9 8.9 9.2  Total Protein 6.5 - 8.1 g/dL 7.2 6.7 7.0  Total Bilirubin 0.3 - 1.2 mg/dL 0.5 0.4 0.4  Alkaline Phos 38 - 126 U/L 130(H) 155(H) 147(H)  AST 15 - 41 U/L 13(L) 15 27  ALT 0 - 44 U/L 7 10 21       RADIOGRAPHIC STUDIES: I have personally reviewed the radiological images as listed and agreed with the findings in the report. No results found.   ASSESSMENT & PLAN:  Natalie Morrison is a 50 y.o. female with    1. Esophagealsquamous cell carcinoma,in midesophagus,ypT3N0M0 -She was diagnosed in 02/2020.EGD showeda partially obstructing tumor, biopsy confirmedinvasive squamous cells carcinoma. Imaging indicates mildly enlarged LNs lateral to the GEJ. -Her5/28/21 PET scan showedlocally advanced disease. -She completedconcurrent chemoRT with weeklycarboplatin and Taxol 03/18/20-04/25/20.  -She has decided toproceed with esophagectomy at the [place] 18, 2021. Surgical path showed 4.5cm of residual disease, which means complete response to neoadjuvant treatment was not achieved. This was complete removed with surgery. She still has high risk of recurrence. -Based on recently published data of [place], I  started her on 1 year adjuvant Nivolumab monthly (first few treatments every 2 weeks) beginning on 11/22/20.  -After First cycle she has worsened nausea now with vomiting and worsening of her cough, SOB with fever. I discussed these are not common side effects of Nivo. I recommend holding treatment while we treat and manage this. She is agreeable.  -We discussed her 12/10/20 CT CAP from [facility] which showed esophageal right pleural fistula, and possible small abscess in the mediastinum and right pleural.  CT scan also showed new left lower lobe nodule, which is concerning for metastasis., and new hypoattenuation of the anterior left kidney, concerning for metastasis or pyelonephritis.  Her urine was negative. -She had esophageal stent placement for the fistula, she is still recovering, overall not feeling well. -We will continue to hold nivolumab for now. She is not interested in restarting soon.  -We will see her back in 3 weeks, repeat CT scan in 2 to 3 months.  2. Cough, SOB, N&V, Fever  -After surgery she started having post nasal drip and nausea (about 2 weeks ago). After start of her Nivolumab she notes this has worsened with Vomiting daily, SOB and cough with pain, chills last night and fever today (12/06/20). She also has muscle aches.  -Her breathing was adequate on exam today.  -She notes she had COVID series vaccine and COVID booster and Flu shot.  -12/06/20 chest Xray showed b/l pleural effusions, otherwise negative.  -I treated her with Augmentin (12/06/20).  -this is likely related to the esophageal fistula and related infection  3.Excessive alcohol usage and heavysmokinghistory,alcohol Cessation  -She has quit smoking 2 years ago and stopped using [place] 1 year ago. She rarely drinks alcohol now and stopped since her pain, but was drinking for 50 years.   4. B12 Deficiency  -She has been on oral B12 but has not been able to swallow due #1. She has restarted oral B12 after esophageal  surgery. If not enough, will start B12 injections.  5. Social and Acupuncturist  -She is married with no children. She has in-laws in town -Heard beenunemployed since late 2019, she was working in a bank. Her husband is a bar tender -She currently does not have insurance, but is looking to get coverage. -F/uwithfinancial advocate to help apply for grants and coverage. She was  not eligible for medicaid.  -Will apply for drug replacement with Nivo   PLAN: -scan and EGD findings reviewed  -will hold on Nivolumab for now  -I called in Augmentin today for 7 days  -lab and f/u in 3 weeks   No problem-specific Assessment & Plan notes found for this encounter.   No orders of the defined types were placed in this encounter.  I discussed the assessment and treatment plan with the patient. The patient was provided an opportunity to ask questions and all were answered. The patient agreed with the plan and demonstrated an understanding of the instructions.  The patient was advised to call back or seek an in-person evaluation if the symptoms worsen or if the condition fails to improve as anticipated.  The total time spent in the appointment was 15 minutes.    Truitt Merle, MD 12/19/2020   I, Joslyn Devon, am acting as scribe for Truitt Merle, MD.   I have reviewed the above documentation for accuracy and completeness, and I agree with the above.

## 2020-12-19 ENCOUNTER — Inpatient Hospital Stay (HOSPITAL_BASED_OUTPATIENT_CLINIC_OR_DEPARTMENT_OTHER): Payer: Self-pay | Admitting: Hematology

## 2020-12-19 ENCOUNTER — Encounter: Payer: Self-pay | Admitting: Hematology

## 2020-12-19 DIAGNOSIS — C155 Malignant neoplasm of lower third of esophagus: Secondary | ICD-10-CM

## 2020-12-20 ENCOUNTER — Telehealth: Payer: Self-pay | Admitting: Hematology

## 2020-12-20 NOTE — Telephone Encounter (Signed)
Scheduled follow-up appointment per 3/17 los. Patient is aware. ?

## 2020-12-24 ENCOUNTER — Emergency Department (HOSPITAL_COMMUNITY)
Admission: EM | Admit: 2020-12-24 | Discharge: 2020-12-25 | Disposition: A | Discharge status: Other Acute Inpatient Hospital | Payer: Self-pay | Attending: Emergency Medicine | Admitting: Emergency Medicine

## 2020-12-24 ENCOUNTER — Emergency Department (HOSPITAL_COMMUNITY): Payer: Self-pay

## 2020-12-24 DIAGNOSIS — Z79899 Other long term (current) drug therapy: Secondary | ICD-10-CM | POA: Insufficient documentation

## 2020-12-24 DIAGNOSIS — Z87891 Personal history of nicotine dependence: Secondary | ICD-10-CM | POA: Insufficient documentation

## 2020-12-24 DIAGNOSIS — J9601 Acute respiratory failure with hypoxia: Secondary | ICD-10-CM | POA: Insufficient documentation

## 2020-12-24 DIAGNOSIS — R000 Tachycardia, unspecified: Secondary | ICD-10-CM | POA: Insufficient documentation

## 2020-12-24 DIAGNOSIS — Z20822 Contact with and (suspected) exposure to covid-19: Secondary | ICD-10-CM | POA: Insufficient documentation

## 2020-12-24 DIAGNOSIS — Z7952 Long term (current) use of systemic steroids: Secondary | ICD-10-CM | POA: Insufficient documentation

## 2020-12-24 DIAGNOSIS — Z8501 Personal history of malignant neoplasm of esophagus: Secondary | ICD-10-CM | POA: Insufficient documentation

## 2020-12-24 DIAGNOSIS — J45909 Unspecified asthma, uncomplicated: Secondary | ICD-10-CM | POA: Insufficient documentation

## 2020-12-24 DIAGNOSIS — J181 Lobar pneumonia, unspecified organism: Secondary | ICD-10-CM | POA: Insufficient documentation

## 2020-12-24 DIAGNOSIS — K223 Perforation of esophagus: Secondary | ICD-10-CM

## 2020-12-24 DIAGNOSIS — J189 Pneumonia, unspecified organism: Secondary | ICD-10-CM

## 2020-12-24 DIAGNOSIS — I10 Essential (primary) hypertension: Secondary | ICD-10-CM | POA: Insufficient documentation

## 2020-12-24 LAB — CBC WITH DIFFERENTIAL/PLATELET
Abs Immature Granulocytes: 0.4 10*3/uL — ABNORMAL HIGH (ref 0.00–0.07)
Basophils Absolute: 0.1 10*3/uL (ref 0.0–0.1)
Basophils Relative: 0 %
Eosinophils Absolute: 0 10*3/uL (ref 0.0–0.5)
Eosinophils Relative: 0 %
HCT: 40.8 % (ref 36.0–46.0)
Hemoglobin: 12.8 g/dL (ref 12.0–15.0)
Immature Granulocytes: 1 %
Lymphocytes Relative: 4 %
Lymphs Abs: 1.5 10*3/uL (ref 0.7–4.0)
MCH: 34.2 pg — ABNORMAL HIGH (ref 26.0–34.0)
MCHC: 31.4 g/dL (ref 30.0–36.0)
MCV: 109.1 fL — ABNORMAL HIGH (ref 80.0–100.0)
Monocytes Absolute: 2.9 10*3/uL — ABNORMAL HIGH (ref 0.1–1.0)
Monocytes Relative: 9 %
Neutro Abs: 28.7 10*3/uL — ABNORMAL HIGH (ref 1.7–7.7)
Neutrophils Relative %: 86 %
Platelets: 634 10*3/uL — ABNORMAL HIGH (ref 150–400)
RBC: 3.74 MIL/uL — ABNORMAL LOW (ref 3.87–5.11)
RDW: 13.1 % (ref 11.5–15.5)
WBC: 33.7 10*3/uL — ABNORMAL HIGH (ref 4.0–10.5)
nRBC: 0 % (ref 0.0–0.2)

## 2020-12-24 LAB — COMPREHENSIVE METABOLIC PANEL
ALT: 13 U/L (ref 0–44)
AST: 24 U/L (ref 15–41)
Albumin: 2.4 g/dL — ABNORMAL LOW (ref 3.5–5.0)
Alkaline Phosphatase: 165 U/L — ABNORMAL HIGH (ref 38–126)
Anion gap: 18 — ABNORMAL HIGH (ref 5–15)
BUN: 13 mg/dL (ref 6–20)
CO2: 19 mmol/L — ABNORMAL LOW (ref 22–32)
Calcium: 9 mg/dL (ref 8.9–10.3)
Chloride: 98 mmol/L (ref 98–111)
Creatinine, Ser: 0.72 mg/dL (ref 0.44–1.00)
GFR, Estimated: >60 mL/min (ref >60)
Glucose, Bld: 113 mg/dL — ABNORMAL HIGH (ref 70–99)
Potassium: 3.7 mmol/L (ref 3.5–5.1)
Sodium: 135 mmol/L (ref 135–145)
Total Bilirubin: 1 mg/dL (ref 0.3–1.2)
Total Protein: 7.3 g/dL (ref 6.5–8.1)

## 2020-12-24 NOTE — ED Triage Notes (Addendum)
Pt reports intermitted coughing, Pt family reports pt had esophageal removal in dec and recently had another surgery to close an hole in her esophageal about 2 weeks ago. Pt family states cough has gotten progressively worse Pt has an hx of Asthma and use her inhalers with no relief

## 2020-12-25 ENCOUNTER — Other Ambulatory Visit: Payer: Self-pay

## 2020-12-25 ENCOUNTER — Emergency Department (HOSPITAL_COMMUNITY): Payer: Self-pay

## 2020-12-25 DIAGNOSIS — K223 Perforation of esophagus: Secondary | ICD-10-CM | POA: Diagnosis present

## 2020-12-25 LAB — RESP PANEL BY RT-PCR (FLU A&B, COVID) ARPGX2
Influenza A by PCR: NEGATIVE
Influenza B by PCR: NEGATIVE
SARS Coronavirus 2 by RT PCR: NEGATIVE

## 2020-12-25 LAB — PROTIME-INR
INR: 1.3 — ABNORMAL HIGH (ref 0.8–1.2)
Prothrombin Time: 15.9 seconds — ABNORMAL HIGH (ref 11.4–15.2)

## 2020-12-25 LAB — I-STAT BETA HCG BLOOD, ED (MC, WL, AP ONLY): I-stat hCG, quantitative: <5 m[IU]/mL (ref <5)

## 2020-12-25 LAB — APTT: aPTT: 39 seconds — ABNORMAL HIGH (ref 24–36)

## 2020-12-25 LAB — LACTIC ACID, PLASMA
Lactic Acid, Venous: 1.9 mmol/L (ref 0.5–1.9)
Lactic Acid, Venous: 1.4 mmol/L (ref 0.5–1.9)

## 2020-12-25 MED ORDER — MORPHINE SULFATE (PF) 2 MG/ML IV SOLN
2.0000 mg | Freq: Once | INTRAVENOUS | Status: AC
Start: 2020-12-25 — End: 2020-12-25
  Administered 2020-12-25: 2 mg via INTRAVENOUS
  Filled 2020-12-25: qty 1

## 2020-12-25 MED ORDER — VANCOMYCIN HCL 750 MG/150ML IV SOLN: 750.0000 mg | Freq: Two times a day (BID) | INTRAVENOUS | Status: DC

## 2020-12-25 MED ORDER — IOHEXOL 300 MG/ML  SOLN
75.0000 mL | Freq: Once | INTRAMUSCULAR | Status: AC | PRN
  Administered 2020-12-25: 75 mL via INTRAVENOUS

## 2020-12-25 MED ORDER — LACTATED RINGERS IV BOLUS (SEPSIS)
500.0000 mL | Freq: Once | INTRAVENOUS | Status: AC
  Administered 2020-12-25: 500 mL via INTRAVENOUS

## 2020-12-25 MED ORDER — LACTATED RINGERS IV BOLUS
1000.0000 mL | Freq: Once | INTRAVENOUS | Status: AC
  Administered 2020-12-25: 1000 mL via INTRAVENOUS

## 2020-12-25 MED ORDER — PIPERACILLIN-TAZOBACTAM 3.375 G IVPB: 3.3750 g | Freq: Three times a day (TID) | INTRAVENOUS | Status: DC

## 2020-12-25 MED ORDER — LACTATED RINGERS IV BOLUS (SEPSIS)
250.0000 mL | Freq: Once | INTRAVENOUS | Status: AC
  Administered 2020-12-25: 250 mL via INTRAVENOUS

## 2020-12-25 MED ORDER — SODIUM CHLORIDE 0.9 % IV SOLN
2.0000 g | Freq: Once | INTRAVENOUS | Status: AC
  Administered 2020-12-25: 2 g via INTRAVENOUS
  Filled 2020-12-25: qty 2

## 2020-12-25 MED ORDER — VANCOMYCIN HCL 1000 MG/200ML IV SOLN
1000.0000 mg | Freq: Once | INTRAVENOUS | Status: AC
  Administered 2020-12-25: 1000 mg via INTRAVENOUS
  Filled 2020-12-25: qty 200

## 2020-12-25 MED ORDER — LACTATED RINGERS IV SOLN: INTRAVENOUS | Status: DC

## 2020-12-25 MED ORDER — SODIUM CHLORIDE 0.9 % IV SOLN
500.0000 mg | Freq: Once | INTRAVENOUS | Status: AC
  Administered 2020-12-25: 500 mg via INTRAVENOUS
  Filled 2020-12-25: qty 500

## 2020-12-25 MED ORDER — LACTATED RINGERS IV BOLUS (SEPSIS)
1000.0000 mL | Freq: Once | INTRAVENOUS | Status: AC
  Administered 2020-12-25: 1000 mL via INTRAVENOUS

## 2020-12-25 NOTE — ED Provider Notes (Signed)
BP 102/66   Pulse (!) 110   Temp 98.2 F (36.8 C) (Oral)   Resp (!) 26   Ht 1.651 m (5\' 5" )   Wt 56.2 kg   SpO2 96%   BMI 20.63 kg/m  Patient currently protecting her airway.  She was updated on plan. CT imaging results were sent via power share to [facility]  The patient appears reasonably stabilized for transfer considering the current resources, flow, and capabilities available in the ED at this time, and I doubt any other [facility] requiring further screening and/or treatment in the ED prior to transfer.    Ripley Fraise, MD 12/25/20 [phone]

## 2020-12-25 NOTE — ED Provider Notes (Signed)
[facility] EMERGENCY DEPARTMENT Provider Note   CSN: [id number] Arrival date & time: 12/24/20  1936     History Chief Complaint  Patient presents with  . Cough  . Shortness of Breath    Natalie Morrison is a 50 y.o. female.  The history is provided by the patient.  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  She reports this is worsened over the past day.  She reports feeling feverish and fatigue.  She reports chest pain with coughing.  She does not typically require oxygen. She recently was on antibiotics, but none at this time. She reports she is not currently on chemotherapy or immunotherapy   She recently underwent esophageal stent placement for fistula at [facility] Past Medical History:  Diagnosis Date  . Alcohol abuse   . Anemia   . Anxiety   . Anxiety and depression   . Asthma   . Depression   . Hepatitis A    "when I was a kid"  . Hypertension     Patient Active Problem List   Diagnosis Date Noted  . Failure to thrive (0-17) 02/20/2020  . 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 Sepsis ([place]) 02/22/2018  . Pressure injury of skin 02/22/2018  . Airway intubation performed without difficulty   . Sepsis due to pneumonia ([place]) 01/27/2018  . Acute respiratory failure with hypoxia ([place]) 01/27/2018  . Hypokalemia 01/27/2018  . Prolonged QT interval 01/27/2018  . Acute respiratory failure with hypoxia and hypercapnia ([place]) 01/27/2018  . Hypomagnesemia 01/27/2018  .  Community acquired pneumonia   . Hyponatremia 06/23/2017  . Pancreatitis 10/30/2016  . Acute pancreatitis 10/30/2016  . Tobacco abuse 10/30/2016  . Moderate malnutrition ([place]) 12/03/2015  . Hypotension 09/23/2015  . Acute kidney injury ([place]) 09/23/2015  . Septic shock ([place]) 09/21/2015  . History of depression 06/14/2015  . Asthma exacerbation 04/07/2011  . HEMATURIA UNSPECIFIED 07/25/2009  . Alcohol abuse 06/26/2009  . ALLERGIC RHINITIS 06/26/2009  . Fatigue 06/26/2009  . Essential hypertension 06/28/2007    Past Surgical History:  Procedure Laterality Date  . BREAST LUMPECTOMY Right   . GASTROSTOMY N/A 02/22/2020   Procedure: OPEN PLACEMENT JEJUNOSTOMY FEEDING TUBE;  Surgeon: Jesusita Oka, MD;  Location: [facility];  Service: General;  Laterality: N/A;  . IR [place] DUODEN/JEJUNO TUBE PERCUT W/FLUORO  04/25/2020  . TONSILLECTOMY AND ADENOIDECTOMY Bilateral over 30 years ago  . UPPER GASTROINTESTINAL ENDOSCOPY       OB History   No obstetric history on file.     Family History  Problem Relation Age of Onset  . Heart disease Mother   . Liver disease Mother   . Heart disease Father   . Cancer Maternal Grandmother        unknown type cancer   . Colon cancer Neg Hx   . Esophageal cancer Neg Hx   . Rectal cancer Neg Hx   . Stomach cancer Neg Hx     Social History   Tobacco Use  . Smoking status: Former Smoker  Packs/day: 1.00    Years: 33.00    Pack years: 33.00    Types: Cigarettes    Quit date: 02/01/2018    Years since quitting: 2.8  . Smokeless tobacco: Never Used  Vaping Use  . Vaping Use: Never used  Substance Use Topics  . Alcohol use: Yes    Alcohol/week: 2.0 - 3.0 standard drinks    Types: 2 - 3 Shots of liquor per week    Comment: 2-3 drinks per day for 30 years, plan to stop   . Drug use: Yes    Frequency: 1.0 times per week    Types: Marijuana    Comment: 10/30/2016 "weekly", quit in 2020     Home Medications Prior to Admission medications    Medication Sig Start Date End Date Taking? Authorizing Provider  acetaminophen (TYLENOL) 160 MG/5ML suspension Place 30.5 mLs into feeding tube every 6 (six) hours as needed for moderate pain or headache. 08/18/20  Yes [provider]  Cyanocobalamin 1000 MCG/15ML LIQD Take 15 mLs (1,000 mcg total) by mouth daily. 03/18/20  Yes Alla Feeling, NP  fluticasone (FLONASE) 50 MCG/ACT nasal spray Place 1 spray into both nostrils at bedtime.   Yes [provider]  gabapentin (NEURONTIN) 100 MG capsule Take 100 mg by mouth 3 (three) times daily. 12/10/20  Yes [provider]  ibuprofen (ADVIL) 200 MG tablet Take 400 mg by mouth every 6 (six) hours as needed for headache or moderate pain.   Yes [provider]  loratadine (CLARITIN) 10 MG tablet TAKE 1 TABLET BY MOUTH EVERY DAY Patient taking differently: Take 10 mg by mouth at bedtime. 06/26/20  Yes Truitt Merle, MD  ondansetron (ZOFRAN) 8 MG tablet TAKE 1 TABLET 2 TIMES DAILY AS NEEDED FOR REFRACTORY NAUSEA/VOMITING. START ON DAY 3 AFTER CHEMO Patient taking differently: Take 8 mg by mouth 2 (two) times daily as needed for vomiting or nausea. 12/17/20  Yes Truitt Merle, MD  polyethylene glycol powder (GLYCOLAX/MIRALAX) 17 GM/SCOOP powder Take 17 g by mouth daily as needed for mild constipation. 08/18/20  Yes [provider]  prochlorperazine (COMPAZINE) 10 MG tablet Take 1 tablet (10 mg total) by mouth every 6 (six) hours as needed for nausea or vomiting. 11/27/20  Yes Truitt Merle, MD  amoxicillin-clavulanate (AUGMENTIN) 875-125 MG tablet Take 1 tablet by mouth 2 (two) times daily. Patient not taking: No sig reported 12/06/20   Truitt Merle, MD  ciprofloxacin (CIPRO) 500 MG tablet Take 500 mg by mouth See admin instructions. Bid x 7 days Patient not taking: Reported on 12/25/2020 12/10/20   [provider]    Allergies    Fentanyl, Nitrofurantoin, Sulfa antibiotics, and Sulfonamide derivatives  Review of Systems    Review of Systems  Constitutional: Positive for chills, fatigue and fever.  Respiratory: Positive for cough and shortness of breath.   Cardiovascular: Positive for chest pain.       CP with cough   Gastrointestinal: Negative for abdominal pain.  All other systems reviewed and are negative.   Physical Exam Updated Vital Signs BP 106/71   Pulse (!) 111   Temp 98.2 F (36.8 C) (Oral)   Resp (!) 24   SpO2 97%   Physical Exam CONSTITUTIONAL: Ill-appearing HEAD: Normocephalic/atraumatic EYES: EOMI/PERRL ENMT: Mucous membranes moist NECK: supple no meningeal signs SPINE/BACK:entire spine nontender CV: S1/S2 noted, tachycardic LUNGS: Mild tachypnea, crackles bilateral ABDOMEN: soft, nontender, no rebound or guarding, bowel sounds noted throughout abdomen GU:no cva tenderness NEURO: Pt is  awake/alert/appropriate, moves all extremitiesx4.  No facial droop.   EXTREMITIES: pulses normal/equal, full ROM SKIN: warm, color normal PSYCH: no abnormalities of mood noted, alert and oriented to situation  ED Results / Procedures / Treatments   Labs (all labs ordered are listed, but only abnormal results are displayed) Labs Reviewed  COMPREHENSIVE METABOLIC PANEL - Abnormal; Notable for the following components:      Result Value   CO2 19 (*)    Glucose, Bld 113 (*)    Albumin 2.4 (*)    Alkaline Phosphatase 165 (*)    Anion gap 18 (*)    All other components within normal limits  CBC WITH DIFFERENTIAL/PLATELET - Abnormal; Notable for the following components:   WBC 33.7 (*)    RBC 3.74 (*)    MCV 109.1 (*)    MCH 34.2 (*)    Platelets 634 (*)    Neutro Abs 28.7 (*)    Monocytes Absolute 2.9 (*)    Abs Immature Granulocytes 0.40 (*)    All other components within normal limits  PROTIME-INR - Abnormal; Notable for the following components:   Prothrombin Time 15.9 (*)    INR 1.3 (*)    All other components within normal limits  APTT - Abnormal; Notable for the following  components:   aPTT 39 (*)    All other components within normal limits  RESP PANEL BY RT-PCR (FLU A&B, COVID) ARPGX2  CULTURE, BLOOD (ROUTINE X 2)  CULTURE, BLOOD (ROUTINE X 2)  LACTIC ACID, PLASMA  LACTIC ACID, PLASMA  I-STAT BETA HCG BLOOD, ED ([facility], [facility], [facility] ONLY)    EKG EKG Interpretation  Date/Time:  Tuesday December 24 2020 19:46:40 EDT Ventricular Rate:  119 PR Interval:  174 QRS Duration: 60 QT Interval:  412 QTC Calculation: 579 R Axis:   15 Text Interpretation: Sinus tachycardia Biatrial enlargement Nonspecific ST and T wave abnormality Prolonged QT Abnormal ECG Interpretation limited secondary to artifact Confirmed by Ripley Fraise [id number]) on 12/25/2020 1:15:43 AM   Radiology DG Chest 2 View  Result Date: 12/24/2020 CLINICAL DATA:  Shortness of breath. EXAM: CHEST - 2 VIEW COMPARISON:  December 06, 2020. FINDINGS: The heart size and mediastinal contours are within normal limits. Interval placement of proximal esophageal stent is noted. New right perihilar and left basilar opacities are noted concerning for possible pneumonia. No pneumothorax is noted. Small pleural effusions may be present. The visualized skeletal structures are unremarkable. IMPRESSION: New right perihilar and left basilar opacities are noted concerning for possible pneumonia. Electronically Signed   By: Marijo Conception M.D.   On: 12/24/2020 20:46    Procedures .Critical Care Performed by: Ripley Fraise, MD Authorized by: Ripley Fraise, MD   Critical care provider statement:    Critical care time (minutes):  63   Critical care start time:  12/25/2020 2:33 AM   Critical care end time:  12/25/2020 3:36 AM   Critical care time was exclusive of:  Separately billable procedures and treating other patients   Critical care was necessary to treat or prevent imminent or life-threatening deterioration of the following conditions:  Respiratory failure and sepsis   Critical care was time spent personally by me on  the following activities:  Ordering and review of radiographic studies, ordering and review of laboratory studies, pulse oximetry, re-evaluation of patient's condition, review of old charts, examination of patient, discussions with consultants, development of treatment plan with patient or surrogate, evaluation of patient's response to treatment, ordering and performing treatments  and interventions and obtaining history from patient or surrogate   I assumed direction of critical care for this patient from another provider in my specialty: no     Care discussed with: admitting provider       Medications Ordered in ED Medications  lactated ringers infusion ( Intravenous New Bag/Given 12/25/20 0405)  vancomycin (VANCOREADY) IVPB 1000 mg/200 mL (1,000 mg Intravenous New Bag/Given 12/25/20 0406)  lactated ringers bolus 1,000 mL (0 mLs Intravenous Stopped 12/25/20 0400)    And  lactated ringers bolus 500 mL (0 mLs Intravenous Stopped 12/25/20 0258)    And  lactated ringers bolus 250 mL (0 mLs Intravenous Stopped 12/25/20 0258)  ceFEPIme (MAXIPIME) 2 g in sodium chloride 0.9 % 100 mL IVPB (0 g Intravenous Stopped 12/25/20 0400)  azithromycin (ZITHROMAX) 500 mg in sodium chloride 0.9 % 250 mL IVPB (0 mg Intravenous Stopped 12/25/20 0400)  morphine 2 MG/ML injection 2 mg (2 mg Intravenous Given 12/25/20 0406)    ED Course  I have reviewed the triage vital signs and the nursing notes.  Pertinent labs & imaging results that were available during my care of the patient were reviewed by me and considered in my medical decision making (see chart for details).    MDM Rules/Calculators/A&P                          2:33 AM Patient with previous history of esophageal cancer, recent procedure/esophageal stent placement due to fistula presents with cough feeling feverish and shortness of breath.  X-ray is consistent with pneumonia.  Code sepsis has been called.  Patient has a new oxygen requirement. Patient will  require admission 3:37 AM Patient has been checked on several times,Overall patient appears to be improving.  She still has an oxygen requirement.  Vitals are improving. Plan for admission for IV fluids and antibiotics.  Patient may need CT chest to evaluate pneumonia 4:28 AM Discussed with Dr. Alcario Drought for admission. Plan will be to obtain CT chest and then hospitalist admission  Final Clinical Impression(s) / ED Diagnoses Final diagnoses:  Community acquired pneumonia of right upper lobe of lung  Acute respiratory failure with hypoxia [facility])    Rx / DC Orders ED Discharge Orders    None       Ripley Fraise, MD 12/25/20 [phone]

## 2020-12-25 NOTE — Sepsis Progress Note (Signed)
Following for sepsis monitoring ?

## 2020-12-25 NOTE — Progress Notes (Addendum)
Pharmacy Antibiotic Note  Natalie Morrison is a 50 y.o. female admitted on 12/24/2020 with pneumonia and sepsis with perforated esophagus.  Pharmacy has been consulted for vancomycin and Zosyn dosing.  Plan: Rec'd vanc 1g, cefepime 2g, and azithro 500mg  in ED. Vancomycin 750mg  IV Q12H. Goal AUC 400-550.  Expected AUC 550.  SCr used 0.8.  Zosyn 3.375g IV q8h (4-hour infusion).  Height: 5\' 5"  (165.1 cm) Weight: 56.2 kg (124 lb) IBW/kg (Calculated) : 57  Temp (24hrs), Avg:97.9 F (36.6 C), Min:97.8 F (36.6 C), Max:98.2 F (36.8 C)  Recent Labs  Lab 12/24/20 1947 12/25/20 0113  WBC 33.7*  --   CREATININE 0.72  --   LATICACIDVEN  --  1.9    Estimated Creatinine Clearance: 74.6 mL/min (by C-G formula based on SCr of 0.72 mg/dL).    Allergies  Allergen Reactions  . Fentanyl Other (See Comments)    Prolonged effects  . Nitrofurantoin Hives  . Sulfa Antibiotics Hives  . Sulfonamide Derivatives Other (See Comments)    Unknown allergic reaction per husband     Thank you for allowing pharmacy to be a part of this patient's care.  Wynona Neat, PharmD, BCPS  12/25/2020 5:37 AM

## 2020-12-25 NOTE — ED Provider Notes (Signed)
D/w Dr. Elenor Quinones with Thoracic Surgery at [facility] He will accept in transfer    Ripley Fraise, MD 12/25/20 [phone]

## 2020-12-25 NOTE — ED Provider Notes (Signed)
BP 103/69   Pulse (!) 107   Temp 98 F (36.7 C)   Resp (!) 24   Ht 1.651 m (5\' 5" )   Wt 56.2 kg   SpO2 99%   BMI 20.63 kg/m  Pt appropriate for transfer to [facility] No distress noted   Ripley Fraise, MD 12/25/20 [phone]

## 2020-12-25 NOTE — ED Provider Notes (Signed)
CT imaging results noted.  Is reported patient had " frank" esophageal perforation.  Will consult her thoracic surgeon at [facility] Dr. Edison Nasuti, MD 12/25/20 [phone]

## 2020-12-30 LAB — CULTURE, BLOOD (ROUTINE X 2)
Culture: NO GROWTH
Special Requests: ADEQUATE

## 2021-01-08 NOTE — Progress Notes (Signed)
[place]   [phone] [phone]   Clinic Follow up Note   Patient Care Team: Eulas Post, MD as PCP - General Truitt Merle, MD as Consulting Physician (Hematology) Michael Boston, MD as Consulting Physician (General Surgery) Kyung Rudd, MD as Consulting Physician (Radiation Oncology) Mauri Pole, MD as Consulting Physician (Gastroenterology) Jackelyn Knife, MD as Rounding Team (Internal Medicine) Karie Mainland, RD as Dietitian (Nutrition)  Date of Service:  01/10/2021  CHIEF COMPLAINT: F/u of esophageal cancer  SUMMARY OF ONCOLOGIC HISTORY: Oncology History Overview Note  Cancer Staging Esophageal cancer [facility]) Staging form: Esophagus - Other Histologies, AJCC 8th Edition - Clinical stage from 02/11/2020: cTX, cN1, cM0 - Signed by Truitt Merle, MD on 02/14/2020    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 Biopsied. - Food in the middle third of the esophagus. Removal was successful.   02/08/2020 Initial Biopsy   Diagnosis 02/08/20 Esophagus, biopsy, mass - INVASIVE SQUAMOUS CELL CARCINOMA - SEE COMMENT Microscopic Comment Based on the biopsy, the carcinoma appears moderately differentiated. Dr. Silverio Decamp was paged on Feb 09, 2020. Dr. Jeannie Done reviewed the case and agrees with the above diagnosis.    02/11/2020 Cancer Staging   Staging form: Esophagus - Other Histologies, AJCC 8th Edition - Clinical stage from 02/11/2020: cTX, cN1, cM0 - Signed by Truitt Merle, MD on 02/14/2020   02/14/2020 Initial Diagnosis   Esophageal cancer ([place])   02/14/2020 Imaging   CT CAP w contrast  IMPRESSION: 1. Irregular wall thickening and an ill-defined mass involving the distal esophagus consistent with known esophageal cancer. 2. Mildly enlarged  lymph nodes lateral to the gastroesophageal junction and in the gastrohepatic ligament, suspicious for metastatic disease. 3. No evidence of distant metastatic disease. 4. 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 5. Aortic Atherosclerosis (ICD10-I70.0).   03/18/2020 - 04/22/2020 Chemotherapy   Concurrent chemoRT with weekly carboplatin and Taxol starting 03/18/20-04/22/20   03/18/2020 - 04/25/2020 Radiation Therapy   Concurrent chemo RT with weekly CT by Dr Lisbeth Renshaw starting 03/18/20-04/25/20   07/04/2020 Imaging   CT Chest At [facility]  IMPRESSION:  1. Decreased mid and distal esophageal wall thickening with decreased size  of small lymph node adjacent to the upper esophagus and just distal to the  diaphragmatic hiatus.  2. Left lower lobe 65mm pulmonary nodule is newly visualized and  indeterminate. Attention on follow-up.  3. Please see same-day CT of the abdomen and pelvis for findings below the  diaphragm.    07/04/2020 Imaging   CT AP at [facility] Impression:  1. Decreased size of multiple previously seen enlarged perigastric lymph  nodes. No new lymphadenopathy or evidence of visceral metastatic disease  within the abdomen.   2. Please see same day chest CT for findings above the diaphragm.   07/22/2020 Surgery   ESOPHAGECTOMY, TOTAL/NEAR TOTAL, WITH THORACOSCOPIC MOBILIZATION OF MEDIASTINAL ESOPHAGUS, WITH CERVICAL ESOPHAGOGASTROSTOMY by Dr M'Amico at [facility]    07/22/2020 Pathology Results   DIAGNOSIS    A. Pleura, biopsy:   Fibromuscular tissue, negative for carcinoma.   B. Stomach and esophagus, esophagogastrectomy:   Invasive squamous cell carcinoma, moderately differentiated, 4.5 cm. See synoptic report.   C. Left gastric lymph node, excision:   Six lymph nodes, negative for metastatic carcinoma (0/6).   D. Lesser curvature lymph node,  excision:   Five lymph nodes, negative for metastatic carcinoma (0/5).   E. Final esophageal margin,  excision:   Portion of stomach and esophagus, negative for carcinoma or dysplasia.   F. Final gastric margin, excision:   Portion of stomach, negative for carcinoma or dysplasia.     07/22/2020 Cancer Staging   Staging form: Esophagus - Other Histologies, AJCC 8th Edition - Pathologic stage from 07/22/2020: pT3, pN0, cM0, G2 - Signed by Truitt Merle, MD on 08/18/2020   07/27/2020 Imaging   CT Chest at [facility]  IMPRESSION:  1.  Post surgical appearance of interval esophagectomy with gastric  pull-through. Locules of free air adjacent to the anastomosis favor  postsurgical given proximal in the surgical site however if there is  suspicion of leak consider a swallow study.  2.  Small to moderate loculated right hydropneumothorax with locules of are  posteriorly. Of not right chest tube within the fissure while the pleural  fluid is more posterior.  3.  Moderate left pleural effusion.     11/22/2020 - 11/22/2020 Chemotherapy   Nivolumab monthly starting 11/22/20 for 1 year (first few treatment q2weeks), held after first dose due to multiple symptoms and do not plan to restart since PNA/lung abscess    12/10/2020 Imaging   CT AP at [facility] 12/10/20 Impression:   1. New hypoattenuation of the anterior left renal mid kidney. Findings  concerning for underlying metastatic disease or pyelonephritis. Recommend  correlation with urinalysis to exclude pyelonephritis.      CT Chest at [facility] 12/10/20  IMPRESSION:  1.  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 There are small  locules of medially-located pleural fluid with rim enhancement, in  additional to a rim enhancing loculated intrafissural pleural collection in  the right major fissure. These findings are raise suspicion for empyema.  2.  New subcarinal mediastinal rim enhancing lesion. This is most likely a  mediastinal fluid collection/abscess. An enlarged  necrotic lymph node  cannot be entirely exclude.  3.  New left lower lobe nodule, which raises concern for metastasis. A  secondary consideration would include an atypical infection such as a  fungal infection.  4.  Please see separately dictated CT abdomen pelvis report for findings  below the diaphragm.       CURRENT THERAPY:  Supportive Care and Observation  INTERVAL HISTORY:  MORIAH SHAWLEY is here for a follow up. She was last seen by me 12/19/20. She presents to the clinic alone. She notes she has went to [facility] and was seen to have fistula, abscess. She has drain tube in place and feeding tube in place. She has home care nurse. Her husband notes her feeding tube is cracked and leaking. Her husband notes she has bed sore. Her husband notes she was confused on dilaudid as inpatient and more recently with codeine pain medication. She has been weaning down on dose. She is tube feeding 743ml once a day over 18 hours at rate of 70. With the crack she cannot do more. She is not eating or drinking by mouth. She takes protein pack twice a day 1cc each. I reviewed medication list with him.    REVIEW OF SYSTEMS:   Constitutional: Denies fevers, chills or abnormal weight loss (+) Fatigue, weakness  Eyes: Denies blurriness of vision Ears, nose, mouth, throat, and face: Denies mucositis or sore throat Respiratory: Denies cough, dyspnea or wheezes Cardiovascular: Denies palpitation, chest  discomfort or lower extremity swelling Gastrointestinal:  Denies nausea, heartburn or change in bowel habits Skin: Denies abnormal skin rashes (+) Buttock bed sore  Lymphatics: Denies new lymphadenopathy or easy bruising Neurological:Denies numbness, tingling or new weaknesses (+) Confusion Behavioral/Psych: Mood is stable, no new changes  All other systems were reviewed with the patient and are negative.  MEDICAL HISTORY:  Past Medical History:  Diagnosis Date  . Alcohol abuse   . Anemia   . Anxiety   .  Anxiety and depression   . Asthma   . Depression   . Hepatitis A    "when I was a kid"  . Hypertension     SURGICAL HISTORY: Past Surgical History:  Procedure Laterality Date  . BREAST LUMPECTOMY Right   . GASTROSTOMY N/A 02/22/2020   Procedure: OPEN PLACEMENT JEJUNOSTOMY FEEDING TUBE;  Surgeon: Jesusita Oka, MD;  Location: [place];  Service: General;  Laterality: N/A;  . IR [place] DUODEN/JEJUNO TUBE PERCUT W/FLUORO  04/25/2020  . TONSILLECTOMY AND ADENOIDECTOMY Bilateral over 30 years ago  . UPPER GASTROINTESTINAL ENDOSCOPY      I have reviewed the social history and family history with the patient and they are unchanged from previous note.  ALLERGIES:  is allergic to fentanyl, nitrofurantoin, sulfa antibiotics, and sulfonamide derivatives.  MEDICATIONS:  Current Outpatient Medications  Medication Sig Dispense Refill  . acetaminophen (TYLENOL) 160 MG/5ML suspension Place 30.5 mLs into feeding tube every 6 (six) hours as needed for moderate pain or headache.    Marland Kitchen amoxicillin-clavulanate (AUGMENTIN) 875-125 MG tablet Take 1 tablet by mouth 2 (two) times daily. (Patient not taking: No sig reported) 14 tablet 0  . ciprofloxacin (CIPRO) 500 MG tablet Take 500 mg by mouth See admin instructions. Bid x 7 days (Patient not taking: Reported on 12/25/2020)    . Cyanocobalamin 1000 MCG/15ML LIQD Take 15 mLs (1,000 mcg total) by mouth daily. 450 mL 1  . fluticasone (FLONASE) 50 MCG/ACT nasal spray Place 1 spray into both nostrils at bedtime.    . gabapentin (NEURONTIN) 100 MG capsule Take 100 mg by mouth 3 (three) times daily.    Marland Kitchen ibuprofen (ADVIL) 200 MG tablet Take 400 mg by mouth every 6 (six) hours as needed for headache or moderate pain.    Marland Kitchen loratadine (CLARITIN) 10 MG tablet TAKE 1 TABLET BY MOUTH EVERY DAY (Patient taking differently: Take 10 mg by mouth at bedtime.) 30 tablet 2  . ondansetron (ZOFRAN) 8 MG tablet TAKE 1 TABLET 2 TIMES DAILY AS NEEDED FOR REFRACTORY NAUSEA/VOMITING.  START ON DAY 3 AFTER CHEMO (Patient taking differently: Take 8 mg by mouth 2 (two) times daily as needed for vomiting or nausea.) 30 tablet 1  . polyethylene glycol powder (GLYCOLAX/MIRALAX) 17 GM/SCOOP powder Take 17 g by mouth daily as needed for mild constipation.    . prochlorperazine (COMPAZINE) 10 MG tablet Take 1 tablet (10 mg total) by mouth every 6 (six) hours as needed for nausea or vomiting. 30 tablet 1   Current Facility-Administered Medications  Medication Dose Route Frequency Provider Last Rate Last Admin  . 0.9 %  sodium chloride infusion  500 mL Intravenous Once Nandigam, Venia Minks, MD       Facility-Administered Medications Ordered in Other Visits  Medication Dose Route Frequency Provider Last Rate Last Admin  . ondansetron (ZOFRAN) injection 8 mg  8 mg Intravenous Once Cira Rue K, NP      . promethazine (PHENERGAN) injection 25 mg  25 mg Intravenous Once [place],  Wilhemina Cash, NP      . sodium chloride flush (NS) 0.9 % injection 10 mL  10 mL Intracatheter Once PRN Alla Feeling, NP        PHYSICAL EXAMINATION: ECOG PERFORMANCE STATUS: 3 - Symptomatic, >50% confined to bed  Vitals:   01/10/21 1424  BP: 108/77  Pulse: 91  Resp: 17  Temp: (!) 97.5 F (36.4 C)  SpO2: 100%   Filed Weights   01/10/21 1424  Weight: 119 lb 1.6 oz (54 kg)    GENERAL:alert, no distress and comfortable SKIN: skin color, texture, turgor are normal, no rashes or significant lesions (+) small Mid to right upper buttock bed sore.  EYES: normal, Conjunctiva are pink and non-injected, sclera clear  NECK: supple, thyroid normal size, non-tender, without nodularity LYMPH:  no palpable lymphadenopathy in the cervical, axillary  LUNGS: clear to auscultation and percussion with normal breathing effort (+) Posterior pleurx in place HEART: regular rate & rhythm and no murmurs (+) minimal lower extremity edema ABDOMEN:abdomen soft, non-tender and normal bowel sounds (+) RLQ tenderness (+) Abdominal  hernia (+) Feeding tube place Musculoskeletal:no cyanosis of digits and no clubbing  NEURO: alert & oriented x 3 with fluent speech, no focal motor/sensory deficits  LABORATORY DATA:  I have reviewed the data as listed CBC Latest Ref Rng & Units 12/24/2020 12/06/2020 11/22/2020  WBC 4.0 - 10.5 K/uL 33.7(H) 14.4(H) 9.1  Hemoglobin 12.0 - 15.0 g/dL 12.8 12.3 12.0  Hematocrit 36.0 - 46.0 % 40.8 37.0 36.3  Platelets 150 - 400 K/uL 634(H) 454(H) 303     CMP Latest Ref Rng & Units 12/24/2020 12/06/2020 11/22/2020  Glucose 70 - 99 mg/dL 113(H) 105(H) 101(H)  BUN 6 - 20 mg/dL 13 7 10   Creatinine 0.44 - 1.00 mg/dL 0.72 0.61 0.60  Sodium 135 - 145 mmol/L 135 139 137  Potassium 3.5 - 5.1 mmol/L 3.7 3.3(L) 4.0  Chloride 98 - 111 mmol/L 98 100 106  CO2 22 - 32 mmol/L 19(L) 25 21(L)  Calcium 8.9 - 10.3 mg/dL 9.0 8.9 8.9  Total Protein 6.5 - 8.1 g/dL 7.3 7.2 6.7  Total Bilirubin 0.3 - 1.2 mg/dL 1.0 0.5 0.4  Alkaline Phos 38 - 126 U/L 165(H) 130(H) 155(H)  AST 15 - 41 U/L 24 13(L) 15  ALT 0 - 44 U/L 13 7 10       RADIOGRAPHIC STUDIES: I have personally reviewed the radiological images as listed and agreed with the findings in the report. No results found.   ASSESSMENT & PLAN:  Natalie Morrison is a 50 y.o. female with    1. Esophagealsquamous cell carcinoma,in midesophagus,ypT3N0M0 -She was diagnosed in 02/2020.EGD showeda partially obstructing tumor, biopsy confirmedinvasive squamous cells carcinoma. Imaging indicates mildly enlarged LNs lateral to the GEJ. -Her5/28/21 PET scan showedlocally advanced disease. -She completedconcurrent chemoRT with weeklycarboplatin and Taxol 03/18/20-04/25/20.  -She underwent esophagectomy at the [facility], 2021. Surgical path showed 4.5cm of residual disease, which means complete response to neoadjuvant treatment was not achieved. This was complete removed with surgery. She still has high risk of recurrence. -Based onrecently published data of  [place] 577trial,I started her on1 year adjuvantNivolumab on 11/22/20. Therapy has been held aft first dose, due to esophageal fistula.  I do not plan to restart. -During recent [place] hospital admission, she was found to have PNA of right lung with abscess and esophageal fistula. She was treated with esophageal stent, antibiotics, feeding tube, pleurx. She currently has poor performance due to the above. I  discussed she will need time to recover and heal.  -Given her CT AP from 12/10/20 from [place] was NED and her current poor performance status I do not recommend more adjuvant treatment at this time. I recommend she focus on recovery.  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 Her 12/10/20 CT CAP from [facility] showed esophageal right pleural fistula, and possible small abscess in the mediastinum and right pleural. CT scan also showed new left lower lobe nodule. She was seen to have PNA.  -She had esophageal stent placement for the fistula. She was treated with antibiotics and had pleurx placed.  -She notes no further drain output from this. I recommend she f/u with [facility] to get this removed.  -She also has feeding tube in place. She notes this tube has a crack in it. I will contact IR about this, although it was placed at [facility].  -She is currently tube feeing 739ml over 18 hours daily at a rate of 70. She uses protein pack 1cc twice a day. I discussed this is not enough especially liquid. I will refer her to dietician for nutrition management.  -She is getting help with home care nurse.  -For pain she has been taking codeine since hospital discharge. Her husband notes she has been intermittently confused from this.  She has been slowly decreasing dose to wean off, but if confusion continues, I recommend brain MRI.  -She has mid to right buttock bed sore. I recommend she reduce pressure and keep area clean and dry.  -She will continue to f/u with home care.    3.Excessive alcohol usage and heavysmokinghistory,alcohol Cessation  -She has quit smoking 2 years ago and stopped using [place] 1 year ago. She rarely drinks alcohol now and stopped since her pain, but was drinking for 50 years.   4. B12 Deficiency  -She has been on oral B12 but has not been able to swallow due #1. She has restarted oral B12 after esophageal surgery. If not enough, will start B12 injections.  5. Social and Acupuncturist  -She is married with no children. She has in-laws in town -[place] beenunemployed since late 2019, she was working in a bank. Her husband is a bar tender -She currently does not have insurance, but is looking to get coverage. -F/uwithfinancial advocate to help apply for grants and coverage. She was not eligible for medicaid.  -Will apply for drug replacement with [facility]   PLAN: -Refer back to dietician to review nutritional supplement   -will call R about cracked feeding tube and request exchange it they agree  -Continue with home care nurse  -she will f/u with Dr. Elenor Quinones at [facility]  -Patient will call me to schedule follow-up when she recovers  No problem-specific Assessment & Plan notes found for this encounter.   Orders Placed This Encounter  Procedures  . IR GJ Tube Change    Standing Status:   Future    Standing Expiration Date:   01/10/2022    Order Specific Question:   Reason for Exam (SYMPTOM  OR DIAGNOSIS REQUIRED)    Answer:   j tube top cracked and  leaking    Order Specific Question:   Is the patient pregnant?  Answer:   No    Order Specific Question:   Preferred Imaging Location?    Answer:   [facility]   All questions were answered. The patient knows to call  the clinic with any problems, questions or concerns. No barriers to learning was detected. The total time spent in the appointment was 30 minutes.     Truitt Merle, MD 01/10/2021   I, Joslyn Devon, am acting as scribe for Truitt Merle, MD.   I have reviewed the above documentation for accuracy and completeness, and I agree with the above.

## 2021-01-10 ENCOUNTER — Other Ambulatory Visit: Payer: Self-pay

## 2021-01-10 ENCOUNTER — Inpatient Hospital Stay: Payer: Self-pay

## 2021-01-10 ENCOUNTER — Inpatient Hospital Stay: Payer: Self-pay | Attending: Hematology | Admitting: Hematology

## 2021-01-10 VITALS — BP 108/77 | HR 91 | Temp 97.5°F | Resp 17 | Ht 65.0 in | Wt 119.1 lb

## 2021-01-10 DIAGNOSIS — E538 Deficiency of other specified B group vitamins: Secondary | ICD-10-CM | POA: Insufficient documentation

## 2021-01-10 DIAGNOSIS — L89319 Pressure ulcer of right buttock, unspecified stage: Secondary | ICD-10-CM | POA: Insufficient documentation

## 2021-01-10 DIAGNOSIS — C154 Malignant neoplasm of middle third of esophagus: Secondary | ICD-10-CM

## 2021-01-10 DIAGNOSIS — C155 Malignant neoplasm of lower third of esophagus: Secondary | ICD-10-CM | POA: Insufficient documentation

## 2021-01-10 DIAGNOSIS — Z87891 Personal history of nicotine dependence: Secondary | ICD-10-CM | POA: Insufficient documentation

## 2021-01-10 DIAGNOSIS — Z934 Other artificial openings of gastrointestinal tract status: Secondary | ICD-10-CM | POA: Insufficient documentation

## 2021-01-10 DIAGNOSIS — F1011 Alcohol abuse, in remission: Secondary | ICD-10-CM | POA: Insufficient documentation

## 2021-01-10 DIAGNOSIS — Z9221 Personal history of antineoplastic chemotherapy: Secondary | ICD-10-CM | POA: Insufficient documentation

## 2021-01-10 DIAGNOSIS — K9413 Enterostomy malfunction: Secondary | ICD-10-CM

## 2021-01-10 DIAGNOSIS — Z923 Personal history of irradiation: Secondary | ICD-10-CM | POA: Insufficient documentation

## 2021-01-10 NOTE — Progress Notes (Signed)
No lab draw from PICC 4/8 per Dr. Burr Medico.

## 2021-01-11 ENCOUNTER — Encounter: Payer: Self-pay | Admitting: Hematology

## 2021-01-15 ENCOUNTER — Other Ambulatory Visit: Payer: Self-pay | Admitting: Hematology

## 2021-01-15 ENCOUNTER — Ambulatory Visit (HOSPITAL_COMMUNITY)
Admission: RE | Admit: 2021-01-15 | Discharge: 2021-01-15 | Disposition: A | Discharge status: Home / Self Care | Payer: Self-pay | Source: Ambulatory Visit | Attending: Hematology | Admitting: Hematology

## 2021-01-15 ENCOUNTER — Inpatient Hospital Stay: Payer: Self-pay | Admitting: Dietician

## 2021-01-15 ENCOUNTER — Other Ambulatory Visit: Payer: Self-pay

## 2021-01-15 DIAGNOSIS — C154 Malignant neoplasm of middle third of esophagus: Secondary | ICD-10-CM

## 2021-01-15 DIAGNOSIS — K9413 Enterostomy malfunction: Secondary | ICD-10-CM

## 2021-01-15 HISTORY — PX: IR PATIENT EVAL TECH 0-60 MINS: IMG5564

## 2021-01-15 NOTE — Progress Notes (Signed)
Nutrition  Received message from Dr. Burr Medico with request to contact patient regarding J-tube feedings/fluid concerns. Patient is s/p esophagectomy and has completed chemotherapy and radiation treatments therapy for esophageal cancer. Patient seen by MD for follow-up 4/8. Per notes, patient is not eating or drinking by mouth. Reports giving 750 ml Nutren 1.5 @ 70 ml x 18 hours with 2 packets Prosource No Carb twice daily (1245 kcal, 81 gram protein, 573 ml free water from formula). Husband reports feeding tube is cracked and leaking, unable to increase tube feeds. MD has requested IR to evaluate tube. Briefly spoke with patient via phone this afternoon. Reports she is on her way to [facility] IR for tube exchange. Patient is agreeable to telephone follow-up on Friday, April 15 to discuss tube feedings.  Lajuan Lines, RD, LDN Clinical Nutrition [phone] (Cell)

## 2021-01-17 ENCOUNTER — Other Ambulatory Visit: Payer: Self-pay | Admitting: Hematology

## 2021-01-17 ENCOUNTER — Inpatient Hospital Stay: Payer: Self-pay | Admitting: Dietician

## 2021-01-17 DIAGNOSIS — K9413 Enterostomy malfunction: Secondary | ICD-10-CM

## 2021-01-17 DIAGNOSIS — C154 Malignant neoplasm of middle third of esophagus: Secondary | ICD-10-CM

## 2021-01-17 NOTE — Progress Notes (Signed)
Nutrition  Patient scheduled for nutrition follow-up via telephone this afternoon. Patient currently admitted to [facility]. Will reschedule follow-up as able.   Lajuan Lines, RD, [place] Clinical Nutrition Cell: [phone]

## 2021-01-20 IMAGING — CR DG ABDOMEN 2V
2 series · 2 of 2 positions shown · non-contrast
Comparison: Abdominal radiograph dated 03/05/2020. CT abdomen
pelvis dated 02/14/2020

CLINICAL DATA: 49-year-old female with history of esophageal cancer
presenting with abdominal pain and nausea.

EXAM:
ABDOMEN - 2 VIEW

[w abdomen upright *]
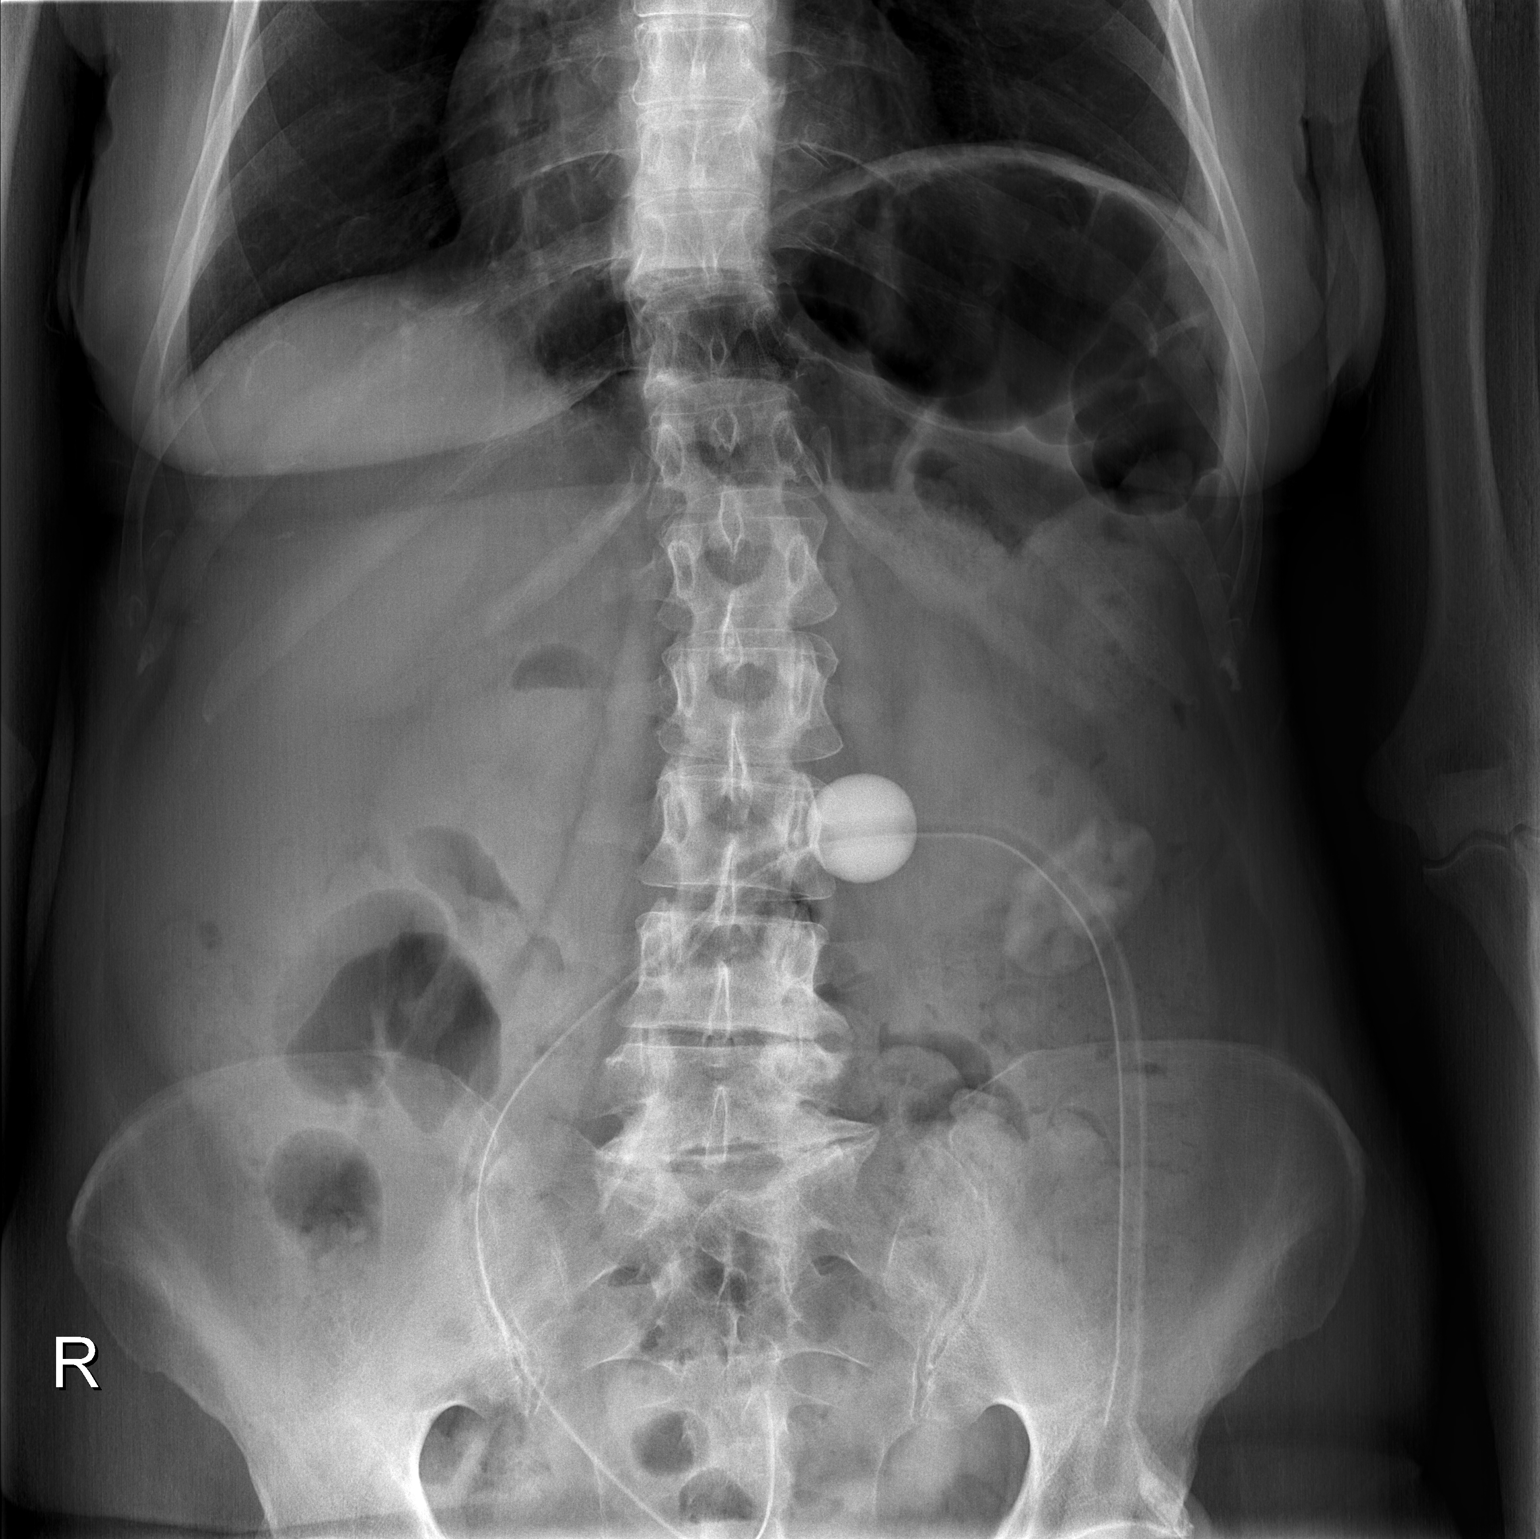

[t abdomen supine]
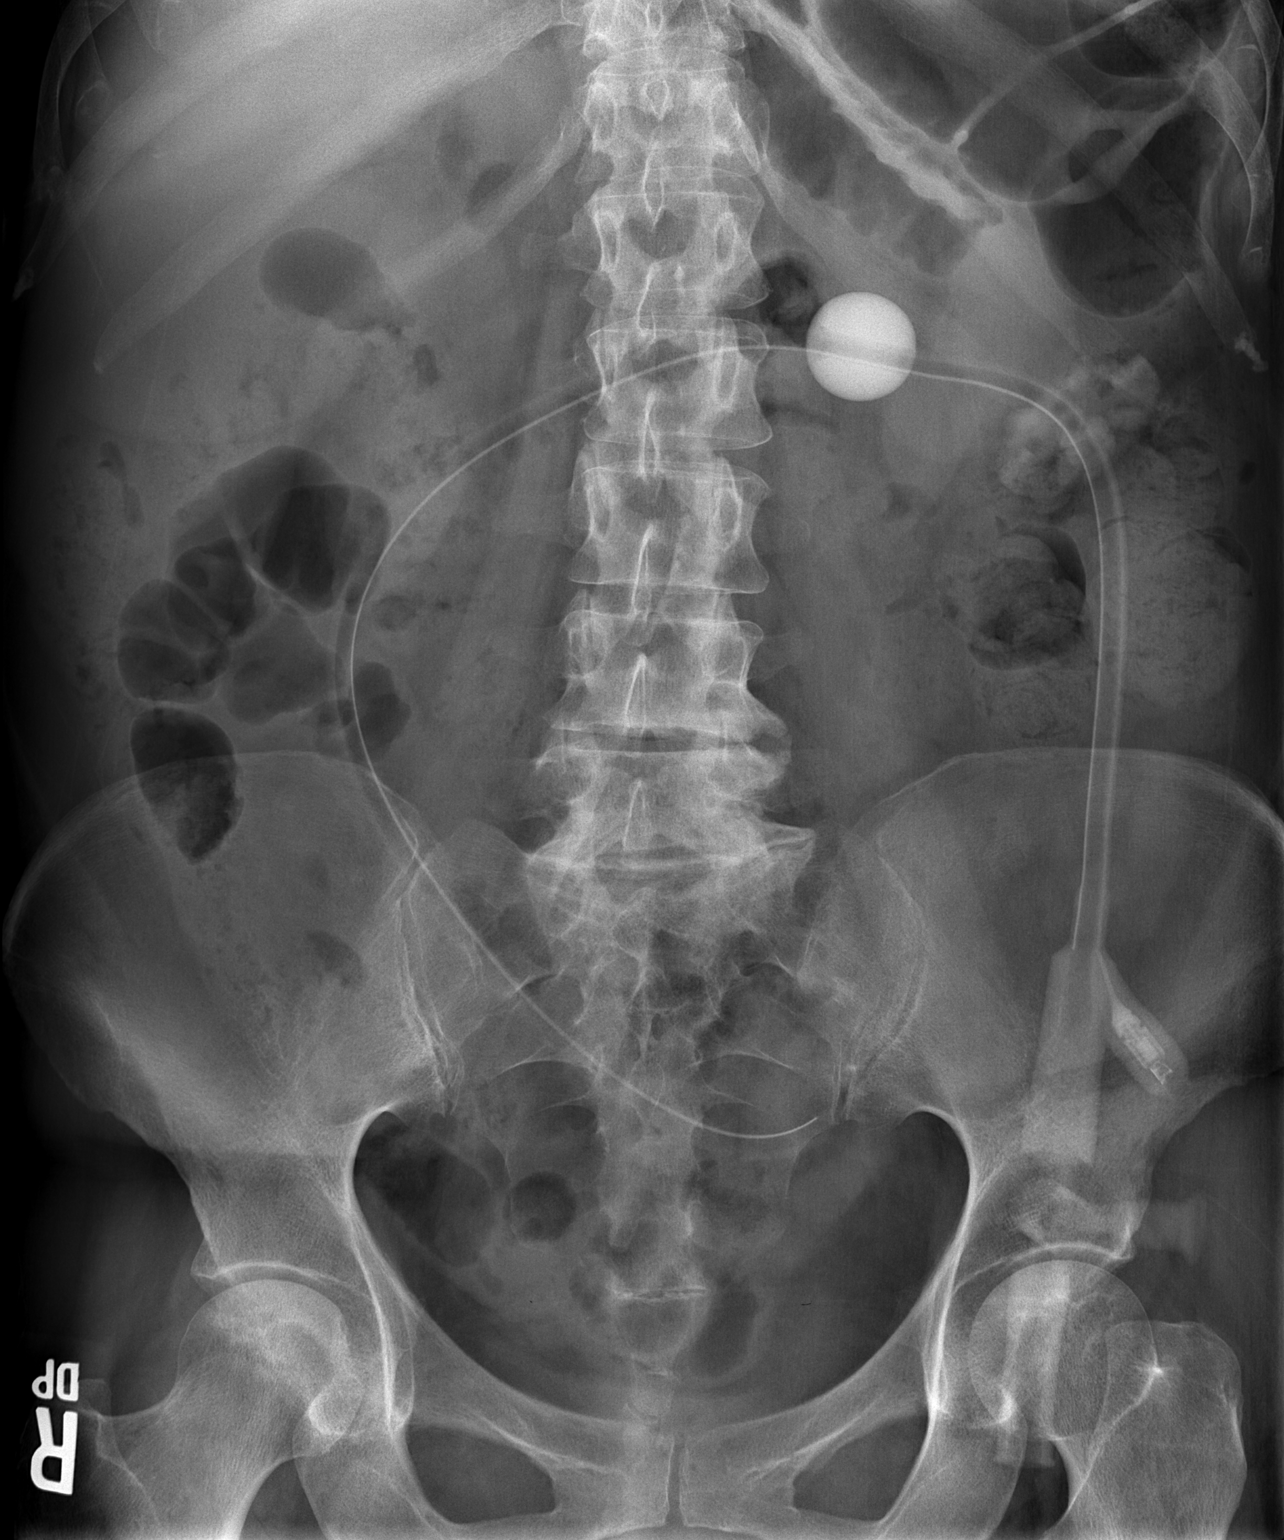

[2 of 2 positions shown; findings below may reference images not displayed]

FINDINGS: Percutaneous jejunostomy with balloon in the left upper abdomen and
tip in the lower abdomen. There is moderate stool throughout the
colon. No bowel dilatation or evidence of obstruction. No free air.
The osseous structures are intact.
IMPRESSION: No bowel obstruction.

## 2021-02-09 ENCOUNTER — Emergency Department (HOSPITAL_COMMUNITY): Payer: Self-pay

## 2021-02-09 ENCOUNTER — Other Ambulatory Visit: Payer: Self-pay

## 2021-02-09 ENCOUNTER — Encounter (HOSPITAL_COMMUNITY): Payer: Self-pay

## 2021-02-09 ENCOUNTER — Observation Stay (HOSPITAL_COMMUNITY)
Admission: EM | Admit: 2021-02-09 | Discharge: 2021-02-09 | Disposition: A | Discharge status: Home / Self Care | Payer: Self-pay | Attending: Emergency Medicine | Admitting: Emergency Medicine

## 2021-02-09 DIAGNOSIS — Z8501 Personal history of malignant neoplasm of esophagus: Secondary | ICD-10-CM | POA: Insufficient documentation

## 2021-02-09 DIAGNOSIS — J45909 Unspecified asthma, uncomplicated: Secondary | ICD-10-CM | POA: Insufficient documentation

## 2021-02-09 DIAGNOSIS — J9601 Acute respiratory failure with hypoxia: Principal | ICD-10-CM | POA: Insufficient documentation

## 2021-02-09 DIAGNOSIS — Z87891 Personal history of nicotine dependence: Secondary | ICD-10-CM | POA: Insufficient documentation

## 2021-02-09 DIAGNOSIS — I10 Essential (primary) hypertension: Secondary | ICD-10-CM | POA: Insufficient documentation

## 2021-02-09 DIAGNOSIS — Z20822 Contact with and (suspected) exposure to covid-19: Secondary | ICD-10-CM | POA: Insufficient documentation

## 2021-02-09 DIAGNOSIS — R0602 Shortness of breath: Secondary | ICD-10-CM | POA: Diagnosis present

## 2021-02-09 DIAGNOSIS — Z79899 Other long term (current) drug therapy: Secondary | ICD-10-CM | POA: Insufficient documentation

## 2021-02-09 LAB — CBC WITH DIFFERENTIAL/PLATELET
Abs Immature Granulocytes: 0.61 10*3/uL — ABNORMAL HIGH (ref 0.00–0.07)
Basophils Absolute: 0.1 10*3/uL (ref 0.0–0.1)
Basophils Relative: 1 %
Eosinophils Absolute: 0.4 10*3/uL (ref 0.0–0.5)
Eosinophils Relative: 1 %
HCT: 30 % — ABNORMAL LOW (ref 36.0–46.0)
Hemoglobin: 8.9 g/dL — ABNORMAL LOW (ref 12.0–15.0)
Immature Granulocytes: 2 %
Lymphocytes Relative: 9 %
Lymphs Abs: 2.6 10*3/uL (ref 0.7–4.0)
MCH: 30.8 pg (ref 26.0–34.0)
MCHC: 29.7 g/dL — ABNORMAL LOW (ref 30.0–36.0)
MCV: 103.8 fL — ABNORMAL HIGH (ref 80.0–100.0)
Monocytes Absolute: 3.7 10*3/uL — ABNORMAL HIGH (ref 0.1–1.0)
Monocytes Relative: 13 %
Neutro Abs: 21.3 10*3/uL — ABNORMAL HIGH (ref 1.7–7.7)
Neutrophils Relative %: 74 %
Platelets: 824 10*3/uL — ABNORMAL HIGH (ref 150–400)
RBC: 2.89 MIL/uL — ABNORMAL LOW (ref 3.87–5.11)
RDW: 14.2 % (ref 11.5–15.5)
WBC: 28.7 10*3/uL — ABNORMAL HIGH (ref 4.0–10.5)
nRBC: 0 % (ref 0.0–0.2)

## 2021-02-09 LAB — RESP PANEL BY RT-PCR (FLU A&B, COVID) ARPGX2
Influenza A by PCR: NEGATIVE
Influenza B by PCR: NEGATIVE
SARS Coronavirus 2 by RT PCR: NEGATIVE

## 2021-02-09 LAB — COMPREHENSIVE METABOLIC PANEL
ALT: 21 U/L (ref 0–44)
AST: 26 U/L (ref 15–41)
Albumin: 2.9 g/dL — ABNORMAL LOW (ref 3.5–5.0)
Alkaline Phosphatase: 106 U/L (ref 38–126)
Anion gap: 12 (ref 5–15)
BUN: 24 mg/dL — ABNORMAL HIGH (ref 6–20)
CO2: 25 mmol/L (ref 22–32)
Calcium: 9.2 mg/dL (ref 8.9–10.3)
Chloride: 100 mmol/L (ref 98–111)
Creatinine, Ser: 0.63 mg/dL (ref 0.44–1.00)
GFR, Estimated: >60 mL/min (ref >60)
Glucose, Bld: 203 mg/dL — ABNORMAL HIGH (ref 70–99)
Potassium: 3.9 mmol/L (ref 3.5–5.1)
Sodium: 137 mmol/L (ref 135–145)
Total Bilirubin: <0.1 mg/dL — ABNORMAL LOW (ref 0.3–1.2)
Total Protein: 7.4 g/dL (ref 6.5–8.1)

## 2021-02-09 LAB — LACTIC ACID, PLASMA: Lactic Acid, Venous: 3 mmol/L (ref 0.5–1.9)

## 2021-02-09 MED ORDER — ALBUTEROL SULFATE (2.5 MG/3ML) 0.083% IN NEBU
5.0000 mg | INHALATION_SOLUTION | Freq: Once | RESPIRATORY_TRACT | Status: AC
  Administered 2021-02-09: 5 mg via RESPIRATORY_TRACT
  Filled 2021-02-09: qty 6

## 2021-02-09 MED ORDER — SODIUM CHLORIDE 0.9% FLUSH: 10.0000 mL | Freq: Two times a day (BID) | INTRAVENOUS | Status: DC

## 2021-02-09 MED ORDER — HYDROMORPHONE HCL 1 MG/ML IJ SOLN
0.5000 mg | Freq: Once | INTRAMUSCULAR | Status: AC
  Administered 2021-02-09: 0.5 mg via INTRAVENOUS
  Filled 2021-02-09: qty 1

## 2021-02-09 MED ORDER — SODIUM CHLORIDE 0.9% FLUSH: 10.0000 mL | INTRAVENOUS | Status: DC | PRN

## 2021-02-09 MED ORDER — SODIUM CHLORIDE 0.9% FLUSH
10.0000 mL | Freq: Two times a day (BID) | INTRAVENOUS | Status: DC
  Administered 2021-02-09: 10 mL

## 2021-02-09 MED ORDER — VANCOMYCIN HCL 1000 MG/200ML IV SOLN
1000.0000 mg | Freq: Once | INTRAVENOUS | Status: AC
  Administered 2021-02-09: 1000 mg via INTRAVENOUS
  Filled 2021-02-09: qty 200

## 2021-02-09 MED ORDER — CHLORHEXIDINE GLUCONATE CLOTH 2 % EX PADS: 6.0000 | MEDICATED_PAD | Freq: Every day | CUTANEOUS | Status: DC

## 2021-02-09 MED ORDER — ALBUTEROL SULFATE HFA 108 (90 BASE) MCG/ACT IN AERS
6.0000 | INHALATION_SPRAY | RESPIRATORY_TRACT | Status: DC | PRN
  Administered 2021-02-09: 6 via RESPIRATORY_TRACT
  Filled 2021-02-09: qty 6.7

## 2021-02-09 MED ORDER — IOHEXOL 350 MG/ML SOLN
50.0000 mL | Freq: Once | INTRAVENOUS | Status: AC | PRN
  Administered 2021-02-09: 50 mL via INTRAVENOUS

## 2021-02-09 MED ORDER — SODIUM CHLORIDE 0.9 % IV SOLN
2.0000 g | Freq: Once | INTRAVENOUS | Status: AC
  Administered 2021-02-09: 2 g via INTRAVENOUS
  Filled 2021-02-09: qty 2

## 2021-02-09 NOTE — ED Notes (Signed)
Transfer center called back and stated pt will go to ED and not 6N

## 2021-02-09 NOTE — ED Provider Notes (Signed)
  Physical Exam  BP 125/88   Pulse 96   Temp 97.6 F (36.4 C) (Oral)   Resp (!) 24   Ht 1.651 m (5\' 5" )   Wt 54 kg   SpO2 100%   BMI 19.81 kg/m   Physical Exam  ED Course/Procedures     Procedures  MDM  50 year old female history of esophageal cancer and surgery seen and evaluated by Dr. Mariane Baumgarten, awaiting transport to [facility].  She came in last night due to dyspnea.  Initial sats were low but improved with nasal cannula.  Patient thought to have likely aspiration pneumonia.  She was given cefepime and vancomycin.  CTA performed and showed no evidence of PE.  She also had recently had endoscopy on Friday. She has had some recent increased dyspnea on 4 L nasal cannula.  Blood pressure is stable at 125/88 although some episodes of hypotension reported this has improved and stabilized without any acute intervention.  Heart rate is 96 oxygen rate is 100%.  Plan increase nasal cannula to 5 to 6 L/min to decrease work of breathing.  Patient has bed at [facility] we are awaiting transport.   Patient appears stable for transport   Pattricia Boss, MD 02/09/21 [phone]

## 2021-02-09 NOTE — ED Notes (Signed)
[facility] at bedside report given with copies of chart.

## 2021-02-09 NOTE — ED Notes (Addendum)
[facility] contacted for transport to [facility]-

## 2021-02-09 NOTE — ED Notes (Signed)
Pt placed on 3L [place] in room. O2 100%.

## 2021-02-09 NOTE — ED Notes (Signed)
[facility] transfer center called with bed assignment

## 2021-02-09 NOTE — ED Notes (Signed)
Pt continues with tachypnea, rhonchi and labored breathing. Pt states she feel like she can't breath and throat pain and soreness is increasing. Dr Jeanell Sparrow aware of patient complaint and assessment.

## 2021-02-09 NOTE — ED Notes (Signed)
Dr Jeanell Sparrow into room to assess pt. Pt with increasing work of breathing, tachypnea and pale. Rhonchi heard in all lung fields, per Dr Jeanell Sparrow increase o2 to 6L

## 2021-02-09 NOTE — ED Triage Notes (Signed)
Patient coming from home with husband, reports she had an EGD yesterday and has been having difficulty breathing, 84% on RA in triage, labored breathing.

## 2021-02-09 NOTE — ED Provider Notes (Signed)
[facility] EMERGENCY DEPARTMENT Provider Note   CSN: [id number] Arrival date & time: 02/09/21  0126     History Chief Complaint  Patient presents with  . Respiratory Distress    KERRI KOVACIK is a 50 y.o. female.  Patient presents to the emergency department for evaluation of difficulty breathing.  Patient reports that her breathing worsened around 8 PM tonight.  Recent medical history is [place] esophagectomy in October of last year secondary to esophageal cancer.  Course was complicated by empyema.  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  Patient was seen at [facility] yesterday for follow-up and had EGD with replacement of the stent.  Report of the EGD was that the defect was nearly healed.  She has been released for full regular diet.        Past Medical History:  Diagnosis Date  . Alcohol abuse   . Anemia   . Anxiety   . Anxiety and depression   . Asthma   . Depression   . Hepatitis A    "when I was a kid"  . Hypertension     Patient Active Problem List   Diagnosis Date Noted  . Shortness of breath 02/09/2021  . Esophageal perforation 12/25/2020  . Failure to thrive (0-17) 02/20/2020  . 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 Sepsis ([place]) 02/22/2018  . Pressure injury of skin 02/22/2018  . Airway intubation performed without difficulty   . Sepsis due to pneumonia ([place]) 01/27/2018  . Acute respiratory failure with hypoxia ([place]) 01/27/2018  . Hypokalemia 01/27/2018  . Prolonged QT interval 01/27/2018  . Acute respiratory failure with hypoxia and hypercapnia ([place]) 01/27/2018  . Hypomagnesemia 01/27/2018  . Community acquired pneumonia   . Hyponatremia 06/23/2017  . Pancreatitis 10/30/2016  . Acute pancreatitis 10/30/2016  . Tobacco abuse 10/30/2016  . Moderate malnutrition ([place]) 12/03/2015  . Hypotension 09/23/2015  . Acute kidney injury ([place])  09/23/2015  . Septic shock ([place]) 09/21/2015  . History of depression 06/14/2015  . Asthma exacerbation 04/07/2011  . HEMATURIA UNSPECIFIED 07/25/2009  . Alcohol abuse 06/26/2009  . ALLERGIC RHINITIS 06/26/2009  . Fatigue 06/26/2009  . Essential hypertension 06/28/2007    Past Surgical History:  Procedure Laterality Date  . BREAST LUMPECTOMY Right   . GASTROSTOMY N/A 02/22/2020   Procedure: OPEN PLACEMENT JEJUNOSTOMY FEEDING TUBE;  Surgeon: Jesusita Oka, MD;  Location: [facility];  Service: General;  Laterality: N/A;  . IR [place] DUODEN/JEJUNO TUBE PERCUT W/FLUORO  04/25/2020  . TONSILLECTOMY AND ADENOIDECTOMY Bilateral over 30 years ago  . UPPER GASTROINTESTINAL ENDOSCOPY       OB History   No obstetric history on file.     Family History  Problem Relation Age of Onset  . Heart disease Mother   . Liver disease Mother   . Heart disease Father   . Cancer Maternal Grandmother        unknown type cancer   . Colon cancer Neg Hx   . Esophageal cancer Neg Hx   . Rectal cancer Neg Hx   . Stomach cancer Neg Hx     Social History   Tobacco Use  . Smoking status: Former Smoker    Packs/day: 1.00    Years: 33.00    Pack years: 33.00    Types: Cigarettes    Quit date: 02/01/2018    Years since quitting: 3.0  . Smokeless tobacco: Never  Used  Vaping Use  . Vaping Use: Never used  Substance Use Topics  . Alcohol use: Yes    Alcohol/week: 2.0 - 3.0 standard drinks    Types: 2 - 3 Shots of liquor per week    Comment: 2-3 drinks per day for 30 years, plan to stop   . Drug use: Yes    Frequency: 1.0 times per week    Types: Marijuana    Comment: 10/30/2016 "weekly", quit in 2020     Home Medications Prior to Admission medications   Medication Sig Start Date End Date Taking? Authorizing Provider  acetaminophen (TYLENOL) 160 MG/5ML suspension Place 30.5 mLs into feeding tube every 6 (six) hours as needed for moderate pain or headache. 08/18/20   [provider]   amoxicillin-clavulanate (AUGMENTIN) 875-125 MG tablet Take 1 tablet by mouth 2 (two) times daily. Patient not taking: No sig reported 12/06/20   Truitt Merle, MD  ciprofloxacin (CIPRO) 500 MG tablet Take 500 mg by mouth See admin instructions. Bid x 7 days Patient not taking: Reported on 12/25/2020 12/10/20   [provider]  Cyanocobalamin 1000 MCG/15ML LIQD Take 15 mLs (1,000 mcg total) by mouth daily. 03/18/20   Alla Feeling, NP  fluticasone (FLONASE) 50 MCG/ACT nasal spray Place 1 spray into both nostrils at bedtime.    [provider]  gabapentin (NEURONTIN) 100 MG capsule Take 100 mg by mouth 3 (three) times daily. 12/10/20   [provider]  ibuprofen (ADVIL) 200 MG tablet Take 400 mg by mouth every 6 (six) hours as needed for headache or moderate pain.    [provider]  loratadine (CLARITIN) 10 MG tablet TAKE 1 TABLET BY MOUTH EVERY DAY Patient taking differently: Take 10 mg by mouth at bedtime. 06/26/20   Truitt Merle, MD  ondansetron (ZOFRAN) 8 MG tablet TAKE 1 TABLET 2 TIMES DAILY AS NEEDED FOR REFRACTORY NAUSEA/VOMITING. START ON DAY 3 AFTER CHEMO Patient taking differently: Take 8 mg by mouth 2 (two) times daily as needed for vomiting or nausea. 12/17/20   Truitt Merle, MD  polyethylene glycol powder (GLYCOLAX/MIRALAX) 17 GM/SCOOP powder Take 17 g by mouth daily as needed for mild constipation. 08/18/20   [provider]  prochlorperazine (COMPAZINE) 10 MG tablet Take 1 tablet (10 mg total) by mouth every 6 (six) hours as needed for nausea or vomiting. 11/27/20   Truitt Merle, MD    Allergies    Fentanyl, Nitrofurantoin, Sulfa antibiotics, and Sulfonamide derivatives  Review of Systems   Review of Systems  Respiratory: Positive for shortness of breath.   All other systems reviewed and are negative.   Physical Exam Updated Vital Signs BP 109/65   Pulse 87   Temp 97.6 F (36.4 C) (Oral)   Resp (!) 25   Ht 5\' 5"  (1.651 m)   Wt 54 kg   SpO2 100%    BMI 19.81 kg/m   Physical Exam Vitals and nursing note reviewed.  Constitutional:      General: She is in acute distress.     Appearance: Normal appearance. She is well-developed.  HENT:     Head: Normocephalic and atraumatic.     Right Ear: Hearing normal.     Left Ear: Hearing normal.     Nose: Nose normal.  Eyes:     Conjunctiva/sclera: Conjunctivae normal.     Pupils: Pupils are equal, round, and reactive to light.  Cardiovascular:     Rate and Rhythm: Regular rhythm. Tachycardia present.  Heart sounds: S1 normal and S2 normal. No murmur heard. No friction rub. No gallop.   Pulmonary:     Effort: Tachypnea, accessory muscle usage and respiratory distress present.     Breath sounds: Rales present.  Chest:     Chest wall: No tenderness.  Abdominal:     General: Bowel sounds are normal.     Palpations: Abdomen is soft.     Tenderness: There is no abdominal tenderness. There is no guarding or rebound. Negative signs include Murphy's sign and McBurney's sign.     Hernia: No hernia is present.  Musculoskeletal:        General: Normal range of motion.     Cervical back: Normal range of motion and neck supple.  Skin:    General: Skin is warm and dry.     Findings: No rash.  Neurological:     Mental Status: She is alert and oriented to person, place, and time.     GCS: GCS eye subscore is 4. GCS verbal subscore is 5. GCS motor subscore is 6.     Cranial Nerves: No cranial nerve deficit.     Sensory: No sensory deficit.     Coordination: Coordination normal.  Psychiatric:        Speech: Speech normal.        Behavior: Behavior normal.        Thought Content: Thought content normal.     ED Results / Procedures / Treatments   Labs (all labs ordered are listed, but only abnormal results are displayed) Labs Reviewed  CBC WITH DIFFERENTIAL/PLATELET - Abnormal; Notable for the following components:      Result Value   WBC 28.7 (*)    RBC 2.89 (*)    Hemoglobin 8.9  (*)    HCT 30.0 (*)    MCV 103.8 (*)    MCHC 29.7 (*)    Platelets 824 (*)    Neutro Abs 21.3 (*)    Monocytes Absolute 3.7 (*)    Abs Immature Granulocytes 0.61 (*)    All other components within normal limits  COMPREHENSIVE METABOLIC PANEL - Abnormal; Notable for the following components:   Glucose, Bld 203 (*)    BUN 24 (*)    Albumin 2.9 (*)    Total Bilirubin <0.1 (*)    All other components within normal limits  LACTIC ACID, PLASMA - Abnormal; Notable for the following components:   Lactic Acid, Venous 3.0 (*)    All other components within normal limits  RESP PANEL BY RT-PCR (FLU A&B, COVID) ARPGX2  CULTURE, BLOOD (ROUTINE X 2)  CULTURE, BLOOD (ROUTINE X 2)  PATHOLOGIST SMEAR REVIEW    EKG EKG Interpretation  Date/Time:  Sunday Feb 09 2021 01:53:35 EDT Ventricular Rate:  121 PR Interval:  168 QRS Duration: 83 QT Interval:  330 QTC Calculation: 469 R Axis:   34 Text Interpretation: Sinus tachycardia LAE, consider biatrial enlargement Abnormal R-wave progression, early transition Confirmed by Orpah Greek [phone]) on 02/09/2021 2:01:38 AM   Radiology CT ANGIO CHEST PE W OR WO CONTRAST  Result Date: 02/09/2021 CLINICAL DATA:  50 year old female with known esophageal cancer, esophagopleural fistula. Status post EGD yesterday with subsequent shortness of breath. Hypoxic on room air with labored breathing. EXAM: CT ANGIOGRAPHY CHEST WITH CONTRAST TECHNIQUE: Multidetector CT imaging of the chest was performed using the standard protocol during bolus administration of intravenous contrast. Multiplanar CT image reconstructions and MIPs were obtained to evaluate the vascular anatomy. CONTRAST:  36mL OMNIPAQUE IOHEXOL 350 MG/ML SOLN COMPARISON:  Portable chest 0154 hours.  Chest CT 12/25/2020 FINDINGS: Cardiovascular: Good contrast bolus timing in the pulmonary arterial tree. Respiratory motion in the lower lungs. But no focal filling defect identified in the pulmonary  arteries to suggest acute pulmonary embolism. No cardiomegaly or pericardial effusion. Negative visible aorta aside from mild calcified atherosclerosis. Mediastinum/Nodes: Esophageal cancer with proximal thoracic esophagus metallic stent stable and configuration from March. Opacified or occluded middle 3rd of the thoracic esophagus over a segment of about 5.5 cm as seen on series 13, image 76, secondary to nonspecific soft tissue or debris. Additional amorphous subcarinal soft tissue appears mildly increased since that time (series 9, image 158) and is contiguous with increased soft tissue at both inferior hila. Lungs/Pleura: Small layering left pleural effusion has increased since March. Small or trace right pleural effusion is stable. Mildly increased mass effect on the carina since March, and increased mass effect on and narrowing of the right mainstem bronchus. Compare series 8, image 44 today to series 4, image 47 previously. Continued consolidation in the right lower lobe superior segment although the previously seen rim enhancing fluid collection there has regressed. There remains a small area of cavitation suspected on series 8, image 50. Posterior right upper lobe ventilation has improved, with regressed airspace opacity there since March. Chronic pleural thickening and pleural space gas in the medial right upper lung adjacent to the mediastinum has not significantly changed. The bronchus intermedius appears more encased and increasingly narrow it on series 8, image 58. but right middle lobe and right lower lobe ventilation has not significantly changed since March. The left mainstem bronchus also appears attenuated today on series 8, image 56. Increased left lower lobe atelectasis secondary to the pleural effusion. And in the left lower lobe on series 8, image 79 a 1.9 cm lung nodule has increased from 1.5 cm in March. Scattered left lingula and lower lobe airspace disease has regressed but not resolved.  Upper Abdomen: Stable gastroesophageal junction. Unremarkable visible bowel in the upper abdomen. Negative visible liver, spleen and left adrenal gland. Musculoskeletal: No acute or suspicious osseous lesion identified in the chest. Review of the MIP images confirms the above findings. IMPRESSION: 1. Negative for acute pulmonary embolus. 2. Esophageal cancer with stable proximal esophagus metal stent. But opacified or occluded middle 3rd of the thoracic esophagus over a segment of 5.5 cm (series 13, image 76), possibly due to tumor progression. Correlate with EGD findings yesterday. 3. Increased subcarinal and hilar soft tissue also suggests disease progression with new narrowing of the bilateral mainstem bronchi and the right bronchus intermedius. 4. But right lung ventilation has mildly improved since March. Continued consolidation of the superior segment of the right lower lobe, but a pulmonary abscess there has regressed. 5. Left lung ventilation has also mildly improved although a small layering left pleural effusion has increased. 6. Increased left lower lobe pulmonary nodule since March (series 8, image 79) suspicious for progressed pulmonary metastasis. Electronically Signed   By: Genevie Ann M.D.   On: 02/09/2021 04:41   DG Chest Port 1 View  Addendum Date: 02/09/2021   ADDENDUM REPORT: 02/09/2021 02:38 ADDENDUM: Please note the tip of the PICC is approximately 2 cm distal to the cavoatrial junction. Electronically Signed   By: Anner Crete M.D.   On: 02/09/2021 02:38   Result Date: 02/09/2021 CLINICAL DATA:  50 year old female with shortness of breath. EXAM: PORTABLE CHEST 1 VIEW COMPARISON:  Chest radiograph dated  12/24/2020 and CT dated 12/25/2020. FINDINGS: Right-sided PICC with tip over the right atrium. Interval decrease in the right suprahilar and perihilar density extending along the minor fissure compared to prior radiograph. Residual left lung base densities improved since the prior  radiograph. No pneumothorax. Similar positioning of the esophageal stent. Stable cardiomediastinal silhouette. No acute osseous pathology. IMPRESSION: Interval improvement in the right perihilar densities and left lung base densities since the prior radiograph. Electronically Signed: By: Anner Crete M.D. On: 02/09/2021 02:15    Procedures Procedures   Medications Ordered in ED Medications  sodium chloride flush (NS) 0.9 % injection 10-40 mL (10 mLs Intracatheter Given 02/09/21 0615)  sodium chloride flush (NS) 0.9 % injection 10-40 mL (has no administration in time range)  Chlorhexidine Gluconate Cloth 2 % PADS 6 each (has no administration in time range)  albuterol (VENTOLIN HFA) 108 (90 Base) MCG/ACT inhaler 6 puff (6 puffs Inhalation Given 02/09/21 0301)  sodium chloride flush (NS) 0.9 % injection 10-40 mL (has no administration in time range)  sodium chloride flush (NS) 0.9 % injection 10-40 mL (has no administration in time range)  albuterol (PROVENTIL) (2.5 MG/3ML) 0.083% nebulizer solution 5 mg (has no administration in time range)  HYDROmorphone (DILAUDID) injection 0.5 mg (0.5 mg Intravenous Given 02/09/21 0301)  vancomycin (VANCOREADY) IVPB 1000 mg/200 mL (0 mg Intravenous Stopped 02/09/21 0552)  ceFEPIme (MAXIPIME) 2 g in sodium chloride 0.9 % 100 mL IVPB (0 g Intravenous Stopped 02/09/21 0445)  iohexol (OMNIPAQUE) 350 MG/ML injection 50 mL (50 mLs Intravenous Contrast Given 02/09/21 0414)  HYDROmorphone (DILAUDID) injection 0.5 mg (0.5 mg Intravenous Given 02/09/21 [id number])    ED Course  I have reviewed the triage vital signs and the nursing notes.  Pertinent labs & imaging results that were available during my care of the patient were reviewed by me and considered in my medical decision making (see chart for details).    MDM Rules/Calculators/A&P                          Patient presents to the emergency department for evaluation of difficulty breathing.  Patient in moderate  respiratory distress at arrival.  She was tachycardic, tachypneic, hypertensive with oxygen saturations of 84%.  This improved with nasal cannula oxygen.  Initial auscultation revealed diffuse rales and tachypnea.  This raise concern for possible aspiration.  Chest x-ray did not show an obvious abnormality.  Patient underwent CT PE study which did not show any evidence of PE or any significant lung findings to explain the level of hypoxia.  Patient now having some mild wheezing, she improved initially with albuterol by inhaler.  Now that she is COVID-negative, will give albuterol nebulizer treatment.  Patient given broad-spectrum antibiotic coverage.  Discussed with hospitalist service for possible admission.  There is concern over complications of her esophagectomy.  Specifically radiology is concerned about possible opacification of the esophagus.  This could be an explanation for aspiration, however she seems to be handling her secretions without difficulty.  Based on this, did discuss with Dr. Williemae Natter, on-call for cardiothoracic at [facility].  Will be accepted for transfer.  CRITICAL CARE Performed by: Orpah Greek   Total critical care time: 35 minutes  Critical care time was exclusive of separately billable procedures and treating other patients.  Critical care was necessary to treat or prevent imminent or life-threatening deterioration.  Critical care was time spent personally by me on the following activities: development of treatment  plan with patient and/or surrogate as well as nursing, discussions with consultants, evaluation of patient's response to treatment, examination of patient, obtaining history from patient or surrogate, ordering and performing treatments and interventions, ordering and review of laboratory studies, ordering and review of radiographic studies, pulse oximetry and re-evaluation of patient's condition.  Final Clinical Impression(s) / ED Diagnoses Final diagnoses:   Acute respiratory failure with hypoxia [facility])    Rx / DC Orders ED Discharge Orders    None       Omeed Osuna, Gwenyth Allegra, MD 02/09/21 [phone]

## 2021-02-10 LAB — PATHOLOGIST SMEAR REVIEW

## 2021-02-14 LAB — CULTURE, BLOOD (ROUTINE X 2)
Culture: NO GROWTH
Culture: NO GROWTH
Special Requests: ADEQUATE
Special Requests: ADEQUATE

## 2021-03-06 ENCOUNTER — Encounter: Payer: Self-pay | Admitting: Radiology

## 2021-04-04 DEATH — deceased

## 2021-08-06 NOTE — Progress Notes (Signed)
..  Patient Assist/Replace for the following has been terminated. Medication: Opdivo (nivolumab) Reason for Termination: No active treatment since 11/22/2020 and future appointments scheduled at this time. Last DOS: 11/22/2020. Marland KitchenJuan Quam, CPhT IV Drug Replacement Specialist [place] Phone: [phone]

## 2022-08-10 ENCOUNTER — Other Ambulatory Visit: Payer: Self-pay | Admitting: Hematology
